# Patient Record
Sex: Male | Born: 1935 | Race: White | Hispanic: No | Marital: Married | Smoking: Former smoker
Health system: Southern US, Community
[De-identification: ages and names within clinical notes are randomized; demographics above are authoritative.]

## PROBLEM LIST (undated history)

## (undated) DIAGNOSIS — C801 Malignant (primary) neoplasm, unspecified: Secondary | ICD-10-CM

## (undated) DIAGNOSIS — K2270 Barrett's esophagus without dysplasia: Secondary | ICD-10-CM

## (undated) DIAGNOSIS — M1990 Unspecified osteoarthritis, unspecified site: Secondary | ICD-10-CM

## (undated) DIAGNOSIS — C61 Malignant neoplasm of prostate: Secondary | ICD-10-CM

## (undated) DIAGNOSIS — E119 Type 2 diabetes mellitus without complications: Secondary | ICD-10-CM

## (undated) DIAGNOSIS — Z9289 Personal history of other medical treatment: Secondary | ICD-10-CM

## (undated) DIAGNOSIS — Z8719 Personal history of other diseases of the digestive system: Secondary | ICD-10-CM

## (undated) DIAGNOSIS — Z8711 Personal history of peptic ulcer disease: Secondary | ICD-10-CM

## (undated) DIAGNOSIS — I10 Essential (primary) hypertension: Secondary | ICD-10-CM

## (undated) DIAGNOSIS — K219 Gastro-esophageal reflux disease without esophagitis: Secondary | ICD-10-CM

## (undated) DIAGNOSIS — Z87442 Personal history of urinary calculi: Secondary | ICD-10-CM

## (undated) DIAGNOSIS — Z950 Presence of cardiac pacemaker: Secondary | ICD-10-CM

## (undated) DIAGNOSIS — I2510 Atherosclerotic heart disease of native coronary artery without angina pectoris: Secondary | ICD-10-CM

## (undated) DIAGNOSIS — Z9889 Other specified postprocedural states: Secondary | ICD-10-CM

## (undated) DIAGNOSIS — I4891 Unspecified atrial fibrillation: Secondary | ICD-10-CM

## (undated) HISTORY — PX: TONSILLECTOMY: SUR1361

## (undated) HISTORY — PX: APPENDECTOMY: SHX54

## (undated) HISTORY — DX: Other specified postprocedural states: Z98.890

## (undated) HISTORY — PX: PACEMAKER INSERTION: SHX728

## (undated) HISTORY — DX: Personal history of other medical treatment: Z92.89

---

## 2001-10-20 ENCOUNTER — Ambulatory Visit (HOSPITAL_COMMUNITY): Admission: RE | Admit: 2001-10-20 | Discharge: 2001-10-20 | Payer: Self-pay | Admitting: Family Medicine

## 2001-12-05 ENCOUNTER — Ambulatory Visit (HOSPITAL_COMMUNITY): Admission: RE | Admit: 2001-12-05 | Discharge: 2001-12-05 | Payer: Self-pay | Admitting: Internal Medicine

## 2002-03-31 ENCOUNTER — Other Ambulatory Visit: Admission: RE | Admit: 2002-03-31 | Discharge: 2002-03-31 | Payer: Self-pay | Admitting: General Surgery

## 2004-01-03 ENCOUNTER — Ambulatory Visit (HOSPITAL_COMMUNITY): Admission: RE | Admit: 2004-01-03 | Discharge: 2004-01-03 | Payer: Self-pay | Admitting: Internal Medicine

## 2004-10-04 ENCOUNTER — Ambulatory Visit (HOSPITAL_COMMUNITY): Admission: RE | Admit: 2004-10-04 | Discharge: 2004-10-04 | Payer: Self-pay | Admitting: Family Medicine

## 2006-01-28 ENCOUNTER — Ambulatory Visit: Payer: Self-pay | Admitting: Internal Medicine

## 2006-02-18 ENCOUNTER — Ambulatory Visit (HOSPITAL_COMMUNITY): Admission: RE | Admit: 2006-02-18 | Discharge: 2006-02-20 | Payer: Self-pay | Admitting: Cardiology

## 2006-02-18 HISTORY — PX: CARDIAC CATHETERIZATION: SHX172

## 2006-12-17 ENCOUNTER — Ambulatory Visit (HOSPITAL_COMMUNITY): Admission: RE | Admit: 2006-12-17 | Discharge: 2006-12-17 | Payer: Self-pay | Admitting: Family Medicine

## 2007-02-13 ENCOUNTER — Ambulatory Visit: Payer: Self-pay | Admitting: Internal Medicine

## 2007-02-28 ENCOUNTER — Encounter (INDEPENDENT_AMBULATORY_CARE_PROVIDER_SITE_OTHER): Payer: Self-pay | Admitting: Specialist

## 2007-02-28 ENCOUNTER — Ambulatory Visit: Payer: Self-pay | Admitting: Internal Medicine

## 2007-02-28 ENCOUNTER — Ambulatory Visit (HOSPITAL_COMMUNITY): Admission: RE | Admit: 2007-02-28 | Discharge: 2007-02-28 | Payer: Self-pay | Admitting: Internal Medicine

## 2007-12-12 ENCOUNTER — Emergency Department (HOSPITAL_COMMUNITY): Admission: EM | Admit: 2007-12-12 | Discharge: 2007-12-12 | Payer: Self-pay | Admitting: Emergency Medicine

## 2007-12-13 ENCOUNTER — Ambulatory Visit (HOSPITAL_COMMUNITY): Admission: RE | Admit: 2007-12-13 | Discharge: 2007-12-13 | Payer: Self-pay | Admitting: Emergency Medicine

## 2009-02-28 ENCOUNTER — Ambulatory Visit (HOSPITAL_COMMUNITY): Admission: RE | Admit: 2009-02-28 | Discharge: 2009-02-28 | Payer: Self-pay | Admitting: Family Medicine

## 2010-03-21 ENCOUNTER — Emergency Department (HOSPITAL_COMMUNITY): Admission: EM | Admit: 2010-03-21 | Discharge: 2010-03-21 | Payer: Self-pay | Admitting: Emergency Medicine

## 2010-05-01 ENCOUNTER — Ambulatory Visit (HOSPITAL_COMMUNITY): Admission: RE | Admit: 2010-05-01 | Discharge: 2010-05-01 | Payer: Self-pay | Admitting: Cardiology

## 2010-05-03 ENCOUNTER — Ambulatory Visit (HOSPITAL_COMMUNITY): Admission: RE | Admit: 2010-05-03 | Discharge: 2010-05-03 | Payer: Self-pay | Admitting: Cardiology

## 2010-05-03 ENCOUNTER — Encounter (INDEPENDENT_AMBULATORY_CARE_PROVIDER_SITE_OTHER): Payer: Self-pay | Admitting: Cardiology

## 2010-05-25 ENCOUNTER — Encounter (INDEPENDENT_AMBULATORY_CARE_PROVIDER_SITE_OTHER): Payer: Self-pay | Admitting: Cardiology

## 2010-05-25 ENCOUNTER — Ambulatory Visit (HOSPITAL_COMMUNITY): Admission: RE | Admit: 2010-05-25 | Discharge: 2010-05-25 | Payer: Self-pay | Admitting: Cardiology

## 2010-09-05 ENCOUNTER — Ambulatory Visit (HOSPITAL_COMMUNITY): Admission: RE | Admit: 2010-09-05 | Discharge: 2010-09-05 | Payer: Self-pay | Admitting: Cardiology

## 2010-09-05 ENCOUNTER — Encounter (INDEPENDENT_AMBULATORY_CARE_PROVIDER_SITE_OTHER): Payer: Self-pay | Admitting: Cardiology

## 2010-09-05 DIAGNOSIS — Z9289 Personal history of other medical treatment: Secondary | ICD-10-CM

## 2010-09-05 HISTORY — DX: Personal history of other medical treatment: Z92.89

## 2011-01-16 LAB — PROTIME-INR
INR: 2.06 — ABNORMAL HIGH (ref 0.00–1.49)
Prothrombin Time: 23.4 seconds — ABNORMAL HIGH (ref 11.6–15.2)

## 2011-01-22 LAB — DIGOXIN LEVEL: Digoxin Level: 0.5 ng/mL — ABNORMAL LOW (ref 0.8–2.0)

## 2011-01-22 LAB — CBC
HCT: 43.7 % (ref 39.0–52.0)
Hemoglobin: 15.1 g/dL (ref 13.0–17.0)
MCHC: 34.5 g/dL (ref 30.0–36.0)
MCV: 89.5 fL (ref 78.0–100.0)
Platelets: 159 10*3/uL (ref 150–400)
RBC: 4.88 MIL/uL (ref 4.22–5.81)
RDW: 13.7 % (ref 11.5–15.5)
WBC: 10.1 10*3/uL (ref 4.0–10.5)

## 2011-01-22 LAB — DIFFERENTIAL
Basophils Absolute: 0 10*3/uL (ref 0.0–0.1)
Basophils Relative: 0 % (ref 0–1)
Eosinophils Absolute: 0.1 10*3/uL (ref 0.0–0.7)
Eosinophils Relative: 1 % (ref 0–5)
Lymphocytes Relative: 16 % (ref 12–46)
Lymphs Abs: 1.7 10*3/uL (ref 0.7–4.0)
Monocytes Absolute: 0.6 10*3/uL (ref 0.1–1.0)
Monocytes Relative: 6 % (ref 3–12)
Neutro Abs: 7.7 10*3/uL (ref 1.7–7.7)
Neutrophils Relative %: 76 % (ref 43–77)

## 2011-01-22 LAB — GLUCOSE, CAPILLARY: Glucose-Capillary: 138 mg/dL — ABNORMAL HIGH (ref 70–99)

## 2011-01-22 LAB — BASIC METABOLIC PANEL
BUN: 19 mg/dL (ref 6–23)
CO2: 29 mEq/L (ref 19–32)
Calcium: 9 mg/dL (ref 8.4–10.5)
Chloride: 104 mEq/L (ref 96–112)
Creatinine, Ser: 1.2 mg/dL (ref 0.4–1.5)
GFR calc Af Amer: >60 mL/min (ref >60)
GFR calc non Af Amer: 59 mL/min — ABNORMAL LOW (ref >60)
Glucose, Bld: 121 mg/dL — ABNORMAL HIGH (ref 70–99)
Potassium: 4 mEq/L (ref 3.5–5.1)
Sodium: 139 mEq/L (ref 135–145)

## 2011-03-23 NOTE — Op Note (Signed)
NAME:  [name], [name]               ACCOUNT NO.:  [id number]   MEDICAL RECORD NO.:  [id number]          PATIENT TYPE:  OIB   LOCATION:  6532                         FACILITY:  [facility]   PHYSICIAN:  Richard A. Alanda Amass, M.D.DATE OF BIRTH:  11/27/1933   DATE OF PROCEDURE:  02/19/2006  DATE OF DISCHARGE:                                 OPERATIVE REPORT   PROCEDURE:  Implantation of permanent DDDR A-V Universal Adapta, ADDR01; SN  [id number] pulse generator was steroid eluting passive fixation tine, in-  line coaxial bipolar IS1 Medtronic atrial and ventricular electrodes.   IMPLANTING PHYSICIAN:  Richard A. Alanda Amass, M.D.   COMPLICATIONS:  None.   ANESTHESIA:  5 mg Valium p.o. premedication, 1% local Xylocaine, Versed 3 mg  IV in divided doses, fentanyl 25 mcg IV during procedure.   ESTIMATED BLOOD LOSS:  Approximately 30 mL.   PREOP ANTIBIOTIC PROPHYLAXIS:  Ancef 1 g IV to be continued postoperatively.   Ventricular electrode:  Medtronic number [id number] - 58 cm;  SN [id number]   Atrial electrode:  Medtronic number [id number] - 53 cm; SN  [id number].   PREOPERATIVE DIAGNOSIS:  1.  Sick sinus syndrome - paroxysmal atrial fibrillation with rapid      ventricular response requiring medical therapy.  2.  Symptomatic bradycardia with documented sinus bradycardia and 30s      episodic dizziness.  Episodic dizziness and lightheadedness.  3.  Systemic hypertension.  4.  GERD.  5.  Erectile dysfunction in past on medical therapy.  6.  Essentially normal coronary arteries with minor 30% LAD lesion at normal      LV function.  EF 65% at catheterization 02/18/2006 (see report).   POSTOPERATIVE DIAGNOSIS:  1.  Sick sinus syndrome - paroxysmal atrial fibrillation with rapid      ventricular response requiring medical therapy.  2.  Symptomatic bradycardia with documented sinus bradycardia and 30s      episodic dizziness.  Episodic dizziness and lightheadedness.  3.  Systemic hypertension.  4.   GERD.  5.  Erectile dysfunction in past on medical therapy.  6.  Essentially normal coronary arteries with minor 30% LAD lesion at normal      LV function.  EF 65% at catheterization 02/18/2006 (see report).   The bipolar atrial threshold:  P equal 5.1 mV, impedance 556 ohms, threshold  0.5 V at 0.5 milliseconds.   Unipolar atrial:  P equal 5.8 mV, impedance 430 ohms, threshold 0.3 V at 0.5  milliseconds.   Bipolar ventricular:  R equal 21 mV, impedance 727 ohms, threshold 0.5 V at  0.5 milliseconds.   Unipolar ventricular:  R equal 12.3 mV, impedance of 578 ohms, threshold 0.2  V at 0.5 milliseconds.   This generator has managed ventricular pacing with switching from a AAI to  DDDR.  In order to avoid pacer induced LBBB and preserved intact AV  conduction.   Magnet rate at __________ equals 85, at EOI magnet rate drops to 65 with  reversion to VVI mode.   PROCEDURE:  The patient brought to second floor CP lab in postabsorptive  state after 5 mg  Valium p.o. premedication.  He was given 1 g Ancef IV as  antibiotic prophylaxis which will be continued postoperative.  Informed  consent was obtained to proceed with pacemaker implant after full discussion  of risks, benefits and alternatives with the patient and his son and  granddaughter.  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  He is right-handed.   The left anterior chest was prepped, draped in the usual manner. 1%  Xylocaine was used for local anesthesia. A left infraclavicular curvilinear  transverse incision was performed and brought down to prepectoral fascia  using electrocautery and blunt dissection. control of hemostasis.  Post  generator pocket was then formed using blunt dissection.  Subclavian vein  was entered with single anterior puncture using 18 thin-wall needle in the  extrathoracic portion under fluoroscopic control.  Using two #9 peel-away  Cook introducers with a retained  stainless steel J-tip guidewire, the  electrodes were inserted in tandem and the sheath peeled away.  The RV  electrode was positioned in the RV apex inferiorly and was stable.  The  atrial electrode was positioned in the right atrial appendage confirmed by  fluoroscopy and rotational maneuvers.  The unipolar bipolar threshold  testing was performed.  The electrodes was secured at insertion site with  previously placed #1 figure-of-eight silk suture around tissue sewing collar  to prevent migration and control hemostasis.  They were further secured with  three #1 interrupted silk sutures around Silastic sewing collar for each  electrode.  Pocket was irrigated with 500 mg Kanamycin solution.  The sponge  count was correct.  Generator was hooked to the electrodes in the proper AV  sequence identified by serial numbers with a single hex nut tightened for  each electrode.  Generator was delivered at the pocket with electrodes  looped behind and loosely secured at the underlying muscle fascia with #1  silk suture to prevent migration.  Subcutaneous tissue closed with two  separate running layers of 2-0 Vicryl suture and skin was closed 5-0  subcuticular Vicryl suture.  The  patient was transferred to holding area in stable condition.  He tolerated  procedure well and remained in sinus that throughout the procedure with  noninvasive blood pressure monitoring and blood pressures 120s to 130s  systolic throughout. See program sheet for postoperative programming.      Richard A. Alanda Amass, M.D.  Electronically Signed     RAW/MEDQ  D:  02/19/2006  T:  02/20/2006  Job:  [id number]   cc:   Madaline Savage, M.D.  Fax: [phone]   Patrica Duel, M.D.  Fax: [phone]   CP lab   Pacer lab, Dr. Kandis Cocking ofc

## 2011-03-23 NOTE — Op Note (Signed)
NAME:  [name], [name]                         ACCOUNT NO.:  [id number]   MEDICAL RECORD NO.:  [id number]                   PATIENT TYPE:  AMB   LOCATION:  DAY                                  FACILITY:  [facility]   PHYSICIAN:  R. Roetta Sessions, M.D.              DATE OF BIRTH:  [birth year]   DATE OF PROCEDURE:  01/03/2004  DATE OF DISCHARGE:                                 OPERATIVE REPORT   INDICATIONS FOR PROCEDURE:  Patient is a 77 year old gentleman with  longstanding gastroesophageal reflux disease and known Barrett's esophagus.  He has a history of ulcerative reflux esophagitis with his last EGD in 2001.  He is here for surveillance.  He is not having any dysphagia, reflux  symptoms.  He is well controlled on Protonix 40 mg daily.  Please see my  dictated H&P of December 27, 2003 for more information.   PROCEDURE:  Excellent O2 saturation, pulses, respirations were monitored  throughout the procedure.  Conscious sedation with Versed 3 mg IV, Demerol  75 mg IV in divided doses.  Cetacaine spray for topical oropharyngeal  anesthesia.  The instrument is an Olympus GIF-140 gastroscope.   FINDINGS:  Examination of the tubular esophagus again revealed the colon  change from salmon-colored epithelium to pearly gray normal esophageal  mucosa.  At 26 cm from the incisors, the salmon-colored epithelium extended  all the way down to the anatomic GE junction, approximately 36-38 cm.  It  appeared to be a flattened, ulcerated area, or least an area of __________  from the columnar epithelium at the distal esophagus.  Please see photos.  There was no obvious neoplasm.  The GE junction was easily traversed.   STOMACH:  Gastric cavity was emptied and insufflated with air.  Thorough  examination of the gastric mucosa  included a retroflex view of the proximal  stomach and gastroesophageal junction demonstrated a moderate-to-large size  hiatal hernia.  The gastric mucosa otherwise appeared normal.   The pylorus  was patent, easily traversed.   DUODENUM:  Examination of the bulb segment portion revealed no  abnormalities.   THERAPIES/DIAGNOSTIC MANEUVERS PERFORMED:  Segmental biopsies from 36-37 cm  all the way up to 26 cm were taken for histologic study.  The patient  tolerated the procedure well and was reactive.   IMPRESSION:  1. Barrett's esophagus involving a good one-half of the tubular esophagus     with some superficial ulceration versus sparing of the distal esophagus,     as described above, along with segmental biopsies of the remainder of the     esophagus.  2. A moderately large hiatal hernia.  3. Otherwise normal stomach, normal D1, D2.   RECOMMENDATIONS:  1. Continue Protonix 40 mg orally daily.  2. Further recommendations to follow pending review of the biopsy report.      ___________________________________________  Jonathon Bellows, M.D.   RMR/MEDQ  D:  01/03/2004  T:  01/03/2004  Job:  [id number]   cc:   Patrica Duel, M.D.  [address], [address]  [place]  [place] [address]  Fax: [phone]

## 2011-03-23 NOTE — H&P (Signed)
NAME:  KYAL, ARTS               ACCOUNT NO.:  [id number]   MEDICAL RECORD NO.:  [id number]          PATIENT TYPE:  AMB   LOCATION:  DAY                           FACILITY:  [facility]   PHYSICIAN:  R. Roetta Sessions, M.D. DATE OF BIRTH:  1933/12/13   DATE OF ADMISSION:  DATE OF DISCHARGE:  LH                              HISTORY & PHYSICAL   CHIEF COMPLAINT:  Barrett's esophagus, needs surveillance.   Mr. Danilo Cappiello is a pleasant 77 year old Caucasian male, well  established Barrett's esophagus/GERD, last seen January 28, 2006. His last  EGD was December 26, 2003.  He is found to have histology-confirmed  Barrett's esophagus without dysplasia.  He has done well without any  odynophagia or dysphagia.  Pantoprazole 40 mg orally daily has continued  to control his reflux symptoms very well.  He had some problems with  sick sinus syndrome last year, for which he ultimately underwent a  pacemaker placement.  He is doing well from a cardiac  standpoint/colonoscopy for screening purposes, was in January 2003.  He  has been found to have only a left-sided diverticulosis.  He has not had  any abdominal pain or melena.   He was actually gained 6 pounds since his 2007 office visit here with  [place].   PAST MEDICAL HISTORY:  Atrial fibrillation.  He is now taking Sotalol.  He is also taking Lanoxin.  Other medical problems include gout,  hypertension, GERD, Barrett's esophagus.   CURRENT MEDICATIONS:  1. Lanoxin 0.125 mg daily.  2. Protonix 40 mg daily.  3. Sotalol.  4. Betapace 240 mg daily.  5. Hydrochloride 12.5 mg daily.  6. ASA 81 mg daily.  7. Clarinex 5 daily.  8. Potassium supplement daily.  9. Lamisil 250 mg daily.   ALLERGIES:  NO KNOWN DRUG ALLERGIES.   FAMILY HISTORY:  Negative chronic GI or liver illness.   SOCIAL HISTORY:  The patient is married for 47 years.  She has three  children.  She is retired from [employer]  No tobacco or alcohol.   REVIEW OF SYSTEMS:  No recent  chest pain, no dyspnea some weight gain as  outlined above.   PHYSICAL EXAMINATION:  GENERAL:  Pleasant 77 year old gentleman, weight  193, height 5 feet 7, temp 97.4, BP 130/88, pulse 80.  SKIN:  Warm and dry.  There is no jaundice.  HEENT:  No scleral icterus.  Conjunctivae are pink.  CHEST:  Lungs clear to auscultation.  Regular rate and rhythm without  murmur, gallop rub.  ABDOMEN:  Nondistended, positive bowel sounds, soft, nontender without  appreciable mass or organomegaly.  EXTREMITIES:  No edema.   IMPRESSION:  Mr. Aleksandr Pellow is a very pleasant 77 year old gentleman  with a history of long-segment Barrett's esophagus.  Last examination  was 3 years ago.  He needs to have surveillance at this time.  This  approach has discussed with the patient at length.  Potential risks,  benefits and alternatives have been reviewed, questions answered, is  agreeable.  Will plan to perform an EGD with surveillance with biopsies  in the very near  future.  Further recommendations to follow.      Jonathon Bellows, M.D.  Electronically Signed     RMR/MEDQ  D:  02/13/2007  T:  02/13/2007  Job:  [id number]   cc:   Patrica Duel, M.D.  Fax: [phone]

## 2011-03-23 NOTE — H&P (Signed)
NAME:  Wayne, Oconnor NO.:  [id number]   MEDICAL RECORD NO.:  [id number]                  PATIENT TYPE:   LOCATION:                                       FACILITY:   PHYSICIAN:  R. Roetta Sessions, M.D.              DATE OF BIRTH:  June 24, 1934   DATE OF ADMISSION:  DATE OF DISCHARGE:                                HISTORY & PHYSICAL   CHIEF COMPLAINT:  History of GERD, ulcerative reflux esophagitis, Barrett's  esophagus.   [name] is a 77 year old gentleman with longstanding  gastroesophageal reflux disease now well controlled on Protonix 40 mg orally  daily.  He underwent an EGD for the above-mentioned symptoms back in January  2001.  He was found to have ulcerative reflux esophagitis with Barrett's  esophagus involving a good two-thirds of the tubular esophagus.  He did have  a stricture which was dilated with a Maloney dilator.  He has had no further  dysphagia symptoms.  He has really done well.  He does have indigestion  occasionally if he eats late at night (i.e. 8 p.m. or later).  No abdominal  pain or early satiety.  No melena or rectal bleeding.  He had a colonoscopy  back in January 2003 as well.  He was also found to have some internal  hemorrhoids and left-sided diverticula.  There is no family history of  colorectal neoplasia.  He has had no intercurrent medical illnesses or  hospitalizations.   PAST MEDICAL HISTORY:  1. Significant for what sounds like atrial fibrillation for which he takes     Lanoxin.  2. Gout.  3. History of hypertension.  4. GERD.   CURRENT MEDICATIONS:  1. Lanoxin 0.125 mg daily.  2. Norvasc 2.5 mg daily.  3. Protonix 40 mg daily.   ALLERGIES:  No known drug allergies.   FAMILY HISTORY:  Negative for chronic GI or liver illness.   SOCIAL HISTORY:  The patient has been married for 44 years, has three  children; he is retired.  No tobacco, no alcohol.   REVIEW OF SYSTEMS:  No chest pain or  dyspnea.  Weight is actually down 2  pounds to 192 from his last visit on December 30, 2002.   PHYSICAL EXAMINATION:  GENERAL:  Reveals a pleasant 77 year old gentleman  who appears at his baseline.  VITAL SIGNS:  Weight 192, blood pressure 132/72, pulse 50.  SKIN:  Warm and dry.  No jaundice, no cutaneous stigmata of chronic liver  disease.  HEENT:  No scleral icterus, conjunctivae are pink.  Oral cavity with no  lesions.  JVD is not prominent.  CHEST:  Lungs are clear to auscultation.  CARDIAC:  Regular rate and rhythm without murmur, gallop, rub.  ABDOMEN:  Nondistended, positive bowel sounds, soft, nontender, without  appreciable mass or organomegaly.  EXTREMITIES:  No edema.   IMPRESSION:  [name] is a pleasant 77 year old  gentleman with  complicated gastroesophageal reflux disease including history of ulcerative  reflux esophagitis and significant degree of Barrett's esophagus involving  the distal two-thirds of the tubular esophagus.  It is time for surveillance  examination.  I have discussed this approach with [name].  We reviewed the  approach of repeat EGD with esophageal biopsies.  The potential risks,  benefits, alternatives have been reviewed, questions answered; he is  agreeable.  I have asked him to try to lose 10 pounds.  This would  facilitate management of his gastroesophageal reflux disease and he should  not be eating as late in the evening as he has been doing recently.  Will  make further recommendations after EGD has been performed.     ___________________________________________                                         Jonathon Bellows, M.D.   RMR/MEDQ  D:  12/27/2003  T:  12/27/2003  Job:  [id number]   cc:   Patrica Duel, M.D.  [address]  [place]  [place] [address]  Fax: [phone]

## 2011-03-23 NOTE — Discharge Summary (Signed)
[name], [name]               ACCOUNT NO.:  [id number]   MEDICAL RECORD NO.:  [id number]          PATIENT TYPE:  OIB   LOCATION:  6532                         FACILITY:  [facility]   PHYSICIAN:  Madaline Savage, M.D.DATE OF BIRTH:  1934/02/29   DATE OF ADMISSION:  02/18/2006  DATE OF DISCHARGE:  02/20/2006                                 DISCHARGE SUMMARY   DISCHARGE DIAGNOSES:  1.  Symptomatic tachycardia.  2.  Paroxysmal atrial fibrillation and bradycardia.  3.  Mild coronary disease at catheterization with a 30-40% napkin-ring      stenosis in the proximal left anterior descending on catheterization      this admission.  4.  Normal left ventricular function.  5.  Treated hypertension.   HOSPITAL COURSE:  The patient is a 77 year old male followed by Dr. Elsie Lincoln  and Dr. Nobie Putnam with a history of tachycardic palpitations and episodic PAF  documented by Holter monitor.  He also had marked bradycardia.  He was  admitted February 18, 2006 for further evaluation.  He had an outpatient  Cardiolite study that apparently was abnormal, although I do not have those  results with me.  He was seen by Dr. Elsie Lincoln February 11, 2006 and then set up  for elective catheterization February 18, 2006.  This was done by Dr. Jacinto Halim.  Catheterization showed a small RCA, dominant circumflex and normal LAD with  the 30-40% napkin-ring stenosis proximal portion with good LV function.  Renal arteries and iliacs were normal.  There was no aortic pullback  gradient.  The patient was set up for elective pacemaker implant which was  done on February 19, 2006 by Dr. Alanda Amass.  The patient had a Medtronic device  implanted.  He is in DDDR.  He was put back on beta-blocker and Lanoxin and  started on Coumadin.  We will see him for a wound check in a week in  [place] and then one month by Dr. Elsie Lincoln.   DISCHARGE MEDICATIONS:  1.  Norvasc 2.5 mg a day.  2.  Protonix 40 mg a day.  3.  Hydrochlorothiazide 12.5 mg a  day.  4.  Clarinex 5 mg a day.  5.  Lanoxin 0.125 mg a day.  6.  Toprol XL 25 mg a day.  7.  Coumadin 5 mg a day or as directed.   LABS:  White count 9.1, hemoglobin 15.2, hematocrit 44.2, platelets 224.  Sodium 132, potassium 3.4, BUN 11, creatinine 1.2, glucose 100.  Liver  functions are normal.  TSH was 2.28.  CEA 1.5.  Uric acid 7.1.  Digoxin  level less than 0.2.  INR 1.0.   CHEST X-RAY:  Pending at discharge.   DISPOSITION:  The patient discharged in stable condition and will follow up  with Dr. Elsie Lincoln and Dr. Alanda Amass.      Abelino Derrick, P.A.    ______________________________  Madaline Savage, M.D.    Lenard Lance  D:  02/20/2006  T:  02/20/2006  Job:  [id number]   cc:   Patrica Duel, M.D.  Fax: [phone]

## 2011-03-23 NOTE — Op Note (Signed)
NAME:  [name], [name]               ACCOUNT NO.:  [id number]   MEDICAL RECORD NO.:  [id number]          PATIENT TYPE:  AMB   LOCATION:  DAY                           FACILITY:  [facility]   PHYSICIAN:  R. Roetta Sessions, M.D. DATE OF BIRTH:  1934/05/29   DATE OF PROCEDURE:  02/28/2007  DATE OF DISCHARGE:                               OPERATIVE REPORT   PROCEDURE:  Esophagogastroduodenoscopy with segmental biopsies of  Barrett's.   INDICATIONS FOR PROCEDURE:  The patient is a 72-year longstanding with  Barrett's, last seen 3 years ago.  His reflux symptoms are well-  controlled on Protonix 40 mg orally daily.  EGD is now being done for  surveillance purposes.  This approach has been discussed with the  patient at length previously.  Potential risks, benefits and  alternatives have been reviewed, questions answered.  He is agreeable.  Please see documentation in the medical record.   PROCEDURE NOTE:  O2 saturation, blood pressure, pulse and respirations  were monitored throughout the entire procedure.  Conscious sedation:  Demerol 75 mg IV, Versed 3 mg IV in divided doses.  Cetacaine spray for  topical pharyngeal anesthesia.  Instrument:  Pentax video chip system.   FINDINGS:  Examination of the tubular esophagus again revealed salmon-  colored epithelium coming up in tongues as proximally as 25 cm from  the incisors today and all the way down to the anatomic EG junction, 33  cm.  There was a bit of a prominent vascular pattern in a couple of  areas in the area of salmon-colored epithelium, but there was no  evidence of neoplasia.  There was no esophagitis.  EG junction was  easily traversed.   Stomach:  The gastric cavity was empty and insufflated well with air.  A  thorough examination of the gastric mucosa including retroflexion of the  proximal stomach and esophagogastric junction demonstrated only a  moderate-sized hiatal hernia.  Gastric mucosa appeared normal.  Pylorus  patent and  easily traversed.  Examination of the bulb and second portion  revealed no abnormalities.   Therapeutic/diagnostic maneuvers performed:  Biopsies of the proximal,  mid and distal Barrett's segments were taken for histologic study.  The  patient tolerated the procedure well and was reacted in endoscopy.   IMPRESSION:  1. Barrett's esophagus as described above, essentially unchanged prior      exam.  Segmental biopsies taken.  2. Moderate-size hiatal hernia.  3. Otherwise normal stomach, D1, D2.   RECOMMENDATIONS:  1. Continue Protonix 40 mg orally daily.  2. Follow up on pathology.  3. Further recommendations to follow.      Jonathon Bellows, M.D.  Electronically Signed     RMR/MEDQ  D:  02/28/2007  T:  02/28/2007  Job:  [id number]   cc:   Patrica Duel, M.D.  Fax: [phone]

## 2011-03-23 NOTE — Op Note (Signed)
[facility]  Patient:    [name], [name] Visit Number: [id number] MRN: [id number]          Service Type: END Location: DAY Attending Physician:  Jonathon Bellows Dictated by:   Roetta Sessions, M.D. Proc. Date: 12/05/01 Admit Date:  12/05/2001                             Operative Report  PROCEDURE:  Esophagogastroduodenoscopy with Elease Hashimoto dilation followed by biopsy and screening colonoscopy,  GASTROENTEROLOGIST:  Roetta Sessions, M.D.  INDICATIONS:  The patient is a 77 year old gentleman with esophageal dysphagia, longstanding gastroesophageal reflux disease, in need of screening colonoscopy as well.  EGD and colonoscopy are now being done to further evaluate his symptoms.  This approach has been discussed with the patient previously.  Potential risks, benefits, and alternatives have been reviewed, but the patient put this procedure off until today because he was sick with the flu.  I feel he is at low risk for conscious sedation with Versed and Demerol.  PROCEDURE NOTE:  O2 saturation, blood pressure, and pulse for this patient were monitored throughout the entirety of both procedures.  CONSCIOUS SEDATION:  IV Versed and Demerol in incremental doses. INSTRUMENT:  Olympus video chip gastroscope and colonoscope.  EGD FINDINGS:   Examination of the tubular esophagus revealed and area of geographic ulceration with associated benign peptic-appearing stricture which started at 25 cm from the incisors.  Proximal to this area of ulceration, there was seen color change with the salmon-colored epithelium being confluently present from this circumferential ulceration all the way down to the EG junction consistent with Barretts esophagus.  This, again, appeared to be a benign lesion.  The scope was negotiated across the stricture without much difficulty.  STOMACH:  Gastric cavity was emptied and insufflated well with air.  Thorough examination of gastric  mucosa including retroflexed view of the proximal stomach and esophagogastric junction demonstrated a moderate size hiatal hernia.  Pylorus was patent and easily traversed.  DUODENUM:  Bulb and second portion appeared normal.  THERAPEUTIC/DIAGNOSTIC MANEUVERS PERFORMED:  A 54-French Maloney dilator was passed to full insertion with minimal resistance.  A look back revealed blood at the level of the stricture; however, no apparent complications. Subsequently, biopsies of the stricture, middle biopsies then more distal tubular esophagus were taken for histologic study.  The patient tolerated the procedure well and was prepared for colonoscopy.  COLONOSCOPY FINDINGS:  Digital rectal examination revealed no abnormalities.  ENDOSCOPIC FINDINGS:  Prep was adequate.  Rectum:  Examination of rectal mucosa including retroflexed view of the anal verge revealed no abnormalities.  COLON:  Colonic mucosa was surveyed from the rectosigmoid junction through the level of the transverse right colon to the area of the appendiceal orifice, ileocecal valve, and cecum.  These structures were well seen and photographed.  The only abnormalities noted were left-sided diverticulum.  The remainder of the colonic mucosa appeared normal.  From the level of the cecum and ileocecal valve (see photos), the scope was slowly and cautiously withdrawn.  All previously mentioned mucosal surfaces were again seen and, again, no abnormalities were observed.  The patient tolerated both procedures well and was reacted in endoscopy.  ESOPHAGOGASTRODUODENOSCOPY IMPRESSION: 1. Significant ulcerative reflux esophagitis.  The esophagitis involves the    proximal esophagus with distinct color change at this level consistent with    Barretts esophagus involving the distal two-thirds of the tubular    esophagus.  Biopsies were taken. 2. Status post dilation as described above. 3. Moderate size hiatal hernia. 4. The remainder of  upper gastrointestinal tract appeared normal.  COLONOSCOPY IMPRESSION: 1. Normal rectum. 2. Left-sided diverticulum. 3. The remainder of the colonic mucosa appeared normal.  RECOMMENDATIONS: 1. The patient is given prescription for Protonix.  He is to start taking    Protonix 40 mg orally twice daily for the next month and then drop back to    once daily thereafter. 2. Antireflux measure. 3. Follow up on pathology. 4. Diverticulosis literature provided to the patient. 5. Further recommendations to follow. Dictated by:   Roetta Sessions, M.D. Attending Physician:  Jonathon Bellows DD:  12/05/01 TD:  12/06/01 Job: [id number]

## 2011-03-23 NOTE — Cardiovascular Report (Signed)
[name], [name]               ACCOUNT NO.:  [id number]   MEDICAL RECORD NO.:  [id number]          PATIENT TYPE:  OIB   LOCATION:  2807                         FACILITY:  [facility]   PHYSICIAN:  Cristy Hilts. Jacinto Halim, MD       DATE OF BIRTH:  [birth year]   DATE OF PROCEDURE:  02/18/2006  DATE OF DISCHARGE:                              CARDIAC CATHETERIZATION   ATTENDING CARDIOLOGIST:  Madaline Savage, M.D.   REFERRING PHYSICIAN:  Patrica Duel, M.D.   PROCEDURES PERFORMED:  1.  Left ventriculography.  2.  Selective right and left coronary arteriography.  3.  Abdominal aortogram.   INDICATIONS:  [name] is a 77 year old gentleman with a history of  hypertension, who has been complaining of palpitations.  He has been having  atrial tachycardia in the form of atrial fibrillation in the range of 140-  160 beats per minute.  On even minimal doses of beta blockers, he has been  having significant bradycardic episodes.  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  Abdominal aortogram was performed to  evaluate for abdominal atherosclerosis and renal artery stenosis as he has  hypertension.   HEMODYNAMIC DATA:  The left ventricular pressure was 126/19 with an end-  diastolic pressure of 20 mmHg.  The aortic pressure was 126/58 with a mean  of 81 mmHg.  There was no significant pressure gradient across the aortic  valve.   ANGIOGRAPHIC DATA:  1.  Left ventricle:  Left ventricular systolic function was normal.  The      ejection fraction was estimated at 65%.  There was no significant mitral      regurgitation.  2.  Right coronary artery:  The right coronary artery is a nondominant      vessel.  3.  Left main:  The left main is a large-caliber vessel and is normal.  4.  Circumflex:  The circumflex is large-caliber vessel.   It is      superdominant.  It gives origin to a small AV groove branch and then      large PDA from its distal end and several small PLA branches.  It also      gives origin to a large obtuse marginal #1.  It is smooth and normal.  5.  LAD:  The LAD is a large-caliber vessel.  It has a smooth napkin ring-      like 30, at most 40% stenosis in its proximal segment but is otherwise      smooth and normal.  It gives origin to a moderate-sized diagonal #1.   ABDOMINAL AORTOGRAM:  The abdominal aortogram revealed the presence of two  renal arteries, one on either side.  They were widely patent.  The  aortoiliac bifurcation was widely patent.   IMPRESSION:  1.  Normal left ventricular systolic function, ejection fraction 65%.  2.  Except for a focal napkin ring-like lesion into the proximal LAD, the  coronary arteries are normal.  3.  Superdominant left circumflex coronary artery.   RECOMMENDATIONS:  Given significant bradycardic episodes even on very  minimal dose of beta blockers, it will be difficult to control the heart  rate of atrial fibrillation with rapid ventricular response.  Given this, he  would benefit from permanent transvenous pacemaker implantation. He is being  set up for the same at 11 o'clock in the morning.   A total of 80 mL of contrast was utilized for diagnostic angiography.   TECHNIQUE OF THE PROCEDURE:  Under the usual sterile precaution using a 6  French right femoral artery access, a 6 [place] multipurpose B2 catheter was  advanced to the ascending aorta over a 0.035 inch J-wire.  The catheter was  gently advanced into the left ventricle and left ventricular pressure  monitored.  Hand contrast injection of the left ventricle was performed both  in LAO and RAO projection.  The catheter was flushed with saline and pulled  back into the ascending aorta and the pressure gradient across the aortic  valve was monitored.  The right coronary artery was selectively  engaged and  angiography was performed.  Then the left main coronary artery was  selectively engaged and angiography was performed.  Then the catheter was  pulled back into the abdominal aorta and abdominal aortogram was performed.  Then the catheter was pulled out of the body in the usual fashion.  The  patient tolerated the procedure well.  No immediate complication noted.      Cristy Hilts. Jacinto Halim, MD  Electronically Signed     JRG/MEDQ  D:  02/18/2006  T:  02/19/2006  Job:  [id number]   cc:   Madaline Savage, M.D.  Fax: [phone]   Patrica Duel, M.D.  Fax: [phone]

## 2011-05-17 ENCOUNTER — Inpatient Hospital Stay (HOSPITAL_COMMUNITY)
Admission: EM | Admit: 2011-05-17 | Discharge: 2011-05-21 | DRG: 194 | Disposition: A | Discharge status: Home / Self Care | Payer: Medicare Other | Attending: Internal Medicine | Admitting: Internal Medicine

## 2011-05-17 ENCOUNTER — Emergency Department (HOSPITAL_COMMUNITY): Payer: Medicare Other

## 2011-05-17 ENCOUNTER — Other Ambulatory Visit: Payer: Self-pay

## 2011-05-17 DIAGNOSIS — K2270 Barrett's esophagus without dysplasia: Secondary | ICD-10-CM | POA: Diagnosis present

## 2011-05-17 DIAGNOSIS — J189 Pneumonia, unspecified organism: Principal | ICD-10-CM | POA: Diagnosis present

## 2011-05-17 DIAGNOSIS — R509 Fever, unspecified: Secondary | ICD-10-CM | POA: Diagnosis present

## 2011-05-17 DIAGNOSIS — D696 Thrombocytopenia, unspecified: Secondary | ICD-10-CM | POA: Diagnosis not present

## 2011-05-17 DIAGNOSIS — D649 Anemia, unspecified: Secondary | ICD-10-CM | POA: Diagnosis not present

## 2011-05-17 DIAGNOSIS — E872 Acidosis, unspecified: Secondary | ICD-10-CM | POA: Diagnosis present

## 2011-05-17 DIAGNOSIS — I4891 Unspecified atrial fibrillation: Secondary | ICD-10-CM | POA: Diagnosis present

## 2011-05-17 DIAGNOSIS — N179 Acute kidney failure, unspecified: Secondary | ICD-10-CM | POA: Diagnosis present

## 2011-05-17 DIAGNOSIS — R739 Hyperglycemia, unspecified: Secondary | ICD-10-CM | POA: Diagnosis not present

## 2011-05-17 DIAGNOSIS — M1990 Unspecified osteoarthritis, unspecified site: Secondary | ICD-10-CM | POA: Insufficient documentation

## 2011-05-17 DIAGNOSIS — R7309 Other abnormal glucose: Secondary | ICD-10-CM | POA: Diagnosis present

## 2011-05-17 DIAGNOSIS — M109 Gout, unspecified: Secondary | ICD-10-CM | POA: Insufficient documentation

## 2011-05-17 DIAGNOSIS — R001 Bradycardia, unspecified: Secondary | ICD-10-CM | POA: Diagnosis not present

## 2011-05-17 DIAGNOSIS — Z950 Presence of cardiac pacemaker: Secondary | ICD-10-CM

## 2011-05-17 DIAGNOSIS — I10 Essential (primary) hypertension: Secondary | ICD-10-CM | POA: Diagnosis present

## 2011-05-17 DIAGNOSIS — J90 Pleural effusion, not elsewhere classified: Secondary | ICD-10-CM | POA: Diagnosis present

## 2011-05-17 HISTORY — DX: Atherosclerotic heart disease of native coronary artery without angina pectoris: I25.10

## 2011-05-17 HISTORY — DX: Presence of cardiac pacemaker: Z95.0

## 2011-05-17 HISTORY — DX: Unspecified osteoarthritis, unspecified site: M19.90

## 2011-05-17 HISTORY — DX: Unspecified atrial fibrillation: I48.91

## 2011-05-17 HISTORY — DX: Barrett's esophagus without dysplasia: K22.70

## 2011-05-17 HISTORY — DX: Essential (primary) hypertension: I10

## 2011-05-17 LAB — HEPATIC FUNCTION PANEL
AST: 20 U/L (ref 0–37)
Alkaline Phosphatase: 40 U/L (ref 39–117)
Bilirubin, Direct: 0.2 mg/dL (ref 0.0–0.3)
Total Bilirubin: 0.7 mg/dL (ref 0.3–1.2)

## 2011-05-17 LAB — CBC
HCT: 41.5 % (ref 39.0–52.0)
Hemoglobin: 13.6 g/dL (ref 13.0–17.0)
MCH: 29.9 pg (ref 26.0–34.0)
MCHC: 33.1 g/dL (ref 30.0–36.0)
MCHC: 32.8 g/dL (ref 30.0–36.0)
MCV: 91 fL (ref 78.0–100.0)
Platelets: 169 10*3/uL (ref 150–400)
RDW: 13 % (ref 11.5–15.5)
RDW: 13 % (ref 11.5–15.5)

## 2011-05-17 LAB — COMPREHENSIVE METABOLIC PANEL
Albumin: 3.2 g/dL — ABNORMAL LOW (ref 3.5–5.2)
Alkaline Phosphatase: 33 U/L — ABNORMAL LOW (ref 39–117)
BUN: 27 mg/dL — ABNORMAL HIGH (ref 6–23)
Chloride: 102 mEq/L (ref 96–112)
Creatinine, Ser: 1.79 mg/dL — ABNORMAL HIGH (ref 0.50–1.35)
GFR calc Af Amer: 45 mL/min — ABNORMAL LOW (ref >60)
Glucose, Bld: 162 mg/dL — ABNORMAL HIGH (ref 70–99)
Potassium: 4.1 mEq/L (ref 3.5–5.1)
Total Bilirubin: 0.9 mg/dL (ref 0.3–1.2)
Total Protein: 6.6 g/dL (ref 6.0–8.3)

## 2011-05-17 LAB — PROTIME-INR
INR: 1.31 (ref 0.00–1.49)
Prothrombin Time: 15.5 seconds — ABNORMAL HIGH (ref 11.6–15.2)
Prothrombin Time: 16.5 seconds — ABNORMAL HIGH (ref 11.6–15.2)

## 2011-05-17 LAB — DIFFERENTIAL
Basophils Relative: 0 % (ref 0–1)
Basophils Relative: 0 % (ref 0–1)
Eosinophils Absolute: 0 10*3/uL (ref 0.0–0.7)
Eosinophils Absolute: 0 10*3/uL (ref 0.0–0.7)
Monocytes Relative: 8 % (ref 3–12)
Neutro Abs: 9.6 10*3/uL — ABNORMAL HIGH (ref 1.7–7.7)
Neutrophils Relative %: 91 % — ABNORMAL HIGH (ref 43–77)
Neutrophils Relative %: 81 % — ABNORMAL HIGH (ref 43–77)

## 2011-05-17 LAB — BASIC METABOLIC PANEL
GFR calc Af Amer: 52 mL/min — ABNORMAL LOW (ref >60)
GFR calc non Af Amer: 43 mL/min — ABNORMAL LOW (ref >60)
Potassium: 4.1 mEq/L (ref 3.5–5.1)
Sodium: 135 mEq/L (ref 135–145)

## 2011-05-17 LAB — URINALYSIS, ROUTINE W REFLEX MICROSCOPIC
Bilirubin Urine: NEGATIVE
Hgb urine dipstick: NEGATIVE
Nitrite: NEGATIVE
pH: 5.5 (ref 5.0–8.0)

## 2011-05-17 LAB — CK TOTAL AND CKMB (NOT AT ARMC): Total CK: 71 U/L (ref 7–232)

## 2011-05-17 LAB — TROPONIN I: Troponin I: <0.3 ng/mL (ref <0.30)

## 2011-05-17 LAB — PROCALCITONIN: Procalcitonin: 1.23 ng/mL

## 2011-05-17 LAB — APTT: aPTT: 38 seconds — ABNORMAL HIGH (ref 24–37)

## 2011-05-17 LAB — LACTIC ACID, PLASMA: Lactic Acid, Venous: 2.6 mmol/L — ABNORMAL HIGH (ref 0.5–2.2)

## 2011-05-17 MED ORDER — ENOXAPARIN SODIUM 40 MG/0.4ML ~~LOC~~ SOLN
40.0000 mg | SUBCUTANEOUS | Status: DC
  Administered 2011-05-17 – 2011-05-21 (×5): 40 mg via SUBCUTANEOUS
  Filled 2011-05-17 (×5): qty 0.4

## 2011-05-17 MED ORDER — PANTOPRAZOLE SODIUM 40 MG PO TBEC: 40.0000 mg | DELAYED_RELEASE_TABLET | Freq: Every day | ORAL | Status: DC

## 2011-05-17 MED ORDER — POTASSIUM CHLORIDE IN NACL 20-0.9 MEQ/L-% IV SOLN
INTRAVENOUS | Status: DC
  Administered 2011-05-17 – 2011-05-18 (×2): via INTRAVENOUS

## 2011-05-17 MED ORDER — IBUPROFEN 400 MG PO TABS: 400.0000 mg | ORAL_TABLET | Freq: Three times a day (TID) | ORAL | Status: DC | PRN

## 2011-05-17 MED ORDER — FLECAINIDE ACETATE 50 MG PO TABS
150.0000 mg | ORAL_TABLET | Freq: Two times a day (BID) | ORAL | Status: DC
  Administered 2011-05-17 – 2011-05-21 (×9): 150 mg via ORAL
  Filled 2011-05-17 (×13): qty 1

## 2011-05-17 MED ORDER — ALBUTEROL SULFATE (2.5 MG/3ML) 0.083% IN NEBU
2.5000 mg | INHALATION_SOLUTION | Freq: Four times a day (QID) | RESPIRATORY_TRACT | Status: DC
  Administered 2011-05-17 – 2011-05-21 (×15): 2.5 mg via RESPIRATORY_TRACT
  Filled 2011-05-17 (×16): qty 3

## 2011-05-17 MED ORDER — SODIUM CHLORIDE 0.9 % IV BOLUS (SEPSIS)
1000.0000 mL | Freq: Once | INTRAVENOUS | Status: AC
  Administered 2011-05-17: 1000 mL via INTRAVENOUS

## 2011-05-17 MED ORDER — DEXTROSE 5 % IV SOLN
1.0000 g | INTRAVENOUS | Status: DC
  Administered 2011-05-17 – 2011-05-21 (×5): 1 g via INTRAVENOUS
  Filled 2011-05-17 (×6): qty 1

## 2011-05-17 MED ORDER — IBUPROFEN 800 MG PO TABS
800.0000 mg | ORAL_TABLET | Freq: Once | ORAL | Status: AC
  Administered 2011-05-17: 800 mg via ORAL
  Filled 2011-05-17: qty 1

## 2011-05-17 MED ORDER — ASPIRIN 81 MG PO CHEW
162.0000 mg | CHEWABLE_TABLET | Freq: Every day | ORAL | Status: DC
  Administered 2011-05-17 – 2011-05-21 (×5): 162 mg via ORAL
  Filled 2011-05-17 (×5): qty 2

## 2011-05-17 MED ORDER — WARFARIN SODIUM 2.5 MG PO TABS: 2.5000 mg | ORAL_TABLET | ORAL | Status: DC

## 2011-05-17 MED ORDER — ALBUTEROL SULFATE (2.5 MG/3ML) 0.083% IN NEBU
2.5000 mg | INHALATION_SOLUTION | RESPIRATORY_TRACT | Status: DC
  Administered 2011-05-17: 2.5 mg via RESPIRATORY_TRACT

## 2011-05-17 MED ORDER — DIGOXIN 125 MCG PO TABS
125.0000 ug | ORAL_TABLET | Freq: Every day | ORAL | Status: DC
  Administered 2011-05-17 – 2011-05-21 (×5): 125 ug via ORAL
  Filled 2011-05-17 (×5): qty 1

## 2011-05-17 MED ORDER — SODIUM CHLORIDE 0.9 % IV SOLN
INTRAVENOUS | Status: DC
  Administered 2011-05-17: 04:00:00 via INTRAVENOUS

## 2011-05-17 MED ORDER — CEFTRIAXONE SODIUM 1 G IJ SOLR: 1.0000 g | INTRAMUSCULAR | Status: DC

## 2011-05-17 MED ORDER — ALLOPURINOL 100 MG PO TABS
100.0000 mg | ORAL_TABLET | Freq: Every day | ORAL | Status: DC
  Administered 2011-05-17 – 2011-05-21 (×5): 100 mg via ORAL
  Filled 2011-05-17 (×5): qty 1

## 2011-05-17 MED ORDER — DEXTROSE 5 % IV SOLN
500.0000 mg | INTRAVENOUS | Status: DC
  Administered 2011-05-17 – 2011-05-21 (×5): 500 mg via INTRAVENOUS
  Filled 2011-05-17 (×6): qty 500

## 2011-05-17 MED ORDER — IPRATROPIUM BROMIDE 0.02 % IN SOLN
0.5000 mg | RESPIRATORY_TRACT | Status: DC
  Administered 2011-05-17: 0.5 mg via RESPIRATORY_TRACT
  Filled 2011-05-17: qty 2.5

## 2011-05-17 MED ORDER — ACETAMINOPHEN 500 MG PO TABS
1000.0000 mg | ORAL_TABLET | Freq: Once | ORAL | Status: AC
  Administered 2011-05-17: 1000 mg via ORAL
  Filled 2011-05-17: qty 2

## 2011-05-17 MED ORDER — ALBUTEROL SULFATE (2.5 MG/3ML) 0.083% IN NEBU
INHALATION_SOLUTION | RESPIRATORY_TRACT | Status: AC
  Administered 2011-05-17: 2.5 mg via RESPIRATORY_TRACT
  Filled 2011-05-17: qty 3

## 2011-05-17 MED ORDER — MOXIFLOXACIN HCL IN NACL 400 MG/250ML IV SOLN
400.0000 mg | Freq: Once | INTRAVENOUS | Status: AC
  Administered 2011-05-17: 400 mg via INTRAVENOUS
  Filled 2011-05-17: qty 250

## 2011-05-17 MED ORDER — DEXTROSE 5 % IV SOLN
INTRAVENOUS | Status: AC
  Filled 2011-05-17: qty 1

## 2011-05-17 MED ORDER — BENZONATATE 100 MG PO CAPS: 200.0000 mg | ORAL_CAPSULE | Freq: Three times a day (TID) | ORAL | Status: DC | PRN

## 2011-05-17 MED ORDER — DEXTROSE 5 % IV SOLN
INTRAVENOUS | Status: AC
  Filled 2011-05-17: qty 500

## 2011-05-17 NOTE — ED Notes (Signed)
Pt transported to room ICU 7 with no difficulty. Anna RN at bsd when pt arrived.

## 2011-05-17 NOTE — Progress Notes (Signed)
Subjective: Pt feels a little better.  Objective: Vital signs in last 24 hours: Temp:  [97.7 F (36.5 C)-104.5 F (40.3 C)] 97.7 F (36.5 C) (07/12 0800) Pulse Rate:  [59-95] 60  (07/12 0800) Resp:  [16-28] 17  (07/12 0800) BP: (99-131)/(50-71) 131/60 mmHg (07/12 0800) SpO2:  [93 %-97 %] 96 % (07/12 0822) Weight:  [81.6 kg (179 lb 14.3 oz)-81.647 kg (180 lb)] 179 lb 14.3 oz (81.6 kg) (07/12 0455) Weight change:  Last BM Date: 05/17/11  Intake/Output from previous day: 07/11 0701 - 07/12 0700 In: 137.5 [I.V.:137.5] Out: -  Intake/Output this shift: I/O this shift: In: 350 [P.O.:250; I.V.:100] Out: 200 [Urine:200]  General appearance: alert Resp:  Few scattered crackles Cardio: S1, S2 normal  Lab Results:  Basename 05/17/11 0409 05/17/11 0111  WBC 11.8* 9.6  HGB 13.6 14.9  HCT 41.5 45.0  PLT 147* 169   BMET  Basename 05/17/11 0409 05/17/11 0111  NA 137 135  K 4.1 4.1  CL 102 100  CO2 27 24  GLUCOSE 162* 192*  BUN 27* 24*  CREATININE 1.79* 1.58*  CALCIUM 8.9 9.1    Studies/Results: No results found.  Medications: I have reviewed the patient's current medications.  Assessment/Plan: 77 yo w/ hx of A Fib; gout, DJD, admitted for presumed CAP this am. On Avelox, Rocephin, Zithromax...due to fever overnight, will continue double coverage for now. D/c coumadin as Pt is not on it; he is on antiplatelet tx. Hold Metoprolol for now due to mild brady. Increase IVF for ARF; ??pulm edema on CXR. Check HbA1c for hyperglycemia. D/c D5 in IVF. Watch Platelet count. CXR in am.  LOS: 0 days   Charita Lindenberger 05/17/2011, 10:02 AM

## 2011-05-17 NOTE — ED Notes (Signed)
Pt given liquid tylenol en route and pt vomited medication up.

## 2011-05-17 NOTE — ED Notes (Signed)
Family at bedside. 

## 2011-05-17 NOTE — ED Notes (Signed)
Arrived by EMS for c/o lethargic. Fever 102.5 per EMS. 104.5 Rectally upon arrival. Pt has nonprod cough with crackles auscultated in lung fields. Pt normally ambulatory at home and for couple of days has not had strength to perform daily activity as normal. Pt alert and oriented upon arrival. nad noted

## 2011-05-17 NOTE — ED Notes (Signed)
Bed assignment room 7. ICU

## 2011-05-17 NOTE — ED Notes (Signed)
Hospitalist here for admission at this time. Family at bsd and aware of pt being admitted. Pt resting in bed with nad noted. IV bolus infusing as ordered. Will continue to monitor.

## 2011-05-17 NOTE — ED Notes (Signed)
Vital signs stable. 

## 2011-05-17 NOTE — ED Notes (Signed)
Patient is resting comfortably. 

## 2011-05-17 NOTE — ED Provider Notes (Signed)
History     Chief Complaint  Patient presents with  . Fatigue   HPI Comments: vomitted on arrival - found to be very febrile at home, cougihning and weak. No known sick contacts - baseline is walking and taking care of ADL's.    Patient is a 77 y.o. male presenting with fever.  Fever Primary symptoms of the febrile illness include fever, fatigue, cough, nausea and vomiting. Primary symptoms do not include visual change, headaches, wheezing, shortness of breath, abdominal pain, diarrhea, dysuria, altered mental status, myalgias, arthralgias or rash. The current episode started today. This is a new problem. The problem has been gradually worsening.  The fever began today. The maximum temperature recorded prior to his arrival was 103 to 104 F. The temperature was taken by an oral thermometer.  The cough began today. The cough is new. The cough is productive. There is nondescript sputum produced.    Past Medical History  Diagnosis Date  . Pacemaker   . Gout   . Arthritis     Past Surgical History  Procedure Date  . Pacemaker insertion     No family history on file.  History  Substance Use Topics  . Smoking status: Never Smoker   . Smokeless tobacco: Not on file  . Alcohol Use: No      Review of Systems  Constitutional: Positive for fever and fatigue.  HENT: Negative for hearing loss, ear pain, congestion, facial swelling, rhinorrhea, neck pain, neck stiffness, postnasal drip and ear discharge.   Eyes: Negative for photophobia, discharge and redness.  Respiratory: Positive for cough. Negative for chest tightness, shortness of breath and wheezing.   Cardiovascular: Negative for chest pain, palpitations and leg swelling.  Gastrointestinal: Positive for nausea and vomiting. Negative for abdominal pain and diarrhea.  Genitourinary: Negative for dysuria.  Musculoskeletal: Negative for myalgias, back pain and arthralgias.  Skin: Negative for rash.  Neurological: Negative for  syncope and headaches.  Hematological: Negative for adenopathy.  Psychiatric/Behavioral: Negative for confusion and altered mental status.  All other systems reviewed and are negative.    Physical Exam  BP 130/71  Pulse 62  Temp(Src) 104.5 F (40.3 C) (Rectal)  Resp 28  Ht 5\' 6"  (1.676 m)  Wt 180 lb (81.647 kg)  BMI 29.05 kg/m2  SpO2 95%  Physical Exam  Nursing note and vitals reviewed. Constitutional: He appears well-developed and well-nourished. No distress.  HENT:  Head: Normocephalic and atraumatic.  Mouth/Throat: Oropharynx is clear and moist. No oropharyngeal exudate.  Eyes: Conjunctivae and EOM are normal. Pupils are equal, round, and reactive to light. Right eye exhibits no discharge. Left eye exhibits no discharge. No scleral icterus.  Neck: Normal range of motion. Neck supple. No JVD present. No thyromegaly present.  Cardiovascular: Normal rate, regular rhythm and intact distal pulses.  Exam reveals no gallop and no friction rub.   Murmur heard. Pulmonary/Chest: Effort normal. No respiratory distress. He has no wheezes. He has rales ( bilateral rales at bases).  Abdominal: Soft. Bowel sounds are normal. He exhibits no distension and no mass. There is no tenderness.  Musculoskeletal: Normal range of motion. He exhibits no edema and no tenderness.  Lymphadenopathy:    He has no cervical adenopathy.  Neurological: He is alert. Coordination normal.  Skin: Skin is warm. No rash noted. He is diaphoretic. No erythema.  Psychiatric: He has a normal mood and affect. His behavior is normal.    ED Course  Procedures  MDM Pt has febrile illness that  has caused him to have fatigue and generalized weakness.  He has cough and rales - r/o pna, ua, septic w/u.  IV placed at this time.   Chest x-ray reveals no focal infiltrates, however it does show scattered interstitial edema consistent with mild congestive heart failure.  The patient's blood work shows no leukocytosis, slight  renal insufficiency, dehydration by urinalysis with high specific gravity, and a lactic acid that is slightly elevated. The procalcitonin is elevated to 1.23, however this is in the range of possible systemic infection. I have decided that despite focal infiltrates on the x-ray with significant fever and cough, will treat for community-acquired pneumonia with Avelox 400 mg IV.  ED ECG REPORT   Date: 05/17/2011   Rate: 62  Rhythm: Electronically pace  QRS Axis: left  Intervals: QRS prolonged  ST/T Wave abnormalities: nonspecific ST/T changes  Conduction Disutrbances:left bundle branch block  Narrative Interpretation:   Old EKG Reviewed: still paced, LBBB pattern.  I have discussed the findings with the hospitalist on call, and he has agreed to come and evaluate the patient for admission. Of note the patient's temperature has improved to 102.5, however his low pressure has decreased and currently is 99 systolic. I have added a fluid bolus in addition to his antibiotics.  Vida Roller, MD 05/17/11 [phone]

## 2011-05-17 NOTE — ED Notes (Signed)
Patient denies pain and is resting comfortably.  

## 2011-05-17 NOTE — H&P (Signed)
  [id number] 

## 2011-05-17 NOTE — H&P (Signed)
[name], [name]               ACCOUNT NO.:  [id number]  MEDICAL RECORD NO.:  [id number]  LOCATION:  IC07                          FACILITY:  [facility]  PHYSICIAN:  Talmage Nap, MD  DATE OF BIRTH:  September 18, 1934  DATE OF ADMISSION:  05/17/2011 DATE OF DISCHARGE:  LH                             HISTORY & PHYSICAL   PRIMARY CARE PHYSICIAN:  Unknown.  History obtainable from the patient, patient's spouse and granddaughter.  CHIEF COMPLAINT:  Cough and fever of one day's duration.  The patient is a 77 year old Caucasian male with history of atrial fibrillation, status post pacemaker placement, and gout presenting to the emergency room with 1-day history of cough and fever.  The patient claimed to have been in stable health until a day prior to presenting to the emergency room.  He noticed that he was coughing and cough was nonproductive of sputum.  He denied any associated chest pain. He denied any shortness of breath, but had a feeling of congestion in his lung.  He had fever with chills and rigor.  He had one episode of vomiting, but denied any diarrhea or hematochezia.  The patient also denied any history of PND, orthopnea, but the fever and cough persisted. Hence, the patient presented to the emergency room to be evaluated.  PAST MEDICAL HISTORY:  Positive for: 1. Atrial fibrillation. 2. Gout. 3. Arthritis.  PAST SURGICAL HISTORY:  Atrial fibrillation, status post pacemaker placement.  PREADMISSION MEDICATIONS: 1. Allopurinol 100 mg p.o. daily. 2. Digoxin 0.25 mg p.o. daily. 3. Flecainide (Tambocor) 150 mg p.o. daily. 4. Metoprolol 25 mg p.o. b.i.d. 5. Metoprolol 40 mg p.o. daily. 6. Warfarin (Coumadin) 2.5 mg p.o. daily.  He has no known allergies.  SOCIAL HISTORY:  Negative for alcohol or tobacco use, and the patient is currently retired.  FAMILY HISTORY:  Negative for any coronary artery disease.  REVIEW OF SYSTEMS:  The patient denies any history of headache.   No blurred vision.  Presently, denied any nausea or vomiting.  Complained of cough that is nonproductive of sputum with feeling of congestion in the lungs.  No associated vomiting.  Had fever, chills, and rigor.  He also denied any history of PND or orthopnea.  No abdominal discomfort. No diarrhea or hematochezia.  No dysuria or hematuria.  No swelling of the lower extremities.  No intolerance to heat or cold and no neuropsychiatric disorder.  PHYSICAL EXAMINATION:  GENERAL:  An elderly man not in any  respiratory distress, suboptimal hydration. PRESENT VITAL SIGNS.  Temperature is 104.5, pulse is 52, respiratory rate is 28, initial blood pressure was 99/52, following  IV fluid was 130/71. HEENT:  Mild pallor.  Pupils are reactive to light and extraocular muscles are intact. NECK:  No jugular venous distention.  No carotid bruit.  No lymphadenopathy. CHEST:  Bibasilar crackles. HEART:  Heart sounds 1 and 2.  No murmur. ABDOMEN:  Soft and nontender.  Liver, spleen, and kidney not palpable. Bowel sounds are positive. EXTREMITIES:  No pedal edema. NEUROLOGIC:  Nonfocal. MUSCULOSKELETAL:  Arthritic changes in the knees and in the feet. NEUROPSYCHIATRIC:  Evaluation is unremarkable. SKIN:  Decreased turgor.  LABORATORY DATA:  Initial hematologic indices  showed WBC of 9.6, hemoglobin of 14.9, hematocrit of 45.0, platelet count of 168. Chemistry showed sodium of 135, potassium of 4.1, chloride of 100, bicarb of 24, BUN is 24, creatinine is 1.58.  First set of cardiac marker, troponin I less than 0.30.  Chest x-ray done showed bibasilar infiltrate, questionable mild CHF.  EKG not done.  IMPRESSION: 1. Questionable pneumonia (community-acquired). 2. ?Congestive heart failure. 3. Atrial fibrillation, status post pacemaker. 4. Gout. 5. Arthritis.  Plan is to admit the patient to telemetry.  The patient will be slowly rehydrated with normal saline IV to go at a rate of 50 mL an hour.   He will be on antibiotics i.e Rocephin 1 g IV q.24 h., and Zithromax 500 mg IV q.24 h.  He will also be given breathing treatment albuterol and Atrovent nebs q.4 h. and fever will be controlled with ibuprofen 400 mg p.o. t.i.d. p.r.n.  The patient will also be restarted on some of his home meds, which include allopurinol, flecainide, and also digoxin.  DVT prophylaxis will be done with Lovenox and GI prophylaxis with pantoprazole 40 mg p.o. daily.  Further workup to be ordered on this patient will include CBCD, CMP, and magnesium will be repeated in a.m.  The patient will have a BNP,cardiac enzymes q.6 h. x3, 2-D echo, CBC, CMP, and magnesium will be repeated in a.m.  The patient will be followed and evaluated on day-to-day basis.    Talmage Nap, MD    CN/MEDQ  D:  05/17/2011  T:  05/17/2011  Job:  [id number]

## 2011-05-17 NOTE — ED Notes (Signed)
Report called to [facility] in ICU.

## 2011-05-18 ENCOUNTER — Inpatient Hospital Stay (HOSPITAL_COMMUNITY): Payer: Medicare Other

## 2011-05-18 DIAGNOSIS — N179 Acute kidney failure, unspecified: Secondary | ICD-10-CM | POA: Diagnosis present

## 2011-05-18 DIAGNOSIS — D649 Anemia, unspecified: Secondary | ICD-10-CM | POA: Diagnosis not present

## 2011-05-18 DIAGNOSIS — E872 Acidosis, unspecified: Secondary | ICD-10-CM | POA: Diagnosis present

## 2011-05-18 DIAGNOSIS — Z950 Presence of cardiac pacemaker: Secondary | ICD-10-CM | POA: Insufficient documentation

## 2011-05-18 LAB — COMPREHENSIVE METABOLIC PANEL
AST: 14 U/L (ref 0–37)
Albumin: 3 g/dL — ABNORMAL LOW (ref 3.5–5.2)
BUN: 21 mg/dL (ref 6–23)
Chloride: 103 mEq/L (ref 96–112)
Creatinine, Ser: 1.25 mg/dL (ref 0.50–1.35)
Potassium: 3.8 mEq/L (ref 3.5–5.1)
Total Bilirubin: 0.6 mg/dL (ref 0.3–1.2)
Total Protein: 6.4 g/dL (ref 6.0–8.3)

## 2011-05-18 LAB — CBC
HCT: 37.5 % — ABNORMAL LOW (ref 39.0–52.0)
Hemoglobin: 12.4 g/dL — ABNORMAL LOW (ref 13.0–17.0)
RBC: 4.12 MIL/uL — ABNORMAL LOW (ref 4.22–5.81)
WBC: 9.1 10*3/uL (ref 4.0–10.5)

## 2011-05-18 LAB — TSH: TSH: 1.341 u[IU]/mL (ref 0.350–4.500)

## 2011-05-18 LAB — LACTIC ACID, PLASMA: Lactic Acid, Venous: 1.6 mmol/L (ref 0.5–2.2)

## 2011-05-18 LAB — DIFFERENTIAL
Basophils Relative: 0 % (ref 0–1)
Lymphocytes Relative: 18 % (ref 12–46)
Lymphs Abs: 1.6 10*3/uL (ref 0.7–4.0)
Monocytes Absolute: 0.8 10*3/uL (ref 0.1–1.0)
Monocytes Relative: 9 % (ref 3–12)
Neutro Abs: 6.6 10*3/uL (ref 1.7–7.7)
Neutrophils Relative %: 72 % (ref 43–77)

## 2011-05-18 LAB — MAGNESIUM: Magnesium: 2 mg/dL (ref 1.5–2.5)

## 2011-05-18 LAB — URINE CULTURE
Colony Count: NO GROWTH
Culture  Setup Time: 201207121803
Culture: NO GROWTH

## 2011-05-18 LAB — VITAMIN B12: Vitamin B-12: 468 pg/mL (ref 211–911)

## 2011-05-18 MED ORDER — METOPROLOL SUCCINATE 12.5 MG HALF TABLET
12.5000 mg | ORAL_TABLET | ORAL | Status: DC
  Filled 2011-05-18 (×3): qty 1

## 2011-05-18 MED ORDER — METOPROLOL SUCCINATE 12.5 MG HALF TABLET
12.5000 mg | ORAL_TABLET | Freq: Every day | ORAL | Status: DC
  Administered 2011-05-19: 12.5 mg via ORAL
  Filled 2011-05-18 (×3): qty 1

## 2011-05-18 MED ORDER — METOPROLOL SUCCINATE 12.5 MG HALF TABLET
12.5000 mg | ORAL_TABLET | Freq: Every day | ORAL | Status: DC
  Filled 2011-05-18 (×3): qty 1

## 2011-05-18 NOTE — Progress Notes (Signed)
Pt transferred to room 340, per MD order. Report given to RN. Pt transferred via wheelchair. Family member aware of transfer.

## 2011-05-18 NOTE — Progress Notes (Signed)
  Subjective: Pt feels a little better.  Objective: Vital signs in last 24 hours: Temp:  [97.7 F (36.5 C)-99.2 F (37.3 C)] 99.2 F (37.3 C) (07/13 0400) Pulse Rate:  [59-69] 59  (07/13 0600) Resp:  [9-23] 19  (07/13 0600) BP: (98-151)/(49-79) 139/71 mmHg (07/13 0600) SpO2:  [95 %-100 %] 97 % (07/13 0600) Weight change:  Last BM Date: 05/17/11  Intake/Output from previous day: 07/12 0701 - 07/13 0700 In: 2138.8 [P.O.:650; I.V.:1238.8; IV Piggyback:250] Out: 901 [Urine:901] Intake/Output this shift:    General appearance: alert Resp:  Few scattered crackles Cardio: S1, S2 normal Abdomen: positive BS, soft, obese, ND/NT Extremities: varicosities both lower legs; no pedal edema. Neuro: A&O x 3.  Lab Results:  Basename 05/18/11 0449 05/17/11 0409  WBC 9.1 11.8*  HGB 12.4* 13.6  HCT 37.5* 41.5  PLT 147* 147*   BMET  Basename 05/18/11 0449 05/17/11 0409  NA 136 137  K 3.8 4.1  CL 103 102  CO2 24 27  GLUCOSE 120* 162*  BUN 21 27*  CREATININE 1.25 1.79*  CALCIUM 8.1* 8.9    Studies/Results: No results found.  Medications: I have reviewed the patient's current medications.  Assessment:  1. CAP: On Rocephin and Zithromax. Clinically improving. WBC better; fever resolved. 2.Questionable "CHF" on CXR yesterday. Clinically the Pt does not have CHF. BNP noted, not excessively elevated. 3. ARF, resolved w/ IVF hydration. 4. Mild dilutional anemia. 5. AFib/Hx Pacemaker.  Has mild bradycardia. Metoprolol not restarted yet.  On ASA. 6. Stable thrombocytopenia.  Vit B12 WNL. 7. Lactic acidosis, resolved. 8. Mild hyperglycemia.   Plan: 1. Saline lock IVF.  Check Hb A1C. Increase activity. Transfer to tele.  Restart metoprolol at smaller dose.     LOS: 1 day   Claritza July 05/18/2011, 7:22 AM

## 2011-05-19 LAB — HEMOGLOBIN A1C
Hgb A1c MFr Bld: 6.5 % — ABNORMAL HIGH (ref <5.7)
Mean Plasma Glucose: 140 mg/dL — ABNORMAL HIGH (ref <117)

## 2011-05-19 MED ORDER — VANCOMYCIN HCL 1000 MG IV SOLR
750.0000 mg | Freq: Two times a day (BID) | INTRAVENOUS | Status: DC
  Administered 2011-05-19 – 2011-05-21 (×4): 750 mg via INTRAVENOUS
  Filled 2011-05-19 (×6): qty 750

## 2011-05-19 MED ORDER — METOPROLOL SUCCINATE ER 25 MG PO TB24
25.0000 mg | ORAL_TABLET | Freq: Every day | ORAL | Status: DC
  Administered 2011-05-19 – 2011-05-21 (×3): 25 mg via ORAL
  Filled 2011-05-19 (×3): qty 1

## 2011-05-19 NOTE — Progress Notes (Signed)
   Subjective: Pt had a symptomatic fever w/ chills overnight. He still has a productive cough w/ brownish-red sputum. No c/o diarrhea or pain w/ urination.  Objective: Vital signs in last 24 hours: Temp:  [98.2 F (36.8 C)-101.7 F (38.7 C)] 98.7 F (37.1 C) (07/14 0740) Pulse Rate:  [60-68] 68  (07/14 0600) Resp:  [16-20] 20  (07/14 0600) BP: (118-154)/(72-80) 154/73 mmHg (07/14 0600) SpO2:  [94 %-98 %] 95 % (07/14 0849) Weight change:  Last BM Date: 05/17/11  Intake/Output from previous day: 07/13 0701 - 07/14 0700 In: 1205 [P.O.:730; I.V.:75; IV Piggyback:400] Out: 850 [Urine:850] Intake/Output this shift: I/O this shift: In: 240 [P.O.:240] Out: -   General appearance: alert Resp:  Few scattered crackles scattered bilaterally. Cardio: S1, S2 with occasional ectopic beat. Abdomen: positive BS, soft, obese, ND/NT Extremities: varicosities both lower legs; no pedal edema. Neuro: A&O x 3.  Lab Results:  Basename 05/18/11 0449 05/17/11 0409  WBC 9.1 11.8*  HGB 12.4* 13.6  HCT 37.5* 41.5  PLT 147* 147*   BMET  Basename 05/18/11 0449 05/17/11 0409  NA 136 137  K 3.8 4.1  CL 103 102  CO2 24 27  GLUCOSE 120* 162*  BUN 21 27*  CREATININE 1.25 1.79*  CALCIUM 8.1* 8.9    Studies/Results: Chest xray results noted for bilateral infrahilar/perihilar airspace opacities, etc.  Medications: I have reviewed the patient's current medications.  Assessment:  1. CAP: On Rocephin and Zithromax.   Pt developed a fever overnight. Blood cultures ordered on 05/17/11 are negative so far.  2.Questionable "CHF" on CXR yesterday. Clinically the Pt does not have CHF. BNP noted, not excessively elevated.  3. ARF, resolved w/ IVF hydration.  4. Mild dilutional anemia.  5. PAFib/Hx Pacemaker.  Had mild bradycardia. Toprol XL restarted at a smaller dose.  On ASA.  6. Stable thrombocytopenia.  Vit B12 WNL.  7. Lactic acidosis, resolved.  8. Mild hyperglycemia.  HbA1c is 6.5;  will continue to monitor.  9. HTN.   Plan:  1. Add Vancomycin empirically. 2.Check labs in am. Reculture if he spikes another fever. 3. Increase dose of Toprol XL.    LOS: 2 days   Teodoro Jeffreys 05/19/2011, 12:53 PM

## 2011-05-19 NOTE — Progress Notes (Signed)
Pharmacy vancomycin protocol Pneumonia Vancomycin trough goal 15-20 Patient weight  81 kg Patient SCR  51 Start Vancomycin 750mg  IV q12h Obtain trough level at steady state (7/16) Monitor renal function, vancomycin levels and adjust accordingly Josephine Igo 05/19/2011

## 2011-05-19 NOTE — Plan of Care (Signed)
Problem: Phase III Progression Outcomes Goal: Pain controlled on oral analgesia Outcome: Not Applicable Date Met:  05/19/11 Pt not c/o any pain

## 2011-05-20 ENCOUNTER — Encounter (HOSPITAL_COMMUNITY): Payer: Self-pay | Admitting: Internal Medicine

## 2011-05-20 DIAGNOSIS — J90 Pleural effusion, not elsewhere classified: Secondary | ICD-10-CM | POA: Diagnosis present

## 2011-05-20 DIAGNOSIS — I10 Essential (primary) hypertension: Secondary | ICD-10-CM | POA: Diagnosis not present

## 2011-05-20 LAB — CBC
MCH: 29.8 pg (ref 26.0–34.0)
MCHC: 32.4 g/dL (ref 30.0–36.0)
MCV: 91.8 fL (ref 78.0–100.0)
Platelets: 156 10*3/uL (ref 150–400)
RBC: 4 MIL/uL — ABNORMAL LOW (ref 4.22–5.81)

## 2011-05-20 LAB — COMPREHENSIVE METABOLIC PANEL
ALT: 30 U/L (ref 0–53)
AST: 34 U/L (ref 0–37)
Albumin: 3 g/dL — ABNORMAL LOW (ref 3.5–5.2)
CO2: 27 mEq/L (ref 19–32)
Chloride: 101 mEq/L (ref 96–112)
Creatinine, Ser: 1.14 mg/dL (ref 0.50–1.35)
Potassium: 4 mEq/L (ref 3.5–5.1)
Sodium: 134 mEq/L — ABNORMAL LOW (ref 135–145)
Total Bilirubin: 0.7 mg/dL (ref 0.3–1.2)

## 2011-05-20 MED ORDER — FUROSEMIDE 10 MG/ML IJ SOLN
10.0000 mg | Freq: Two times a day (BID) | INTRAMUSCULAR | Status: DC
  Administered 2011-05-20 – 2011-05-21 (×3): 10 mg via INTRAVENOUS
  Filled 2011-05-20 (×3): qty 2

## 2011-05-20 MED ORDER — SODIUM CHLORIDE 0.9 % IJ SOLN
INTRAMUSCULAR | Status: AC
  Administered 2011-05-20: 16:00:00
  Filled 2011-05-20: qty 10

## 2011-05-20 MED ORDER — PANTOPRAZOLE SODIUM 40 MG PO TBEC
40.0000 mg | DELAYED_RELEASE_TABLET | Freq: Every day | ORAL | Status: DC
  Administered 2011-05-20 – 2011-05-21 (×2): 40 mg via ORAL
  Filled 2011-05-20 (×2): qty 1

## 2011-05-20 MED ORDER — ALBUTEROL SULFATE HFA 108 (90 BASE) MCG/ACT IN AERS: 2.0000 | INHALATION_SPRAY | Freq: Four times a day (QID) | RESPIRATORY_TRACT | Status: DC | PRN

## 2011-05-20 MED ORDER — BENZONATATE 200 MG PO CAPS: 200.0000 mg | ORAL_CAPSULE | Freq: Three times a day (TID) | ORAL | Status: AC | PRN

## 2011-05-20 MED ORDER — POTASSIUM CHLORIDE CRYS ER 20 MEQ PO TBCR
20.0000 meq | EXTENDED_RELEASE_TABLET | Freq: Every day | ORAL | Status: DC
  Administered 2011-05-20 – 2011-05-21 (×2): 20 meq via ORAL
  Filled 2011-05-20 (×2): qty 1

## 2011-05-20 MED ORDER — MOXIFLOXACIN HCL 400 MG PO TABS: 400.0000 mg | ORAL_TABLET | Freq: Every day | ORAL | Status: AC

## 2011-05-20 NOTE — Discharge Summary (Signed)
Physician Discharge Summary  IRIS TATSCH MRN: [id number] DOB/AGE: 07/01/1934 77 y.o.  PCP: Assunta Found, MD   Admit date: 05/17/2011 Discharge date: 05/20/2011  Discharge Diagnoses:   1. Community-acquired pneumonia. 2. Lactic acidosis secondary to pneumonia. 3. Thrombocytopenia, resolved. 4. Mild anemia, needs out patient followup. 5. Chronic atrial fibrillation with intermittent bradycardia. Not on Coumadin therapy. History of pacemaker. 6. Bilateral air space opacities with pleural effusions, treated as bilateral pneumonia; a consideration was made for mild pulmonary edema. 7. Acute renal failure, secondary to volume depletion/prerenal azotemia, resolved. 8. Hyperglycemia. The patient's hemoglobin A1c was 6.5. This will need outpatient followup. 9. Hypertension, malignant at times.      Current Discharge Medication List    START taking these medications   Details  albuterol (PROVENTIL HFA;VENTOLIN HFA) 108 (90 BASE) MCG/ACT inhaler Inhale 2 puffs into the lungs every 6 (six) hours as needed for wheezing or shortness of breath. Qty: 1 Inhaler, Refills: 1    benzonatate (TESSALON) 200 MG capsule Take 1 capsule (200 mg total) by mouth 3 (three) times daily as needed for cough. Qty: 20 capsule, Refills: 0    moxifloxacin (AVELOX) 400 MG tablet Take 1 tablet (400 mg total) by mouth daily. Qty: 6 tablet, Refills: 0      CONTINUE these medications which have NOT CHANGED   Details  allopurinol (ZYLOPRIM) 100 MG tablet Take 100 mg by mouth daily.      aspirin 325 MG tablet Take 325 mg by mouth daily.      Calcium Carbonate-Vitamin D (CALTRATE 600+D) 600-400 MG-UNIT per tablet Take 1 tablet by mouth every other day.      digoxin (LANOXIN) 0.125 MG tablet Take 125 mcg by mouth at bedtime.      flecainide (TAMBOCOR) 150 MG tablet Take 150 mg by mouth 2 (two) times daily.      metoprolol succinate (TOPROL-XL) 25 MG 24 hr tablet Take 25 mg by mouth every morning.      pantoprazole (PROTONIX) 40 MG tablet Take 40 mg by mouth daily.      POTASSIUM GLUCONATE PO Take 1 tablet by mouth every other day.      indomethacin (INDOCIN) 50 MG capsule Take 50 mg by mouth 3 (three) times daily.        STOP taking these medications     metoprolol tartrate (LOPRESSOR) 25 MG tablet      warfarin (COUMADIN) 2.5 MG tablet         Discharge Condition: Stable and improved.  Disposition:  The patient will be discharged to home tomorrow if he remains stable and in improved condition.   Consults: None   Significant Diagnostic Studies: Dg Chest 2 View  05/18/2011  *RADIOLOGY REPORT*  Clinical Data: Cough.  CHEST - 2 VIEW  Comparison: 05/17/2011  Findings: Dual lead pacer noted along with borderline cardiomegaly.  Indistinct infrahilar airspace opacity on the right noted.  There may be some linear perihilar airspace opacities on the left.  With pulmonary vasculature appears to be otherwise of normal caliber. There is mild blunting of the left posterior costophrenic angle suggesting a trace left pleural effusion.  IMPRESSION:  1.  Indistinct infrahilar opacities.  Given the lack of leukocytosis and elevated Pro B natriuretic peptide levels today, this probably represents a resolving edema rather than pneumonia. 2.  Trace left pleural effusion.  Original Report Authenticated By: Dellia Cloud, M.D.   Dg Chest Portable 1 View  05/17/2011  *RADIOLOGY REPORT*  Clinical Data:  Fever.  Cough.  Weakness.  Fatigue.  PORTABLE CHEST - 1 VIEW  Comparison: 05/01/2010.  Findings: Reverse lordotic projection.  Interstitial pulmonary edema is present.  Cardiomegaly.  Dual lead left subclavian pacemaker.  The leads of the pacemaker are probably unchanged allowing for projection.  Pulmonary vascular congestion. There is erosion of the right glenoid and secondary osteoarthritis.  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  IMPRESSION:  Constellation of findings compatible with mild CHF.  Original Report Authenticated By: Andreas Newport, M.D.    Microbiology: Recent Results (from the past 240 hour(s))  URINE CULTURE     Status: Normal   Collection Time   05/17/11  1:00 AM      Component Value Range Status Comment   Specimen Description URINE, CLEAN CATCH   Final    Special Requests NONE   Final    Setup Time [id number]   Final    Colony Count NO GROWTH   Final    Culture NO GROWTH   Final    Report Status 05/18/2011 FINAL   Final   CULTURE, BLOOD (ROUTINE X 2)     Status: Normal (Preliminary result)   Collection Time   05/17/11  1:12 AM      Component Value Range Status Comment   Specimen Description LEFT ANTECUBITAL   Final    Special Requests BOTTLES DRAWN AEROBIC AND ANAEROBIC 6CC   Final    Culture NO GROWTH 3 DAYS   Final    Report Status PENDING   Incomplete   CULTURE, BLOOD (ROUTINE X 2)     Status: Normal (Preliminary result)   Collection Time   05/17/11  3:00 AM      Component Value Range Status Comment   Specimen Description BLOOD RIGHT ARM   Final    Special Requests BOTTLES DRAWN AEROBIC AND ANAEROBIC 6CC EACH   Final    Culture NO GROWTH 3 DAYS   Final    Report Status PENDING   Incomplete   MRSA PCR SCREENING     Status: Normal   Collection Time   05/17/11  4:14 AM      Component Value Range Status Comment   MRSA by PCR NEGATIVE  NEGATIVE  Final      Labs: Results for orders placed during the hospital encounter of 05/17/11 (from the past 48 hour(s))  COMPREHENSIVE METABOLIC PANEL     Status: Abnormal   Collection Time   05/20/11  5:30 AM      Component Value Range Comment   Sodium 134 (*) 135 - 145 (mEq/L)    Potassium 4.0  3.5 - 5.1 (mEq/L)    Chloride 101  96 - 112 (mEq/L)    CO2 27  19 - 32 (mEq/L)    Glucose, Bld 117 (*) 70 - 99 (mg/dL)    BUN 14  6 - 23 (mg/dL)    Creatinine, Ser 4.78  0.50 - 1.35 (mg/dL) **Please note change in reference range.**   Calcium 8.5  8.4 - 10.5 (mg/dL)      Total Protein 6.8  6.0 - 8.3 (g/dL)    Albumin 3.0 (*) 3.5 - 5.2 (g/dL)    AST 34  0 - 37 (U/L)    ALT 30  0 - 53 (U/L)    Alkaline Phosphatase 54  39 - 117 (U/L)    Total Bilirubin 0.7  0.3 - 1.2 (mg/dL)    GFR calc non Af Amer >60  >  60 (mL/min)    GFR calc Af Amer >60  >60 (mL/min)   CBC     Status: Abnormal   Collection Time   05/20/11  5:30 AM      Component Value Range Comment   WBC 7.8  4.0 - 10.5 (K/uL)    RBC 4.00 (*) 4.22 - 5.81 (MIL/uL)    Hemoglobin 11.9 (*) 13.0 - 17.0 (g/dL)    HCT 91.4 (*) 78.2 - 52.0 (%)    MCV 91.8  78.0 - 100.0 (fL)    MCH 29.8  26.0 - 34.0 (pg)    MCHC 32.4  30.0 - 36.0 (g/dL)    RDW 95.6  21.3 - 08.6 (%)    Platelets 156  150 - 400 (K/uL)      HPI : The patient is a 77 year old man with a past medical history significant for chronic atrial fibrillation, status post pacemaker, coronary artery disease, and Barrett's esophagitis. He presented to the emergency department on 05/17/2011 with a chief complaint of a purulent cough and fever. During the initial evaluation in the emergency department, the patient was noted to be febrile with a temperature of 104.66F. His chest x-ray revealed bibasilar infiltrates. He was admitted for further evaluation and management.   HOSPITAL COURSE: Blood cultures were ordered on admission. They have remained negative so far. He was given 400 mg of intravenous Avelox in the emergency department. He was subsequently started on treatment with Rocephin and azithromycin. Oxygen was applied and titrated to keep his oxygen saturations greater than 92%. Albuterol nebulizations were administered every 6 hours for mild bronchospasms. Tessalon Perles were added for cough as needed. The patient was noted to have a somewhat purulent cough that was intermittently blood tinged. A sputum specimen was not obtained as the patient had been given several doses of antibiotics before it was realized that a sputum specimen could have been sent  for culturing. As mentioned above, his temperature was greater than 104 in the emergency department. Following 24 hours of treatment, he became afebrile. He again spiked a fever of greater than 101F approximately 24 hours after he became afebrile. For this reason, vancomycin was added to cover staph.  Gentle IV fluids were started as it appeared that the patient was volume depleted. His BUN was 24 and his creatinine was 1.58, indicative of acute renal insufficiency. Following IV fluid hydration, his BUN and creatinine normalized.  A followup chest x-ray was ordered. It revealed persistent bibasilar infiltrates with some mild resolution. The radiologist's interpretation was that the patient probably had pulmonary edema or congestive heart failure. The patient's BNP was approximately 1300 however clinically he did not appear to have pulmonary edema or decompensated congestive heart failure. He has no prior history of congestive heart failure, in fact, his ejection fraction on the last 2-D echocardiogram was greater than 60%. Nevertheless, when he became more hypertensive, gentle IV Lasix was given for 24 hours.  The patient's platelet count was low on admission and remained mildly low during the hospitalization up until today. His TSH and vitamin B 12 levels were assessed and both were well within normal limits. The thrombocytopenia may have been secondary to the acute infection. His venous glucose was also noted to be mildly elevated. His hemoglobin A1c was 6.5. He was not clinically diagnosed with diabetes mellitus however his potential for diabetes will need to be followed in the outpatient setting. The patient was also noted to have mild anemia, not concerning for the need for a  transfusion, however he may require a followup EGD and/or colonoscopy by his gastroenterologist Dr. Jena Gauss. The patient has a known history of Barrett's esophagus. Protonix was restarted.  The patient remained hemodynamically  stable although his blood pressure became moderately elevated at times. Toprol XL  was initially withheld due to to his low normal blood pressures on admission. However as he improved clinically, his blood pressure increased. Toprol XL was restarted. His blood pressure improved.  The patient is symptomatically improved. He has been afebrile for 24 hours. As of today, he has received 3 days of intravenous antibiotics. His white blood cell count is within normal limits. His lactic acidosis has resolved. His thrombocytopenia has resolved. The plan is to give him one more day of intravenous antibiotics before he is discharged to home tomorrow on 6 more days of Avelox.       Discharge Exam:   Blood pressure 137/81, pulse 63, temperature 98.3 F (36.8 C), temperature source Oral, resp. rate 20, height 5\' 6"  (1.676 m), weight 81.6 kg (179 lb 14.3 oz), SpO2 94.00%.  Gen.: The patient is currently sitting up in his chair. He continues to have a purulent cough but over the past 24 hours he says that his cough is a lot less. He has no complaints of pleurisy, chest pain, or shortness of breath. HEENT: Head is normocephalic nontraumatic pupils equal round reactive to light extra movements are intact. Oropharynx reveals moist mucous membranes. Lungs: Occasional crackles in the bases, clear anteriorly, breathing is nonlabored. Heart: S1-S2 with no ectopic beats or murmurs rubs or gallops. Abdomen: Positive bowel sounds soft mildly obese nontender nondistended. Extremities: No pretibial edema.      Discharge Orders    Future Orders Please Complete By Expires   Diet - low sodium heart healthy      Increase activity slowly         Follow-up Information    Follow up with GOLDING,JOHN CABOT. Make an appointment in 1 week.   Contact information:   [address] [place] [address] [phone]          Signed: Issa Luster 05/20/2011, 5:22 PM

## 2011-05-21 NOTE — Progress Notes (Signed)
Pt discharged with instructions, carenotes, and prescriptions. Pt verbalizes understanding and voices no further complaints or concerns voiced at this time.  Pt left the floor via w/c with staff and family in stable condition.

## 2011-05-21 NOTE — Progress Notes (Signed)
Subjective:  No new complaints Objective: Vital signs in last 24 hours: Temp:  [98.3 F (36.8 C)-99.6 F (37.6 C)] 99.6 F (37.6 C) (07/16 0600) Pulse Rate:  [60-88] 60  (07/16 0600) Resp:  [20-24] 20  (07/16 0600) BP: (137-179)/(68-96) 165/68 mmHg (07/16 0600) SpO2:  [93 %-98 %] 93 % (07/16 0806) Weight change:  Last BM Date: 05/20/11  Intake/Output from previous day: 07/15 0701 - 07/16 0700 In: 1322 [P.O.:720; IV Piggyback:602] Out: -  Intake/Output this shift:    General appearance: alert and no distress Resp: clear to auscultation bilaterally Cardio: regular rate and rhythm  Lab Results:  Basename 05/20/11 0530  WBC 7.8  HGB 11.9*  HCT 36.7*  PLT 156   BMET  Basename 05/20/11 0530  NA 134*  K 4.0  CL 101  CO2 27  GLUCOSE 117*  BUN 14  CREATININE 1.14  CALCIUM 8.5    Studies/Results: No results found.   Assessment/Plan: discharge  LOS: 4 days   Mikeyla Music L 05/21/2011, 10:15 AM

## 2011-05-22 LAB — CULTURE, BLOOD (ROUTINE X 2)
Culture: NO GROWTH
Culture: NO GROWTH

## 2011-06-27 NOTE — Progress Notes (Signed)
Encounter addended by: Karen Kays on: 06/27/2011  5:06 PM<BR>     Documentation filed: Flowsheet VN

## 2011-07-05 ENCOUNTER — Ambulatory Visit (HOSPITAL_COMMUNITY)
Admission: RE | Admit: 2011-07-05 | Discharge: 2011-07-05 | Disposition: A | Discharge status: Home / Self Care | Payer: Medicare Other | Source: Ambulatory Visit | Attending: Family Medicine | Admitting: Family Medicine

## 2011-07-05 ENCOUNTER — Other Ambulatory Visit (HOSPITAL_COMMUNITY): Payer: Self-pay | Admitting: Family Medicine

## 2011-07-05 DIAGNOSIS — J159 Unspecified bacterial pneumonia: Secondary | ICD-10-CM | POA: Insufficient documentation

## 2011-07-05 DIAGNOSIS — Z09 Encounter for follow-up examination after completed treatment for conditions other than malignant neoplasm: Secondary | ICD-10-CM | POA: Insufficient documentation

## 2011-07-05 DIAGNOSIS — I2510 Atherosclerotic heart disease of native coronary artery without angina pectoris: Secondary | ICD-10-CM

## 2011-07-17 ENCOUNTER — Other Ambulatory Visit (HOSPITAL_COMMUNITY): Payer: Self-pay | Admitting: Cardiovascular Disease

## 2011-07-17 ENCOUNTER — Ambulatory Visit (HOSPITAL_COMMUNITY)
Admission: RE | Admit: 2011-07-17 | Discharge: 2011-07-17 | Disposition: A | Discharge status: Home / Self Care | Payer: Medicare Other | Source: Ambulatory Visit | Attending: Cardiovascular Disease | Admitting: Cardiovascular Disease

## 2011-07-17 DIAGNOSIS — Z01818 Encounter for other preprocedural examination: Secondary | ICD-10-CM | POA: Insufficient documentation

## 2011-07-17 DIAGNOSIS — Z01811 Encounter for preprocedural respiratory examination: Secondary | ICD-10-CM

## 2011-07-17 DIAGNOSIS — Z950 Presence of cardiac pacemaker: Secondary | ICD-10-CM | POA: Insufficient documentation

## 2011-07-20 ENCOUNTER — Ambulatory Visit (HOSPITAL_COMMUNITY)
Admission: RE | Admit: 2011-07-20 | Discharge: 2011-07-20 | Disposition: A | Discharge status: Home / Self Care | Payer: Medicare Other | Source: Ambulatory Visit | Attending: Cardiovascular Disease | Admitting: Cardiovascular Disease

## 2011-07-20 DIAGNOSIS — K219 Gastro-esophageal reflux disease without esophagitis: Secondary | ICD-10-CM | POA: Insufficient documentation

## 2011-07-20 DIAGNOSIS — Z950 Presence of cardiac pacemaker: Secondary | ICD-10-CM | POA: Insufficient documentation

## 2011-07-20 DIAGNOSIS — I509 Heart failure, unspecified: Secondary | ICD-10-CM | POA: Insufficient documentation

## 2011-07-20 DIAGNOSIS — I4891 Unspecified atrial fibrillation: Secondary | ICD-10-CM | POA: Insufficient documentation

## 2011-07-20 LAB — PROTIME-INR: INR: 2.87 — ABNORMAL HIGH (ref 0.00–1.49)

## 2011-07-25 NOTE — Op Note (Signed)
  NAME:  [name], [name] NO.:  [id number]  MEDICAL RECORD NO.:  [id number]  LOCATION:                                 FACILITY:  PHYSICIAN:  Thurmon Fair, MD     DATE OF BIRTH:  January 09, 1934  DATE OF PROCEDURE: DATE OF DISCHARGE:                              OPERATIVE REPORT   [name] presented to the short-stay area today for elective synchronized cardioversion of atrial fibrillation.  PROCEDURES PERFORMED: 1. Moderate sedation (Dr. Chaney Malling of the Anesthesiology Service). 2. Synchronized electrical cardioversion.  REASON:  Symptomatic atrial fibrillation.  [name] had mostly atrial flutter with a fairly slow atrial rate and 2:1 conduction alternating with ventricular pacing.  Episodes of atrial fibrillation have also been noted.  Attempts at overdrive pacing have been unsuccessful.  DESCRIPTION OF PROCEDURE:  After risks and benefits of the procedure were described, the patient provided informed consent.  Anterior and posterior chest pads were placed with care being taken that the pads to be located at a distance from the pacemaker.  Propofol intravenously was used for moderate sedation.  A single synchronized biphasic 150 joules direct current cardioversion shock was administered.  The rhythm converted to atrial ventricular sequential pacing.  No immediate complications occurred.  Following the procedure, several tensor made to refine device function. Eventually, the device was programmed to MVP mode (AAIR-DDDR).  In this mode, while atrial pacing at 60 beats per minute, there was native AV conduction with narrow QRS complexes, albeit with very long PR intervals of approximately 500 milliseconds.  Today, the INR was 2.87.  The patient is to return on September 18 for repeat prothrombin time/INR check and on September 26 for a clinic visit.     Thurmon Fair, MD     MC/MEDQ  D:  07/20/2011  T:  07/20/2011  Job:  [id number]  cc:    [facility] [place], [place] R. Roetta Sessions, MD FACP [facility]  Electronically Signed by Thurmon Fair M.D. on 07/25/2011 11:27:21 AM

## 2011-11-07 DIAGNOSIS — R972 Elevated prostate specific antigen [PSA]: Secondary | ICD-10-CM | POA: Diagnosis not present

## 2011-11-07 DIAGNOSIS — I495 Sick sinus syndrome: Secondary | ICD-10-CM | POA: Diagnosis not present

## 2011-11-07 DIAGNOSIS — I4891 Unspecified atrial fibrillation: Secondary | ICD-10-CM | POA: Diagnosis not present

## 2011-11-16 DIAGNOSIS — R972 Elevated prostate specific antigen [PSA]: Secondary | ICD-10-CM | POA: Diagnosis not present

## 2011-12-05 DIAGNOSIS — H52229 Regular astigmatism, unspecified eye: Secondary | ICD-10-CM | POA: Diagnosis not present

## 2011-12-05 DIAGNOSIS — H524 Presbyopia: Secondary | ICD-10-CM | POA: Diagnosis not present

## 2011-12-05 DIAGNOSIS — H5200 Hypermetropia, unspecified eye: Secondary | ICD-10-CM | POA: Diagnosis not present

## 2011-12-05 DIAGNOSIS — H43399 Other vitreous opacities, unspecified eye: Secondary | ICD-10-CM | POA: Diagnosis not present

## 2011-12-05 DIAGNOSIS — I4891 Unspecified atrial fibrillation: Secondary | ICD-10-CM | POA: Diagnosis not present

## 2011-12-12 DIAGNOSIS — I4891 Unspecified atrial fibrillation: Secondary | ICD-10-CM | POA: Diagnosis not present

## 2012-01-07 DIAGNOSIS — I4891 Unspecified atrial fibrillation: Secondary | ICD-10-CM | POA: Diagnosis not present

## 2012-01-07 DIAGNOSIS — R55 Syncope and collapse: Secondary | ICD-10-CM | POA: Diagnosis not present

## 2012-01-07 DIAGNOSIS — I10 Essential (primary) hypertension: Secondary | ICD-10-CM | POA: Diagnosis not present

## 2012-01-07 DIAGNOSIS — Z45018 Encounter for adjustment and management of other part of cardiac pacemaker: Secondary | ICD-10-CM | POA: Diagnosis not present

## 2012-01-21 DIAGNOSIS — I4891 Unspecified atrial fibrillation: Secondary | ICD-10-CM | POA: Diagnosis not present

## 2012-01-30 DIAGNOSIS — Z23 Encounter for immunization: Secondary | ICD-10-CM | POA: Diagnosis not present

## 2012-01-30 DIAGNOSIS — R197 Diarrhea, unspecified: Secondary | ICD-10-CM | POA: Diagnosis not present

## 2012-02-05 DIAGNOSIS — I10 Essential (primary) hypertension: Secondary | ICD-10-CM | POA: Diagnosis not present

## 2012-02-05 DIAGNOSIS — R7309 Other abnormal glucose: Secondary | ICD-10-CM | POA: Diagnosis not present

## 2012-02-05 DIAGNOSIS — Z125 Encounter for screening for malignant neoplasm of prostate: Secondary | ICD-10-CM | POA: Diagnosis not present

## 2012-02-05 DIAGNOSIS — E669 Obesity, unspecified: Secondary | ICD-10-CM | POA: Diagnosis not present

## 2012-02-05 DIAGNOSIS — E782 Mixed hyperlipidemia: Secondary | ICD-10-CM | POA: Diagnosis not present

## 2012-02-08 DIAGNOSIS — Z950 Presence of cardiac pacemaker: Secondary | ICD-10-CM | POA: Diagnosis not present

## 2012-02-08 DIAGNOSIS — I10 Essential (primary) hypertension: Secondary | ICD-10-CM | POA: Diagnosis not present

## 2012-02-08 DIAGNOSIS — E7800 Pure hypercholesterolemia, unspecified: Secondary | ICD-10-CM | POA: Diagnosis not present

## 2012-02-08 DIAGNOSIS — R079 Chest pain, unspecified: Secondary | ICD-10-CM | POA: Diagnosis not present

## 2012-02-13 DIAGNOSIS — E119 Type 2 diabetes mellitus without complications: Secondary | ICD-10-CM | POA: Diagnosis not present

## 2012-02-13 DIAGNOSIS — I10 Essential (primary) hypertension: Secondary | ICD-10-CM | POA: Diagnosis not present

## 2012-02-13 DIAGNOSIS — Z6831 Body mass index (BMI) 31.0-31.9, adult: Secondary | ICD-10-CM | POA: Diagnosis not present

## 2012-02-21 DIAGNOSIS — I4891 Unspecified atrial fibrillation: Secondary | ICD-10-CM | POA: Diagnosis not present

## 2012-03-14 DIAGNOSIS — E119 Type 2 diabetes mellitus without complications: Secondary | ICD-10-CM | POA: Diagnosis not present

## 2012-03-24 DIAGNOSIS — I4891 Unspecified atrial fibrillation: Secondary | ICD-10-CM | POA: Diagnosis not present

## 2012-04-21 DIAGNOSIS — I4891 Unspecified atrial fibrillation: Secondary | ICD-10-CM | POA: Diagnosis not present

## 2012-05-12 DIAGNOSIS — I4891 Unspecified atrial fibrillation: Secondary | ICD-10-CM | POA: Diagnosis not present

## 2012-05-19 DIAGNOSIS — N400 Enlarged prostate without lower urinary tract symptoms: Secondary | ICD-10-CM | POA: Diagnosis not present

## 2012-05-19 DIAGNOSIS — R972 Elevated prostate specific antigen [PSA]: Secondary | ICD-10-CM | POA: Diagnosis not present

## 2012-06-05 DIAGNOSIS — C61 Malignant neoplasm of prostate: Secondary | ICD-10-CM | POA: Diagnosis not present

## 2012-06-05 DIAGNOSIS — R972 Elevated prostate specific antigen [PSA]: Secondary | ICD-10-CM | POA: Diagnosis not present

## 2012-06-05 DIAGNOSIS — Z006 Encounter for examination for normal comparison and control in clinical research program: Secondary | ICD-10-CM | POA: Diagnosis not present

## 2012-06-05 DIAGNOSIS — R791 Abnormal coagulation profile: Secondary | ICD-10-CM | POA: Diagnosis not present

## 2012-06-16 DIAGNOSIS — C61 Malignant neoplasm of prostate: Secondary | ICD-10-CM | POA: Diagnosis not present

## 2012-06-16 DIAGNOSIS — Z1289 Encounter for screening for malignant neoplasm of other sites: Secondary | ICD-10-CM | POA: Diagnosis not present

## 2012-06-25 DIAGNOSIS — C61 Malignant neoplasm of prostate: Secondary | ICD-10-CM | POA: Diagnosis not present

## 2012-06-27 DIAGNOSIS — C61 Malignant neoplasm of prostate: Secondary | ICD-10-CM | POA: Diagnosis not present

## 2012-07-10 DIAGNOSIS — C61 Malignant neoplasm of prostate: Secondary | ICD-10-CM | POA: Diagnosis not present

## 2012-07-11 DIAGNOSIS — E119 Type 2 diabetes mellitus without complications: Secondary | ICD-10-CM | POA: Diagnosis not present

## 2012-07-11 DIAGNOSIS — Z7901 Long term (current) use of anticoagulants: Secondary | ICD-10-CM | POA: Diagnosis not present

## 2012-07-11 DIAGNOSIS — Z79899 Other long term (current) drug therapy: Secondary | ICD-10-CM | POA: Diagnosis not present

## 2012-07-11 DIAGNOSIS — Z9089 Acquired absence of other organs: Secondary | ICD-10-CM | POA: Diagnosis not present

## 2012-07-11 DIAGNOSIS — C61 Malignant neoplasm of prostate: Secondary | ICD-10-CM | POA: Diagnosis not present

## 2012-07-11 DIAGNOSIS — M109 Gout, unspecified: Secondary | ICD-10-CM | POA: Diagnosis not present

## 2012-07-11 DIAGNOSIS — M1710 Unilateral primary osteoarthritis, unspecified knee: Secondary | ICD-10-CM | POA: Diagnosis not present

## 2012-07-11 DIAGNOSIS — Z8249 Family history of ischemic heart disease and other diseases of the circulatory system: Secondary | ICD-10-CM | POA: Diagnosis not present

## 2012-07-11 DIAGNOSIS — Z950 Presence of cardiac pacemaker: Secondary | ICD-10-CM | POA: Diagnosis not present

## 2012-07-11 DIAGNOSIS — I10 Essential (primary) hypertension: Secondary | ICD-10-CM | POA: Diagnosis not present

## 2012-07-30 DIAGNOSIS — Z45018 Encounter for adjustment and management of other part of cardiac pacemaker: Secondary | ICD-10-CM | POA: Diagnosis not present

## 2012-07-30 DIAGNOSIS — I4891 Unspecified atrial fibrillation: Secondary | ICD-10-CM | POA: Diagnosis not present

## 2012-07-30 DIAGNOSIS — I10 Essential (primary) hypertension: Secondary | ICD-10-CM | POA: Diagnosis not present

## 2012-08-14 DIAGNOSIS — C61 Malignant neoplasm of prostate: Secondary | ICD-10-CM | POA: Diagnosis not present

## 2012-08-14 DIAGNOSIS — R319 Hematuria, unspecified: Secondary | ICD-10-CM | POA: Diagnosis not present

## 2012-08-21 DIAGNOSIS — Z792 Long term (current) use of antibiotics: Secondary | ICD-10-CM | POA: Diagnosis not present

## 2012-08-21 DIAGNOSIS — C61 Malignant neoplasm of prostate: Secondary | ICD-10-CM | POA: Diagnosis not present

## 2012-08-21 DIAGNOSIS — R791 Abnormal coagulation profile: Secondary | ICD-10-CM | POA: Diagnosis not present

## 2012-08-28 DIAGNOSIS — Z510 Encounter for antineoplastic radiation therapy: Secondary | ICD-10-CM | POA: Diagnosis not present

## 2012-08-28 DIAGNOSIS — C61 Malignant neoplasm of prostate: Secondary | ICD-10-CM | POA: Diagnosis not present

## 2012-09-01 DIAGNOSIS — I4891 Unspecified atrial fibrillation: Secondary | ICD-10-CM | POA: Diagnosis not present

## 2012-09-02 DIAGNOSIS — Z510 Encounter for antineoplastic radiation therapy: Secondary | ICD-10-CM | POA: Diagnosis not present

## 2012-09-02 DIAGNOSIS — C61 Malignant neoplasm of prostate: Secondary | ICD-10-CM | POA: Diagnosis not present

## 2012-09-03 DIAGNOSIS — Z510 Encounter for antineoplastic radiation therapy: Secondary | ICD-10-CM | POA: Diagnosis not present

## 2012-09-03 DIAGNOSIS — C61 Malignant neoplasm of prostate: Secondary | ICD-10-CM | POA: Diagnosis not present

## 2012-09-09 DIAGNOSIS — Z510 Encounter for antineoplastic radiation therapy: Secondary | ICD-10-CM | POA: Diagnosis not present

## 2012-09-09 DIAGNOSIS — C61 Malignant neoplasm of prostate: Secondary | ICD-10-CM | POA: Diagnosis not present

## 2012-09-10 DIAGNOSIS — C61 Malignant neoplasm of prostate: Secondary | ICD-10-CM | POA: Diagnosis not present

## 2012-09-10 DIAGNOSIS — Z510 Encounter for antineoplastic radiation therapy: Secondary | ICD-10-CM | POA: Diagnosis not present

## 2012-09-11 DIAGNOSIS — C61 Malignant neoplasm of prostate: Secondary | ICD-10-CM | POA: Diagnosis not present

## 2012-09-11 DIAGNOSIS — Z510 Encounter for antineoplastic radiation therapy: Secondary | ICD-10-CM | POA: Diagnosis not present

## 2012-09-12 DIAGNOSIS — C61 Malignant neoplasm of prostate: Secondary | ICD-10-CM | POA: Diagnosis not present

## 2012-09-12 DIAGNOSIS — Z510 Encounter for antineoplastic radiation therapy: Secondary | ICD-10-CM | POA: Diagnosis not present

## 2012-09-15 DIAGNOSIS — C61 Malignant neoplasm of prostate: Secondary | ICD-10-CM | POA: Diagnosis not present

## 2012-09-15 DIAGNOSIS — Z510 Encounter for antineoplastic radiation therapy: Secondary | ICD-10-CM | POA: Diagnosis not present

## 2012-09-18 DIAGNOSIS — C61 Malignant neoplasm of prostate: Secondary | ICD-10-CM | POA: Diagnosis not present

## 2012-09-18 DIAGNOSIS — Z510 Encounter for antineoplastic radiation therapy: Secondary | ICD-10-CM | POA: Diagnosis not present

## 2012-09-19 DIAGNOSIS — Z510 Encounter for antineoplastic radiation therapy: Secondary | ICD-10-CM | POA: Diagnosis not present

## 2012-09-19 DIAGNOSIS — C61 Malignant neoplasm of prostate: Secondary | ICD-10-CM | POA: Diagnosis not present

## 2012-09-20 DIAGNOSIS — Z6831 Body mass index (BMI) 31.0-31.9, adult: Secondary | ICD-10-CM | POA: Diagnosis not present

## 2012-09-20 DIAGNOSIS — J069 Acute upper respiratory infection, unspecified: Secondary | ICD-10-CM | POA: Diagnosis not present

## 2012-09-22 DIAGNOSIS — Z510 Encounter for antineoplastic radiation therapy: Secondary | ICD-10-CM | POA: Diagnosis not present

## 2012-09-22 DIAGNOSIS — C61 Malignant neoplasm of prostate: Secondary | ICD-10-CM | POA: Diagnosis not present

## 2012-09-23 DIAGNOSIS — C61 Malignant neoplasm of prostate: Secondary | ICD-10-CM | POA: Diagnosis not present

## 2012-09-23 DIAGNOSIS — Z510 Encounter for antineoplastic radiation therapy: Secondary | ICD-10-CM | POA: Diagnosis not present

## 2012-09-24 DIAGNOSIS — Z510 Encounter for antineoplastic radiation therapy: Secondary | ICD-10-CM | POA: Diagnosis not present

## 2012-09-24 DIAGNOSIS — C61 Malignant neoplasm of prostate: Secondary | ICD-10-CM | POA: Diagnosis not present

## 2012-09-25 DIAGNOSIS — C61 Malignant neoplasm of prostate: Secondary | ICD-10-CM | POA: Diagnosis not present

## 2012-09-25 DIAGNOSIS — Z510 Encounter for antineoplastic radiation therapy: Secondary | ICD-10-CM | POA: Diagnosis not present

## 2012-09-26 DIAGNOSIS — Z510 Encounter for antineoplastic radiation therapy: Secondary | ICD-10-CM | POA: Diagnosis not present

## 2012-09-26 DIAGNOSIS — C61 Malignant neoplasm of prostate: Secondary | ICD-10-CM | POA: Diagnosis not present

## 2012-09-27 DIAGNOSIS — C61 Malignant neoplasm of prostate: Secondary | ICD-10-CM | POA: Diagnosis not present

## 2012-09-27 DIAGNOSIS — Z510 Encounter for antineoplastic radiation therapy: Secondary | ICD-10-CM | POA: Diagnosis not present

## 2012-09-29 DIAGNOSIS — Z510 Encounter for antineoplastic radiation therapy: Secondary | ICD-10-CM | POA: Diagnosis not present

## 2012-09-29 DIAGNOSIS — C61 Malignant neoplasm of prostate: Secondary | ICD-10-CM | POA: Diagnosis not present

## 2012-09-30 DIAGNOSIS — Z510 Encounter for antineoplastic radiation therapy: Secondary | ICD-10-CM | POA: Diagnosis not present

## 2012-09-30 DIAGNOSIS — I4891 Unspecified atrial fibrillation: Secondary | ICD-10-CM | POA: Diagnosis not present

## 2012-09-30 DIAGNOSIS — C61 Malignant neoplasm of prostate: Secondary | ICD-10-CM | POA: Diagnosis not present

## 2012-10-01 DIAGNOSIS — Z510 Encounter for antineoplastic radiation therapy: Secondary | ICD-10-CM | POA: Diagnosis not present

## 2012-10-01 DIAGNOSIS — C61 Malignant neoplasm of prostate: Secondary | ICD-10-CM | POA: Diagnosis not present

## 2012-10-06 DIAGNOSIS — C61 Malignant neoplasm of prostate: Secondary | ICD-10-CM | POA: Diagnosis not present

## 2012-10-06 DIAGNOSIS — Z510 Encounter for antineoplastic radiation therapy: Secondary | ICD-10-CM | POA: Diagnosis not present

## 2012-10-07 DIAGNOSIS — C61 Malignant neoplasm of prostate: Secondary | ICD-10-CM | POA: Diagnosis not present

## 2012-10-08 DIAGNOSIS — C61 Malignant neoplasm of prostate: Secondary | ICD-10-CM | POA: Diagnosis not present

## 2012-10-09 DIAGNOSIS — C61 Malignant neoplasm of prostate: Secondary | ICD-10-CM | POA: Diagnosis not present

## 2012-10-13 DIAGNOSIS — C61 Malignant neoplasm of prostate: Secondary | ICD-10-CM | POA: Diagnosis not present

## 2012-10-13 DIAGNOSIS — I4891 Unspecified atrial fibrillation: Secondary | ICD-10-CM | POA: Diagnosis not present

## 2012-10-14 DIAGNOSIS — C61 Malignant neoplasm of prostate: Secondary | ICD-10-CM | POA: Diagnosis not present

## 2012-10-15 DIAGNOSIS — C61 Malignant neoplasm of prostate: Secondary | ICD-10-CM | POA: Diagnosis not present

## 2012-10-16 DIAGNOSIS — C61 Malignant neoplasm of prostate: Secondary | ICD-10-CM | POA: Diagnosis not present

## 2012-10-17 DIAGNOSIS — C61 Malignant neoplasm of prostate: Secondary | ICD-10-CM | POA: Diagnosis not present

## 2012-10-20 DIAGNOSIS — C61 Malignant neoplasm of prostate: Secondary | ICD-10-CM | POA: Diagnosis not present

## 2012-10-21 DIAGNOSIS — I4891 Unspecified atrial fibrillation: Secondary | ICD-10-CM | POA: Diagnosis not present

## 2012-10-21 DIAGNOSIS — C61 Malignant neoplasm of prostate: Secondary | ICD-10-CM | POA: Diagnosis not present

## 2012-10-22 DIAGNOSIS — E119 Type 2 diabetes mellitus without complications: Secondary | ICD-10-CM | POA: Diagnosis not present

## 2012-10-22 DIAGNOSIS — C61 Malignant neoplasm of prostate: Secondary | ICD-10-CM | POA: Diagnosis not present

## 2012-10-22 DIAGNOSIS — E785 Hyperlipidemia, unspecified: Secondary | ICD-10-CM | POA: Diagnosis not present

## 2012-10-22 DIAGNOSIS — E1129 Type 2 diabetes mellitus with other diabetic kidney complication: Secondary | ICD-10-CM | POA: Diagnosis not present

## 2012-10-22 DIAGNOSIS — Z6831 Body mass index (BMI) 31.0-31.9, adult: Secondary | ICD-10-CM | POA: Diagnosis not present

## 2012-10-22 DIAGNOSIS — I10 Essential (primary) hypertension: Secondary | ICD-10-CM | POA: Diagnosis not present

## 2012-10-22 DIAGNOSIS — M109 Gout, unspecified: Secondary | ICD-10-CM | POA: Diagnosis not present

## 2012-10-23 DIAGNOSIS — C61 Malignant neoplasm of prostate: Secondary | ICD-10-CM | POA: Diagnosis not present

## 2012-10-24 DIAGNOSIS — C61 Malignant neoplasm of prostate: Secondary | ICD-10-CM | POA: Diagnosis not present

## 2012-10-27 DIAGNOSIS — C61 Malignant neoplasm of prostate: Secondary | ICD-10-CM | POA: Diagnosis not present

## 2012-10-28 DIAGNOSIS — C61 Malignant neoplasm of prostate: Secondary | ICD-10-CM | POA: Diagnosis not present

## 2012-10-30 DIAGNOSIS — C61 Malignant neoplasm of prostate: Secondary | ICD-10-CM | POA: Diagnosis not present

## 2012-10-31 DIAGNOSIS — C61 Malignant neoplasm of prostate: Secondary | ICD-10-CM | POA: Diagnosis not present

## 2012-11-03 DIAGNOSIS — I4891 Unspecified atrial fibrillation: Secondary | ICD-10-CM | POA: Diagnosis not present

## 2012-11-03 DIAGNOSIS — C61 Malignant neoplasm of prostate: Secondary | ICD-10-CM | POA: Diagnosis not present

## 2012-11-04 DIAGNOSIS — C61 Malignant neoplasm of prostate: Secondary | ICD-10-CM | POA: Diagnosis not present

## 2012-11-06 DIAGNOSIS — Z510 Encounter for antineoplastic radiation therapy: Secondary | ICD-10-CM | POA: Diagnosis not present

## 2012-11-06 DIAGNOSIS — C61 Malignant neoplasm of prostate: Secondary | ICD-10-CM | POA: Diagnosis not present

## 2012-11-07 DIAGNOSIS — Z510 Encounter for antineoplastic radiation therapy: Secondary | ICD-10-CM | POA: Diagnosis not present

## 2012-11-07 DIAGNOSIS — C61 Malignant neoplasm of prostate: Secondary | ICD-10-CM | POA: Diagnosis not present

## 2012-11-10 DIAGNOSIS — C61 Malignant neoplasm of prostate: Secondary | ICD-10-CM | POA: Diagnosis not present

## 2012-11-10 DIAGNOSIS — Z510 Encounter for antineoplastic radiation therapy: Secondary | ICD-10-CM | POA: Diagnosis not present

## 2012-11-11 DIAGNOSIS — C61 Malignant neoplasm of prostate: Secondary | ICD-10-CM | POA: Diagnosis not present

## 2012-11-11 DIAGNOSIS — Z510 Encounter for antineoplastic radiation therapy: Secondary | ICD-10-CM | POA: Diagnosis not present

## 2012-11-12 DIAGNOSIS — Z510 Encounter for antineoplastic radiation therapy: Secondary | ICD-10-CM | POA: Diagnosis not present

## 2012-11-12 DIAGNOSIS — C61 Malignant neoplasm of prostate: Secondary | ICD-10-CM | POA: Diagnosis not present

## 2012-11-13 DIAGNOSIS — Z510 Encounter for antineoplastic radiation therapy: Secondary | ICD-10-CM | POA: Diagnosis not present

## 2012-11-13 DIAGNOSIS — C61 Malignant neoplasm of prostate: Secondary | ICD-10-CM | POA: Diagnosis not present

## 2012-11-14 DIAGNOSIS — Z510 Encounter for antineoplastic radiation therapy: Secondary | ICD-10-CM | POA: Diagnosis not present

## 2012-11-14 DIAGNOSIS — C61 Malignant neoplasm of prostate: Secondary | ICD-10-CM | POA: Diagnosis not present

## 2012-11-17 DIAGNOSIS — C61 Malignant neoplasm of prostate: Secondary | ICD-10-CM | POA: Diagnosis not present

## 2012-11-17 DIAGNOSIS — Z510 Encounter for antineoplastic radiation therapy: Secondary | ICD-10-CM | POA: Diagnosis not present

## 2012-11-19 DIAGNOSIS — I4891 Unspecified atrial fibrillation: Secondary | ICD-10-CM | POA: Diagnosis not present

## 2012-11-22 DIAGNOSIS — Z6830 Body mass index (BMI) 30.0-30.9, adult: Secondary | ICD-10-CM | POA: Diagnosis not present

## 2012-11-22 DIAGNOSIS — J069 Acute upper respiratory infection, unspecified: Secondary | ICD-10-CM | POA: Diagnosis not present

## 2012-11-22 DIAGNOSIS — J209 Acute bronchitis, unspecified: Secondary | ICD-10-CM | POA: Diagnosis not present

## 2012-12-15 DIAGNOSIS — I4891 Unspecified atrial fibrillation: Secondary | ICD-10-CM | POA: Diagnosis not present

## 2012-12-24 DIAGNOSIS — R55 Syncope and collapse: Secondary | ICD-10-CM | POA: Diagnosis not present

## 2012-12-29 DIAGNOSIS — Z923 Personal history of irradiation: Secondary | ICD-10-CM | POA: Diagnosis not present

## 2012-12-29 DIAGNOSIS — C61 Malignant neoplasm of prostate: Secondary | ICD-10-CM | POA: Diagnosis not present

## 2013-01-02 ENCOUNTER — Ambulatory Visit (HOSPITAL_COMMUNITY)
Admission: RE | Admit: 2013-01-02 | Discharge: 2013-01-02 | Disposition: A | Discharge status: Home / Self Care | Payer: Medicare Other | Source: Ambulatory Visit | Attending: Family Medicine | Admitting: Family Medicine

## 2013-01-02 ENCOUNTER — Other Ambulatory Visit (HOSPITAL_COMMUNITY): Payer: Self-pay | Admitting: Family Medicine

## 2013-01-02 DIAGNOSIS — J209 Acute bronchitis, unspecified: Secondary | ICD-10-CM | POA: Insufficient documentation

## 2013-01-02 DIAGNOSIS — R062 Wheezing: Secondary | ICD-10-CM | POA: Insufficient documentation

## 2013-01-09 ENCOUNTER — Ambulatory Visit: Payer: Self-pay | Admitting: Cardiovascular Disease

## 2013-01-09 DIAGNOSIS — Z7901 Long term (current) use of anticoagulants: Secondary | ICD-10-CM | POA: Insufficient documentation

## 2013-01-21 DIAGNOSIS — I4891 Unspecified atrial fibrillation: Secondary | ICD-10-CM | POA: Diagnosis not present

## 2013-01-29 ENCOUNTER — Other Ambulatory Visit: Payer: Self-pay | Admitting: Cardiovascular Disease

## 2013-02-03 DIAGNOSIS — D65 Disseminated intravascular coagulation [defibrination syndrome]: Secondary | ICD-10-CM | POA: Diagnosis not present

## 2013-02-03 DIAGNOSIS — Z79899 Other long term (current) drug therapy: Secondary | ICD-10-CM | POA: Diagnosis not present

## 2013-02-09 ENCOUNTER — Encounter (HOSPITAL_COMMUNITY): Payer: Self-pay | Admitting: Gastroenterology

## 2013-02-09 ENCOUNTER — Encounter (HOSPITAL_COMMUNITY)
Admission: RE | Disposition: A | Discharge status: Home / Self Care | Payer: Self-pay | Source: Ambulatory Visit | Attending: Cardiovascular Disease

## 2013-02-09 ENCOUNTER — Ambulatory Visit (HOSPITAL_COMMUNITY): Payer: Medicare Other | Admitting: Certified Registered Nurse Anesthetist

## 2013-02-09 ENCOUNTER — Ambulatory Visit (HOSPITAL_COMMUNITY)
Admission: RE | Admit: 2013-02-09 | Discharge: 2013-02-09 | Disposition: A | Discharge status: Home / Self Care | Payer: Medicare Other | Source: Ambulatory Visit | Attending: Cardiovascular Disease | Admitting: Cardiovascular Disease

## 2013-02-09 ENCOUNTER — Encounter (HOSPITAL_COMMUNITY): Payer: Self-pay | Admitting: Certified Registered Nurse Anesthetist

## 2013-02-09 DIAGNOSIS — I4891 Unspecified atrial fibrillation: Secondary | ICD-10-CM | POA: Diagnosis not present

## 2013-02-09 DIAGNOSIS — Z7901 Long term (current) use of anticoagulants: Secondary | ICD-10-CM | POA: Diagnosis not present

## 2013-02-09 DIAGNOSIS — I2510 Atherosclerotic heart disease of native coronary artery without angina pectoris: Secondary | ICD-10-CM | POA: Insufficient documentation

## 2013-02-09 DIAGNOSIS — M1990 Unspecified osteoarthritis, unspecified site: Secondary | ICD-10-CM | POA: Diagnosis not present

## 2013-02-09 DIAGNOSIS — I10 Essential (primary) hypertension: Secondary | ICD-10-CM | POA: Insufficient documentation

## 2013-02-09 DIAGNOSIS — E119 Type 2 diabetes mellitus without complications: Secondary | ICD-10-CM | POA: Insufficient documentation

## 2013-02-09 DIAGNOSIS — Z9889 Other specified postprocedural states: Secondary | ICD-10-CM

## 2013-02-09 DIAGNOSIS — Z950 Presence of cardiac pacemaker: Secondary | ICD-10-CM | POA: Diagnosis not present

## 2013-02-09 DIAGNOSIS — Z9289 Personal history of other medical treatment: Secondary | ICD-10-CM

## 2013-02-09 HISTORY — DX: Personal history of other medical treatment: Z92.89

## 2013-02-09 HISTORY — DX: Type 2 diabetes mellitus without complications: E11.9

## 2013-02-09 HISTORY — PX: CARDIOVERSION: SHX1299

## 2013-02-09 HISTORY — DX: Other specified postprocedural states: Z98.890

## 2013-02-09 LAB — GLUCOSE, CAPILLARY: Glucose-Capillary: 109 mg/dL — ABNORMAL HIGH (ref 70–99)

## 2013-02-09 SURGERY — CARDIOVERSION
Anesthesia: Monitor Anesthesia Care

## 2013-02-09 MED ORDER — SODIUM CHLORIDE 0.45 % IV SOLN
INTRAVENOUS | Status: DC
  Administered 2013-02-09: 500 mL via INTRAVENOUS

## 2013-02-09 MED ORDER — PROPOFOL 10 MG/ML IV BOLUS
INTRAVENOUS | Status: DC | PRN
  Administered 2013-02-09: 50 mg via INTRAVENOUS

## 2013-02-09 MED ORDER — ASPIRIN 81 MG PO TABS: 81.0000 mg | ORAL_TABLET | Freq: Every day | ORAL | Status: DC

## 2013-02-09 NOTE — Preoperative (Signed)
Beta Blockers   Reason not to administer Beta Blockers:Not Applicable  Pt took am metoprolol. 

## 2013-02-09 NOTE — H&P (Signed)
Date of Initial H&P: 02/04/2013  History reviewed, patient examined, no change in status, stable for surgery. Symptomatic atrial flutter with RVR/atrial fibrillation at times in a patient with a dual chamber PPM for tachy-brady syndrome. Attempts at overdrive pacing today for slow atrial flutter w CL of approximately 600 ms were unsuccessful. Tried burst atrial pacing at CL of 500, 450, 400 and 350 ms, no change in arrhythmia (possibly an ectopic focus or a left atrial flutter far removed from the RA pacing lead location). Proceed with synchronized electrical cardioversion as planned. This procedure has been fully reviewed with the patient and written informed consent has been obtained.  Thurmon Fair, MD, [facility] [phone] office [phone] pager

## 2013-02-09 NOTE — Transfer of Care (Signed)
Immediate Anesthesia Transfer of Care Note  Patient: [name]  Procedure(s) Performed: Procedure(s): CARDIOVERSION (N/A)  Patient Location: PACU and Endoscopy Unit  Anesthesia Type:MAC  Level of Consciousness: awake  Airway & Oxygen Therapy: Patient Spontanous Breathing and Patient connected to nasal cannula oxygen  Post-op Assessment: Report given to PACU RN, Post -op Vital signs reviewed and stable and Patient moving all extremities X 4  Post vital signs: Reviewed and stable  Complications: No apparent anesthesia complications

## 2013-02-09 NOTE — Anesthesia Preprocedure Evaluation (Addendum)
Anesthesia Evaluation  Patient identified by MRN, date of birth, ID band Patient awake    Reviewed: Allergy & Precautions, H&P , NPO status , Patient's Chart, lab work & pertinent test results, reviewed documented beta blocker date and time   History of Anesthesia Complications Negative for: history of anesthetic complications  Airway Mallampati: II TM Distance: >3 FB Neck ROM: Full    Dental  (+) Dental Advisory Given   Pulmonary pneumonia -, resolved,    Pulmonary exam normal       Cardiovascular hypertension, Pt. on medications and Pt. on home beta blockers + CAD + dysrhythmias Atrial Fibrillation + pacemaker Rhythm:Irregular Rate:Tachycardia  Bradycardia A.fib + Coumadin   Neuro/Psych negative neurological ROS  negative psych ROS   GI/Hepatic negative GI ROS, Neg liver ROS,   Endo/Other  diabetes, Well Controlled, Type 2  Renal/GU Renal diseaseHistory of Acute Renal Failure Creat = 1.14     Musculoskeletal  (+) Arthritis -, Osteoarthritis,    Abdominal Normal abdominal exam  (+)   Peds  Hematology negative hematology ROS (+)   Anesthesia Other Findings   Reproductive/Obstetrics                          Anesthesia Physical Anesthesia Plan  ASA: II  Anesthesia Plan: MAC   Post-op Pain Management:    Induction: Intravenous  Airway Management Planned: Mask  Additional Equipment:   Intra-op Plan:   Post-operative Plan:   Informed Consent: I have reviewed the patients History and Physical, chart, labs and discussed the procedure including the risks, benefits and alternatives for the proposed anesthesia with the patient or authorized representative who has indicated his/her understanding and acceptance.   Dental advisory given  Plan Discussed with: CRNA, Anesthesiologist and Surgeon  Anesthesia Plan Comments:         Anesthesia Quick Evaluation

## 2013-02-09 NOTE — CV Procedure (Signed)
[name], [name] Male, 79 y.o., December 18, 1933  Location: [facility] ENDOSCOPY  MRN: [id number]  CSN: [id number]  Admit Dt: 02/09/13   Procedure: Electrical Cardioversion Indications:  Atrial Flutter  Procedure Details:  Consent: Risks of procedure as well as the alternatives and risks of each were explained to the (patient/caregiver).  Consent for procedure obtained.  Time Out: Verified patient identification, verified procedure, site/side was marked, verified correct patient position, special equipment/implants available, medications/allergies/relevent history reviewed, required imaging and test results available.  Performed  Patient placed on cardiac monitor, pulse oximetry, supplemental oxygen as necessary.  Sedation given: 50 mg IV Propofol, Dr. Janean Sark Pacer pads placed anterior and posterior chest.  Cardioverted 1 time(s).  Cardioverted at 150J. Synchronized biphasic  Evaluation: Findings: Post procedure EKG shows: A-V sequential pacing Complications: None Patient did tolerate procedure well.  Time Spent Directly with the Patient:  30 minutes   Kali Ambler 02/09/2013, 1:59 PM

## 2013-02-09 NOTE — Anesthesia Postprocedure Evaluation (Signed)
  Anesthesia Post-op Note  Patient: [name]  Procedure(s) Performed: Procedure(s): CARDIOVERSION (N/A)  Patient Location: PACU and Endoscopy Unit  Anesthesia Type:MAC  Level of Consciousness: awake and oriented  Airway and Oxygen Therapy: Patient Spontanous Breathing and Patient connected to nasal cannula oxygen  Post-op Pain: none  Post-op Assessment: Post-op Vital signs reviewed, Patient's Cardiovascular Status Stable, Respiratory Function Stable, Patent Airway, No signs of Nausea or vomiting, Adequate PO intake and Pain level controlled  Post-op Vital Signs: Reviewed and stable  Complications: No apparent anesthesia complications

## 2013-02-10 ENCOUNTER — Encounter (HOSPITAL_COMMUNITY): Payer: Self-pay | Admitting: Cardiovascular Disease

## 2013-02-11 DIAGNOSIS — I4891 Unspecified atrial fibrillation: Secondary | ICD-10-CM | POA: Diagnosis not present

## 2013-02-12 DIAGNOSIS — M1710 Unilateral primary osteoarthritis, unspecified knee: Secondary | ICD-10-CM | POA: Diagnosis not present

## 2013-02-16 DIAGNOSIS — C61 Malignant neoplasm of prostate: Secondary | ICD-10-CM | POA: Diagnosis not present

## 2013-02-16 DIAGNOSIS — E291 Testicular hypofunction: Secondary | ICD-10-CM | POA: Diagnosis not present

## 2013-02-21 DIAGNOSIS — I4891 Unspecified atrial fibrillation: Secondary | ICD-10-CM | POA: Diagnosis not present

## 2013-02-21 LAB — PACEMAKER DEVICE OBSERVATION

## 2013-02-23 ENCOUNTER — Encounter: Payer: Self-pay | Admitting: *Deleted

## 2013-02-24 ENCOUNTER — Other Ambulatory Visit (HOSPITAL_COMMUNITY): Payer: Self-pay | Admitting: Family Medicine

## 2013-02-24 ENCOUNTER — Ambulatory Visit (HOSPITAL_COMMUNITY)
Admission: RE | Admit: 2013-02-24 | Discharge: 2013-02-24 | Disposition: A | Discharge status: Home / Self Care | Payer: Medicare Other | Source: Ambulatory Visit | Attending: Family Medicine | Admitting: Family Medicine

## 2013-02-24 DIAGNOSIS — R05 Cough: Secondary | ICD-10-CM | POA: Insufficient documentation

## 2013-02-24 DIAGNOSIS — J019 Acute sinusitis, unspecified: Secondary | ICD-10-CM | POA: Diagnosis not present

## 2013-02-24 DIAGNOSIS — Z950 Presence of cardiac pacemaker: Secondary | ICD-10-CM | POA: Insufficient documentation

## 2013-02-24 DIAGNOSIS — R0989 Other specified symptoms and signs involving the circulatory and respiratory systems: Secondary | ICD-10-CM | POA: Diagnosis not present

## 2013-02-24 DIAGNOSIS — R059 Cough, unspecified: Secondary | ICD-10-CM | POA: Insufficient documentation

## 2013-02-24 DIAGNOSIS — J209 Acute bronchitis, unspecified: Secondary | ICD-10-CM

## 2013-02-24 DIAGNOSIS — Z6831 Body mass index (BMI) 31.0-31.9, adult: Secondary | ICD-10-CM | POA: Diagnosis not present

## 2013-03-06 NOTE — Addendum Note (Signed)
Addendum created 03/06/13 1114 by Kipp Brood, MD   Modules edited: Anesthesia Responsible Staff

## 2013-03-12 DIAGNOSIS — I4891 Unspecified atrial fibrillation: Secondary | ICD-10-CM | POA: Diagnosis not present

## 2013-03-31 ENCOUNTER — Encounter: Payer: Self-pay | Admitting: *Deleted

## 2013-03-31 DIAGNOSIS — M1710 Unilateral primary osteoarthritis, unspecified knee: Secondary | ICD-10-CM | POA: Diagnosis not present

## 2013-04-09 ENCOUNTER — Other Ambulatory Visit: Payer: Self-pay | Admitting: Cardiovascular Disease

## 2013-04-13 ENCOUNTER — Ambulatory Visit (INDEPENDENT_AMBULATORY_CARE_PROVIDER_SITE_OTHER): Payer: Medicare Other | Admitting: Pharmacist Clinician (PhC)/ Clinical Pharmacy Specialist

## 2013-04-13 ENCOUNTER — Ambulatory Visit: Payer: Medicare Other

## 2013-04-13 VITALS — BP 116/80 | HR 80

## 2013-04-13 DIAGNOSIS — Z7901 Long term (current) use of anticoagulants: Secondary | ICD-10-CM

## 2013-04-13 DIAGNOSIS — I4891 Unspecified atrial fibrillation: Secondary | ICD-10-CM

## 2013-04-13 LAB — POCT INR: INR: 2.5

## 2013-04-20 ENCOUNTER — Other Ambulatory Visit: Payer: Self-pay | Admitting: Cardiovascular Disease

## 2013-04-20 DIAGNOSIS — I495 Sick sinus syndrome: Secondary | ICD-10-CM

## 2013-04-20 LAB — PACEMAKER DEVICE OBSERVATION

## 2013-04-21 ENCOUNTER — Ambulatory Visit (INDEPENDENT_AMBULATORY_CARE_PROVIDER_SITE_OTHER): Payer: Medicare Other | Admitting: Physician Assistant

## 2013-04-21 ENCOUNTER — Encounter: Payer: Self-pay | Admitting: Physician Assistant

## 2013-04-21 VITALS — BP 102/82 | HR 89 | Ht 66.0 in | Wt 199.5 lb

## 2013-04-21 DIAGNOSIS — R9431 Abnormal electrocardiogram [ECG] [EKG]: Secondary | ICD-10-CM | POA: Diagnosis not present

## 2013-04-21 DIAGNOSIS — E8770 Fluid overload, unspecified: Secondary | ICD-10-CM

## 2013-04-21 DIAGNOSIS — I4891 Unspecified atrial fibrillation: Secondary | ICD-10-CM

## 2013-04-21 DIAGNOSIS — R0989 Other specified symptoms and signs involving the circulatory and respiratory systems: Secondary | ICD-10-CM | POA: Diagnosis not present

## 2013-04-21 DIAGNOSIS — I10 Essential (primary) hypertension: Secondary | ICD-10-CM

## 2013-04-21 DIAGNOSIS — E8779 Other fluid overload: Secondary | ICD-10-CM

## 2013-04-21 DIAGNOSIS — I480 Paroxysmal atrial fibrillation: Secondary | ICD-10-CM

## 2013-04-21 DIAGNOSIS — Z950 Presence of cardiac pacemaker: Secondary | ICD-10-CM

## 2013-04-21 DIAGNOSIS — R0609 Other forms of dyspnea: Secondary | ICD-10-CM

## 2013-04-21 DIAGNOSIS — R0600 Dyspnea, unspecified: Secondary | ICD-10-CM

## 2013-04-21 LAB — REMOTE PACEMAKER DEVICE
AL IMPEDENCE PM: 433 Ohm
AL THRESHOLD: 2 V
ATRIAL PACING PM: 87.9
BATTERY VOLTAGE: 2.71 V
RV LEAD IMPEDENCE PM: 554 Ohm
VENTRICULAR PACING PM: 99.5

## 2013-04-21 LAB — COMPREHENSIVE METABOLIC PANEL
Alkaline Phosphatase: 33 U/L — ABNORMAL LOW (ref 39–117)
BUN: 28 mg/dL — ABNORMAL HIGH (ref 6–23)
CO2: 23 mEq/L (ref 19–32)
Glucose, Bld: 163 mg/dL — ABNORMAL HIGH (ref 70–99)
Sodium: 141 mEq/L (ref 135–145)
Total Bilirubin: 0.8 mg/dL (ref 0.3–1.2)
Total Protein: 6.3 g/dL (ref 6.0–8.3)

## 2013-04-21 LAB — MAGNESIUM: Magnesium: 1.8 mg/dL (ref 1.5–2.5)

## 2013-04-21 LAB — BRAIN NATRIURETIC PEPTIDE: Brain Natriuretic Peptide: 1006.3 pg/mL — ABNORMAL HIGH (ref 0.0–100.0)

## 2013-04-21 MED ORDER — FUROSEMIDE 40 MG PO TABS: 40.0000 mg | ORAL_TABLET | Freq: Every day | ORAL | Status: DC

## 2013-04-21 NOTE — Assessment & Plan Note (Addendum)
Patient is clearly volume overloaded, possibly due to low output, with distended and tense abdomen.  Though he does not report orthopnea or PND, he does report a 10 pound weight gain in the last week. He'll be started on Lasix 40 mg daily. I asked him to eliminate salt from his diet. Continue to weigh himself on a daily basis and make sure his weight is decreasing. Check a CMP, BNP, 2-D echocardiogram.

## 2013-04-21 NOTE — Progress Notes (Signed)
Date:  04/21/2013   ID:  [name], DOB [birth year], MRN [id number]  PCP:  Colette Ribas, MD  Primary Cardiologist:  [facility]    History of Present Illness: [name] is a 79 y.o. male a history of persistent atrial fibrillation and dual-chamber permanent pacemaker implanted in 2007 for tachybradycardia syndrome. Device is a Medtronic adapta. He is anticoagulated with Coumadin. His history also includes type 2 diabetes mellitus, obesity, systemic hypertension, gout, prostate cancer for which he underwent radiation therapy.  He also had a DC cardioversion on 02/09/2013.   Patient presents today with a 10 pound weight gain in the last week. He also reports feeling weak with no energy and with abdominal distention and lower extremity edema.  He said he checked his pulse at one point and now is down in the 30s.  He does deny orthopnea, PND, fever, nausea, vomiting, abdominal pain, dysuria, hematuria, hematochezia, melena, dizziness.  Wt Readings from Last 3 Encounters:  04/21/13 199 lb 8 oz (90.493 kg)  02/09/13 185 lb (83.915 kg)  02/09/13 185 lb (83.915 kg)     Past Medical History  Diagnosis Date  . Pacemaker     medtronic  . CAD (coronary artery disease)     echo 09-05-2010-EF nl, mod-severe dilated Left atrium; myoview 02-08-12-EF 52%, no ischemia  . Atrial fibrillation   . HTN (hypertension)   . Barrett's esophagus   . Gout   . Arthritis   . Diabetes mellitus without complication   . History of cardioversion 02/09/13    electrical synchronized cardioversion, on flecainide and warfarin  . H/O transesophageal echocardiography (TEE) for monitoring 09/05/10    tee guided AT pace termination, did not proceed with cardioversion due to termination of the atrial flutter through his pacemaker    Current Outpatient Prescriptions  Medication Sig Dispense Refill  . albuterol (PROVENTIL HFA;VENTOLIN HFA) 108 (90 BASE) MCG/ACT inhaler Inhale 2 puffs into the lungs every 6  (six) hours as needed for wheezing or shortness of breath.  1 Inhaler  1  . allopurinol (ZYLOPRIM) 100 MG tablet Take 100 mg by mouth daily.        . Calcium Carbonate-Vitamin D (CALTRATE 600+D) 600-400 MG-UNIT per tablet Take 1 tablet by mouth every other day.        . flecainide (TAMBOCOR) 150 MG tablet Take 150 mg by mouth 2 (two) times daily.        . metoprolol succinate (TOPROL-XL) 25 MG 24 hr tablet Take 25 mg by mouth every morning.        . pantoprazole (PROTONIX) 40 MG tablet TAKE 1 TABLET DAILY  90 tablet  1  . POTASSIUM GLUCONATE PO Take 1 tablet by mouth every other day.        . warfarin (COUMADIN) 2.5 MG tablet Take 2.5 mg by mouth daily. Tues,W,Thurs,F,S,S      . furosemide (LASIX) 40 MG tablet Take 1 tablet (40 mg total) by mouth daily.  30 tablet  5   No current facility-administered medications for this visit.    Allergies:    Allergies  Allergen Reactions  . No Known Allergies     Social History:  The patient  reports that he has quit smoking. He has never used smokeless tobacco. He reports that he does not drink alcohol or use illicit drugs.   ROS:  Please see the history of present illness.  All other systems reviewed and negative.   PHYSICAL EXAM: VS:  BP  102/82  Pulse 89  Ht 5\' 6"  (1.676 m)  Wt 199 lb 8 oz (90.493 kg)  BMI 32.22 kg/m2 Well nourished, well developed, in no acute distress HEENT: Pupils are equal round react to light accommodation extraocular movements are intact.  Neck: JVD difficult to tell.No cervical lymphadenopathy. Cardiac: Regular rate and rhythm without murmurs rubs or gallops. Lungs:  clear to auscultation bilaterally, no wheezing, rhonchi or rales Abd: Distended and tense without tenderness. Positive bowel sounds. Ext: 1+ lower extremity edema.  2+ radial and dorsalis pedis pulses. Skin: warm and dry Neuro:  Grossly normal  EKG:    Wide complex paced rhythm which looks like a slow ventricular tachycardia. QTC is severely prolonged  at 598 ms.  ASSESSMENT AND PLAN:  Problem List Items Addressed This Visit   HTN (hypertension) (Chronic)     Blood pressure controlled at this time.    Relevant Medications      furosemide (LASIX) tablet   Long QT interval     QT interval has increased to 598 ms and his EKG almost looks like a paced wide V. Tach. Stop Flecainide.  Check electrolytes including magnesium.    Relevant Medications      furosemide (LASIX) tablet   Other Relevant Orders      Comp Met (CMET)      Magnesium   Pacemaker (Chronic)     Pacemaker interrogation shows no episodes of atrial fibrillation. Max V rate of 124. average of 88. Max A. rate of 142.    Volume overload     Patient is clearly volume overloaded, possibly due to low output, with distended and tense abdomen.  Though he does not report orthopnea or PND, he does report a 10 pound weight gain in the last week. He'll be started on Lasix 40 mg daily. I asked him to eliminate salt from his diet. Continue to weigh himself on a daily basis and make sure his weight is decreasing. Check a CMP, BNP, 2-D echocardiogram.     Other Visit Diagnoses   Dyspnea    -  Primary    Relevant Medications       furosemide (LASIX) tablet    Other Relevant Orders       B Nat Peptide       2D Echocardiogram without contrast    PAF (paroxysmal atrial fibrillation)        Relevant Medications       furosemide (LASIX) tablet    Other Relevant Orders       EKG 12-Lead

## 2013-04-21 NOTE — Assessment & Plan Note (Signed)
Blood pressure controlled at this time °

## 2013-04-21 NOTE — Assessment & Plan Note (Signed)
QT interval has increased to 598 ms and his EKG almost looks like a paced wide V. Tach. Stop Flecainide.  Check electrolytes including magnesium.

## 2013-04-21 NOTE — Patient Instructions (Addendum)
Your next home pacemaker check will be do on 07-22-2013. You may transmit anytime during your appointment day.   Start taking Lasix 40 mg every morning.  Monitor weight every morning for decrease.  We'll schedule for 2-D echocardiogram and had several labs drawn today.  Stop taking and Flecainide.  Follow up in 2 weeks with Dr. Royann Shivers

## 2013-04-21 NOTE — Assessment & Plan Note (Signed)
Pacemaker interrogation shows no episodes of atrial fibrillation. Max V rate of 124. average of 88. Max A. rate of 142.

## 2013-04-23 ENCOUNTER — Other Ambulatory Visit: Payer: Self-pay | Admitting: Cardiovascular Disease

## 2013-04-23 ENCOUNTER — Encounter: Payer: Self-pay | Admitting: Cardiovascular Disease

## 2013-04-23 ENCOUNTER — Other Ambulatory Visit: Payer: Self-pay | Admitting: *Deleted

## 2013-04-23 ENCOUNTER — Ambulatory Visit (INDEPENDENT_AMBULATORY_CARE_PROVIDER_SITE_OTHER): Payer: Medicare Other | Admitting: Cardiovascular Disease

## 2013-04-23 VITALS — BP 120/60 | HR 70 | Resp 18

## 2013-04-23 DIAGNOSIS — I4891 Unspecified atrial fibrillation: Secondary | ICD-10-CM | POA: Diagnosis not present

## 2013-04-23 DIAGNOSIS — I498 Other specified cardiac arrhythmias: Secondary | ICD-10-CM

## 2013-04-23 DIAGNOSIS — E8779 Other fluid overload: Secondary | ICD-10-CM | POA: Diagnosis not present

## 2013-04-23 DIAGNOSIS — R001 Bradycardia, unspecified: Secondary | ICD-10-CM

## 2013-04-23 DIAGNOSIS — E8770 Fluid overload, unspecified: Secondary | ICD-10-CM

## 2013-04-23 LAB — PACEMAKER DEVICE OBSERVATION

## 2013-04-23 MED ORDER — FLECAINIDE ACETATE 150 MG PO TABS: 75.0000 mg | ORAL_TABLET | Freq: Two times a day (BID) | ORAL | Status: DC

## 2013-04-23 NOTE — Assessment & Plan Note (Addendum)
I am concerned that he has done very poorly while in atrial fibrillation in the past and has clinically benefited every time we have done a cardioversion. For long time flecainide has worked well for him but now he has developed two possible contraindications: What appears to be acute heart failure and also possibly transient failure to capture ventricular pacing.   He is due to have an echocardiogram in the near future and testing of his pacemaker today shows excellent pacing thresholds. I have therefore asked him to resume the flecainide at a smaller dose of 75 mg twice a day. If left ventricular ejection fraction is depressed, flecainide should be stopped permanently. Unfortunately, the only reasonable alternative to the flecainide would be amiodarone.  He is on appropriate anticoagulation therapy.

## 2013-04-23 NOTE — Progress Notes (Signed)
Patient ID: [name], male   DOB: 08/04/1934, 79 y.o.   MRN: [id number]     Reason for office visit Congestive heart failure, atrial fibrillation, pacemaker evaluation  [name] is a 79 year old gentleman with recurrent persistent and symptomatic atrial fibrillation despite normal ventricular rates. The device was initially implanted for sinus node dysfunction and tachycardia-bradycardia syndrome but he has subsequently developed AV node disease and now paces the ventricle 100% of the time. This is not a recent occurrence. He has no evidence of structural heart disease by repeated evaluation in the past including a fairly recent nuclear stress test and an echocardiogram last performed in 2011 shown ejection fraction of 55-60%. He recently underwent repeat cardioversion and we increased his dose of flecainide (02/09/2013).   Patient presented a couple of days ago and saw [name] with a 10 pound weight gain over the last week. He also reports feeling weak with no energy and with abdominal distention and lower extremity edema.  He said he checked his pulse at one point and now is down in the 30s.  He does deny orthopnea, PND, fever, nausea, vomiting, abdominal pain, dysuria, hematuria, hematochezia, melena, dizziness.  The electrocardiogram performed at that time shows background atrial fibrillation with complete heart block and ventricular paced rhythm. There are 2 ventricular pacing spikes are not followed by visible QRS complexes suggesting failure to capture. [name] recommended discontinuing the flecainide and he started treatment with a diuretic. [name] is better. He has lost about 5 pounds of fluid.  We did a full interrogation of [name] space maker today and finds normal function. The auto capture thresholds do show a slow trend of increasing ventricular pacing thresholds starting at the beginning of May but the maximum pacing threshold recorded was still within acceptable range and  the output increased to maximum of 2.50 V 0.52 ms pulse width. Today the devices ventricular threshold is excellent at only 1.0 V at 0.52 ms pulse width, comparable to his historical values. Ventricular pacing impedance and sensing are excellent and the atrial lead is functioning normally. Count showed 86% atrial pacing and almost 100% ventricular pacing.       Allergies  Allergen Reactions  . No Known Allergies     Current Outpatient Prescriptions  Medication Sig Dispense Refill  . albuterol (PROVENTIL HFA;VENTOLIN HFA) 108 (90 BASE) MCG/ACT inhaler Inhale 2 puffs into the lungs every 6 (six) hours as needed for wheezing or shortness of breath.  1 Inhaler  1  . allopurinol (ZYLOPRIM) 100 MG tablet Take 100 mg by mouth daily.        . Calcium Carbonate-Vitamin D (CALTRATE 600+D) 600-400 MG-UNIT per tablet Take 1 tablet by mouth every other day.        . flecainide (TAMBOCOR) 150 MG tablet Take 0.5 tablets (75 mg total) by mouth 2 (two) times daily.      . furosemide (LASIX) 40 MG tablet Take 1 tablet (40 mg total) by mouth daily.  30 tablet  5  . metoprolol succinate (TOPROL-XL) 25 MG 24 hr tablet Take 25 mg by mouth every morning.        . pantoprazole (PROTONIX) 40 MG tablet TAKE 1 TABLET DAILY  90 tablet  1  . POTASSIUM GLUCONATE PO Take 1 tablet by mouth every other day.        . warfarin (COUMADIN) 2.5 MG tablet Take 2.5 mg by mouth daily. Tues,W,Thurs,F,S,S       No current facility-administered medications for  this visit.    Past Medical History  Diagnosis Date  . Pacemaker     medtronic  . CAD (coronary artery disease)     echo 09-05-2010-EF nl, mod-severe dilated Left atrium; myoview 02-08-12-EF 52%, no ischemia  . Atrial fibrillation   . HTN (hypertension)   . Barrett's esophagus   . Gout   . Arthritis   . Diabetes mellitus without complication   . History of cardioversion 02/09/13    electrical synchronized cardioversion, on flecainide and warfarin  . H/O  transesophageal echocardiography (TEE) for monitoring 09/05/10    tee guided AT pace termination, did not proceed with cardioversion due to termination of the atrial flutter through his pacemaker    Past Surgical History  Procedure Laterality Date  . Pacemaker insertion      medtronic adapta  . Tonsillectomy    . Appendectomy    . Cardioversion N/A 02/09/2013    Procedure: CARDIOVERSION;  Surgeon: Thurmon Fair, MD;  Location: [facility];  Service: Cardiovascular;  Laterality: N/A;  . Cardiac catheterization  02/18/06    EF 65%, focal napkin ring-like lesion into the prox LAD otherwise normal coronaries    No family history on file.  History   Social History  . Marital Status: Married    Spouse Name: N/A    Number of Children: N/A  . Years of Education: N/A   Occupational History  . Not on file.   Social History Main Topics  . Smoking status: Former Games developer  . Smokeless tobacco: Never Used     Comment: Quit in the 70s  . Alcohol Use: No  . Drug Use: No  . Sexually Active: Not on file   Other Topics Concern  . Not on file   Social History Narrative  . No narrative on file    Review of systems: He still does not feel back to normal but is not as weak and is less dyspneic. His edema has improved. The patient specifically denies any chest pain at rest or with exertion, orthopnea, paroxysmal nocturnal dyspnea, syncope, palpitations, focal neurological deficits, intermittent claudication,cough, hemoptysis or wheezing.  The patient also denies abdominal pain, nausea, vomiting, dysphagia, diarrhea, constipation, polyuria, polydipsia, dysuria, hematuria, frequency, urgency, abnormal bleeding or bruising, fever, chills, unexpected weight changes, mood swings, change in skin or hair texture, change in voice quality, auditory or visual problems, allergic reactions or rashes, new musculoskeletal complaints other than usual "aches and pains".   PHYSICAL EXAM BP 120/60  Pulse 70   Resp 18  General: Alert, oriented x3, no distress Head: no evidence of trauma, PERRL, EOMI, no exophtalmos or lid lag, no myxedema, no xanthelasma; normal ears, nose and oropharynx Neck: normal jugular venous pulsations and no hepatojugular reflux; brisk carotid pulses without delay and no carotid bruits Chest: clear to auscultation, no signs of consolidation by percussion or palpation, normal fremitus, symmetrical and full respiratory excursions Cardiovascular: normal position and quality of the apical impulse, regular rhythm, normal first and toxic we split second heart sounds, no murmurs, rubs or gallops Abdomen: no tenderness or distention, no masses by palpation, no abnormal pulsatility or arterial bruits, normal bowel sounds, no hepatosplenomegaly Extremities: no clubbing, cyanosis; 2+ symmetrical ankle edema; 2+ radial, ulnar and brachial pulses bilaterally; 2+ right femoral, posterior tibial and dorsalis pedis pulses; 2+ left femoral, posterior tibial and dorsalis pedis pulses; no subclavian or femoral bruits Neurological: grossly nonfocal   EKG: Atrial paced/ventricular paced rhythm with a very broad QRS no atrial fibrillation has been  noted since cardioversion. The recent episodes that have been categorized the patient has been atrial fibrillation actually represent episodes of atrial preference pacing competing with atrial tachycardia whose P waves fall in the PVA refractory period: there is no convincing evidence of recurrent atrial fibrillation.  Lipid Panel  No results found for this basename: chol, trig, hdl, cholhdl, vldl, ldlcalc    BMET    Component Value Date/Time   NA 141 04/21/2013 0958   K 4.3 04/21/2013 0958   CL 105 04/21/2013 0958   CO2 23 04/21/2013 0958   GLUCOSE 163* 04/21/2013 0958   BUN 28* 04/21/2013 0958   CREATININE 1.59* 04/21/2013 0958   CREATININE 1.14 05/20/2011 0530   CALCIUM 9.0 04/21/2013 0958   GFRNONAA >60 05/20/2011 0530   GFRAA >60 05/20/2011 0530      ASSESSMENT AND PLAN Atrial fibrillation I am concerned that he has done very poorly while in atrial fibrillation in the past and has clinically benefited every time we have done a cardioversion. For long time flecainide has worked well for him but now he has developed two possible contraindications: What appears to be acute heart failure and also possibly transient failure to capture ventricular pacing. His heart failure was synchronous with a weeklong episode of atrial fibrillation which has now resolved.  He is due to have an echocardiogram in the near future and testing of his pacemaker today shows excellent pacing thresholds. I have therefore asked him to resume the flecainide at a smaller dose of 75 mg twice a day. If left ventricular ejection fraction is depressed, flecainide should be stopped permanently. Unfortunately, the only reasonable alternative to the flecainide would be amiodarone.  He is on appropriate anticoagulation therapy.  Volume overload There has been substantial improvement in his fluid overload although he is still probably 5 pounds above his baseline weight. His electrolytes are within normal range. His creatinine is a little higher but comparable to values recorded within the last 2 years. He is to continue diuretic therapy. The findings on his echocardiogram will make a big difference in further management. The last assessment with Center systolic function showed an ejection fraction of 55-60% 3 years ago.  No orders of the defined types were placed in this encounter.   Meds ordered this encounter  Medications  . flecainide (TAMBOCOR) 150 MG tablet    Sig: Take 0.5 tablets (75 mg total) by mouth 2 (two) times daily.    Junious Silk, MD, [facility] [phone] office [phone] pager

## 2013-04-23 NOTE — Patient Instructions (Addendum)
Decrease Flecainide to 1/2 tablet twice a day.(75mg  twice a day).  Appointment for Echo 04/29/2013.

## 2013-04-23 NOTE — Assessment & Plan Note (Addendum)
There has been substantial improvement in his fluid overload although he is still probably 5 pounds above his baseline weight. His electrolytes are within normal range. His creatinine is a little higher but comparable to values recorded within the last 2 years. He is to continue diuretic therapy. The findings on his echocardiogram will make a big difference in further management. The last assessment of left ventricular systolic function showed an ejection fraction of 55-60% 3 years ago. I'm not sure where that the atrial tachycardia recorded by the device was a symptom of heart failure or a cause of it.  He'll require early followup.

## 2013-04-23 NOTE — Progress Notes (Signed)
In clinic pacemaker interrogation. Normal device function. No changes made this session. 

## 2013-04-24 ENCOUNTER — Encounter: Payer: Self-pay | Admitting: Cardiovascular Disease

## 2013-04-24 LAB — PACEMAKER DEVICE OBSERVATION
AL AMPLITUDE: 5.6 mv
AL IMPEDENCE PM: 459 Ohm
AL THRESHOLD: 0.5 V
BAMS-0001: 120 {beats}/min
BATTERY VOLTAGE: 2.71 V
RV LEAD AMPLITUDE: 15.68 mv

## 2013-04-29 ENCOUNTER — Ambulatory Visit (INDEPENDENT_AMBULATORY_CARE_PROVIDER_SITE_OTHER): Payer: Medicare Other | Admitting: Cardiovascular Disease

## 2013-04-29 ENCOUNTER — Other Ambulatory Visit: Payer: Self-pay | Admitting: Cardiovascular Disease

## 2013-04-29 ENCOUNTER — Ambulatory Visit (HOSPITAL_COMMUNITY)
Admission: RE | Admit: 2013-04-29 | Discharge: 2013-04-29 | Disposition: A | Discharge status: Home / Self Care | Payer: Medicare Other | Source: Ambulatory Visit | Attending: Cardiovascular Disease | Admitting: Cardiovascular Disease

## 2013-04-29 ENCOUNTER — Encounter: Payer: Self-pay | Admitting: Cardiovascular Disease

## 2013-04-29 ENCOUNTER — Ambulatory Visit: Payer: Medicare Other | Admitting: Cardiovascular Disease

## 2013-04-29 VITALS — BP 126/78 | HR 70 | Resp 16 | Ht 66.0 in | Wt 194.2 lb

## 2013-04-29 DIAGNOSIS — I4891 Unspecified atrial fibrillation: Secondary | ICD-10-CM | POA: Insufficient documentation

## 2013-04-29 DIAGNOSIS — R0609 Other forms of dyspnea: Secondary | ICD-10-CM | POA: Diagnosis not present

## 2013-04-29 DIAGNOSIS — R0600 Dyspnea, unspecified: Secondary | ICD-10-CM

## 2013-04-29 DIAGNOSIS — I509 Heart failure, unspecified: Secondary | ICD-10-CM

## 2013-04-29 DIAGNOSIS — N189 Chronic kidney disease, unspecified: Secondary | ICD-10-CM | POA: Insufficient documentation

## 2013-04-29 DIAGNOSIS — I129 Hypertensive chronic kidney disease with stage 1 through stage 4 chronic kidney disease, or unspecified chronic kidney disease: Secondary | ICD-10-CM | POA: Insufficient documentation

## 2013-04-29 DIAGNOSIS — I5032 Chronic diastolic (congestive) heart failure: Secondary | ICD-10-CM | POA: Insufficient documentation

## 2013-04-29 DIAGNOSIS — E119 Type 2 diabetes mellitus without complications: Secondary | ICD-10-CM | POA: Insufficient documentation

## 2013-04-29 DIAGNOSIS — Z950 Presence of cardiac pacemaker: Secondary | ICD-10-CM | POA: Diagnosis not present

## 2013-04-29 DIAGNOSIS — I079 Rheumatic tricuspid valve disease, unspecified: Secondary | ICD-10-CM | POA: Diagnosis not present

## 2013-04-29 DIAGNOSIS — I5043 Acute on chronic combined systolic (congestive) and diastolic (congestive) heart failure: Secondary | ICD-10-CM

## 2013-04-29 DIAGNOSIS — R0989 Other specified symptoms and signs involving the circulatory and respiratory systems: Secondary | ICD-10-CM | POA: Insufficient documentation

## 2013-04-29 LAB — PACEMAKER DEVICE OBSERVATION: BAMS-0001: 120 {beats}/min

## 2013-04-29 NOTE — Progress Notes (Signed)
Patient ID: [name], male   DOB: Jun 02, 1934, 79 y.o.   MRN: [id number]     Reason for office visit Congestive heart failure, atrial fibrillation, pacemaker management  Mr. Fifield has had recent worsening heart failure. There was evidence that flecainide might be interfering with normal pacemaker function. He has a history of persistent atrial fibrillation that is symptomatic even when he has good rate control. He is taking flecainide now for several years but on a visit a couple of weeks ago, the echocardiogram suggested ventricular non-capture possibly due to acutely increased ventricular pacing thresholds. He also developed marked edema and shortness of breath consistent with acute heart failure.  After starting diuretics and reducing the dose of flecainide he is much improved. Has lost about 5-1/2 pounds and only has minimal ankle edema.  Unfortunately, his echocardiogram shows that there has been significant reduction in left ventricular systolic function: his ejection fraction is now down to about 40%.  Mostly the  reduction in systolic function appears to be due to left ventricular dyssynchrony secondary to right ventricular pacing. Otherwise clearcut regional wall motion abnormalities are not seen. He has previously had coronary angiography and had a few isolated 30% lesions. His most recent nuclear stress test in April of 2013 did not show any evidence of ischemia, but at that time the ejection fraction was 52%    For a couple of reasons it seems that the flecainide therapy, which has served him well over the years, is no longer safe.   Allergies  Allergen Reactions  . No Known Allergies     Current Outpatient Prescriptions  Medication Sig Dispense Refill  . albuterol (PROVENTIL HFA;VENTOLIN HFA) 108 (90 BASE) MCG/ACT inhaler Inhale 2 puffs into the lungs every 6 (six) hours as needed for wheezing or shortness of breath.  1 Inhaler  1  . allopurinol (ZYLOPRIM) 100 MG tablet Take  100 mg by mouth daily.        . Calcium Carbonate-Vitamin D (CALTRATE 600+D) 600-400 MG-UNIT per tablet Take 1 tablet by mouth every other day.        . furosemide (LASIX) 40 MG tablet Take 1 tablet (40 mg total) by mouth daily.  30 tablet  5  . metoprolol succinate (TOPROL-XL) 25 MG 24 hr tablet Take 25 mg by mouth every morning.        . pantoprazole (PROTONIX) 40 MG tablet TAKE 1 TABLET DAILY  90 tablet  1  . POTASSIUM GLUCONATE PO Take 1 tablet by mouth daily.       Marland Kitchen warfarin (COUMADIN) 2.5 MG tablet Take 2.5 mg by mouth daily. Tues,W,Thurs,F,S,S       No current facility-administered medications for this visit.    Past Medical History  Diagnosis Date  . Pacemaker     medtronic  . CAD (coronary artery disease)     echo 09-05-2010-EF nl, mod-severe dilated Left atrium; myoview 02-08-12-EF 52%, no ischemia  . Atrial fibrillation   . HTN (hypertension)   . Barrett's esophagus   . Gout   . Arthritis   . Diabetes mellitus without complication   . History of cardioversion 02/09/13    electrical synchronized cardioversion, on flecainide and warfarin  . H/O transesophageal echocardiography (TEE) for monitoring 09/05/10    tee guided AT pace termination, did not proceed with cardioversion due to termination of the atrial flutter through his pacemaker    Past Surgical History  Procedure Laterality Date  . Pacemaker insertion  medtronic adapta  . Tonsillectomy    . Appendectomy    . Cardioversion N/A 02/09/2013    Procedure: CARDIOVERSION;  Surgeon: Thurmon Fair, MD;  Location: [facility];  Service: Cardiovascular;  Laterality: N/A;  . Cardiac catheterization  02/18/06    EF 65%, focal napkin ring-like lesion into the prox LAD otherwise normal coronaries    No family history on file.  History   Social History  . Marital Status: Married    Spouse Name: N/A    Number of Children: N/A  . Years of Education: N/A   Occupational History  . Not on file.   Social History Main  Topics  . Smoking status: Former Games developer  . Smokeless tobacco: Never Used     Comment: Quit in the 70s  . Alcohol Use: No  . Drug Use: No  . Sexually Active: Not on file   Other Topics Concern  . Not on file   Social History Narrative  . No narrative on file    Review of systems: Edema, exertional dyspnea and fatigue have all improved substantially. He has not had syncope. The patient specifically denies any chest pain at rest or with exertion, dyspnea at rest, orthopnea, paroxysmal nocturnal dyspnea, syncope, palpitations, focal neurological deficits, intermittent claudication, lower extremity edema, unexplained weight gain, cough, hemoptysis or wheezing.  The patient also denies abdominal pain, nausea, vomiting, dysphagia, diarrhea, constipation, polyuria, polydipsia, dysuria, hematuria, frequency, urgency, abnormal bleeding or bruising, fever, chills, unexpected weight changes, mood swings, change in skin or hair texture, change in voice quality, auditory or visual problems, allergic reactions or rashes, new musculoskeletal complaints other than usual "aches and pains".   PHYSICAL EXAM BP 126/78  Pulse 70  Resp 16  Ht 5\' 6"  (1.676 m)  Wt 194 lb 3.2 oz (88.089 kg)  BMI 31.36 kg/m2  General: Alert, oriented x3, no distress Head: no evidence of trauma, PERRL, EOMI, no exophtalmos or lid lag, no myxedema, no xanthelasma; normal ears, nose and oropharynx Neck: normal jugular venous pulsations and no hepatojugular reflux; brisk carotid pulses without delay and no carotid bruits Chest: clear to auscultation, no signs of consolidation by percussion or palpation, normal fremitus, symmetrical and full respiratory excursions Cardiovascular: normal position and quality of the apical impulse, regular rhythm, normal first and second heart sounds, no murmurs, rubs or gallops Abdomen: no tenderness or distention, no masses by palpation, no abnormal pulsatility or arterial bruits, normal bowel  sounds, no hepatosplenomegaly Extremities: no clubbing, cyanosis or edema; 2+ radial, ulnar and brachial pulses bilaterally; 2+ right femoral, posterior tibial and dorsalis pedis pulses; 2+ left femoral, posterior tibial and dorsalis pedis pulses; no subclavian or femoral bruits Neurological: grossly nonfocal   EKG:  atrial paced, ventricular paced rhythm. Paced QRS complexes very broad at about 190 ms. QT interval is also substantially prolonged at around 580 ms  Interrogation of his pacemaker currently shows virtually 100% atrial paced ventricular paced rhythm. Histogram distribution is satisfactory. Lead parameters are all normal including pacing thresholds  BMET    Component Value Date/Time   NA 141 04/21/2013 0958   K 4.3 04/21/2013 0958   CL 105 04/21/2013 0958   CO2 23 04/21/2013 0958   GLUCOSE 163* 04/21/2013 0958   BUN 28* 04/21/2013 0958   CREATININE 1.59* 04/21/2013 0958   CREATININE 1.14 05/20/2011 0530   CALCIUM 9.0 04/21/2013 0958   GFRNONAA >60 05/20/2011 0530   GFRAA >60 05/20/2011 0530     ASSESSMENT AND PLAN Acute on chronic combined  systolic and diastolic CHF (congestive heart failure) Is improved substantially following treatment with diuretics. The echo performed today shows likely normal left atrial filling pressure. His ejection fraction is now low at about 40% and class I antiarrhythmic should be avoided. The bulk of left ventricular dysfunction appears to be related to dyssynchrony, secondary to RV pacing. Efforts were made today to diminish the frequency of RV pacing (see below). ACE inhibitors will probably need to be started but no changes were made to his medical regimen today, as well waiting to see what happens when we discontinue flecainide and the results of the pacemaker programming changes performed today. His cardiomyopathy is likely related to negative inotropic effects of flecainide and increased frequency ventricular pacing, but he has a history of minor  coronary atherosclerosis and it is important to exclude progression of coronary disease as we proceed with his treatment. Notes that he is angina free. Neither his functional status nor his left ventricular ejection fraction quite that low yet, but in the long run he may be a candidate for CRT.  Pacemaker Medtronic Adapta shows normal lead parameters by the check performed today as well as a 1 to performed a couple of weeks ago, but his electrocardiogram at the beginning of the month suggested ventricular non-capture.  His paced QRS is extremely broad in this causes dyssynchrony which is likely a major component to his heart failure. We made several changes today that should reduce the frequency of ventricular pacing. His AV delay was taken out to 350 ms. The lower rate limit was reduced to 60 beats per minute. Atrial preference pacing was turned off. I decided not to program him to MVP since the presence of second-degree heart block and the ensuing irregularity an atrial rhythm may actually predispose to increased atrial fibrillation. Flecainide has also been discontinued and this may reduce the frequency of ventricular paced beats. Unfortunately, the same changes in programming may increase the likelihood of recurrent atrial fibrillation.  Atrial fibrillation This was poorly tolerated, but the interventions to treat it had caused more harm than the arrhythmia itself. Flecainide needs to be discontinued and should not be restarted. Alternative antiarrhythmic options include Tikosyn and amiodarone. The amiodarone would also likely increase the frequency of ventricular pacing and consequently may be more harmful than beneficial. Note that the non-paced ventricular complex is still broad, about 136 ms The initiation of Tikosyn might be difficult in the presence of very long QT interval. The risk of pro arrhythmia may exceed his benefit. For the time being have chosen not to treat with any  antiarrhythmic.Marland Kitchen Depending on how soon it for fibrillation recurs and how badly it is tolerated may choose one or other pathway. Electrophysiology consultation will likely be required.   Orders Placed This Encounter  Procedures  . Myocardial Perfusion Imaging  . EKG 12-Lead   STOP FLECAINIDE.  Junious Silk, MD, [facility] [phone] office [phone] pager

## 2013-04-29 NOTE — Assessment & Plan Note (Addendum)
Is improved substantially following treatment with diuretics. The echo performed today shows likely normal left atrial filling pressure. His ejection fraction is now low at about 40% and class I antiarrhythmic should be avoided. The bulk of left ventricular dysfunction appears to be related to dyssynchrony, secondary to RV pacing. Efforts were made today to diminish the frequency of RV pacing (see below). ACE inhibitors will probably need to be started but no changes were made to his medical regimen today, as well waiting to see what happens when we discontinue flecainide and the results of the pacemaker programming changes performed today. His cardiomyopathy is likely related to negative inotropic effects of flecainide and increased frequency ventricular pacing, but he has a history of minor coronary atherosclerosis and it is important to exclude progression of coronary disease as we proceed with his treatment. Notes that he is angina free. Neither his functional status nor his left ventricular ejection fraction quite that low yet, but in the long run he may be a candidate for CRT.

## 2013-04-29 NOTE — Assessment & Plan Note (Addendum)
This was poorly tolerated, but the interventions to treat it had caused more harm than the arrhythmia itself. Flecainide needs to be discontinued and should not be restarted. Alternative antiarrhythmic options include Tikosyn and amiodarone. The amiodarone would also likely increase the frequency of ventricular pacing and consequently may be more harmful than beneficial. Note that the non-paced ventricular complex is still broad, about 136 ms The initiation of Tikosyn might be difficult in the presence of very long QT interval. The risk of pro arrhythmia may exceed his benefit. For the time being have chosen not to treat with any antiarrhythmic.[name] Depending on how soon it for fibrillation recurs and how badly it is tolerated may choose one or other pathway. Electrophysiology consultation will likely be required.

## 2013-04-29 NOTE — Assessment & Plan Note (Signed)
Medtronic Adapta shows normal lead parameters by the check performed today as well as a 1 to performed a couple of weeks ago, but his electrocardiogram at the beginning of the month suggested ventricular non-capture.  His paced QRS is extremely broad in this causes dyssynchrony which is likely a major component to his heart failure. We made several changes today that should reduce the frequency of ventricular pacing. His AV delay was taken out to 350 ms. The lower rate limit was reduced to 60 beats per minute. Atrial preference pacing was turned off. I decided not to program him to MVP since the presence of second-degree heart block and the ensuing irregularity an atrial rhythm may actually predispose to increased atrial fibrillation. Flecainide has also been discontinued and this may reduce the frequency of ventricular paced beats. Unfortunately, the same changes in programming may increase the likelihood of recurrent atrial fibrillation.

## 2013-04-29 NOTE — Patient Instructions (Addendum)
Stop taking flecainide. Your physician has requested that you have a lexiscan myoview (nuclear stress test). For further information please visit [email or web address] Please follow instruction sheet, as given.  Your physician recommends that you weigh, daily, at the same time every day, and in the same amount of clothing. Please record your daily weights on the handout provided and bring it to your next appointment.

## 2013-04-29 NOTE — Progress Notes (Signed)
2D Echo Performed 04/29/2013    Lemoyne Scarpati, RCS  

## 2013-05-07 ENCOUNTER — Ambulatory Visit (HOSPITAL_COMMUNITY)
Admission: RE | Admit: 2013-05-07 | Discharge: 2013-05-07 | Disposition: A | Discharge status: Home / Self Care | Payer: Medicare Other | Source: Ambulatory Visit | Attending: Cardiovascular Disease | Admitting: Cardiovascular Disease

## 2013-05-07 DIAGNOSIS — E119 Type 2 diabetes mellitus without complications: Secondary | ICD-10-CM | POA: Diagnosis not present

## 2013-05-07 DIAGNOSIS — Z87891 Personal history of nicotine dependence: Secondary | ICD-10-CM | POA: Diagnosis not present

## 2013-05-07 DIAGNOSIS — R42 Dizziness and giddiness: Secondary | ICD-10-CM | POA: Insufficient documentation

## 2013-05-07 DIAGNOSIS — I4891 Unspecified atrial fibrillation: Secondary | ICD-10-CM | POA: Diagnosis not present

## 2013-05-07 DIAGNOSIS — Z8249 Family history of ischemic heart disease and other diseases of the circulatory system: Secondary | ICD-10-CM | POA: Diagnosis not present

## 2013-05-07 DIAGNOSIS — E669 Obesity, unspecified: Secondary | ICD-10-CM | POA: Diagnosis not present

## 2013-05-07 DIAGNOSIS — I10 Essential (primary) hypertension: Secondary | ICD-10-CM | POA: Insufficient documentation

## 2013-05-07 DIAGNOSIS — R5381 Other malaise: Secondary | ICD-10-CM | POA: Diagnosis not present

## 2013-05-07 DIAGNOSIS — R0609 Other forms of dyspnea: Secondary | ICD-10-CM | POA: Diagnosis not present

## 2013-05-07 DIAGNOSIS — R0989 Other specified symptoms and signs involving the circulatory and respiratory systems: Secondary | ICD-10-CM | POA: Insufficient documentation

## 2013-05-07 DIAGNOSIS — R5383 Other fatigue: Secondary | ICD-10-CM | POA: Insufficient documentation

## 2013-05-07 MED ORDER — TECHNETIUM TC 99M SESTAMIBI GENERIC - CARDIOLITE
10.8000 | Freq: Once | INTRAVENOUS | Status: AC | PRN
  Administered 2013-05-07: 11 via INTRAVENOUS

## 2013-05-07 MED ORDER — TECHNETIUM TC 99M SESTAMIBI GENERIC - CARDIOLITE
30.6000 | Freq: Once | INTRAVENOUS | Status: AC | PRN
  Administered 2013-05-07: 31 via INTRAVENOUS

## 2013-05-07 MED ORDER — REGADENOSON 0.4 MG/5ML IV SOLN
0.4000 mg | Freq: Once | INTRAVENOUS | Status: AC
  Administered 2013-05-07: 0.4 mg via INTRAVENOUS

## 2013-05-07 NOTE — Procedures (Addendum)
[facility] [address] [place] [address] [phone]  Cardiology Nuclear Med Study  [name] is a 79 y.o. male     MRN : [id number]     DOB: 05/30/1934  Procedure Date: 05/07/2013  Nuclear Med Background Indication for Stress Test:  Evaluation for Ischemia History:  CAD;PACER;A-FIB Cardiac Risk Factors: Family History - CAD, History of Smoking, Hypertension, Lipids, NIDDM and Obesity  Symptoms:  DOE, Fatigue, Light-Headedness and SOB   Nuclear Pre-Procedure Caffeine/Decaff Intake:  10:00pm NPO After: 8:00am   IV Site: R Forearm  IV 0.9% NS with Angio Cath:  22g  Chest Size (in):  44"  IV Started by: Emmit Pomfret, RN  Height: 5\' 6"  (1.676 m)  Cup Size: n/a  BMI:  Body mass index is 29.55 kg/(m^2). Weight:  183 lb (83.008 kg)   Tech Comments:  N/A    Nuclear Med Study 1 or 2 day study: 1 day  Stress Test Type:  Lexiscan  Order Authorizing Provider:  MIHAI CROITOUR,MD   Resting Radionuclide: Technetium 21m Sestamibi  Resting Radionuclide Dose: 10.8 mCi   Stress Radionuclide:  Technetium 78m Sestamibi  Stress Radionuclide Dose: 30.6 mCi           Stress Protocol Rest HR: 60 Stress HR: 60  Rest BP: 158/92 Stress BP: 151/67  Exercise Time (min): n/a METS: n/a   Predicted Max HR: 144 bpm % Max HR: 41.67 bpm Rate Pressure Product: 9480  Dose of Adenosine (mg):  n/a Dose of Lexiscan: 0.4 mg  Dose of Atropine (mg): n/a Dose of Dobutamine: n/a mcg/kg/min (at max HR)  Stress Test Technologist: Esperanza Sheets, CCT Nuclear Technologist: Gonzella Lex, CNMT   Rest Procedure:  Myocardial perfusion imaging was performed at rest 45 minutes following the intravenous administration of Technetium 5m Sestamibi. Stress Procedure:  The patient received IV Lexiscan 0.4 mg over 15-seconds.  Technetium 38m Sestamibi injected at 30-seconds.  There were no significant changes with Lexiscan.  Quantitative spect images were  obtained after a 45 minute delay.  Transient Ischemic Dilatation (Normal <1.22):  1.19 Lung/Heart Ratio (Normal <0.45):  0.33 QGS EDV:  68 ml QGS ESV:  26 ml LV Ejection Fraction: 62%  Signed by .      Rest ECG: NSR - Normal EKG  Stress ECG: No significant change from baseline ECG  QPS Raw Data Images:  Normal; no motion artifact; normal heart/lung ratio. Stress Images:  Normal homogeneous uptake in all areas of the myocardium. Rest Images:  Normal homogeneous uptake in all areas of the myocardium. Subtraction (SDS):  No evidence of ischemia.  Impression Exercise Capacity:  Lexiscan with no exercise. BP Response:  Normal blood pressure response. Clinical Symptoms:  No significant symptoms noted. ECG Impression:  No significant ST segment change suggestive of ischemia. Comparison with Prior Nuclear Study: No significant change from previous study  Overall Impression:  Normal stress nuclear study.  LV Wall Motion:  NL LV Function; NL Wall Motion   Runell Gess, MD  05/07/2013 5:42 PM

## 2013-05-14 DIAGNOSIS — M1710 Unilateral primary osteoarthritis, unspecified knee: Secondary | ICD-10-CM | POA: Diagnosis not present

## 2013-05-18 ENCOUNTER — Ambulatory Visit (INDEPENDENT_AMBULATORY_CARE_PROVIDER_SITE_OTHER): Payer: Medicare Other | Admitting: Pharmacist Clinician (PhC)/ Clinical Pharmacy Specialist

## 2013-05-18 ENCOUNTER — Ambulatory Visit (INDEPENDENT_AMBULATORY_CARE_PROVIDER_SITE_OTHER): Payer: Medicare Other | Admitting: Cardiovascular Disease

## 2013-05-18 ENCOUNTER — Encounter: Payer: Self-pay | Admitting: Cardiovascular Disease

## 2013-05-18 VITALS — BP 148/84 | HR 60 | Resp 16 | Ht 66.0 in | Wt 191.7 lb

## 2013-05-18 VITALS — BP 146/70 | HR 72

## 2013-05-18 DIAGNOSIS — Z7901 Long term (current) use of anticoagulants: Secondary | ICD-10-CM

## 2013-05-18 DIAGNOSIS — I4891 Unspecified atrial fibrillation: Secondary | ICD-10-CM

## 2013-05-18 DIAGNOSIS — I10 Essential (primary) hypertension: Secondary | ICD-10-CM

## 2013-05-18 DIAGNOSIS — Z950 Presence of cardiac pacemaker: Secondary | ICD-10-CM

## 2013-05-18 DIAGNOSIS — I509 Heart failure, unspecified: Secondary | ICD-10-CM

## 2013-05-18 DIAGNOSIS — R9431 Abnormal electrocardiogram [ECG] [EKG]: Secondary | ICD-10-CM

## 2013-05-18 DIAGNOSIS — I5043 Acute on chronic combined systolic (congestive) and diastolic (congestive) heart failure: Secondary | ICD-10-CM

## 2013-05-18 LAB — POCT INR: INR: 2.9

## 2013-05-18 NOTE — Assessment & Plan Note (Signed)
Medtronic dual-chamber device . Program to minimize ventricular pacing

## 2013-05-18 NOTE — Assessment & Plan Note (Signed)
Currently atrial paced rhythm. We have decided to hold off antiarrhythmics due to their side effects including increased percentage of ventricular pacing. There are few options for antiarrhythmic therapy other than amiodarone. Class one antiarrhythmics have led to congestive heart failure and and probably intermittent failure of ventricular pacing capture. Class III antiarrhythmics  (other than amiodarone )should probably be avoided due to the QT interval.  prolongation

## 2013-05-18 NOTE — Progress Notes (Signed)
Patient ID: [name], male   DOB: 1934-06-24, 79 y.o.   MRN: [id number]      Reason for office visit Followup atrial fibrillation and congestive heart failure  [name] is back after we stopped all his antiarrhythmic therapy. He had developed congestive heart failure in the setting of flecainide therapy and increased frequency of ventricular pacing. His ejection fraction had been down to 40%, in large part due to pronounced dyssynchrony in the setting of a very broad QRS complex.   He has improved substantially. He no longer has edema or shortness of breath and appears more energetic. He underwent a nuclear stress test that showed a left ventricular ejection fraction of 62%. His QRS complex is now narrow (native AV conduction) the QT interval has shortened to 470 ms. When he checks his heart rate at home it is almost always exactly 60 beats per minute consistent with atrial pacing at the lower rate limit.    Allergies  Allergen Reactions  . No Known Allergies     Current Outpatient Prescriptions  Medication Sig Dispense Refill  . albuterol (PROVENTIL HFA;VENTOLIN HFA) 108 (90 BASE) MCG/ACT inhaler Inhale 2 puffs into the lungs every 6 (six) hours as needed for wheezing or shortness of breath.  1 Inhaler  1  . allopurinol (ZYLOPRIM) 100 MG tablet Take 100 mg by mouth daily.        . furosemide (LASIX) 40 MG tablet Take 1 tablet (40 mg total) by mouth daily.  30 tablet  5  . metoprolol succinate (TOPROL-XL) 25 MG 24 hr tablet Take 25 mg by mouth every morning.        . pantoprazole (PROTONIX) 40 MG tablet TAKE 1 TABLET DAILY  90 tablet  1  . POTASSIUM GLUCONATE PO Take 1 tablet by mouth daily.       Marland Kitchen warfarin (COUMADIN) 2.5 MG tablet Take 2.5 mg by mouth daily. Tues,W,Thurs,F,S,S      . Calcium Carbonate-Vitamin D (CALTRATE 600+D) 600-400 MG-UNIT per tablet Take 1 tablet by mouth every other day.         No current facility-administered medications for this visit.    Past  Medical History  Diagnosis Date  . Pacemaker     medtronic  . CAD (coronary artery disease)     echo 09-05-2010-EF nl, mod-severe dilated Left atrium; myoview 02-08-12-EF 52%, no ischemia  . Atrial fibrillation   . HTN (hypertension)   . Barrett's esophagus   . Gout   . Arthritis   . Diabetes mellitus without complication   . History of cardioversion 02/09/13    electrical synchronized cardioversion, on flecainide and warfarin  . H/O transesophageal echocardiography (TEE) for monitoring 09/05/10    tee guided AT pace termination, did not proceed with cardioversion due to termination of the atrial flutter through his pacemaker    Past Surgical History  Procedure Laterality Date  . Pacemaker insertion      medtronic adapta  . Tonsillectomy    . Appendectomy    . Cardioversion N/A 02/09/2013    Procedure: CARDIOVERSION;  Surgeon: Thurmon Fair, MD;  Location: [facility];  Service: Cardiovascular;  Laterality: N/A;  . Cardiac catheterization  02/18/06    EF 65%, focal napkin ring-like lesion into the prox LAD otherwise normal coronaries    No family history on file.  History   Social History  . Marital Status: Married    Spouse Name: N/A    Number of Children: N/A  . Years of  Education: N/A   Occupational History  . Not on file.   Social History Main Topics  . Smoking status: Former Games developer  . Smokeless tobacco: Never Used     Comment: Quit in the 70s  . Alcohol Use: No  . Drug Use: No  . Sexually Active: Not on file   Other Topics Concern  . Not on file   Social History Narrative  . No narrative on file    Review of systems: The patient specifically denies any chest pain at rest or with exertion, dyspnea at rest or with usual exertion, orthopnea, paroxysmal nocturnal dyspnea, syncope, palpitations, focal neurological deficits, intermittent claudication, lower extremity edema, unexplained weight gain, cough, hemoptysis or wheezing.  The patient also denies  abdominal pain, nausea, vomiting, dysphagia, diarrhea, constipation, polyuria, polydipsia, dysuria, hematuria, frequency, urgency, abnormal bleeding or bruising, fever, chills, unexpected weight changes, mood swings, change in skin or hair texture, change in voice quality, auditory or visual problems, allergic reactions or rashes, new musculoskeletal complaints other than usual "aches and pains".   PHYSICAL EXAM BP 148/84  Pulse 60  Resp 16  Ht 5\' 6"  (1.676 m)  Wt 191 lb 11.2 oz (86.955 kg)  BMI 30.96 kg/m2 General: Alert, oriented x3, no distress  Head: no evidence of trauma, PERRL, EOMI, no exophtalmos or lid lag, no myxedema, no xanthelasma; normal ears, nose and oropharynx  Neck: normal jugular venous pulsations and no hepatojugular reflux; brisk carotid pulses without delay and no carotid bruits  Chest: clear to auscultation, no signs of consolidation by percussion or palpation, normal fremitus, symmetrical and full respiratory excursions  Cardiovascular: normal position and quality of the apical impulse, regular rhythm, normal first and second heart sounds, no murmurs, rubs or gallops  Abdomen: no tenderness or distention, no masses by palpation, no abnormal pulsatility or arterial bruits, normal bowel sounds, no hepatosplenomegaly  Extremities: no clubbing, cyanosis or edema; 2+ radial, ulnar and brachial pulses bilaterally; 2+ right femoral, posterior tibial and dorsalis pedis pulses; 2+ left femoral, posterior tibial and dorsalis pedis pulses; no subclavian or femoral bruits  Neurological: grossly nonfocal   EKG: Atrial paced ventricular sensed rhythm, LVH with secondary changes, QTC 470 ms   BMET    Component Value Date/Time   NA 141 04/21/2013 0958   K 4.3 04/21/2013 0958   CL 105 04/21/2013 0958   CO2 23 04/21/2013 0958   GLUCOSE 163* 04/21/2013 0958   BUN 28* 04/21/2013 0958   CREATININE 1.59* 04/21/2013 0958   CREATININE 1.14 05/20/2011 0530   CALCIUM 9.0 04/21/2013 0958    GFRNONAA >60 05/20/2011 0530   GFRAA >60 05/20/2011 0530     ASSESSMENT AND PLAN Acute on chronic combined systolic and diastolic CHF (congestive heart failure) Clinically improved, euvolemic, NYHA functional class II.   Long QT interval Greatly improved, his QT interval is now down to 470 ms (native AV conduction with narrow QRS complex).  Atrial fibrillation Currently atrial paced rhythm. We have decided to hold off antiarrhythmics due to their side effects including increased percentage of ventricular pacing. There are few options for antiarrhythmic therapy other than amiodarone. Class one antiarrhythmics have led to congestive heart failure and and probably intermittent failure of ventricular pacing capture. Class III antiarrhythmics  (other than amiodarone )should probably be avoided due to the QT interval.  prolongation  HTN (hypertension) Adequately compensated  Pacemaker Medtronic dual-chamber device . Program to minimize ventricular pacing  [name]  Thurmon Fair, MD, [facility] [phone]  office [phone] pager

## 2013-05-18 NOTE — Patient Instructions (Addendum)
Please decrease Furosemide to 40mg  every other day for two weeks, then discontinue. Your physician recommends that you schedule a follow-up appointment in: 2 months

## 2013-05-18 NOTE — Assessment & Plan Note (Signed)
Adequately compensated

## 2013-05-18 NOTE — Assessment & Plan Note (Signed)
Greatly improved, his QT interval is now down to 470 ms (native AV conduction with narrow QRS complex).

## 2013-05-18 NOTE — Assessment & Plan Note (Signed)
Clinically improved, euvolemic, NYHA functional class II.

## 2013-05-19 ENCOUNTER — Encounter: Payer: Self-pay | Admitting: Cardiovascular Disease

## 2013-05-20 ENCOUNTER — Ambulatory Visit: Payer: Medicare Other | Admitting: Cardiovascular Disease

## 2013-05-21 ENCOUNTER — Encounter: Payer: Medicare Other | Admitting: Cardiovascular Disease

## 2013-05-28 ENCOUNTER — Other Ambulatory Visit: Payer: Self-pay | Admitting: *Deleted

## 2013-05-28 MED ORDER — METOPROLOL SUCCINATE ER 25 MG PO TB24: 25.0000 mg | ORAL_TABLET | ORAL | Status: DC

## 2013-05-29 ENCOUNTER — Other Ambulatory Visit: Payer: Self-pay | Admitting: *Deleted

## 2013-05-29 MED ORDER — METOPROLOL SUCCINATE ER 25 MG PO TB24: 25.0000 mg | ORAL_TABLET | ORAL | Status: DC

## 2013-05-29 NOTE — Telephone Encounter (Signed)
Rx was sent to pharmacy electronically. 

## 2013-06-15 ENCOUNTER — Ambulatory Visit (INDEPENDENT_AMBULATORY_CARE_PROVIDER_SITE_OTHER): Payer: Medicare Other | Admitting: Pharmacist Clinician (PhC)/ Clinical Pharmacy Specialist

## 2013-06-15 VITALS — BP 140/78 | HR 64

## 2013-06-15 DIAGNOSIS — I4891 Unspecified atrial fibrillation: Secondary | ICD-10-CM

## 2013-06-15 DIAGNOSIS — Z7901 Long term (current) use of anticoagulants: Secondary | ICD-10-CM | POA: Diagnosis not present

## 2013-06-15 LAB — POCT INR: INR: 2.3

## 2013-06-29 DIAGNOSIS — Z79899 Other long term (current) drug therapy: Secondary | ICD-10-CM | POA: Diagnosis not present

## 2013-06-29 DIAGNOSIS — Z923 Personal history of irradiation: Secondary | ICD-10-CM | POA: Diagnosis not present

## 2013-06-29 DIAGNOSIS — Z7901 Long term (current) use of anticoagulants: Secondary | ICD-10-CM | POA: Diagnosis not present

## 2013-06-29 DIAGNOSIS — C61 Malignant neoplasm of prostate: Secondary | ICD-10-CM | POA: Diagnosis not present

## 2013-07-07 ENCOUNTER — Other Ambulatory Visit: Payer: Self-pay | Admitting: Cardiovascular Disease

## 2013-07-07 DIAGNOSIS — M1710 Unilateral primary osteoarthritis, unspecified knee: Secondary | ICD-10-CM | POA: Diagnosis not present

## 2013-07-14 ENCOUNTER — Other Ambulatory Visit: Payer: Self-pay | Admitting: Orthopedic Surgery

## 2013-07-14 NOTE — Progress Notes (Signed)
Preoperative surgical orders have been place into the Epic hospital system for [name] on 07/14/2013, 5:05 PM  by Patrica Duel for surgery on 07/27/2013.  Preop Total Knee orders including Experal, PO Tylenol, and IV Decadron as long as there are no contraindications to the above medications. Avel Peace, PA-C

## 2013-07-17 ENCOUNTER — Encounter (HOSPITAL_COMMUNITY): Payer: Self-pay | Admitting: Pharmacy Technician

## 2013-07-20 ENCOUNTER — Ambulatory Visit (INDEPENDENT_AMBULATORY_CARE_PROVIDER_SITE_OTHER): Payer: Medicare Other | Admitting: Cardiovascular Disease

## 2013-07-20 ENCOUNTER — Encounter: Payer: Self-pay | Admitting: Cardiovascular Disease

## 2013-07-20 ENCOUNTER — Ambulatory Visit (INDEPENDENT_AMBULATORY_CARE_PROVIDER_SITE_OTHER): Payer: Medicare Other | Admitting: Pharmacist Clinician (PhC)/ Clinical Pharmacy Specialist

## 2013-07-20 VITALS — BP 168/78 | HR 68 | Ht 66.0 in | Wt 191.4 lb

## 2013-07-20 DIAGNOSIS — I4891 Unspecified atrial fibrillation: Secondary | ICD-10-CM | POA: Diagnosis not present

## 2013-07-20 DIAGNOSIS — Z950 Presence of cardiac pacemaker: Secondary | ICD-10-CM | POA: Diagnosis not present

## 2013-07-20 DIAGNOSIS — Z01810 Encounter for preprocedural cardiovascular examination: Secondary | ICD-10-CM

## 2013-07-20 DIAGNOSIS — Z7901 Long term (current) use of anticoagulants: Secondary | ICD-10-CM | POA: Diagnosis not present

## 2013-07-20 DIAGNOSIS — I509 Heart failure, unspecified: Secondary | ICD-10-CM

## 2013-07-20 DIAGNOSIS — I5043 Acute on chronic combined systolic (congestive) and diastolic (congestive) heart failure: Secondary | ICD-10-CM | POA: Diagnosis not present

## 2013-07-20 DIAGNOSIS — I10 Essential (primary) hypertension: Secondary | ICD-10-CM

## 2013-07-20 DIAGNOSIS — R001 Bradycardia, unspecified: Secondary | ICD-10-CM

## 2013-07-20 DIAGNOSIS — I498 Other specified cardiac arrhythmias: Secondary | ICD-10-CM

## 2013-07-20 LAB — PACEMAKER DEVICE OBSERVATION

## 2013-07-20 NOTE — Assessment & Plan Note (Signed)
This was related to increased frequency of ventricular pacing on higher dose flecainide therapy. LV function has now normalized and he does not have any clinical signs or symptoms of congestive heart

## 2013-07-20 NOTE — Progress Notes (Signed)
In office pacemaker interrogation. Normal device function. No changes made this session. 

## 2013-07-20 NOTE — Assessment & Plan Note (Signed)
Rechecked 10 minutes later his blood pressure was better at 129/72 mm Hg. No changes are made to his chronic medications.

## 2013-07-20 NOTE — Assessment & Plan Note (Signed)
I believe that his risk of major cardiovascular complication with the planned total knee replacement is low.  There is a considerable risk that he will have recurrent atrial fibrillation. It'll be very important that his beta blocker therapy be continued without any interruption including on the day of surgery and all subsequent postop days. He is also at increased risk of deep venous thrombosis of the lower extremities. Warfarin will be briefly interrupted for his surgery. This is more needed by our pharmacist Phillips Hay. Warfarin therapy will be resumed following surgery for prevention of DVT and prevention of embolic events related to atrial fibrillation.

## 2013-07-20 NOTE — Assessment & Plan Note (Signed)
No recent recurrence on monotherapy with beta blockers. Probably any antiarrhythmic we choose lead to increased frequency of ventricular pacing. If the atrial arrhythmia recurs consider referral to electrophysiology for tailored antiarrhythmic therapy versus radiofrequency ablation. If neither is successful, consider upgrade to a biventricular device if he again has heart failure.

## 2013-07-20 NOTE — Patient Instructions (Addendum)
Remote monitoring is used to monitor your Pacemaker of ICD from home. This monitoring reduces the number of office visits required to check your device to one time per year. It allows [place] to keep an eye on the functioning of your device to ensure it is working properly. You are scheduled for a device check from home on 10-20-2013. You may send your transmission at any time that day. If you have a wireless device, the transmission will be sent automatically. After your physician reviews your transmission, you will receive a postcard with your next transmission date.   Your physician recommends that you schedule a follow-up appointment in: June 2015

## 2013-07-20 NOTE — Assessment & Plan Note (Signed)
Medtronic Adapta  dual-chamber device implanted 02/19/2006, with complete and clinic check today. Rate sensor therapy turned on. No other permanent device changes made. Plan CareLink downloads every 3 months. Return to office for a clinic evaluation in 6-9 months. We'll try to synchronize his visit with his wife's appointment since they have to drive a long way from regional [place]

## 2013-07-20 NOTE — Progress Notes (Signed)
Patient ID: [name], male   DOB: December 15, 1933, 79 y.o.   MRN: [id number]     Reason for office visit History of recurrent persistent atrial fibrillation, sinus node dysfunction status post pacemaker Congestive heart failure followup Preoperative cardiac evaluation  [name] is back for routine followup and also preoperative evaluation in anticipation of total right knee replacement.  He has a long-standing history of recurrent paroxysmal and subsequently recurrent persistent atrial fibrillation that has required antiarrhythmic therapy and repeat cardioversion. He tolerated the flecainide for a long time with good results but recently this medication was plagued by several problems. He had breakthrough atrial fibrillation, he had increased pacing thresholds when we tried to augment the dose and he had a markedly prolonged QRS duration and QT interval duration with evidence of pro arrhythmia.  When the dose of antiarrhythmic was increased and the frequency of ventricular pacing went up his left ventricular systolic function also deteriorated down to a minimum of about 40%. This was associated with signs and symptoms of congestive heart failure. Once the flecainide was discontinued there was marked improvement in left ventricle systolic function. A myocardial perfusion study did not show any evidence of abnormalities. His cardiac intrinsic AV conduction improved and he no longer requires ventricular pacing.  Interrogation of his pacemaker today shows that the pacing thresholds have improved and are better than they were a year ago. There has been no atrial fibrillation in the last 3 months. There is virtually no ventricular pacing. He does have a very blunted heart rate histograms consistent with chronotropic incompetence. Rate response sensor driven therapy was turned on today.  These changes are associated with obvious clinical improvement. He denies any shortness of breath, dizziness or  fatigue. He has not had problems with lower showed edema. He denies chest pain.  Allergies  Allergen Reactions  . No Known Allergies    ,  Current Outpatient Prescriptions  Medication Sig Dispense Refill  . albuterol (PROVENTIL HFA;VENTOLIN HFA) 108 (90 BASE) MCG/ACT inhaler Inhale 2 puffs into the lungs every 6 (six) hours as needed for wheezing or shortness of breath.      . allopurinol (ZYLOPRIM) 100 MG tablet Take 100 mg by mouth daily.        . metoprolol succinate (TOPROL-XL) 50 MG 24 hr tablet Take 50 mg by mouth every morning. Take with or immediately following a meal.      . pantoprazole (PROTONIX) 40 MG tablet Take 40 mg by mouth daily.      Marland Kitchen POTASSIUM GLUCONATE PO Take 1 tablet by mouth daily.       Marland Kitchen warfarin (COUMADIN) 2.5 MG tablet Take 2.5 mg by mouth daily. Pt takes all day but does not take on Monday and fridays.       No current facility-administered medications for this visit.    Past Medical History  Diagnosis Date  . Pacemaker     medtronic  . CAD (coronary artery disease)     echo 09-05-2010-EF nl, mod-severe dilated Left atrium; myoview 02-08-12-EF 52%, no ischemia  . Atrial fibrillation   . HTN (hypertension)   . Barrett's esophagus   . Gout   . Arthritis   . Diabetes mellitus without complication   . History of cardioversion 02/09/13    electrical synchronized cardioversion, on flecainide and warfarin  . H/O transesophageal echocardiography (TEE) for monitoring 09/05/10    tee guided AT pace termination, did not proceed with cardioversion due to termination of the atrial flutter through his  pacemaker    Past Surgical History  Procedure Laterality Date  . Pacemaker insertion      medtronic adapta  . Tonsillectomy    . Appendectomy    . Cardioversion N/A 02/09/2013    Procedure: CARDIOVERSION;  Surgeon: Thurmon Fair, MD;  Location: [facility];  Service: Cardiovascular;  Laterality: N/A;  . Cardiac catheterization  02/18/06    EF 65%, focal napkin  ring-like lesion into the prox LAD otherwise normal coronaries    No family history on file.  History   Social History  . Marital Status: Married    Spouse Name: N/A    Number of Children: N/A  . Years of Education: N/A   Occupational History  . Not on file.   Social History Main Topics  . Smoking status: Former Games developer  . Smokeless tobacco: Never Used     Comment: Quit in the 70s  . Alcohol Use: No  . Drug Use: No  . Sexual Activity: Not on file   Other Topics Concern  . Not on file   Social History Narrative  . No narrative on file    Review of systems: Complains of fatigue and knee pain. The patient specifically denies any chest pain at rest or with exertion, dyspnea at rest or with exertion, orthopnea, paroxysmal nocturnal dyspnea, syncope, palpitations, focal neurological deficits, intermittent claudication, lower extremity edema, unexplained weight gain, cough, hemoptysis or wheezing.  The patient also denies abdominal pain, nausea, vomiting, dysphagia, diarrhea, constipation, polyuria, polydipsia, dysuria, hematuria, frequency, urgency, abnormal bleeding or bruising, fever, chills, unexpected weight changes, mood swings, change in skin or hair texture, change in voice quality, auditory or visual problems, allergic reactions or rashes, new musculoskeletal complaints other than usual "aches and pains".   PHYSICAL EXAM BP 168/78  Pulse 68  Ht 5\' 6"  (1.676 m)  Wt 191 lb 6.4 oz (86.818 kg)  BMI 30.91 kg/m2  General: Alert, oriented x3, no distress Head: no evidence of trauma, PERRL, EOMI, no exophtalmos or lid lag, no myxedema, no xanthelasma; normal ears, nose and oropharynx Neck: normal jugular venous pulsations and no hepatojugular reflux; brisk carotid pulses without delay and no carotid bruits Chest: clear to auscultation, no signs of consolidation by percussion or palpation, normal fremitus, symmetrical and full respiratory excursions Cardiovascular: normal  position and quality of the apical impulse, regular rhythm, normal first and second heart sounds, no murmurs, rubs or gallops Abdomen: no tenderness or distention, no masses by palpation, no abnormal pulsatility or arterial bruits, normal bowel sounds, no hepatosplenomegaly Extremities: no clubbing, cyanosis or edema; 2+ radial, ulnar and brachial pulses bilaterally; 2+ right femoral, posterior tibial and dorsalis pedis pulses; 2+ left femoral, posterior tibial and dorsalis pedis pulses; no subclavian or femoral bruits Neurological: grossly nonfocal   EKG: Atrial paced ventricular sensed by pacemaker evaluation  Lipid Panel  No results found for this basename: chol, trig, hdl, cholhdl, vldl, ldlcalc    BMET    Component Value Date/Time   NA 141 04/21/2013 0958   K 4.3 04/21/2013 0958   CL 105 04/21/2013 0958   CO2 23 04/21/2013 0958   GLUCOSE 163* 04/21/2013 0958   BUN 28* 04/21/2013 0958   CREATININE 1.59* 04/21/2013 0958   CREATININE 1.14 05/20/2011 0530   CALCIUM 9.0 04/21/2013 0958   GFRNONAA >60 05/20/2011 0530   GFRAA >60 05/20/2011 0530     ASSESSMENT AND PLAN Acute on chronic combined systolic and diastolic CHF (congestive heart failure) This was related to increased frequency of  ventricular pacing on higher dose flecainide therapy. LV function has now normalized and he does not have any clinical signs or symptoms of congestive heart  Atrial fibrillation No recent recurrence on monotherapy with beta blockers. Probably any antiarrhythmic we choose lead to increased frequency of ventricular pacing. If the atrial arrhythmia recurs consider referral to electrophysiology for tailored antiarrhythmic therapy versus radiofrequency ablation. If neither is successful, consider upgrade to a biventricular device if he again has heart failure.  Pacemaker Medtronic Adapta  dual-chamber device implanted 02/19/2006, with complete and clinic check today. Rate sensor therapy turned on. No other  permanent device changes made. Plan CareLink downloads every 3 months. Return to office for a clinic evaluation in 6-9 months. We'll try to synchronize his visit with his wife's appointment since they have to drive a long way from [place]  HTN (hypertension) Rechecked 10 minutes later his blood pressure was better at 129/72 mm Hg. No changes are made to his chronic medications.  Preoperative cardiovascular examination I believe that his risk of major cardiovascular complication with the planned total knee replacement is low.  There is a considerable risk that he will have recurrent atrial fibrillation. It'll be very important that his beta blocker therapy be continued without any interruption including on the day of surgery and all subsequent postop days. He is also at increased risk of deep venous thrombosis of the lower extremities. Warfarin will be briefly interrupted for his surgery. This is more needed by our pharmacist Phillips Hay. Warfarin therapy will be resumed following surgery for prevention of DVT and prevention of embolic events related to atrial fibrillation.   Junious Silk, MD, [facility] [phone] office [phone] pager

## 2013-07-21 ENCOUNTER — Other Ambulatory Visit (HOSPITAL_COMMUNITY): Payer: Self-pay | Admitting: Orthopedic Surgery

## 2013-07-21 LAB — PACEMAKER DEVICE OBSERVATION
AL AMPLITUDE: 2.8 mv
AL IMPEDENCE PM: 439 Ohm
BAMS-0001: 120 {beats}/min
BATTERY VOLTAGE: 2.72 V
RV LEAD IMPEDENCE PM: 684 Ohm
VENTRICULAR PACING PM: 1.7

## 2013-07-21 NOTE — Progress Notes (Signed)
ekg 05-18-13 epic Cardiac clearance note dr croitoru 07-20-13 epic Stress test 02-08-12 epic Last pacer check note 07-20-13 epic Chest 2 view xray 02-24-13 epic

## 2013-07-22 ENCOUNTER — Encounter (HOSPITAL_COMMUNITY): Payer: Self-pay

## 2013-07-22 ENCOUNTER — Encounter (HOSPITAL_COMMUNITY)
Admission: RE | Admit: 2013-07-22 | Discharge: 2013-07-22 | Disposition: A | Discharge status: Home / Self Care | Payer: Medicare Other | Source: Ambulatory Visit | Attending: Orthopedic Surgery | Admitting: Orthopedic Surgery

## 2013-07-22 DIAGNOSIS — Z01812 Encounter for preprocedural laboratory examination: Secondary | ICD-10-CM | POA: Diagnosis not present

## 2013-07-22 DIAGNOSIS — Z01818 Encounter for other preprocedural examination: Secondary | ICD-10-CM | POA: Insufficient documentation

## 2013-07-22 HISTORY — DX: Personal history of peptic ulcer disease: Z87.11

## 2013-07-22 HISTORY — DX: Personal history of other diseases of the digestive system: Z87.19

## 2013-07-22 HISTORY — DX: Gastro-esophageal reflux disease without esophagitis: K21.9

## 2013-07-22 HISTORY — DX: Personal history of urinary calculi: Z87.442

## 2013-07-22 HISTORY — DX: Malignant (primary) neoplasm, unspecified: C80.1

## 2013-07-22 LAB — URINALYSIS, ROUTINE W REFLEX MICROSCOPIC
Bilirubin Urine: NEGATIVE
Glucose, UA: NEGATIVE mg/dL
Ketones, ur: NEGATIVE mg/dL
Leukocytes, UA: NEGATIVE
Nitrite: NEGATIVE
Protein, ur: NEGATIVE mg/dL
Specific Gravity, Urine: 1.022 (ref 1.005–1.030)
Urobilinogen, UA: 0.2 mg/dL (ref 0.0–1.0)
pH: 5.5 (ref 5.0–8.0)

## 2013-07-22 LAB — COMPREHENSIVE METABOLIC PANEL WITH GFR
ALT: 15 U/L (ref 0–53)
AST: 23 U/L (ref 0–37)
Albumin: 3.7 g/dL (ref 3.5–5.2)
Alkaline Phosphatase: 34 U/L — ABNORMAL LOW (ref 39–117)
BUN: 21 mg/dL (ref 6–23)
CO2: 26 meq/L (ref 19–32)
Calcium: 9.4 mg/dL (ref 8.4–10.5)
Chloride: 102 meq/L (ref 96–112)
Creatinine, Ser: 1.06 mg/dL (ref 0.50–1.35)
GFR calc Af Amer: 76 mL/min — ABNORMAL LOW
GFR calc non Af Amer: 66 mL/min — ABNORMAL LOW
Glucose, Bld: 99 mg/dL (ref 70–99)
Potassium: 4.2 meq/L (ref 3.5–5.1)
Sodium: 138 meq/L (ref 135–145)
Total Bilirubin: 0.5 mg/dL (ref 0.3–1.2)
Total Protein: 6.7 g/dL (ref 6.0–8.3)

## 2013-07-22 LAB — ABO/RH: ABO/RH(D): O POS

## 2013-07-22 LAB — APTT: aPTT: 39 s — ABNORMAL HIGH (ref 24–37)

## 2013-07-22 LAB — CBC
HCT: 39.1 % (ref 39.0–52.0)
Hemoglobin: 12.9 g/dL — ABNORMAL LOW (ref 13.0–17.0)
RBC: 4.35 MIL/uL (ref 4.22–5.81)
WBC: 4.1 10*3/uL (ref 4.0–10.5)

## 2013-07-22 LAB — URINE MICROSCOPIC-ADD ON

## 2013-07-22 LAB — PROTIME-INR: Prothrombin Time: 18.2 seconds — ABNORMAL HIGH (ref 11.6–15.2)

## 2013-07-22 NOTE — Progress Notes (Signed)
07/22/13 1008  OBSTRUCTIVE SLEEP APNEA  Have you ever been diagnosed with sleep apnea through a sleep study? No  Do you snore loudly (loud enough to be heard through closed doors)?  1  Do you often feel tired, fatigued, or sleepy during the daytime? 0  Has anyone observed you stop breathing during your sleep? 0  Do you have, or are you being treated for high blood pressure? 1  BMI more than 35 kg/m2? 0  Age over 77 years old? 1  Neck circumference greater than 40 cm/18 inches? 0  Gender: 1  Obstructive Sleep Apnea Score 4  Score 4 or greater  Results sent to PCP

## 2013-07-22 NOTE — Progress Notes (Signed)
Abnormal UA routed to Dr. Lequita Halt

## 2013-07-22 NOTE — Patient Instructions (Addendum)
[name]  07/22/2013                           YOUR PROCEDURE IS SCHEDULED ON:  07/27/13               PLEASE REPORT TO SHORT STAY CENTER AT : 12:00 PM               CALL THIS NUMBER IF ANY PROBLEMS THE DAY OF SURGERY :               [phone]                      REMEMBER:   Do not eat food or drink liquids AFTER MIDNIGHT  May have clear liquids UNTIL 6 HOURS BEFORE SURGERY  (9:00 AM)  Clear liquids include soda, tea, black coffee, apple or grape juice, broth.  Take these medicines the morning of surgery with A SIP OF WATER:  METOPROLOL / PROTONIX   Do not wear jewelry, make-up   Do not wear lotions, powders, or perfumes.   Do not shave legs or underarms 12 hrs. before surgery (men may shave face)  Do not bring valuables to the hospital.  Contacts, dentures or bridgework may not be worn into surgery.  Leave suitcase in the car. After surgery it may be brought to your room.  For patients admitted to the hospital more than one night, checkout time is 11:00                          The day of discharge.   Patients discharged the day of surgery will not be allowed to drive home                             If going home same day of surgery, must have someone stay with you first                           24 hrs at home and arrange for some one to drive you home from hospital.    Special Instructions:   Please read over the following fact sheets that you were given:               1. MRSA  INFORMATION                      2. [facility] PREPARING FOR SURGERY SHEET               3. INCENTIVE SPIROMETER                                                X_____________________________________________________________________        Failure to follow these instructions may result in cancellation of your surgery

## 2013-07-26 ENCOUNTER — Other Ambulatory Visit: Payer: Self-pay | Admitting: Orthopedic Surgery

## 2013-07-26 NOTE — H&P (Signed)
[name]  DOB: 03-Sep-1934 Married / Language: English / Race: White Male  Date of Admission:  07/27/2013  Chief Complaint:  Right Knee Pain  History of Present Illness The patient is a 79 year old male who comes in for a preoperative History and Physical. The patient is scheduled for a right total knee arthroplasty to be performed by Dr. Gus Rankin. Aluisio, MD at [facility] on 07/27/2013. The patient is a 79 year old male who presents for follow up of their knee. The patient is being followed for their right knee pain and osteoarthritis. They are now 6 week(s) out from from cortisone injection. Symptoms reported today include: pain, stiffness, difficulty ambulating and difficulty arising from chair. The patient has reported improvement of their symptoms with: Cortisone injections (but it only helped for a short time). Unfortunately the knee is as bad now as it has ever been and continues to get worse. The cortisone helped for a very short amount of time. He has had recurrent effusion, recurrent pain. Both knees hurt but the right is worse than the left. He is ready to get the right knee fixed first. They have been treated conservatively in the past for the above stated problem and despite conservative measures, they continue to have progressive pain and severe functional limitations and dysfunction. They have failed non-operative management including home exercise, medications. It is felt that they would benefit from undergoing total joint replacement. Risks and benefits of the procedure have been discussed with the patient and they elect to proceed with surgery. There are no active contraindications to surgery such as ongoing infection or rapidly progressive neurological disease.   Problem List Primary osteoarthritis of both knees (715.16)   Allergies No Known Drug Allergies. 01/01/2013   Family History Diabetes Mellitus. mother, brother and grandmother mothers  side   Social History Tobacco use. former smoker Drug/Alcohol Rehab (Currently). no Drug/Alcohol Rehab (Previously). no Alcohol use. never consumed alcohol Children. 3 Marital status. married Number of flights of stairs before winded. 1 Illicit drug use. no Living situation. live with spouse Post-Surgical Plans. Plan is to go home with his wife and family. Advance Directives. Living Will, Healthcare POA   Medication History Allopurinol (100MG  Tablet, Oral) Active. Metoprolol Succinate ER (50MG  Tablet ER 24HR, Oral) Active. Pantoprazole Sodium (40MG  Tablet DR, Oral) Active. Warfarin Sodium (5MG  Tablet, Oral) Active. Potassium ( Oral) Specific dose unknown - Active.   Past Surgical History Tonsillectomy Pace Maker Atrial fibrillation status post cardioversion (427.31)   Medical History Diabetes Mellitus, Type II Gout High blood pressure Prostate Cancer Impaired Vision Atrial Fibrillation. Paroxsymal Pacemaker   Review of Systems General:Not Present- Chills, Fever, Night Sweats, Fatigue, Weight Gain, Weight Loss and Memory Loss. Skin:Not Present- Hives, Itching, Rash, Eczema and Lesions. HEENT:Not Present- Tinnitus, Headache, Double Vision, Visual Loss, Hearing Loss and Dentures. Respiratory:Not Present- Shortness of breath with exertion, Shortness of breath at rest, Allergies, Coughing up blood and Chronic Cough. Cardiovascular:Not Present- Chest Pain, Racing/skipping heartbeats, Difficulty Breathing Lying Down, Murmur, Swelling and Palpitations. Gastrointestinal:Not Present- Bloody Stool, Heartburn, Abdominal Pain, Vomiting, Nausea, Constipation, Diarrhea, Difficulty Swallowing, Jaundice and Loss of appetitie. Male Genitourinary:Not Present- Urinary frequency, Blood in Urine, Weak urinary stream, Discharge, Flank Pain, Incontinence, Painful Urination, Urgency, Urinary Retention and Urinating at Night. Musculoskeletal:Present- Joint Swelling  and Joint Pain. Not Present- Muscle Weakness, Muscle Pain, Back Pain, Morning Stiffness and Spasms. Neurological:Not Present- Tremor, Dizziness, Blackout spells, Paralysis, Difficulty with balance and Weakness. Psychiatric:Not Present- Insomnia.   Vitals  Weight: 180 lb Height: 66 in Body Surface Area: 1.95 m Body Mass Index: 29.05 kg/m Pulse: 58 (Regular) Resp.: 16 (Unlabored) BP: 152/78 (Sitting, Right Arm, Standard)    Physical Exam The physical exam findings are as follows:   General Mental Status - Alert, cooperative and good historian. General Appearance- pleasant. Not in acute distress. Orientation- Oriented X3. Build & Nutrition- Well nourished and Well developed.   Head and Neck Head- normocephalic, atraumatic . Neck Global Assessment- supple. no bruit auscultated on the right and no bruit auscultated on the left.   Eye Vision- Wears corrective lenses. Pupil- Bilateral- Regular and Round. Motion- Bilateral- EOMI.   Chest and Lung Exam Auscultation: Breath sounds:- clear at anterior chest wall and - clear at posterior chest wall. Adventitious sounds:- No Adventitious sounds.   Cardiovascular Auscultation:Rhythm- Regular rate and rhythm. Heart Sounds- S1 WNL and S2 WNL. Murmurs & Other Heart Sounds:Auscultation of the heart reveals - No Murmurs.   Abdomen Palpation/Percussion:Tenderness- Abdomen is non-tender to palpation. Rigidity (guarding)- Abdomen is soft. Auscultation:Auscultation of the abdomen reveals - Bowel sounds normal.   Male Genitourinary Not done, not pertinent to present illness  Musculoskeletal On exam well developed male, alert and oriented in no apparent distress. Evaluation of his hips, normal range of motion, no discomfort. Left knee no effusion, moderate crepitus on range of motion in the knee, tender medial greater than lateral, no instability, range 5 to 125. Right knee large effusion,  range 5 to 120, marked crepitus on range of motion, tender medial greater than lateral, no instability.  RADIOGRAPHS: AP and lateral both knees show advanced endstage arthritis, bone on bone medial, lateral and patellofemoral in both knees.  Assessment & Plan Primary osteoarthritis of both knees (715.16) Impression: Right greater than Left Knee  Note: Plan is for a Right Total Knee Replacement by Dr. Lequita Halt.  Plan is to go home with his wife and family.  PCP - Dr. Assunta Found  The patient will not receive TXA (tranexamic acid) due to: Prostate Cancer  Signed electronically by Glendon Dunwoody Tessie Fass, III PA-C

## 2013-07-26 NOTE — H&P (Signed)
[name]  DOB: 1934-09-11 Married / Language: English / Race: White Male  Date of Admission:  07/28/2103  Chief Complaint:  07/27/2013  History of Present Illness The patient is a 79 year old male who comes in for a preoperative History and Physical. The patient is scheduled for a right total knee arthroplasty to be performed by Dr. Gus Rankin. Aluisio, MD at [facility] on 07/27/2013. The patient is a 79 year old male who presents for follow up of their knee. The patient is being followed for their right knee pain and osteoarthritis. They are now 6 week(s) out from from cortisone injection. Symptoms reported today include: pain, stiffness, difficulty ambulating and difficulty arising from chair. The patient has reported improvement of their symptoms with: Cortisone injections (but it only helped for a short time). Unfortunately the knee is as bad now as it has ever been and continues to get worse. The cortisone helped for a very short amount of time. He has had recurrent effusion, recurrent pain. Both knees hurt but the right is worse than the left. He is ready to get the right knee fixed first. They have been treated conservatively in the past for the above stated problem and despite conservative measures, they continue to have progressive pain and severe functional limitations and dysfunction. They have failed non-operative management including home exercise, medications. It is felt that they would benefit from undergoing total joint replacement. Risks and benefits of the procedure have been discussed with the patient and they elect to proceed with surgery. There are no active contraindications to surgery such as ongoing infection or rapidly progressive neurological disease.   Problem List Primary osteoarthritis of both knees (715.16)   Allergies No Known Drug Allergies. 01/01/2013   Family History Diabetes Mellitus. mother, brother and grandmother mothers  side   Social History Tobacco use. former smoker Drug/Alcohol Rehab (Currently). no Drug/Alcohol Rehab (Previously). no Alcohol use. never consumed alcohol Children. 3 Marital status. married Number of flights of stairs before winded. 1 Illicit drug use. no Living situation. live with spouse Post-Surgical Plans. Plan is to go home with his wife and family. Advance Directives. Living Will, Healthcare POA   Medication History Allopurinol (100MG  Tablet, Oral) Active. Metoprolol Succinate ER (50MG  Tablet ER 24HR, Oral) Active. Pantoprazole Sodium (40MG  Tablet DR, Oral) Active. Warfarin Sodium (5MG  Tablet, Oral) Active. Potassium ( Oral) Specific dose unknown - Active.   Past Surgical History Tonsillectomy Pace Maker Atrial fibrillation status post cardioversion (427.31)  Medical History Diabetes Mellitus, Type II Gout High blood pressure Prostate Cancer Impaired Vision Atrial Fibrillation. Paroxsymal Pacemaker   Review of Systems General:Not Present- Chills, Fever, Night Sweats, Fatigue, Weight Gain, Weight Loss and Memory Loss. Skin:Not Present- Hives, Itching, Rash, Eczema and Lesions. HEENT:Not Present- Tinnitus, Headache, Double Vision, Visual Loss, Hearing Loss and Dentures. Respiratory:Not Present- Shortness of breath with exertion, Shortness of breath at rest, Allergies, Coughing up blood and Chronic Cough. Cardiovascular:Not Present- Chest Pain, Racing/skipping heartbeats, Difficulty Breathing Lying Down, Murmur, Swelling and Palpitations. Gastrointestinal:Not Present- Bloody Stool, Heartburn, Abdominal Pain, Vomiting, Nausea, Constipation, Diarrhea, Difficulty Swallowing, Jaundice and Loss of appetitie. Male Genitourinary:Not Present- Urinary frequency, Blood in Urine, Weak urinary stream, Discharge, Flank Pain, Incontinence, Painful Urination, Urgency, Urinary Retention and Urinating at Night. Musculoskeletal:Present- Joint Swelling and  Joint Pain. Not Present- Muscle Weakness, Muscle Pain, Back Pain, Morning Stiffness and Spasms. Neurological:Not Present- Tremor, Dizziness, Blackout spells, Paralysis, Difficulty with balance and Weakness. Psychiatric:Not Present- Insomnia.   Vitals Weight: 180 lb  Height: 66 in Body Surface Area: 1.95 m Body Mass Index: 29.05 kg/m Pulse: 58 (Regular) Resp.: 16 (Unlabored) BP: 152/78 (Sitting, Right Arm, Standard)    Physical Exam The physical exam findings are as follows:   General Mental Status - Alert, cooperative and good historian. General Appearance- pleasant. Not in acute distress. Orientation- Oriented X3. Build & Nutrition- Well nourished and Well developed.   Head and Neck Head- normocephalic, atraumatic . Neck Global Assessment- supple. no bruit auscultated on the right and no bruit auscultated on the left.   Eye Vision- Wears corrective lenses. Pupil- Bilateral- Regular and Round. Motion- Bilateral- EOMI.   Chest and Lung Exam Auscultation: Breath sounds:- clear at anterior chest wall and - clear at posterior chest wall. Adventitious sounds:- No Adventitious sounds.   Cardiovascular Auscultation:Rhythm- Regular rate and rhythm. Heart Sounds- S1 WNL and S2 WNL. Murmurs & Other Heart Sounds:Auscultation of the heart reveals - No Murmurs.   Abdomen Palpation/Percussion:Tenderness- Abdomen is non-tender to palpation. Rigidity (guarding)- Abdomen is soft. Auscultation:Auscultation of the abdomen reveals - Bowel sounds normal.   Male Genitourinary  Not done, not pertinent to present illness  Musculoskeletal On exam well developed male, alert and oriented in no apparent distress. Evaluation of his hips, normal range of motion, no discomfort. Left knee no effusion, moderate crepitus on range of motion in the knee, tender medial greater than lateral, no instability, range 5 to 125. Right knee large effusion, range  5 to 120, marked crepitus on range of motion, tender medial greater than lateral, no instability.  RADIOGRAPHS: AP and lateral both knees show advanced endstage arthritis, bone on bone medial, lateral and patellofemoral in both knees.  Assessment & Plan Primary osteoarthritis of both knees (715.16) Impression: Right greater than Left Knee  Note: Plan is for a Right Total Knee Replacement by Dr. Lequita Halt.  Plan is to go home with his wife and family.  PCP - Dr. Assunta Found  The patient will not receive TXA (tranexamic acid) due to: Prostate Cancer  Signed electronically by Alexzandrew Tessie Fass, III PA-C

## 2013-07-27 ENCOUNTER — Encounter (HOSPITAL_COMMUNITY): Admission: RE | Payer: Self-pay | Source: Ambulatory Visit

## 2013-07-27 ENCOUNTER — Inpatient Hospital Stay (HOSPITAL_COMMUNITY): Admission: RE | Admit: 2013-07-27 | Payer: Medicare Other | Source: Ambulatory Visit | Admitting: Orthopedic Surgery

## 2013-07-27 LAB — TYPE AND SCREEN

## 2013-07-27 SURGERY — ARTHROPLASTY, KNEE, TOTAL
Anesthesia: Choice | Site: Knee | Laterality: Right

## 2013-08-17 DIAGNOSIS — C61 Malignant neoplasm of prostate: Secondary | ICD-10-CM | POA: Diagnosis not present

## 2013-08-25 ENCOUNTER — Ambulatory Visit: Payer: Medicare Other | Admitting: Pharmacist Clinician (PhC)/ Clinical Pharmacy Specialist

## 2013-09-01 ENCOUNTER — Ambulatory Visit: Payer: Medicare Other | Admitting: Pharmacist Clinician (PhC)/ Clinical Pharmacy Specialist

## 2013-09-02 ENCOUNTER — Ambulatory Visit (INDEPENDENT_AMBULATORY_CARE_PROVIDER_SITE_OTHER): Payer: Medicare Other | Admitting: Pharmacist Clinician (PhC)/ Clinical Pharmacy Specialist

## 2013-09-02 DIAGNOSIS — Z7901 Long term (current) use of anticoagulants: Secondary | ICD-10-CM | POA: Diagnosis not present

## 2013-09-02 DIAGNOSIS — I4891 Unspecified atrial fibrillation: Secondary | ICD-10-CM

## 2013-09-02 LAB — POCT INR: INR: 2

## 2013-09-16 ENCOUNTER — Telehealth: Payer: Self-pay | Admitting: *Deleted

## 2013-09-16 NOTE — Telephone Encounter (Signed)
Perioperative Rx for implanted cardiac device programming filled out by Dr. Salena Saner and faxed.

## 2013-09-22 ENCOUNTER — Encounter (HOSPITAL_COMMUNITY): Payer: Self-pay | Admitting: Pharmacy Technician

## 2013-09-24 ENCOUNTER — Other Ambulatory Visit: Payer: Self-pay | Admitting: Orthopedic Surgery

## 2013-09-24 ENCOUNTER — Inpatient Hospital Stay (HOSPITAL_COMMUNITY): Admission: RE | Admit: 2013-09-24 | Payer: Medicare Other | Source: Ambulatory Visit

## 2013-09-24 NOTE — Progress Notes (Signed)
Preoperative surgical orders have been place into the Epic hospital system for [name] on 09/24/2013, 10:24 PM  by Patrica Duel for surgery on 10/05/13.  Preop Total Knee orders including Experal, PO Tylenol, and IV Decadron as long as there are no contraindications to the above medications. Avel Peace, PA-C

## 2013-09-25 ENCOUNTER — Encounter (HOSPITAL_COMMUNITY)
Admission: RE | Admit: 2013-09-25 | Discharge: 2013-09-25 | Disposition: A | Discharge status: Home / Self Care | Payer: Medicare Other | Source: Ambulatory Visit | Attending: Orthopedic Surgery | Admitting: Orthopedic Surgery

## 2013-09-25 ENCOUNTER — Encounter (HOSPITAL_COMMUNITY): Payer: Self-pay

## 2013-09-25 DIAGNOSIS — C61 Malignant neoplasm of prostate: Secondary | ICD-10-CM

## 2013-09-25 DIAGNOSIS — Z01812 Encounter for preprocedural laboratory examination: Secondary | ICD-10-CM | POA: Diagnosis not present

## 2013-09-25 DIAGNOSIS — Z950 Presence of cardiac pacemaker: Secondary | ICD-10-CM

## 2013-09-25 DIAGNOSIS — Z01818 Encounter for other preprocedural examination: Secondary | ICD-10-CM | POA: Insufficient documentation

## 2013-09-25 HISTORY — DX: Malignant neoplasm of prostate: C61

## 2013-09-25 HISTORY — DX: Presence of cardiac pacemaker: Z95.0

## 2013-09-25 LAB — CBC
HCT: 39.6 % (ref 39.0–52.0)
Hemoglobin: 13.2 g/dL (ref 13.0–17.0)
MCH: 30.1 pg (ref 26.0–34.0)
MCHC: 33.3 g/dL (ref 30.0–36.0)
MCV: 90.4 fL (ref 78.0–100.0)
RDW: 13.1 % (ref 11.5–15.5)

## 2013-09-25 LAB — URINALYSIS, ROUTINE W REFLEX MICROSCOPIC
Glucose, UA: NEGATIVE mg/dL
Hgb urine dipstick: NEGATIVE
Ketones, ur: NEGATIVE mg/dL
Nitrite: NEGATIVE
Specific Gravity, Urine: 1.031 — ABNORMAL HIGH (ref 1.005–1.030)
Urobilinogen, UA: 0.2 mg/dL (ref 0.0–1.0)
pH: 5 (ref 5.0–8.0)

## 2013-09-25 LAB — COMPREHENSIVE METABOLIC PANEL
Albumin: 3.7 g/dL (ref 3.5–5.2)
BUN: 22 mg/dL (ref 6–23)
CO2: 25 mEq/L (ref 19–32)
Calcium: 9.2 mg/dL (ref 8.4–10.5)
GFR calc Af Amer: 79 mL/min — ABNORMAL LOW (ref >90)
GFR calc non Af Amer: 68 mL/min — ABNORMAL LOW (ref >90)
Glucose, Bld: 116 mg/dL — ABNORMAL HIGH (ref 70–99)
Sodium: 136 mEq/L (ref 135–145)
Total Protein: 6.6 g/dL (ref 6.0–8.3)

## 2013-09-25 LAB — SURGICAL PCR SCREEN
MRSA, PCR: NEGATIVE
Staphylococcus aureus: NEGATIVE

## 2013-09-25 LAB — PROTIME-INR: Prothrombin Time: 25 seconds — ABNORMAL HIGH (ref 11.6–15.2)

## 2013-09-25 NOTE — Patient Instructions (Addendum)
20 SHIVA SAHAGIAN  09/25/2013   Your procedure is scheduled on:  12-1 -2014  Report to [facility] at    0930    AM.  Call this number if you have problems the morning of surgery: [phone]  Or Presurgical Testing [phone](Tayanna Talford)      Do not eat food:After Midnight.  May have clear liquids:up to 6 Hours before arrival. Nothing after : 0600 AM  Clear liquids include soda, tea, black coffee, apple or grape juice, broth.  Take these medicines the morning of surgery with A SIP OF WATER: Tylenol. Metoprolol. Pantoprazole. Stop Warfarin as per MD instructions.   Do not wear jewelry, make-up or nail polish.  Do not wear lotions, powders, or perfumes. You may wear deodorant.  Do not shave 12 hours prior to first CHG shower(legs and under arms).(face and neck okay.)  Do not bring valuables to the hospital.  Contacts, dentures or removable bridgework, body piercing, hair pins may not be worn into surgery.  Leave suitcase in the car. After surgery it may be brought to your room.  For patients admitted to the hospital, checkout time is 11:00 AM the day of discharge.   Patients discharged the day of surgery will not be allowed to drive home. Must have responsible person with you x 24 hours once discharged.  Name and phone number of your driver:   Special Instructions: CHG(Chlorhedine 4%-"Hibiclens","Betasept","Aplicare") Shower Use Special Wash: see special instructions.(avoid face and genitals)   Please read over the following fact sheets that you were given: MRSA Information, Blood Transfusion fact sheet, Incentive Spirometry Instruction.  Remember : Type/Screen "Blue armbands" - may not be removed once applied(would result in being retested if removed).  Failure to follow these instructions may result in Cancellation of your surgery.   Patient signature_______________________________________________________

## 2013-09-25 NOTE — Progress Notes (Signed)
09-25-13 1545 Labs viewable in Epic-note pt. Remains on warfarin will stop x7 days prior. Will recheck PT/INR AM of.

## 2013-09-25 NOTE — Pre-Procedure Instructions (Addendum)
09-25-13 EKG 7'14,Stress 6'14 , CXR 4'14- Epic 09-25-13 1545 Labs viewable in Epic-note to Dr. Deri Fuelling office, will repeat PT/INR AM of on arrival.

## 2013-09-26 IMAGING — CR DG CHEST 2V
2 series · 2 of 2 positions shown · non-contrast
Comparison: Chest x-ray 01/02/2013.

CLINICAL DATA: Cough and congestion.

CHEST - 2 VIEW

[view not recorded (1 of 2)]
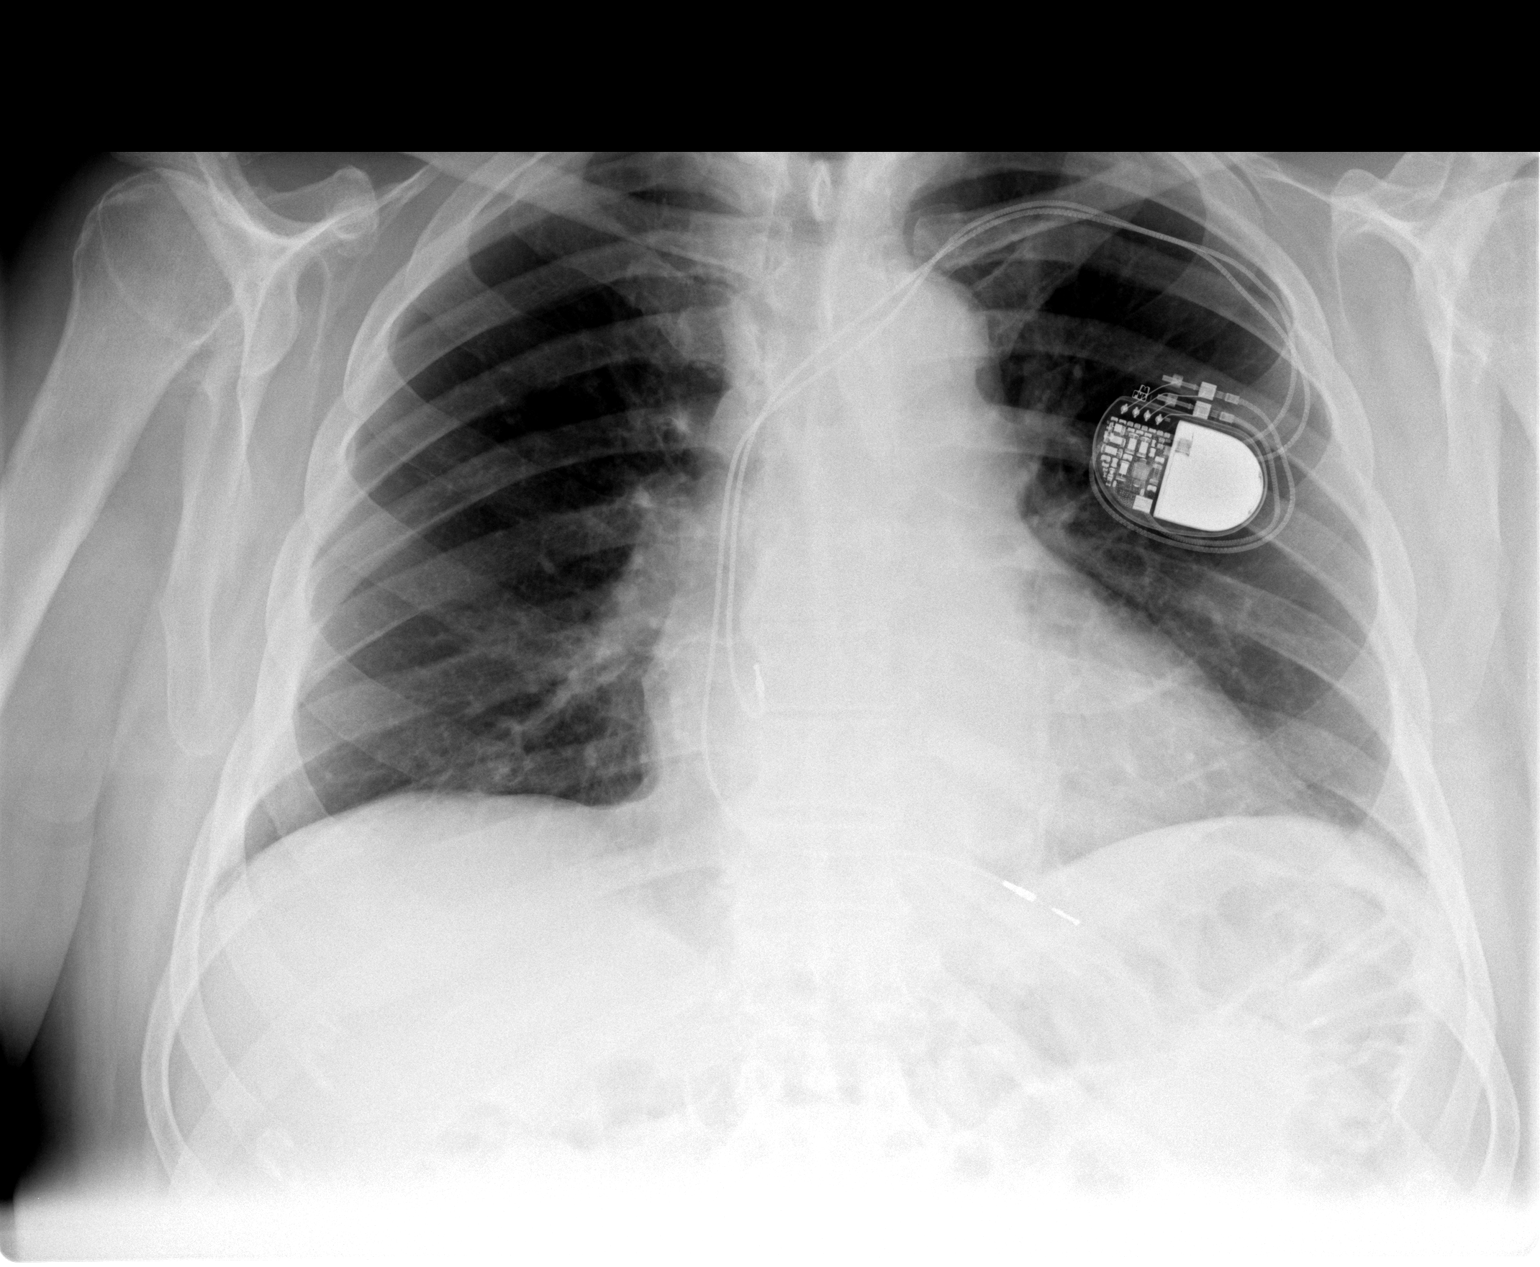

[view not recorded (2 of 2)]
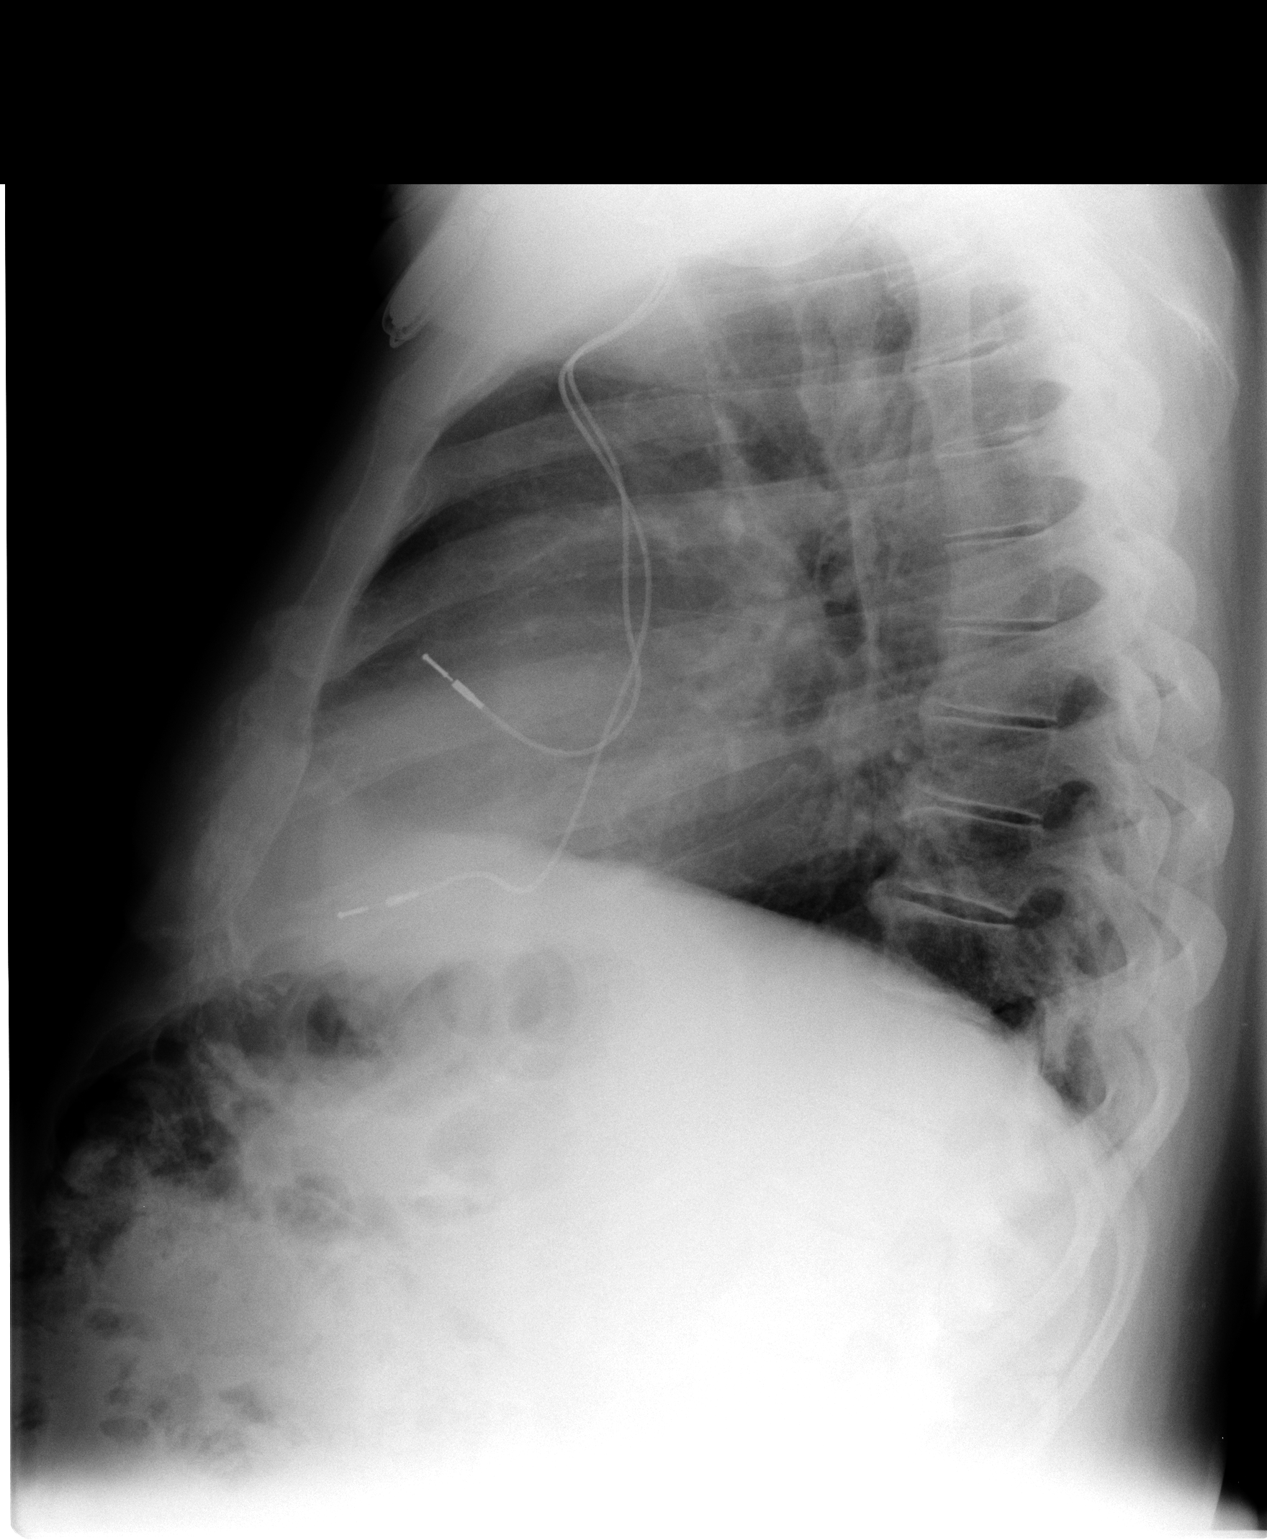

[2 of 2 positions shown; findings below may reference images not displayed]

FINDINGS: Lung volumes are normal.  No consolidative airspace
disease.  No pleural effusions.  No pneumothorax.  No pulmonary
nodule or mass noted.  Pulmonary vasculature and the
cardiomediastinal silhouette are within normal limits.  A left-
sided pacemaker device in place with lead tips projecting over the
expected location of the right atrial appendage and right
ventricular apex.
IMPRESSION: 1. No radiographic evidence of acute cardiopulmonary disease.

## 2013-09-30 ENCOUNTER — Ambulatory Visit: Payer: Medicare Other | Admitting: Pharmacist Clinician (PhC)/ Clinical Pharmacy Specialist

## 2013-10-04 ENCOUNTER — Other Ambulatory Visit: Payer: Self-pay | Admitting: Orthopedic Surgery

## 2013-10-04 NOTE — H&P (Signed)
[name]  DOB: 11/22/1933 Married / Language: English / Race: White Male  Date of Admission:  10/05/2013  Chief Complaint:  Right Knee Pain  History of Present Illness The patient is a 79 year old male who comes in for a preoperative History and Physical. The patient is scheduled for a right total knee arthroplasty to be performed by Dr. Frank V. Aluisio, MD at  Hospital on 10/05/2013. 07/27/2013. The patient is a 79 year old male who presents for follow up of their knee. The patient is being followed for their right knee pain and osteoarthritis. Symptoms reported today include: pain, stiffness, difficulty ambulating and difficulty arising from chair. The patient has reported improvement of their symptoms with: Cortisone injections (but it only helped for a short time). Unfortunately the knee is as bad now as it has ever been and continues to get worse. The cortisone helped for a very short amount of time. He has had recurrent effusion, recurrent pain. Both knees hurt but the right is worse than the left. He is ready to get the right knee fixed first. He was originally setup on 07/27/2013 but had to be cancelled and rescheduled due to personal issues but is now ready to proceed with surgery at this time. They have been treated conservatively in the past for the above stated problem and despite conservative measures, they continue to have progressive pain and severe functional limitations and dysfunction. They have failed non-operative management including home exercise, medications, and injections. It is felt that they would benefit from undergoing total joint replacement. Risks and benefits of the procedure have been discussed with the patient and they elect to proceed with surgery. There are no active contraindications to surgery such as ongoing infection or rapidly progressive neurological disease.   Problem List Primary osteoarthritis of both knees  (715.16)    Allergies No Known Drug Allergies    Family History Diabetes Mellitus. Mother, Brother. mother, brother and grandmother mothers side    Social History Drug/Alcohol Rehab (Currently). no Drug/Alcohol Rehab (Previously). no Alcohol use. never consumed alcohol Illicit drug use. no Living situation. live with spouse Children. 3 Marital status. married Number of flights of stairs before winded. 1 Post-Surgical Plans. Plan is to go home with his wife and family. Advance Directives. Living Will, Healthcare POA Tobacco use. Former smoker. former smoker    Medication History Potassium ( Oral) Specific dose unknown - Active. Warfarin Sodium (5MG Tablet, Oral) Active. Pantoprazole Sodium (40MG Tablet DR, Oral) Active. Metoprolol Succinate ER (50MG Tablet ER 24HR, Oral) Active. Allopurinol (100MG Tablet, Oral) Active.    Past Surgical History Pace Maker Atrial fibrillation status post cardioversion (427.31) Tonsillectomy    Medical History High blood pressure Prostate Cancer Atrial Fibrillation. Paroxsymal Impaired Vision. wears glasses Pacemaker Gout Diabetes Mellitus, Type II Gastroesophageal Reflux Disease    Review of Systems General:Not Present- Chills, Fever, Night Sweats, Fatigue, Weight Gain, Weight Loss and Memory Loss. Skin:Not Present- Hives, Itching, Rash, Eczema and Lesions. HEENT:Not Present- Tinnitus, Headache, Double Vision, Visual Loss, Hearing Loss and Dentures. Respiratory:Not Present- Shortness of breath with exertion, Shortness of breath at rest, Allergies, Coughing up blood and Chronic Cough. Cardiovascular:Not Present- Chest Pain, Racing/skipping heartbeats, Difficulty Breathing Lying Down, Murmur, Swelling and Palpitations. Gastrointestinal:Not Present- Bloody Stool, Heartburn, Abdominal Pain, Vomiting, Nausea, Constipation, Diarrhea, Difficulty Swallowing, Jaundice and Loss of appetitie. Male  Genitourinary:Not Present- Urinary frequency, Blood in Urine, Weak urinary stream, Discharge, Flank Pain, Incontinence, Painful Urination, Urgency, Urinary Retention and Urinating at Night.   Musculoskeletal:Present- Joint Swelling and Joint Pain. Not Present- Muscle Weakness, Muscle Pain, Back Pain, Morning Stiffness and Spasms. Neurological:Not Present- Tremor, Dizziness, Blackout spells, Paralysis, Difficulty with balance and Weakness. Psychiatric:Not Present- Insomnia.    Vitals Weight: 180 lb Height: 66 in Weight was reported by patient. Height was reported by patient. Body Surface Area: 1.95 m Body Mass Index: 29.05 kg/m Pulse: 72 (Regular) Resp.: 16 (Unlabored) BP: 146/80 (Sitting, Left Arm, Standard)     Physical Exam The physical exam findings are as follows:   General Mental Status - Alert, cooperative and good historian. General Appearance- pleasant. Not in acute distress. Orientation- Oriented X3. Build & Nutrition- Well nourished and Well developed.   Head and Neck Head- normocephalic, atraumatic . Neck Global Assessment- supple. no bruit auscultated on the right and no bruit auscultated on the left.   Eye Vision- Wears corrective lenses. Pupil- Bilateral- Regular and Round. Motion- Bilateral- EOMI.   Chest and Lung Exam Auscultation: Breath sounds:- clear at anterior chest wall and - clear at posterior chest wall. Adventitious sounds:- No Adventitious sounds.   Cardiovascular Auscultation:Rhythm- Regular rate and rhythm. Heart Sounds- S1 WNL and S2 WNL. Murmurs & Other Heart Sounds:Auscultation of the heart reveals - No Murmurs.   Abdomen Palpation/Percussion:Tenderness- Abdomen is non-tender to palpation. Rigidity (guarding)- Abdomen is soft. Auscultation:Auscultation of the abdomen reveals - Bowel sounds normal.   Male Genitourinary Not done, not pertinent to present illness  Musculoskeletal On  exam well developed male, alert and oriented in no apparent distress. Evaluation of his hips, normal range of motion, no discomfort. Left knee no effusion, moderate crepitus on range of motion in the knee, tender medial greater than lateral, no instability, range 5 to 125. Right knee large effusion, range 5 to 120, marked crepitus on range of motion, tender medial greater than lateral, no instability.  RADIOGRAPHS: AP and lateral both knees show advanced endstage arthritis, bone on bone medial, lateral and patellofemoral in both knees.   Assessment & Plan Primary osteoarthritis of both knees (715.16) Impression: Right Knee  Note: Plan is for a Right Total Knee Replacement by Dr. Aluisio.  Plan is to go home.  PCP - Dr. John Golding  The patient will not receive TXA (tranexamic acid) due to: Prostate Cancer  Signed electronically by Alexzandrew L Perkins, III PA-C  

## 2013-10-05 ENCOUNTER — Inpatient Hospital Stay (HOSPITAL_COMMUNITY): Payer: Medicare Other | Admitting: Certified Registered Nurse Anesthetist

## 2013-10-05 ENCOUNTER — Encounter (HOSPITAL_COMMUNITY): Payer: Self-pay | Admitting: *Deleted

## 2013-10-05 ENCOUNTER — Inpatient Hospital Stay (HOSPITAL_COMMUNITY)
Admission: RE | Admit: 2013-10-05 | Discharge: 2013-10-08 | DRG: 470 | Disposition: A | Discharge status: Home Health | Payer: Medicare Other | Source: Ambulatory Visit | Attending: Orthopedic Surgery | Admitting: Orthopedic Surgery

## 2013-10-05 ENCOUNTER — Encounter (HOSPITAL_COMMUNITY): Payer: Medicare Other | Admitting: Certified Registered Nurse Anesthetist

## 2013-10-05 ENCOUNTER — Encounter (HOSPITAL_COMMUNITY)
Admission: RE | Disposition: A | Discharge status: Home Health | Payer: Self-pay | Source: Ambulatory Visit | Attending: Orthopedic Surgery

## 2013-10-05 DIAGNOSIS — I4891 Unspecified atrial fibrillation: Secondary | ICD-10-CM | POA: Diagnosis not present

## 2013-10-05 DIAGNOSIS — Z8546 Personal history of malignant neoplasm of prostate: Secondary | ICD-10-CM

## 2013-10-05 DIAGNOSIS — K219 Gastro-esophageal reflux disease without esophagitis: Secondary | ICD-10-CM | POA: Diagnosis present

## 2013-10-05 DIAGNOSIS — M1710 Unilateral primary osteoarthritis, unspecified knee: Secondary | ICD-10-CM | POA: Diagnosis not present

## 2013-10-05 DIAGNOSIS — Z87891 Personal history of nicotine dependence: Secondary | ICD-10-CM | POA: Diagnosis not present

## 2013-10-05 DIAGNOSIS — M179 Osteoarthritis of knee, unspecified: Secondary | ICD-10-CM | POA: Diagnosis present

## 2013-10-05 DIAGNOSIS — K2270 Barrett's esophagus without dysplasia: Secondary | ICD-10-CM | POA: Diagnosis not present

## 2013-10-05 DIAGNOSIS — E119 Type 2 diabetes mellitus without complications: Secondary | ICD-10-CM | POA: Diagnosis not present

## 2013-10-05 DIAGNOSIS — Z833 Family history of diabetes mellitus: Secondary | ICD-10-CM | POA: Diagnosis not present

## 2013-10-05 DIAGNOSIS — Z950 Presence of cardiac pacemaker: Secondary | ICD-10-CM | POA: Diagnosis not present

## 2013-10-05 DIAGNOSIS — M109 Gout, unspecified: Secondary | ICD-10-CM | POA: Diagnosis present

## 2013-10-05 DIAGNOSIS — Z6831 Body mass index (BMI) 31.0-31.9, adult: Secondary | ICD-10-CM | POA: Diagnosis not present

## 2013-10-05 DIAGNOSIS — D62 Acute posthemorrhagic anemia: Secondary | ICD-10-CM | POA: Diagnosis not present

## 2013-10-05 DIAGNOSIS — Z96651 Presence of right artificial knee joint: Secondary | ICD-10-CM

## 2013-10-05 DIAGNOSIS — I2510 Atherosclerotic heart disease of native coronary artery without angina pectoris: Secondary | ICD-10-CM | POA: Diagnosis present

## 2013-10-05 DIAGNOSIS — Z87442 Personal history of urinary calculi: Secondary | ICD-10-CM

## 2013-10-05 DIAGNOSIS — I10 Essential (primary) hypertension: Secondary | ICD-10-CM | POA: Diagnosis present

## 2013-10-05 DIAGNOSIS — IMO0002 Reserved for concepts with insufficient information to code with codable children: Secondary | ICD-10-CM | POA: Diagnosis not present

## 2013-10-05 HISTORY — PX: TOTAL KNEE ARTHROPLASTY: SHX125

## 2013-10-05 LAB — GLUCOSE, CAPILLARY
Glucose-Capillary: 109 mg/dL — ABNORMAL HIGH (ref 70–99)
Glucose-Capillary: 91 mg/dL (ref 70–99)

## 2013-10-05 LAB — TYPE AND SCREEN
ABO/RH(D): O POS
Antibody Screen: NEGATIVE

## 2013-10-05 LAB — PROTIME-INR: Prothrombin Time: 14.1 seconds (ref 11.6–15.2)

## 2013-10-05 SURGERY — ARTHROPLASTY, KNEE, TOTAL
Anesthesia: General | Site: Knee | Laterality: Right | Wound class: Clean

## 2013-10-05 MED ORDER — ONDANSETRON HCL 4 MG/2ML IJ SOLN
INTRAMUSCULAR | Status: DC | PRN
  Administered 2013-10-05: 4 mg via INTRAVENOUS

## 2013-10-05 MED ORDER — NEOSTIGMINE METHYLSULFATE 1 MG/ML IJ SOLN
INTRAMUSCULAR | Status: DC | PRN
  Administered 2013-10-05: 4 mg via INTRAVENOUS

## 2013-10-05 MED ORDER — KETOROLAC TROMETHAMINE 15 MG/ML IJ SOLN: 7.5000 mg | Freq: Four times a day (QID) | INTRAMUSCULAR | Status: AC | PRN

## 2013-10-05 MED ORDER — ACETAMINOPHEN 650 MG RE SUPP: 650.0000 mg | Freq: Four times a day (QID) | RECTAL | Status: DC | PRN

## 2013-10-05 MED ORDER — HYDROMORPHONE HCL PF 1 MG/ML IJ SOLN
INTRAMUSCULAR | Status: AC
  Filled 2013-10-05: qty 1

## 2013-10-05 MED ORDER — MORPHINE SULFATE 2 MG/ML IJ SOLN: 1.0000 mg | INTRAMUSCULAR | Status: DC | PRN

## 2013-10-05 MED ORDER — LIDOCAINE HCL (CARDIAC) 20 MG/ML IV SOLN
INTRAVENOUS | Status: AC
  Filled 2013-10-05: qty 5

## 2013-10-05 MED ORDER — TRAMADOL HCL 50 MG PO TABS
50.0000 mg | ORAL_TABLET | Freq: Four times a day (QID) | ORAL | Status: DC | PRN
  Administered 2013-10-07 (×2): 50 mg via ORAL
  Filled 2013-10-05 (×2): qty 1

## 2013-10-05 MED ORDER — SUCCINYLCHOLINE CHLORIDE 20 MG/ML IJ SOLN
INTRAMUSCULAR | Status: DC | PRN
  Administered 2013-10-05: 100 mg via INTRAVENOUS

## 2013-10-05 MED ORDER — METHOCARBAMOL 500 MG PO TABS
500.0000 mg | ORAL_TABLET | Freq: Four times a day (QID) | ORAL | Status: DC | PRN
  Administered 2013-10-05 – 2013-10-07 (×6): 500 mg via ORAL
  Filled 2013-10-05 (×6): qty 1

## 2013-10-05 MED ORDER — SUCCINYLCHOLINE CHLORIDE 20 MG/ML IJ SOLN: INTRAMUSCULAR | Status: DC | PRN

## 2013-10-05 MED ORDER — LIDOCAINE HCL (CARDIAC) 20 MG/ML IV SOLN
INTRAVENOUS | Status: DC | PRN
  Administered 2013-10-05: 100 mg via INTRAVENOUS

## 2013-10-05 MED ORDER — ONDANSETRON HCL 4 MG/2ML IJ SOLN
INTRAMUSCULAR | Status: AC
  Filled 2013-10-05: qty 2

## 2013-10-05 MED ORDER — PROMETHAZINE HCL 25 MG/ML IJ SOLN: 6.2500 mg | INTRAMUSCULAR | Status: DC | PRN

## 2013-10-05 MED ORDER — SODIUM CHLORIDE 0.9 % IJ SOLN
INTRAMUSCULAR | Status: DC | PRN
  Administered 2013-10-05: 14:00:00

## 2013-10-05 MED ORDER — ACETAMINOPHEN 325 MG PO TABS: 650.0000 mg | ORAL_TABLET | Freq: Four times a day (QID) | ORAL | Status: DC | PRN

## 2013-10-05 MED ORDER — POLYETHYLENE GLYCOL 3350 17 G PO PACK
17.0000 g | PACK | Freq: Every day | ORAL | Status: DC | PRN
  Administered 2013-10-07: 20:00:00 17 g via ORAL

## 2013-10-05 MED ORDER — PANTOPRAZOLE SODIUM 40 MG PO TBEC
40.0000 mg | DELAYED_RELEASE_TABLET | Freq: Every day | ORAL | Status: DC
  Administered 2013-10-06 – 2013-10-08 (×3): 40 mg via ORAL
  Filled 2013-10-05 (×3): qty 1

## 2013-10-05 MED ORDER — GLYCOPYRROLATE 0.2 MG/ML IJ SOLN
INTRAMUSCULAR | Status: AC
  Filled 2013-10-05: qty 1

## 2013-10-05 MED ORDER — PROPOFOL 10 MG/ML IV BOLUS
INTRAVENOUS | Status: AC
  Filled 2013-10-05: qty 20

## 2013-10-05 MED ORDER — BUPIVACAINE HCL (PF) 0.25 % IJ SOLN
INTRAMUSCULAR | Status: AC
  Filled 2013-10-05: qty 30

## 2013-10-05 MED ORDER — ACETAMINOPHEN 500 MG PO TABS
1000.0000 mg | ORAL_TABLET | Freq: Four times a day (QID) | ORAL | Status: AC
  Administered 2013-10-05 – 2013-10-06 (×3): 1000 mg via ORAL
  Filled 2013-10-05 (×4): qty 2

## 2013-10-05 MED ORDER — FENTANYL CITRATE 0.05 MG/ML IJ SOLN
INTRAMUSCULAR | Status: DC | PRN
  Administered 2013-10-05: 50 ug via INTRAVENOUS
  Administered 2013-10-05: 100 ug via INTRAVENOUS
  Administered 2013-10-05 (×2): 50 ug via INTRAVENOUS

## 2013-10-05 MED ORDER — BUPIVACAINE HCL 0.25 % IJ SOLN
INTRAMUSCULAR | Status: DC | PRN
  Administered 2013-10-05: 20 mL

## 2013-10-05 MED ORDER — WARFARIN - PHARMACIST DOSING INPATIENT: Freq: Every day | Status: DC

## 2013-10-05 MED ORDER — HYDRALAZINE HCL 20 MG/ML IJ SOLN
5.0000 mg | INTRAMUSCULAR | Status: DC | PRN
  Administered 2013-10-05: 5 mg via INTRAVENOUS

## 2013-10-05 MED ORDER — NEOSTIGMINE METHYLSULFATE 1 MG/ML IJ SOLN
INTRAMUSCULAR | Status: AC
  Filled 2013-10-05: qty 10

## 2013-10-05 MED ORDER — DEXAMETHASONE 6 MG PO TABS
10.0000 mg | ORAL_TABLET | Freq: Every day | ORAL | Status: AC
  Administered 2013-10-06: 10 mg via ORAL
  Filled 2013-10-05: qty 1

## 2013-10-05 MED ORDER — BUPIVACAINE LIPOSOME 1.3 % IJ SUSP
20.0000 mL | Freq: Once | INTRAMUSCULAR | Status: DC
  Filled 2013-10-05: qty 20

## 2013-10-05 MED ORDER — SODIUM CHLORIDE 0.9 % IR SOLN
Status: DC | PRN
  Administered 2013-10-05: 1000 mL

## 2013-10-05 MED ORDER — METOPROLOL SUCCINATE ER 50 MG PO TB24
50.0000 mg | ORAL_TABLET | Freq: Every morning | ORAL | Status: DC
  Administered 2013-10-06 – 2013-10-08 (×3): 50 mg via ORAL
  Filled 2013-10-05 (×3): qty 1

## 2013-10-05 MED ORDER — DIPHENHYDRAMINE HCL 12.5 MG/5ML PO ELIX: 12.5000 mg | ORAL_SOLUTION | ORAL | Status: DC | PRN

## 2013-10-05 MED ORDER — ROCURONIUM BROMIDE 100 MG/10ML IV SOLN
INTRAVENOUS | Status: DC | PRN
  Administered 2013-10-05: 40 mg via INTRAVENOUS

## 2013-10-05 MED ORDER — ONDANSETRON HCL 4 MG/2ML IJ SOLN: 4.0000 mg | Freq: Four times a day (QID) | INTRAMUSCULAR | Status: DC | PRN

## 2013-10-05 MED ORDER — PROPOFOL 10 MG/ML IV BOLUS
INTRAVENOUS | Status: DC | PRN
  Administered 2013-10-05 (×2): 50 mg via INTRAVENOUS

## 2013-10-05 MED ORDER — GLYCOPYRROLATE 0.2 MG/ML IJ SOLN
INTRAMUSCULAR | Status: DC | PRN
  Administered 2013-10-05: .4 mg via INTRAVENOUS

## 2013-10-05 MED ORDER — MENTHOL 3 MG MT LOZG: 1.0000 | LOZENGE | OROMUCOSAL | Status: DC | PRN

## 2013-10-05 MED ORDER — 0.9 % SODIUM CHLORIDE (POUR BTL) OPTIME
TOPICAL | Status: DC | PRN
  Administered 2013-10-05: 200 mL

## 2013-10-05 MED ORDER — SODIUM CHLORIDE 0.9 % IJ SOLN
INTRAMUSCULAR | Status: AC
  Filled 2013-10-05: qty 50

## 2013-10-05 MED ORDER — DEXAMETHASONE SODIUM PHOSPHATE 10 MG/ML IJ SOLN
10.0000 mg | Freq: Every day | INTRAMUSCULAR | Status: AC
  Filled 2013-10-05: qty 1

## 2013-10-05 MED ORDER — METHOCARBAMOL 100 MG/ML IJ SOLN
500.0000 mg | Freq: Four times a day (QID) | INTRAVENOUS | Status: DC | PRN
  Administered 2013-10-05: 500 mg via INTRAVENOUS
  Filled 2013-10-05: qty 5

## 2013-10-05 MED ORDER — CHLORHEXIDINE GLUCONATE 4 % EX LIQD: 60.0000 mL | Freq: Once | CUTANEOUS | Status: DC

## 2013-10-05 MED ORDER — ROCURONIUM BROMIDE 100 MG/10ML IV SOLN
INTRAVENOUS | Status: AC
  Filled 2013-10-05: qty 1

## 2013-10-05 MED ORDER — METOCLOPRAMIDE HCL 10 MG PO TABS: 5.0000 mg | ORAL_TABLET | Freq: Three times a day (TID) | ORAL | Status: DC | PRN

## 2013-10-05 MED ORDER — MEPERIDINE HCL 50 MG/ML IJ SOLN: 6.2500 mg | INTRAMUSCULAR | Status: DC | PRN

## 2013-10-05 MED ORDER — ENOXAPARIN SODIUM 30 MG/0.3ML ~~LOC~~ SOLN
30.0000 mg | Freq: Two times a day (BID) | SUBCUTANEOUS | Status: DC
  Administered 2013-10-06 – 2013-10-08 (×5): 30 mg via SUBCUTANEOUS
  Filled 2013-10-05 (×7): qty 0.3

## 2013-10-05 MED ORDER — OXYCODONE HCL 5 MG/5ML PO SOLN
5.0000 mg | Freq: Once | ORAL | Status: DC | PRN
  Filled 2013-10-05: qty 5

## 2013-10-05 MED ORDER — CEFAZOLIN SODIUM-DEXTROSE 2-3 GM-% IV SOLR
INTRAVENOUS | Status: AC
  Filled 2013-10-05: qty 50

## 2013-10-05 MED ORDER — HYDRALAZINE HCL 20 MG/ML IJ SOLN
INTRAMUSCULAR | Status: AC
  Filled 2013-10-05: qty 1

## 2013-10-05 MED ORDER — SODIUM CHLORIDE 0.9 % IV SOLN: INTRAVENOUS | Status: DC

## 2013-10-05 MED ORDER — OXYCODONE HCL 5 MG PO TABS
5.0000 mg | ORAL_TABLET | ORAL | Status: DC | PRN
  Administered 2013-10-05 (×2): 5 mg via ORAL
  Administered 2013-10-06: 10 mg via ORAL
  Administered 2013-10-06: 05:00:00 5 mg via ORAL
  Administered 2013-10-06 – 2013-10-07 (×5): 10 mg via ORAL
  Administered 2013-10-07 – 2013-10-08 (×3): 5 mg via ORAL
  Filled 2013-10-05: qty 1
  Filled 2013-10-05: qty 2
  Filled 2013-10-05: qty 1
  Filled 2013-10-05 (×3): qty 2
  Filled 2013-10-05: qty 1
  Filled 2013-10-05: qty 2
  Filled 2013-10-05 (×2): qty 1
  Filled 2013-10-05: qty 2
  Filled 2013-10-05: qty 1

## 2013-10-05 MED ORDER — WARFARIN SODIUM 5 MG PO TABS
5.0000 mg | ORAL_TABLET | Freq: Once | ORAL | Status: AC
  Administered 2013-10-05: 20:00:00 5 mg via ORAL
  Filled 2013-10-05: qty 1

## 2013-10-05 MED ORDER — CEFAZOLIN SODIUM-DEXTROSE 2-3 GM-% IV SOLR
2.0000 g | Freq: Four times a day (QID) | INTRAVENOUS | Status: AC
  Administered 2013-10-05 – 2013-10-06 (×2): 2 g via INTRAVENOUS
  Filled 2013-10-05 (×2): qty 50

## 2013-10-05 MED ORDER — CEFAZOLIN SODIUM-DEXTROSE 2-3 GM-% IV SOLR
2.0000 g | INTRAVENOUS | Status: AC
  Administered 2013-10-05: 2 g via INTRAVENOUS

## 2013-10-05 MED ORDER — METOCLOPRAMIDE HCL 5 MG/ML IJ SOLN: 5.0000 mg | Freq: Three times a day (TID) | INTRAMUSCULAR | Status: DC | PRN

## 2013-10-05 MED ORDER — ALLOPURINOL 100 MG PO TABS
100.0000 mg | ORAL_TABLET | Freq: Every day | ORAL | Status: DC
  Administered 2013-10-05 – 2013-10-07 (×3): 100 mg via ORAL
  Filled 2013-10-05 (×4): qty 1

## 2013-10-05 MED ORDER — ACETAMINOPHEN 500 MG PO TABS
1000.0000 mg | ORAL_TABLET | Freq: Once | ORAL | Status: AC
  Administered 2013-10-05: 1000 mg via ORAL
  Filled 2013-10-05: qty 2

## 2013-10-05 MED ORDER — OXYCODONE HCL 5 MG PO TABS: 5.0000 mg | ORAL_TABLET | Freq: Once | ORAL | Status: DC | PRN

## 2013-10-05 MED ORDER — MIDAZOLAM HCL 5 MG/5ML IJ SOLN
INTRAMUSCULAR | Status: DC | PRN
  Administered 2013-10-05 (×2): 1 mg via INTRAVENOUS

## 2013-10-05 MED ORDER — ONDANSETRON HCL 4 MG PO TABS: 4.0000 mg | ORAL_TABLET | Freq: Four times a day (QID) | ORAL | Status: DC | PRN

## 2013-10-05 MED ORDER — MIDAZOLAM HCL 2 MG/2ML IJ SOLN
INTRAMUSCULAR | Status: AC
  Filled 2013-10-05: qty 2

## 2013-10-05 MED ORDER — HYDROMORPHONE HCL PF 1 MG/ML IJ SOLN
0.2500 mg | INTRAMUSCULAR | Status: DC | PRN
  Administered 2013-10-05 (×2): 0.5 mg via INTRAVENOUS

## 2013-10-05 MED ORDER — FLEET ENEMA 7-19 GM/118ML RE ENEM: 1.0000 | ENEMA | Freq: Once | RECTAL | Status: AC | PRN

## 2013-10-05 MED ORDER — FENTANYL CITRATE 0.05 MG/ML IJ SOLN
INTRAMUSCULAR | Status: AC
  Filled 2013-10-05: qty 5

## 2013-10-05 MED ORDER — SODIUM CHLORIDE 0.9 % IV SOLN
INTRAVENOUS | Status: DC
  Administered 2013-10-05: 20:00:00 via INTRAVENOUS

## 2013-10-05 MED ORDER — DEXAMETHASONE SODIUM PHOSPHATE 10 MG/ML IJ SOLN: 10.0000 mg | Freq: Once | INTRAMUSCULAR | Status: DC

## 2013-10-05 MED ORDER — LACTATED RINGERS IV SOLN
INTRAVENOUS | Status: DC
  Administered 2013-10-05: 14:00:00 via INTRAVENOUS
  Administered 2013-10-05: 1000 mL via INTRAVENOUS

## 2013-10-05 MED ORDER — BISACODYL 10 MG RE SUPP: 10.0000 mg | Freq: Every day | RECTAL | Status: DC | PRN

## 2013-10-05 MED ORDER — PHENOL 1.4 % MT LIQD: 1.0000 | OROMUCOSAL | Status: DC | PRN

## 2013-10-05 MED ORDER — DOCUSATE SODIUM 100 MG PO CAPS
100.0000 mg | ORAL_CAPSULE | Freq: Two times a day (BID) | ORAL | Status: DC
  Administered 2013-10-05 – 2013-10-08 (×6): 100 mg via ORAL

## 2013-10-05 SURGICAL SUPPLY — 56 items
BAG SPEC THK2 15X12 ZIP CLS (MISCELLANEOUS) ×1
BAG ZIPLOCK 12X15 (MISCELLANEOUS) ×2 IMPLANT
BANDAGE ELASTIC 6 VELCRO ST LF (GAUZE/BANDAGES/DRESSINGS) ×2 IMPLANT
BANDAGE ESMARK 6X9 LF (GAUZE/BANDAGES/DRESSINGS) ×1 IMPLANT
BLADE SAG 18X100X1.27 (BLADE) ×2 IMPLANT
BLADE SAW SGTL 11.0X1.19X90.0M (BLADE) ×2 IMPLANT
BNDG CMPR 9X6 STRL LF SNTH (GAUZE/BANDAGES/DRESSINGS) ×1
BNDG ESMARK 6X9 LF (GAUZE/BANDAGES/DRESSINGS) ×2
BOWL SMART MIX CTS (DISPOSABLE) ×2 IMPLANT
CAPT RP KNEE ×1 IMPLANT
CEMENT HV SMART SET (Cement) ×3 IMPLANT
CUFF TOURN SGL QUICK 34 (TOURNIQUET CUFF) ×2
CUFF TRNQT CYL 34X4X40X1 (TOURNIQUET CUFF) ×1 IMPLANT
DECANTER SPIKE VIAL GLASS SM (MISCELLANEOUS) ×2 IMPLANT
DRAPE EXTREMITY T 121X128X90 (DRAPE) ×2 IMPLANT
DRAPE POUCH INSTRU U-SHP 10X18 (DRAPES) ×2 IMPLANT
DRAPE U-SHAPE 47X51 STRL (DRAPES) ×2 IMPLANT
DRSG ADAPTIC 3X8 NADH LF (GAUZE/BANDAGES/DRESSINGS) ×2 IMPLANT
DRSG PAD ABDOMINAL 8X10 ST (GAUZE/BANDAGES/DRESSINGS) ×2 IMPLANT
DURAPREP 26ML APPLICATOR (WOUND CARE) ×2 IMPLANT
ELECT REM PT RETURN 9FT ADLT (ELECTROSURGICAL) ×2
ELECTRODE REM PT RTRN 9FT ADLT (ELECTROSURGICAL) ×1 IMPLANT
EVACUATOR 1/8 PVC DRAIN (DRAIN) ×2 IMPLANT
FACESHIELD LNG OPTICON STERILE (SAFETY) ×10 IMPLANT
GLOVE BIO SURGEON STRL SZ7.5 (GLOVE) IMPLANT
GLOVE BIO SURGEON STRL SZ8 (GLOVE) ×2 IMPLANT
GLOVE BIOGEL PI IND STRL 8 (GLOVE) ×2 IMPLANT
GLOVE BIOGEL PI INDICATOR 8 (GLOVE) ×2
GLOVE SURG SS PI 6.5 STRL IVOR (GLOVE) IMPLANT
GOWN PREVENTION PLUS LG XLONG (DISPOSABLE) ×2 IMPLANT
GOWN STRL REIN XL XLG (GOWN DISPOSABLE) IMPLANT
HANDPIECE INTERPULSE COAX TIP (DISPOSABLE) ×2
IMMOBILIZER KNEE 20 (SOFTGOODS) ×2
IMMOBILIZER KNEE 20 THIGH 36 (SOFTGOODS) ×1 IMPLANT
KIT BASIN OR (CUSTOM PROCEDURE TRAY) ×2 IMPLANT
MANIFOLD NEPTUNE II (INSTRUMENTS) ×2 IMPLANT
NDL SAFETY ECLIPSE 18X1.5 (NEEDLE) ×2 IMPLANT
NEEDLE HYPO 18GX1.5 SHARP (NEEDLE) ×4
NS IRRIG 1000ML POUR BTL (IV SOLUTION) ×2 IMPLANT
PACK TOTAL JOINT (CUSTOM PROCEDURE TRAY) ×2 IMPLANT
PADDING CAST COTTON 6X4 STRL (CAST SUPPLIES) ×4 IMPLANT
POSITIONER SURGICAL ARM (MISCELLANEOUS) ×2 IMPLANT
SET HNDPC FAN SPRY TIP SCT (DISPOSABLE) ×1 IMPLANT
SPONGE GAUZE 4X4 12PLY (GAUZE/BANDAGES/DRESSINGS) ×2 IMPLANT
STRIP CLOSURE SKIN 1/2X4 (GAUZE/BANDAGES/DRESSINGS) ×3 IMPLANT
SUCTION FRAZIER 12FR DISP (SUCTIONS) ×2 IMPLANT
SUT MNCRL AB 4-0 PS2 18 (SUTURE) ×2 IMPLANT
SUT VIC AB 2-0 CT1 27 (SUTURE) ×6
SUT VIC AB 2-0 CT1 TAPERPNT 27 (SUTURE) ×3 IMPLANT
SUT VLOC 180 0 24IN GS25 (SUTURE) ×2 IMPLANT
SYR 20CC LL (SYRINGE) ×2 IMPLANT
SYR 50ML LL SCALE MARK (SYRINGE) ×2 IMPLANT
TOWEL OR 17X26 10 PK STRL BLUE (TOWEL DISPOSABLE) ×4 IMPLANT
TRAY FOLEY CATH 14FRSI W/METER (CATHETERS) ×2 IMPLANT
WATER STERILE IRR 1500ML POUR (IV SOLUTION) ×2 IMPLANT
WRAP KNEE MAXI GEL POST OP (GAUZE/BANDAGES/DRESSINGS) ×2 IMPLANT

## 2013-10-05 NOTE — Interval H&P Note (Signed)
History and Physical Interval Note:  10/05/2013 9:40 AM  [name]  has presented today for surgery, with the diagnosis of RIGHT KNEE OA   The various methods of treatment have been discussed with the patient and family. After consideration of risks, benefits and other options for treatment, the patient has consented to  Procedure(s): RIGHT TOTAL KNEE ARTHROPLASTY (Right) as a surgical intervention .  The patient's history has been reviewed, patient examined, no change in status, stable for surgery.  I have reviewed the patient's chart and labs.  Questions were answered to the patient's satisfaction.     Loanne Drilling

## 2013-10-05 NOTE — Anesthesia Preprocedure Evaluation (Addendum)
Anesthesia Evaluation  Patient identified by MRN, date of birth, ID band Patient awake    Reviewed: Allergy & Precautions, H&P , NPO status , Patient's Chart, lab work & pertinent test results, reviewed documented beta blocker date and time   History of Anesthesia Complications Negative for: history of anesthetic complications  Airway Mallampati: II TM Distance: >3 FB Neck ROM: Full    Dental  (+) Dental Advisory Given   Pulmonary pneumonia -, resolved, former smoker,    Pulmonary exam normal       Cardiovascular hypertension, Pt. on medications and Pt. on home beta blockers + CAD, +CHF and DVT + dysrhythmias Atrial Fibrillation + pacemaker Rhythm:Regular Rate:Tachycardia  Bradycardia A.fib + Coumadin  Pacemaker DDD Medtronic >88% AP-VS, ~10% AS-VS, ~2% AP-VP  Echo 04/2013:  - Left ventricle: The cavity size was normal. Wall thickness  was normal. Systolic function was mildly to moderately  reduced. The estimated ejection fraction was in the range  of 40% to 45%. Diffuse hypokinesis. - Ventricular septum: Septal motion showed abnormal   function, dyssynergy, and paradox. - Aortic valve: Trivial regurgitation. - Mitral valve: Calcified annulus. - Left atrium: The atrium was mildly to moderately dilated. - Right ventricle: The cavity size was mildly dilated. - Atrial septum: No defect or patent foramen ovale was   identified. - Pulmonary arteries: Systolic pressure was mildly   increased. PA peak pressure: 39mm Hg (S).  MPS 05/2013 Exercise Capacity:  Lexiscan with no exercise. BP Response:  Normal blood pressure response. Clinical Symptoms:  No significant symptoms noted. ECG Impression:  No significant ST segment change suggestive of ischemia. Comparison with Prior Nuclear Study: No significant change from previous study  Overall Impression:  Normal stress nuclear study.  LV Wall Motion:  NL LV Function; NL Wall  Motion     Neuro/Psych negative neurological ROS  negative psych ROS   GI/Hepatic Neg liver ROS, GERD-  Medicated,  Endo/Other  diabetes, Well Controlled, Type 2  Renal/GU Renal diseaseHistory of Acute Renal Failure Creat = 1.14     Musculoskeletal  (+) Arthritis -, Osteoarthritis,    Abdominal Normal abdominal exam  (+)   Peds  Hematology negative hematology ROS (+) anemia ,   Anesthesia Other Findings   Reproductive/Obstetrics                         Anesthesia Physical  Anesthesia Plan  ASA: III  Anesthesia Plan: General   Post-op Pain Management:    Induction: Intravenous  Airway Management Planned: Oral ETT  Additional Equipment:   Intra-op Plan:   Post-operative Plan: Extubation in OR  Informed Consent: I have reviewed the patients History and Physical, chart, labs and discussed the procedure including the risks, benefits and alternatives for the proposed anesthesia with the patient or authorized representative who has indicated his/her understanding and acceptance.   Dental advisory given  Plan Discussed with: CRNA  Anesthesia Plan Comments:        Anesthesia Quick Evaluation

## 2013-10-05 NOTE — Op Note (Signed)
Pre-operative diagnosis- Osteoarthritis  Right knee(s)  Post-operative diagnosis- Osteoarthritis Right knee(s)  Procedure-  Right  Total Knee Arthroplasty  Surgeon- Gus Rankin. Ruey Storer, MD  Assistant- Avel Peace, PA-C   Anesthesia-  General EBL-* No blood loss amount entered *  Drains Hemovac  Tourniquet time- 30 minutes @ 300 mm Hg  Complications- None  Condition-PACU - hemodynamically stable.   Brief Clinical Note  [name] is a 79 y.o. year old male with end stage OA of his right knee with progressively worsening pain and dysfunction. He has constant pain, with activity and at rest and significant functional deficits with difficulties even with ADLs. He has had extensive non-op management including analgesics, injections of cortisone and viscosupplements, and home exercise program, but remains in significant pain with significant dysfunction. Radiographs show bone on bone arthritis medial and patellofemoral. He presents now for right Total Knee Arthroplasty.    Procedure in detail---   The patient is brought into the operating room and positioned supine on the operating table. After successful administration of  Spinal,   a tourniquet is placed high on the  Right thigh(s) and the lower extremity is prepped and draped in the usual sterile fashion. Time out is performed by the operating team and then the  Right lower extremity is wrapped in Esmarch, knee flexed and the tourniquet inflated to 300 mmHg.       A midline incision is made with a ten blade through the subcutaneous tissue to the level of the extensor mechanism. A fresh blade is used to make a medial parapatellar arthrotomy. Soft tissue over the proximal medial tibia is subperiosteally elevated to the joint line with a knife and into the semimembranosus bursa with a Cobb elevator. Soft tissue over the proximal lateral tibia is elevated with attention being paid to avoiding the patellar tendon on the tibial tubercle. The  patella is everted, knee flexed 90 degrees and the ACL and PCL are removed. Findings are bone on bone medial and patellofemoral with large global osteophytes.        The drill is used to create a starting hole in the distal femur and the canal is thoroughly irrigated with sterile saline to remove the fatty contents. The 5 degree Right  valgus alignment guide is placed into the femoral canal and the distal femoral cutting block is pinned to remove 10 mm off the distal femur. Resection is made with an oscillating saw.      The tibia is subluxed forward and the menisci are removed. The extramedullary alignment guide is placed referencing proximally at the medial aspect of the tibial tubercle and distally along the second metatarsal axis and tibial crest. The block is pinned to remove 2mm off the more deficient medial  side. Resection is made with an oscillating saw. Size 4is the most appropriate size for the tibia and the proximal tibia is prepared with the modular drill and keel punch for that size.      The femoral sizing guide is placed and size 4 is most appropriate. Rotation is marked off the epicondylar axis and confirmed by creating a rectangular flexion gap at 90 degrees. The size 4 cutting block is pinned in this rotation and the anterior, posterior and chamfer cuts are made with the oscillating saw. The intercondylar block is then placed and that cut is made.      Trial size 4 tibial component, trial size 4 posterior stabilized femur and a 10  mm posterior stabilized rotating platform  insert trial is placed. Full extension is achieved with excellent varus/valgus and anterior/posterior balance throughout full range of motion. The patella is everted and thickness measured to be 25  mm. Free hand resection is taken to 15 mm, a 38 template is placed, lug holes are drilled, trial patella is placed, and it tracks normally. Osteophytes are removed off the posterior femur with the trial in place. All trials are  removed and the cut bone surfaces prepared with pulsatile lavage. Cement is mixed and once ready for implantation, the size 4 tibial implant, size  4 posterior stabilized femoral component, and the size 38 patella are cemented in place and the patella is held with the clamp. The trial insert is placed and the knee held in full extension. The Exparel (20 ml mixed with 30 ml saline) and .25% Bupivicaine, are injected into the extensor mechanism, posterior capsule, medial and lateral gutters and subcutaneous tissues.  All extruded cement is removed and once the cement is hard the permanent 10 mm posterior stabilized rotating platform insert is placed into the tibial tray.      The wound is copiously irrigated with saline solution and the extensor mechanism closed over a hemovac drain with #1 PDS suture. The tourniquet is released for a total tourniquet time of 30  minutes. Flexion against gravity is 140 degrees and the patella tracks normally. Subcutaneous tissue is closed with 2.0 vicryl and subcuticular with running 4.0 Monocryl. The incision is cleaned and dried and steri-strips and a bulky sterile dressing are applied. The limb is placed into a knee immobilizer and the patient is awakened and transported to recovery in stable condition.      Please note that a surgical assistant was a medical necessity for this procedure in order to perform it in a safe and expeditious manner. Surgical assistant was necessary to retract the ligaments and vital neurovascular structures to prevent injury to them and also necessary for proper positioning of the limb to allow for anatomic placement of the prosthesis.   Gus Rankin Adisen Bennion, MD    10/05/2013, 2:03 PM

## 2013-10-05 NOTE — Progress Notes (Signed)
Utilization review completed.  

## 2013-10-05 NOTE — H&P (View-Only) (Signed)
[name]. [place]  DOB: Jul 31, 1934 Married / Language: English / Race: White Male  Date of Admission:  10/05/2013  Chief Complaint:  Right Knee Pain  History of Present Illness The patient is a 79 year old male who comes in for a preoperative History and Physical. The patient is scheduled for a right total knee arthroplasty to be performed by Dr. Gus Rankin. Aluisio, MD at [facility] on 10/05/2013. 07/27/2013. The patient is a 79 year old male who presents for follow up of their knee. The patient is being followed for their right knee pain and osteoarthritis. Symptoms reported today include: pain, stiffness, difficulty ambulating and difficulty arising from chair. The patient has reported improvement of their symptoms with: Cortisone injections (but it only helped for a short time). Unfortunately the knee is as bad now as it has ever been and continues to get worse. The cortisone helped for a very short amount of time. He has had recurrent effusion, recurrent pain. Both knees hurt but the right is worse than the left. He is ready to get the right knee fixed first. He was originally setup on 07/27/2013 but had to be cancelled and rescheduled due to personal issues but is now ready to proceed with surgery at this time. They have been treated conservatively in the past for the above stated problem and despite conservative measures, they continue to have progressive pain and severe functional limitations and dysfunction. They have failed non-operative management including home exercise, medications, and injections. It is felt that they would benefit from undergoing total joint replacement. Risks and benefits of the procedure have been discussed with the patient and they elect to proceed with surgery. There are no active contraindications to surgery such as ongoing infection or rapidly progressive neurological disease.   Problem List Primary osteoarthritis of both knees  (715.16)    Allergies No Known Drug Allergies    Family History Diabetes Mellitus. Mother, Brother. mother, brother and grandmother mothers side    Social History Drug/Alcohol Rehab (Currently). no Drug/Alcohol Rehab (Previously). no Alcohol use. never consumed alcohol Illicit drug use. no Living situation. live with spouse Children. 3 Marital status. married Number of flights of stairs before winded. 1 Post-Surgical Plans. Plan is to go home with his wife and family. Advance Directives. Living Will, Healthcare POA Tobacco use. Former smoker. former smoker    Medication History Potassium ( Oral) Specific dose unknown - Active. Warfarin Sodium (5MG  Tablet, Oral) Active. Pantoprazole Sodium (40MG  Tablet DR, Oral) Active. Metoprolol Succinate ER (50MG  Tablet ER 24HR, Oral) Active. Allopurinol (100MG  Tablet, Oral) Active.    Past Surgical History Pace Maker Atrial fibrillation status post cardioversion (427.31) Tonsillectomy    Medical History High blood pressure Prostate Cancer Atrial Fibrillation. Paroxsymal Impaired Vision. wears glasses Pacemaker Gout Diabetes Mellitus, Type II Gastroesophageal Reflux Disease    Review of Systems General:Not Present- Chills, Fever, Night Sweats, Fatigue, Weight Gain, Weight Loss and Memory Loss. Skin:Not Present- Hives, Itching, Rash, Eczema and Lesions. HEENT:Not Present- Tinnitus, Headache, Double Vision, Visual Loss, Hearing Loss and Dentures. Respiratory:Not Present- Shortness of breath with exertion, Shortness of breath at rest, Allergies, Coughing up blood and Chronic Cough. Cardiovascular:Not Present- Chest Pain, Racing/skipping heartbeats, Difficulty Breathing Lying Down, Murmur, Swelling and Palpitations. Gastrointestinal:Not Present- Bloody Stool, Heartburn, Abdominal Pain, Vomiting, Nausea, Constipation, Diarrhea, Difficulty Swallowing, Jaundice and Loss of appetitie. Male  Genitourinary:Not Present- Urinary frequency, Blood in Urine, Weak urinary stream, Discharge, Flank Pain, Incontinence, Painful Urination, Urgency, Urinary Retention and Urinating at Night.  Musculoskeletal:Present- Joint Swelling and Joint Pain. Not Present- Muscle Weakness, Muscle Pain, Back Pain, Morning Stiffness and Spasms. Neurological:Not Present- Tremor, Dizziness, Blackout spells, Paralysis, Difficulty with balance and Weakness. Psychiatric:Not Present- Insomnia.    Vitals Weight: 180 lb Height: 66 in Weight was reported by patient. Height was reported by patient. Body Surface Area: 1.95 m Body Mass Index: 29.05 kg/m Pulse: 72 (Regular) Resp.: 16 (Unlabored) BP: 146/80 (Sitting, Left Arm, Standard)     Physical Exam The physical exam findings are as follows:   General Mental Status - Alert, cooperative and good historian. General Appearance- pleasant. Not in acute distress. Orientation- Oriented X3. Build & Nutrition- Well nourished and Well developed.   Head and Neck Head- normocephalic, atraumatic . Neck Global Assessment- supple. no bruit auscultated on the right and no bruit auscultated on the left.   Eye Vision- Wears corrective lenses. Pupil- Bilateral- Regular and Round. Motion- Bilateral- EOMI.   Chest and Lung Exam Auscultation: Breath sounds:- clear at anterior chest wall and - clear at posterior chest wall. Adventitious sounds:- No Adventitious sounds.   Cardiovascular Auscultation:Rhythm- Regular rate and rhythm. Heart Sounds- S1 WNL and S2 WNL. Murmurs & Other Heart Sounds:Auscultation of the heart reveals - No Murmurs.   Abdomen Palpation/Percussion:Tenderness- Abdomen is non-tender to palpation. Rigidity (guarding)- Abdomen is soft. Auscultation:Auscultation of the abdomen reveals - Bowel sounds normal.   Male Genitourinary Not done, not pertinent to present illness  Musculoskeletal On  exam well developed male, alert and oriented in no apparent distress. Evaluation of his hips, normal range of motion, no discomfort. Left knee no effusion, moderate crepitus on range of motion in the knee, tender medial greater than lateral, no instability, range 5 to 125. Right knee large effusion, range 5 to 120, marked crepitus on range of motion, tender medial greater than lateral, no instability.  RADIOGRAPHS: AP and lateral both knees show advanced endstage arthritis, bone on bone medial, lateral and patellofemoral in both knees.   Assessment & Plan Primary osteoarthritis of both knees (715.16) Impression: Right Knee  Note: Plan is for a Right Total Knee Replacement by Dr. Lequita Halt.  Plan is to go home.  PCP - Dr. Assunta Found  The patient will not receive TXA (tranexamic acid) due to: Prostate Cancer  Signed electronically by Alexzandrew Tessie Fass, III PA-C

## 2013-10-05 NOTE — Transfer of Care (Signed)
Immediate Anesthesia Transfer of Care Note  Patient: [name]  Procedure(s) Performed: Procedure(s): RIGHT TOTAL KNEE ARTHROPLASTY (Right)  Patient Location: PACU  Anesthesia Type:General  Level of Consciousness: awake, alert  and oriented  Airway & Oxygen Therapy: Patient Spontanous Breathing and Patient connected to face mask oxygen  Post-op Assessment: Report given to PACU RN and Post -op Vital signs reviewed and stable  Post vital signs: Reviewed and stable  Complications: No apparent anesthesia complications

## 2013-10-05 NOTE — Anesthesia Postprocedure Evaluation (Signed)
Anesthesia Post Note  Patient: [name]  Procedure(s) Performed: Procedure(s) (LRB): RIGHT TOTAL KNEE ARTHROPLASTY (Right)  Anesthesia type: General  Patient location: PACU  Post pain: Pain level controlled  Post assessment: Post-op Vital signs reviewed  Last Vitals: BP 167/78  Pulse 60  Temp(Src) 36.8 C (Oral)  Resp 15  Ht 5\' 6"  (1.676 m)  Wt 192 lb (87.091 kg)  BMI 31.00 kg/m2  SpO2 100%  Post vital signs: Reviewed  Level of consciousness: sedated  Complications: No apparent anesthesia complications

## 2013-10-05 NOTE — Progress Notes (Signed)
ANTICOAGULATION CONSULT NOTE - Initial Consult  Pharmacy Consult for Coumadin Indication: atrial fibrillation  Allergies  Allergen Reactions  . No Known Allergies     Patient Measurements: Height: 5\' 6"  (167.6 cm) Weight: 192 lb (87.091 kg) IBW/kg (Calculated) : 63.8   Vital Signs: Temp: 98.2 F (36.8 C) (12/01 1600) Temp src: Oral (12/01 1600) BP: 167/78 mmHg (12/01 1600) Pulse Rate: 60 (12/01 1456)  Labs:  Recent Labs  10/05/13 1010  LABPROT 14.1  INR 1.11    Estimated Creatinine Clearance: 62.1 ml/min (by C-G formula based on Cr of 1.03).   Medical History: Past Medical History  Diagnosis Date  . Pacemaker     medtronic  . CAD (coronary artery disease)     echo 09-05-2010-EF nl, mod-severe dilated Left atrium; myoview 02-08-12-EF 52%, no ischemia  . HTN (hypertension)   . Barrett's esophagus   . Gout   . Arthritis   . History of cardioversion 02/09/13    electrical synchronized cardioversion, on flecainide and warfarin  . H/O transesophageal echocardiography (TEE) for monitoring 09/05/10    tee guided AT pace termination, did not proceed with cardioversion due to termination of the atrial flutter through his pacemaker  . Atrial fibrillation   . History of stomach ulcers     many yrs ago  . GERD (gastroesophageal reflux disease)   . History of kidney stones     per pt - no actual diagnosisi  . Cancer     prostate - radiation only  . Pacemaker 09-25-13  . Prostate cancer 09-25-13    tx. with radiation only- dx. 2013  . Diabetes mellitus without complication     "prediabetic"-no oral meds     Assessment: 39 yoM admitted 12/1 for R TKA. PMH significant for atrial fibrillation, sinus node dysfunction with a pacemaker, on chronic anticoagulation with Coumadin. His Coumadin is followed by [facility].   His Coumadin dose PTA was 2.5mg  daily except no Coumadin on Mon and Fri for a total weekly dose of 12.5mg   he has been stable  on this dosing regimen for several months with last 5 outpatient INRs within goal range of 2-3.   Patient was instructed to stop Coumadin 7 days prior to surgery and patient's last dose was on 11/23.   INR on admit 1.11  CBC ok pre-op, pt noted with baseline plts~150K  Patient also with enoxaparin 30mg  SQ BID until INR >/= 1.8   Goal of Therapy:  INR 2-3 Monitor platelets by anticoagulation protocol: Yes   Plan:  - Coumadin 5mg  PO x 1 tonight at 2000 - F/u CBC in AM - Daily PT/INR and CBC at least q72h - Monitor for bleeding  Thank you for the consult.  Tomi Bamberger, PharmD, BCPS Clinical Pharmacist Pager: [phone] Pharmacy: [phone] 10/05/2013 4:48 PM

## 2013-10-06 DIAGNOSIS — D62 Acute posthemorrhagic anemia: Secondary | ICD-10-CM | POA: Diagnosis not present

## 2013-10-06 LAB — BASIC METABOLIC PANEL
BUN: 20 mg/dL (ref 6–23)
CO2: 27 mEq/L (ref 19–32)
Chloride: 99 mEq/L (ref 96–112)
Sodium: 135 mEq/L (ref 135–145)

## 2013-10-06 LAB — PROTIME-INR
INR: 1.23 (ref 0.00–1.49)
Prothrombin Time: 15.2 seconds (ref 11.6–15.2)

## 2013-10-06 LAB — CBC
MCHC: 32.7 g/dL (ref 30.0–36.0)
Platelets: 157 10*3/uL (ref 150–400)
RBC: 3.62 MIL/uL — ABNORMAL LOW (ref 4.22–5.81)
WBC: 7.5 10*3/uL (ref 4.0–10.5)

## 2013-10-06 MED ORDER — FUROSEMIDE 10 MG/ML IJ SOLN
10.0000 mg | Freq: Once | INTRAMUSCULAR | Status: DC
  Filled 2013-10-06: qty 1

## 2013-10-06 MED ORDER — WARFARIN SODIUM 2.5 MG PO TABS
2.5000 mg | ORAL_TABLET | Freq: Once | ORAL | Status: AC
  Administered 2013-10-06: 2.5 mg via ORAL
  Filled 2013-10-06: qty 1

## 2013-10-06 NOTE — Care Management Note (Signed)
  Page 2 of 2   10/06/2013     1:31:46 PM   CARE MANAGEMENT NOTE 10/06/2013  Patient:  [name], [name]   Account Number:  [id number]  Date Initiated:  10/06/2013  Documentation initiated by:  Colleen Can  Subjective/Objective Assessment:   DX RT KNEE OSTEOARTHRITIS; TOTAL KNEE REPLACEMNT     Action/Plan:   CM spoke with patient. Plans are for him to return to his home in [place] where his spouse and son will be caregivers. He already has cane but will need RW.   Anticipated DC Date:  10/08/2013   Anticipated DC Plan:  HOME W HOME HEALTH SERVICES      DC Planning Services  CM consult      [facility] Choice  HOME HEALTH   Choice offered to / List presented to:  C-1 Patient        HH arranged  HH-1 RN  HH-2 PT      [facility] agency  [facility]   Status of service:  In process, will continue to follow Medicare Important Message given?   (If response is "NO", the following Medicare IM given date fields will be blank) Date Medicare IM given:   Date Additional Medicare IM given:    Discharge Disposition:    Per UR Regulation:    If discussed at Long Length of Stay Meetings, dates discussed:    Comments:  10/06/2013 Colleen Can BSN RN CCM [phone] [facility] rep requested to provide [facility] & HHPT services. Rep advised that they could provide services for coumadin managemnt and physical therapy upon pt's discharge.

## 2013-10-06 NOTE — Progress Notes (Signed)
Subjective: 1 Day Post-Op Procedure(s) (LRB): RIGHT TOTAL KNEE ARTHROPLASTY (Right) Patient reports pain as mild and moderate.   Patient seen in rounds with Dr. Lequita Halt. Patient is well, but has had some minor complaints of pain in the knee, requiring pain medications We will start therapy today. He was noted to have a low UOP since MN.  Will keep foley this morning and monitoring output. Plan is to go Home after hospital stay.  Objective: Vital signs in last 24 hours: Temp:  [97.6 F (36.4 C)-98.3 F (36.8 C)] 98.3 F (36.8 C) (12/02 0516) Pulse Rate:  [60-81] 60 (12/02 0516) Resp:  [12-18] 16 (12/02 0516) BP: (129-198)/(65-102) 170/74 mmHg (12/02 0516) SpO2:  [97 %-100 %] 99 % (12/02 0516) Weight:  [87.091 kg (192 lb)] 87.091 kg (192 lb) (12/01 1600)  Intake/Output from previous day:  Intake/Output Summary (Last 24 hours) at 10/06/13 0825 Last data filed at 10/06/13 0517  Gross per 24 hour  Intake 2901.66 ml  Output    859 ml  Net 2042.66 ml    Intake/Output this shift:    Labs:  Recent Labs  10/06/13 0501  HGB 10.7*    Recent Labs  10/06/13 0501  WBC 7.5  RBC 3.62*  HCT 32.7*  PLT 157    Recent Labs  10/06/13 0501  NA 135  K 4.2  CL 99  CO2 27  BUN 20  CREATININE 1.11  GLUCOSE 129*  CALCIUM 8.3*    Recent Labs  10/05/13 1010 10/06/13 0501  INR 1.11 1.23    EXAM General - Patient is Alert and Appropriate Extremity - Neurovascular intact Sensation intact distally Dorsiflexion/Plantar flexion intact Dressing - dressing C/D/I Motor Function - intact, moving foot and toes well on exam.  Hemovac pulled without difficulty.  Past Medical History  Diagnosis Date  . Pacemaker     medtronic  . CAD (coronary artery disease)     echo 09-05-2010-EF nl, mod-severe dilated Left atrium; myoview 02-08-12-EF 52%, no ischemia  . HTN (hypertension)   . Barrett's esophagus   . Gout   . Arthritis   . History of cardioversion 02/09/13    electrical  synchronized cardioversion, on flecainide and warfarin  . H/O transesophageal echocardiography (TEE) for monitoring 09/05/10    tee guided AT pace termination, did not proceed with cardioversion due to termination of the atrial flutter through his pacemaker  . Atrial fibrillation   . History of stomach ulcers     many yrs ago  . GERD (gastroesophageal reflux disease)   . History of kidney stones     per pt - no actual diagnosisi  . Cancer     prostate - radiation only  . Pacemaker 09-25-13  . Prostate cancer 09-25-13    tx. with radiation only- dx. 2013  . Diabetes mellitus without complication     "prediabetic"-no oral meds    Assessment/Plan: 1 Day Post-Op Procedure(s) (LRB): RIGHT TOTAL KNEE ARTHROPLASTY (Right) Principal Problem:   OA (osteoarthritis) of knee Active Problems:   HTN (hypertension)   Postoperative anemia due to acute blood loss  Estimated body mass index is 31 kg/(m^2) as calculated from the following:   Height as of this encounter: 5\' 6"  (1.676 m).   Weight as of this encounter: 87.091 kg (192 lb). Advance diet Up with therapy Continue foley due to strict I&O and urinary output monitoring Plan for discharge tomorrow Discharge home with home health Lasix one dose to diurese fluids.  DVT Prophylaxis - Lovenox  and Coumadin Weight-Bearing as tolerated to right leg D/C O2 and Pulse OX and try on Room Air  Lea Walbert 10/06/2013, 8:25 AM

## 2013-10-06 NOTE — Progress Notes (Signed)
Physical Therapy Treatment Patient Details Name: [name] MRN: [id number] DOB: 1934/06/18 Today's Date: 10/06/2013 Time: 4098-1191 PT Time Calculation (min): 12 min  PT Assessment / Plan / Recommendation  History of Present Illness RTKA   PT Comments   Pt may need extra day to achieve functional level for DC.   Follow Up Recommendations  Home health PT     Does the patient have the potential to tolerate intense rehabilitation     Barriers to Discharge        Equipment Recommendations  Rolling walker with 5" wheels    Recommendations for Other Services    Frequency 7X/week   Progress towards PT Goals Progress towards PT goals: Progressing toward goals  Plan Current plan remains appropriate    Precautions / Restrictions Precautions Precautions: Knee Required Braces or Orthoses: Knee Immobilizer - Right Knee Immobilizer - Right: Discontinue once straight leg raise with < 10 degree lag Restrictions Weight Bearing Restrictions: No   Pertinent Vitals/Pain 8 R knee after meds.    Mobility  Bed Mobility Bed Mobility: Supine to Sit Supine to Sit: 4: Min assist;HOB elevated;With rails Sit to Supine: 4: Min assist Details for Bed Mobility Assistance: assist for LLE Transfers Transfers: Sit to Stand;Stand to Sit Sit to Stand: 4: Min assist;3: Mod assist;From toilet;From chair/3-in-1;With armrests Sit to Stand: Patient Percentage: 60% Stand to Sit: 1: +2 Total assist Stand to Sit: Patient Percentage: 60% Details for Transfer Assistance: min from chair; mod from comfort height commode Ambulation/Gait Ambulation/Gait Assistance: 1: +2 Total assist Ambulation/Gait: Patient Percentage: 70% Ambulation Distance (Feet): 20 Feet Assistive device: Rolling walker Ambulation/Gait Assistance Details: frequent cues for sequence and position inside of  rolling walker, pt tends to take extra step with R leg before rolling walker forward. Gait Pattern: Step-to pattern;Antalgic     Exercises Total Joint Exercises Quad Sets: AROM;10 reps;Both;Supine Heel Slides: AAROM;Right;10 reps;Supine Hip ABduction/ADduction: AAROM;Right;10 reps;Supine Straight Leg Raises: AAROM;Right;10 reps;Supine   PT Diagnosis: Difficulty walking;Acute pain  PT Problem List: Decreased strength;Decreased range of motion;Decreased activity tolerance;Decreased mobility;Decreased knowledge of use of DME;Decreased safety awareness;Decreased knowledge of precautions;Pain PT Treatment Interventions: DME instruction;Gait training;Functional mobility training;Therapeutic activities;Therapeutic exercise;Stair training;Patient/family education   PT Goals (current goals can now be found in the care plan section) Acute Rehab PT Goals Patient Stated Goal: decreased pain PT Goal Formulation: With patient Time For Goal Achievement: 10/13/13 Potential to Achieve Goals: Good  Visit Information  Last PT Received On: 10/06/13 Assistance Needed: +1 History of Present Illness: RTKA    Subjective Data  Patient Stated Goal: decreased pain   Cognition  Cognition Arousal/Alertness: Awake/alert Behavior During Therapy: WFL for tasks assessed/performed Overall Cognitive Status: Within Functional Limits for tasks assessed    Balance     End of Session PT - End of Session Equipment Utilized During Treatment: Right knee immobilizer Activity Tolerance: Patient tolerated treatment well Patient left: in bed;with call bell/phone within reach Nurse Communication: Mobility status   GP     Rada Hay 10/06/2013, 2:25 PM Blanchard Kelch PT [phone]

## 2013-10-06 NOTE — Progress Notes (Signed)
ANTICOAGULATION CONSULT NOTE   Pharmacy Consult for Coumadin Indication: atrial fibrillation, VTE prophylaxis  Allergies  Allergen Reactions  . No Known Allergies     Patient Measurements: Height: 5\' 6"  (167.6 cm) Weight: 192 lb (87.091 kg) IBW/kg (Calculated) : 63.8   Vital Signs: Temp: 98.3 F (36.8 C) (12/02 0516) Temp src: Oral (12/02 0516) BP: 170/74 mmHg (12/02 0516) Pulse Rate: 60 (12/02 0516)  Labs:  Recent Labs  10/05/13 1010 10/06/13 0501  HGB  --  10.7*  HCT  --  32.7*  PLT  --  157  LABPROT 14.1 15.2  INR 1.11 1.23  CREATININE  --  1.11    Estimated Creatinine Clearance: 57.6 ml/min (by C-G formula based on Cr of 1.11).   Medical History: Past Medical History  Diagnosis Date  . Pacemaker     medtronic  . CAD (coronary artery disease)     echo 09-05-2010-EF nl, mod-severe dilated Left atrium; myoview 02-08-12-EF 52%, no ischemia  . HTN (hypertension)   . Barrett's esophagus   . Gout   . Arthritis   . History of cardioversion 02/09/13    electrical synchronized cardioversion, on flecainide and warfarin  . H/O transesophageal echocardiography (TEE) for monitoring 09/05/10    tee guided AT pace termination, did not proceed with cardioversion due to termination of the atrial flutter through his pacemaker  . Atrial fibrillation   . History of stomach ulcers     many yrs ago  . GERD (gastroesophageal reflux disease)   . History of kidney stones     per pt - no actual diagnosisi  . Cancer     prostate - radiation only  . Pacemaker 09-25-13  . Prostate cancer 09-25-13    tx. with radiation only- dx. 2013  . Diabetes mellitus without complication     "prediabetic"-no oral meds     Assessment: 65 yoM admitted 12/1 for R TKA. PMH significant for atrial fibrillation, sinus node dysfunction with a pacemaker, on chronic anticoagulation with Coumadin. His Coumadin is followed by [facility].   His Coumadin dose PTA was 2.5mg   daily except no Coumadin on Mon and Fri for a total weekly dose of 12.5mg   he has been stable on this dosing regimen for several months with last 5 outpatient INRs within goal range of 2-3.   Patient was instructed to stop Coumadin 7 days prior to surgery and patient's last dose was on 11/23.   INR on admit 1.11  Today's INR = 1.23  CBC: Hgb = 10.7 (decreased post-op, suspect ABLA), platelets stable  CBC pre-op = 13.2, pt noted with baseline plts~150K  Patient also with enoxaparin 30mg  SQ BID until INR >/= 1.8  Goal of Therapy:  INR 2-3 Monitor platelets by anticoagulation protocol: Yes   Plan:  - Coumadin 2.5mg  PO x 1 tonight at 2000 - Daily PT/INR and CBC at least q72h - Monitor for bleeding  Thank you for the consult.  Juliette Alcide, PharmD, BCPS.   Pager: [phone] 10/06/2013 8:11 AM

## 2013-10-06 NOTE — Evaluation (Signed)
Physical Therapy Evaluation Patient Details Name: [name] MRN: [id number] DOB: [birth year] Today's Date: 10/06/2013 Time: 9147-8295 PT Time Calculation (min): 24 min  PT Assessment / Plan / Recommendation History of Present Illness  RTKA  Clinical Impression  Pt ambulated x 20'/ Pt had difficulty tolerating ROM. Pt's R knee lacking 20 degrees extension. Encouraged pt to perform knee presses. Pt will benefit from PT to address problems listed. Pt plans DC to home with wife as caregiver.    PT Assessment  Patient needs continued PT services    Follow Up Recommendations  Home health PT    Does the patient have the potential to tolerate intense rehabilitation      Barriers to Discharge        Equipment Recommendations  Rolling walker with 5" wheels    Recommendations for Other Services     Frequency 7X/week    Precautions / Restrictions Precautions Precautions: Knee Required Braces or Orthoses: Knee Immobilizer - Right Knee Immobilizer - Right: Discontinue once straight leg raise with < 10 degree lag   Pertinent Vitals/Pain Reports pain is a 10, had medication, stated less pain when weight bearing.      Mobility  Bed Mobility Bed Mobility: Supine to Sit Supine to Sit: 4: Min assist;HOB elevated;With rails Details for Bed Mobility Assistance: support of R leg  and lower to the floor, cues for trunk position Transfers Transfers: Sit to Stand;Stand to Sit Sit to Stand: 1: +2 Total assist Sit to Stand: Patient Percentage: 60% Stand to Sit: 1: +2 Total assist Stand to Sit: Patient Percentage: 60% Ambulation/Gait Ambulation/Gait Assistance: 1: +2 Total assist Ambulation/Gait: Patient Percentage: 70% Ambulation Distance (Feet): 20 Feet Assistive device: Rolling walker Ambulation/Gait Assistance Details: frequent cues for sequence and position inside of  rolling walker, pt tends to take extra step with R leg before rolling walker forward. Gait Pattern: Step-to  pattern;Antalgic    Exercises Total Joint Exercises Quad Sets: AAROM;Both;10 reps;Seated   PT Diagnosis: Difficulty walking;Acute pain  PT Problem List: Decreased strength;Decreased range of motion;Decreased activity tolerance;Decreased mobility;Decreased knowledge of use of DME;Decreased safety awareness;Decreased knowledge of precautions;Pain PT Treatment Interventions: DME instruction;Gait training;Functional mobility training;Therapeutic activities;Therapeutic exercise;Stair training;Patient/family education     PT Goals(Current goals can be found in the care plan section) Acute Rehab PT Goals Patient Stated Goal: I want to get the other knee done PT Goal Formulation: With patient Time For Goal Achievement: 10/13/13 Potential to Achieve Goals: Good  Visit Information  Last PT Received On: 10/06/13 Assistance Needed: +2 History of Present Illness: RTKA       Prior Functioning  Home Living Family/patient expects to be discharged to:: Private residence Living Arrangements: Spouse/significant other Available Help at Discharge: Family Type of Home: House Home Access: Stairs to enter Secretary/administrator of Steps: 2 Entrance Stairs-Rails: Left Home Layout: One level Home Equipment: None Prior Function Level of Independence: Independent Communication Communication: No difficulties    Cognition  Cognition Arousal/Alertness: Awake/alert Overall Cognitive Status: Within Functional Limits for tasks assessed    Extremity/Trunk Assessment Upper Extremity Assessment Upper Extremity Assessment: Defer to OT evaluation Lower Extremity Assessment Lower Extremity Assessment: LLE deficits/detail LLE Deficits / Details: -20-35 knee , unable to perform SLR, difficulty getting knee further extended. Cervical / Trunk Assessment Cervical / Trunk Assessment: Normal   Balance    End of Session PT - End of Session Equipment Utilized During Treatment: Right knee immobilizer Activity  Tolerance: Patient limited by pain;Patient limited by fatigue Patient left: in  chair;with call bell/phone within reach Nurse Communication: Mobility status  GP     Rada Hay 10/06/2013, 12:41 PM Blanchard Kelch PT [phone]

## 2013-10-06 NOTE — Evaluation (Signed)
Occupational Therapy Evaluation Patient Details Name: [name] MRN: [id number] DOB: Aug 22, 1934 Today's Date: 10/06/2013 Time: 2952-8413 OT Time Calculation (min): 19 min  OT Assessment / Plan / Recommendation History of present illness RTKA   Clinical Impression   Pt was admitted for a R TKA.  He is limited by pain and fatique but was able to walk to bathroom at time of eval.  Will follow in acute with min A level goals related to adl mobility.  Wife will assist with adls as needed.      OT Assessment  Patient needs continued OT Services    Follow Up Recommendations  Home health OT    Barriers to Discharge      Equipment Recommendations  None recommended by OT    Recommendations for Other Services    Frequency  Min 2X/week    Precautions / Restrictions Precautions Precautions: Knee Required Braces or Orthoses: Knee Immobilizer - Right Knee Immobilizer - Right: Discontinue once straight leg raise with < 10 degree lag Restrictions Weight Bearing Restrictions: No   Pertinent Vitals/Pain RLE 4/10 at rest; 7-8 after ambulating to bathroom.  Repositioned and ice applied    ADL  Grooming: Set up Where Assessed - Grooming: Unsupported sitting Upper Body Bathing: Set up Where Assessed - Upper Body Bathing: Unsupported sitting Lower Body Bathing: Moderate assistance Where Assessed - Lower Body Bathing: Supported sit to stand Upper Body Dressing: Set up Where Assessed - Upper Body Dressing: Unsupported sitting Lower Body Dressing: Moderate assistance Where Assessed - Lower Body Dressing: Supported sit to stand Toilet Transfer: Minimal assistance;Moderate assistance (min on/ mod off) Toilet Transfer Method: Sit to stand Toilet Transfer Equipment: Comfort height toilet;Grab bars Toileting - Clothing Manipulation and Hygiene: Minimal assistance Where Assessed - Engineer, mining and Hygiene: Sit to stand from 3-in-1 or toilet Equipment Used: Rolling  walker Transfers/Ambulation Related to ADLs: pt doesn't put much weight on RLE--cues for sequence ADL Comments: ambulated to bathroom; pain increased with min weightbearing.  Pt states he has a 3:1 at home he can put on top of commode.  Wife will assist and son lives nearby    OT Diagnosis: Generalized weakness  OT Problem List: Decreased strength;Decreased activity tolerance;Decreased knowledge of use of DME or AE;Pain OT Treatment Interventions: Self-care/ADL training;DME and/or AE instruction;Patient/family education   OT Goals(Current goals can be found in the care plan section) Acute Rehab OT Goals Patient Stated Goal: decreased pain OT Goal Formulation: With patient Time For Goal Achievement: 10/13/13 Potential to Achieve Goals: Good ADL Goals Pt Will Transfer to Toilet: with min guard assist;bedside commode;ambulating Pt Will Perform Toileting - Clothing Manipulation and hygiene: with supervision;sit to/from stand Pt Will Perform Tub/Shower Transfer: with min assist;Shower transfer;ambulating  Visit Information  Last OT Received On: 10/06/13 Assistance Needed: +1 History of Present Illness: RTKA       Prior Functioning     Home Living Family/patient expects to be discharged to:: Private residence Living Arrangements: Spouse/significant other Available Help at Discharge: Family Type of Home: House Home Access: Stairs to enter Secretary/administrator of Steps: 2 Entrance Stairs-Rails: Left Home Layout: One level Home Equipment: Bedside commode Prior Function Level of Independence: Independent Communication Communication: No difficulties         Vision/Perception     Cognition  Cognition Arousal/Alertness: Awake/alert Behavior During Therapy: WFL for tasks assessed/performed Overall Cognitive Status: Within Functional Limits for tasks assessed    Extremity/Trunk Assessment Upper Extremity Assessment Upper Extremity Assessment: Overall WFL for tasks  assessed (krepitation, L shoulder; wfls)  Cervical / Trunk Assessment Cervical / Trunk Assessment: Normal     Mobility Bed Mobility   Sit to Supine: 4: Min assist Details for Bed Mobility Assistance: assist for LLE Transfers Sit to Stand: 4: Min assist;3: Mod assist;From toilet;From chair/3-in-1;With armrests Details for Transfer Assistance: min from chair; mod from comfort height commode     Exercise    Balance     End of Session OT - End of Session Activity Tolerance: Patient limited by fatigue;Patient limited by pain Patient left: in bed;with call bell/phone within reach  GO     Marios Gaiser 10/06/2013, 1:55 PM Marica Otter, OTR/L [phone] 10/06/2013

## 2013-10-07 LAB — BASIC METABOLIC PANEL
BUN: 24 mg/dL — ABNORMAL HIGH (ref 6–23)
Calcium: 8.3 mg/dL — ABNORMAL LOW (ref 8.4–10.5)
Chloride: 97 mEq/L (ref 96–112)
Creatinine, Ser: 1.15 mg/dL (ref 0.50–1.35)
GFR calc Af Amer: 69 mL/min — ABNORMAL LOW (ref >90)

## 2013-10-07 LAB — CBC
Hemoglobin: 9.4 g/dL — ABNORMAL LOW (ref 13.0–17.0)
Platelets: 161 10*3/uL (ref 150–400)
RBC: 3.14 MIL/uL — ABNORMAL LOW (ref 4.22–5.81)
RDW: 13.4 % (ref 11.5–15.5)

## 2013-10-07 MED ORDER — WARFARIN SODIUM 2.5 MG PO TABS
2.5000 mg | ORAL_TABLET | Freq: Once | ORAL | Status: AC
  Administered 2013-10-07: 2.5 mg via ORAL
  Filled 2013-10-07: qty 1

## 2013-10-07 MED ORDER — FUROSEMIDE 20 MG PO TABS
20.0000 mg | ORAL_TABLET | Freq: Once | ORAL | Status: AC
  Administered 2013-10-07: 18:00:00 20 mg via ORAL
  Filled 2013-10-07: qty 1

## 2013-10-07 NOTE — Progress Notes (Signed)
Physical Therapy Treatment Patient Details Name: [name] MRN: [id number] DOB: 1933-11-28 Today's Date: 10/07/2013 Time: 4098-1191 PT Time Calculation (min): 35 min  PT Assessment / Plan / Recommendation  History of Present Illness RTKA   PT Comments   Pt reports nausea while walking. Pt's wife and son present and report son will be available to assist at DC. Wife is limited. Plan for DC tomorrow after PT x 2.  Follow Up Recommendations  Home health PT     Does the patient have the potential to tolerate intense rehabilitation     Barriers to Discharge        Equipment Recommendations  Rolling walker with 5" wheels    Recommendations for Other Services    Frequency 7X/week   Progress towards PT Goals Progress towards PT goals: Progressing toward goals  Plan Current plan remains appropriate    Precautions / Restrictions Precautions Precautions: Knee;Fall Required Braces or Orthoses: Knee Immobilizer - Right Knee Immobilizer - Right: Discontinue once straight leg raise with < 10 degree lag   Pertinent Vitals/Pain 5 R knee.    Mobility  Bed Mobility Sit to Supine: 4: Min assist Details for Bed Mobility Assistance: assist for LLE Transfers Sit to Stand: 4: Min assist;From chair/3-in-1;3: Mod assist;From toilet;With upper extremity assist Stand to Sit: 4: Min assist;To bed;To toilet;With upper extremity assist;3: Mod assist Details for Transfer Assistance: min A for sit to stand:  cues for UE and LE placement.   Ambulation/Gait Ambulation/Gait Assistance: 4: Min assist Ambulation Distance (Feet): 40 Feet Assistive device: Rolling walker Ambulation/Gait Assistance Details: cues for safety w/ RW and sequence. Gait Pattern: Step-to pattern;Antalgic Gait velocity: decreased    Exercises Total Joint Exercises Quad Sets: AROM;10 reps;Both;Supine Short Arc Quad: AAROM;Right;10 reps;Supine Heel Slides: AAROM;Right;10 reps;Supine Hip ABduction/ADduction: AAROM;Right;10  reps;Supine Straight Leg Raises: AAROM;Right;10 reps;Supine   PT Diagnosis:    PT Problem List:   PT Treatment Interventions:     PT Goals (current goals can now be found in the care plan section)    Visit Information  Last PT Received On: 10/07/13 Assistance Needed: +1 History of Present Illness: RTKA    Subjective Data      Cognition  Cognition Arousal/Alertness: Awake/alert    Balance     End of Session PT - End of Session Equipment Utilized During Treatment: Right knee immobilizer Activity Tolerance: Patient limited by fatigue (nausea) Patient left: with call bell/phone within reach;in chair Nurse Communication: Mobility status   GP     Rada Hay 10/07/2013, 4:36 PM

## 2013-10-07 NOTE — Progress Notes (Signed)
Occupational Therapy Treatment Patient Details Name: [name] MRN: [id number] DOB: August 14, 1934 Today's Date: 10/07/2013 Time: 4098-1191 OT Time Calculation (min): 20 min  OT Assessment / Plan / Recommendation  History of present illness RTKA   OT comments  Pt continues to be limited by decreased endurance and dyspnea.  Moving overall with min A and max cues for sequencing  Follow Up Recommendations  SNF (would benefit due to decreased endurance; if not HHOT)    Barriers to Discharge       Equipment Recommendations  None recommended by OT (has 3:1 commode)    Recommendations for Other Services    Frequency Min 2X/week   Progress towards OT Goals Progress towards OT goals: Progressing toward goals  Plan Frequency remains appropriate    Precautions / Restrictions Precautions Precautions: Knee Required Braces or Orthoses: Knee Immobilizer - Right Knee Immobilizer - Right: Discontinue once straight leg raise with < 10 degree lag Restrictions Weight Bearing Restrictions: No   Pertinent Vitals/Pain RLE present but not rated. Repositioned with ice    ADL  Toilet Transfer: Minimal assistance Toilet Transfer Method: Sit to stand Toilet Transfer Equipment: Comfort height toilet;Grab bars Equipment Used: Rolling walker Transfers/Ambulation Related to ADLs: pt needed max sequencing cues for ambulating to bathroom; putting a little more weight on RLE ADL Comments: pt fatiques easily:  wanted back to bed.  Dyspnea 2/4 both getting to toilet and getting back to bed    OT Diagnosis:    OT Problem List:   OT Treatment Interventions:     OT Goals(current goals can now be found in the care plan section)    Visit Information  Last OT Received On: 10/07/13 Assistance Needed: +1 History of Present Illness: RTKA    Subjective Data      Prior Functioning       Cognition  Cognition Arousal/Alertness: Awake/alert Behavior During Therapy: WFL for tasks  assessed/performed Overall Cognitive Status: Within Functional Limits for tasks assessed    Mobility  Bed Mobility Supine to Sit: 4: Min assist;HOB elevated;With rails Sit to Supine: 4: Min assist Details for Bed Mobility Assistance: assist for LLE Transfers Sit to Stand: 4: Min assist;From bed;From toilet;With upper extremity assist Details for Transfer Assistance: min A for sit to stand:  cues for UE and LE placement.  Pt needed physical assist for RLE as he didn't extend even with cues on last transfer back to bed    Exercises      Balance     End of Session OT - End of Session Activity Tolerance: Patient limited by fatigue Patient left: in bed;with call bell/phone within reach CPM Right Knee CPM Right Knee: Off  GO     Priya Matsen 10/07/2013, 11:37 AM Marica Otter, OTR/L [phone] 10/07/2013

## 2013-10-07 NOTE — Progress Notes (Signed)
Physical Therapy Treatment Patient Details Name: [name] MRN: [id number] DOB: 1934/10/15 Today's Date: 10/07/2013 Time: 4098-1191 PT Time Calculation (min): 20 min  PT Assessment / Plan / Recommendation  History of Present Illness RTKA   PT Comments   Pt continues to progress slowly. No family has been present to observe pt during therapy. Pt declines need for going to rehab but pt is requiring close assist for safe mobility.   Follow Up Recommendations        Does the patient have the potential to tolerate intense rehabilitation     Barriers to Discharge        Equipment Recommendations  Rolling walker with 5" wheels    Recommendations for Other Services    Frequency 7X/week   Progress towards PT Goals Progress towards PT goals: Progressing toward goals  Plan Current plan remains appropriate    Precautions / Restrictions Precautions Precautions: Knee;Fall Required Braces or Orthoses: Knee Immobilizer - Right Knee Immobilizer - Right: Discontinue once straight leg raise with < 10 degree lag Restrictions Weight Bearing Restrictions: No   Pertinent Vitals/Pain 7 /10 with weight bear.    Mobility  Bed Mobility Supine to Sit: 4: Min assist;HOB elevated;With rails Sit to Supine: 4: Min assist Details for Bed Mobility Assistance: assist for LLE Transfers Sit to Stand: 4: Min assist;From toilet;With upper extremity assist Stand to Sit: 4: Min assist Details for Transfer Assistance: min A for sit to stand:  cues for UE and LE placement.   Ambulation/Gait Ambulation/Gait Assistance: 1: +2 Total assist Ambulation Distance (Feet): 50 Feet Assistive device: Rolling walker Ambulation/Gait Assistance Details: frequent cues for sequence and position inside of RW. Gait Pattern: Step-to pattern;Antalgic Gait velocity: decreased    Exercises     PT Diagnosis:    PT Problem List:   PT Treatment Interventions:     PT Goals (current goals can now be found in the care  plan section)    Visit Information  Last PT Received On: 10/07/13 Assistance Needed: +1 History of Present Illness: RTKA    Subjective Data      Cognition  Cognition Arousal/Alertness: Awake/alert Behavior During Therapy: WFL for tasks assessed/performed Overall Cognitive Status: Within Functional Limits for tasks assessed    Balance     End of Session PT - End of Session Equipment Utilized During Treatment: Right knee immobilizer Activity Tolerance: Patient tolerated treatment well Patient left: with call bell/phone within reach;in chair Nurse Communication: Mobility status   GP     Rada Hay 10/07/2013, 2:05 PM

## 2013-10-07 NOTE — Progress Notes (Signed)
   Subjective: 2 Days Post-Op Procedure(s) (LRB): RIGHT TOTAL KNEE ARTHROPLASTY (Right) Patient reports pain as mild and moderate.   Patient seen in rounds with Dr. Lequita Halt. Patient is well, but has had some minor complaints of pain in the knee, requiring pain medications Plan is to go Home after hospital stay.  Objective: Vital signs in last 24 hours: Temp:  [98.4 F (36.9 C)-99.1 F (37.3 C)] 98.4 F (36.9 C) (12/03 0534) Pulse Rate:  [71-76] 76 (12/03 1021) Resp:  [16] 16 (12/03 0534) BP: (153-164)/(66-77) 157/77 mmHg (12/03 1021) SpO2:  [93 %-94 %] 93 % (12/03 0534)  Intake/Output from previous day:  Intake/Output Summary (Last 24 hours) at 10/07/13 1037 Last data filed at 10/07/13 0820  Gross per 24 hour  Intake    600 ml  Output    650 ml  Net    -50 ml    Intake/Output this shift: Total I/O In: 120 [P.O.:120] Out: 250 [Urine:250]  Labs:  Recent Labs  10/06/13 0501 10/07/13 0452  HGB 10.7* 9.4*    Recent Labs  10/06/13 0501 10/07/13 0452  WBC 7.5 10.2  RBC 3.62* 3.14*  HCT 32.7* 28.0*  PLT 157 161    Recent Labs  10/06/13 0501 10/07/13 0452  NA 135 130*  K 4.2 3.7  CL 99 97  CO2 27 24  BUN 20 24*  CREATININE 1.11 1.15  GLUCOSE 129* 161*  CALCIUM 8.3* 8.3*    Recent Labs  10/06/13 0501 10/07/13 0452  INR 1.23 1.57*    EXAM General - Patient is Alert, Appropriate and Oriented Extremity - Neurovascular intact Sensation intact distally Dorsiflexion/Plantar flexion intact Dressing/Incision - clean, dry, no drainage Motor Function - intact, moving foot and toes well on exam.   Past Medical History  Diagnosis Date  . Pacemaker     medtronic  . CAD (coronary artery disease)     echo 09-05-2010-EF nl, mod-severe dilated Left atrium; myoview 02-08-12-EF 52%, no ischemia  . HTN (hypertension)   . Barrett's esophagus   . Gout   . Arthritis   . History of cardioversion 02/09/13    electrical synchronized cardioversion, on flecainide  and warfarin  . H/O transesophageal echocardiography (TEE) for monitoring 09/05/10    tee guided AT pace termination, did not proceed with cardioversion due to termination of the atrial flutter through his pacemaker  . Atrial fibrillation   . History of stomach ulcers     many yrs ago  . GERD (gastroesophageal reflux disease)   . History of kidney stones     per pt - no actual diagnosisi  . Cancer     prostate - radiation only  . Pacemaker 09-25-13  . Prostate cancer 09-25-13    tx. with radiation only- dx. 2013  . Diabetes mellitus without complication     "prediabetic"-no oral meds    Assessment/Plan: 2 Days Post-Op Procedure(s) (LRB): RIGHT TOTAL KNEE ARTHROPLASTY (Right) Principal Problem:   OA (osteoarthritis) of knee Active Problems:   HTN (hypertension)   Postoperative anemia due to acute blood loss  Estimated body mass index is 31 kg/(m^2) as calculated from the following:   Height as of this encounter: 5\' 6"  (1.676 m).   Weight as of this encounter: 87.091 kg (192 lb). Up with therapy Plan for discharge tomorrow Discharge home with home health UOP stable  DVT Prophylaxis - Lovenox and Coumadin Weight-Bearing as tolerated to right leg  Sahra Converse 10/07/2013, 10:37 AM

## 2013-10-07 NOTE — Progress Notes (Signed)
ANTICOAGULATION CONSULT NOTE   Pharmacy Consult for Coumadin Indication: atrial fibrillation, VTE prophylaxis  Allergies  Allergen Reactions  . No Known Allergies     Patient Measurements: Height: 5\' 6"  (167.6 cm) Weight: 192 lb (87.091 kg) IBW/kg (Calculated) : 63.8   Vital Signs: Temp: 98.4 F (36.9 C) (12/03 0534) Temp src: Oral (12/03 0534) BP: 163/66 mmHg (12/03 0534) Pulse Rate: 71 (12/03 0534)  Labs:  Recent Labs  10/05/13 1010 10/06/13 0501 10/07/13 0452  HGB  --  10.7* 9.4*  HCT  --  32.7* 28.0*  PLT  --  157 161  LABPROT 14.1 15.2 18.3*  INR 1.11 1.23 1.57*  CREATININE  --  1.11 1.15    Estimated Creatinine Clearance: 55.6 ml/min (by C-G formula based on Cr of 1.15).   Medical History: Past Medical History  Diagnosis Date  . Pacemaker     medtronic  . CAD (coronary artery disease)     echo 09-05-2010-EF nl, mod-severe dilated Left atrium; myoview 02-08-12-EF 52%, no ischemia  . HTN (hypertension)   . Barrett's esophagus   . Gout   . Arthritis   . History of cardioversion 02/09/13    electrical synchronized cardioversion, on flecainide and warfarin  . H/O transesophageal echocardiography (TEE) for monitoring 09/05/10    tee guided AT pace termination, did not proceed with cardioversion due to termination of the atrial flutter through his pacemaker  . Atrial fibrillation   . History of stomach ulcers     many yrs ago  . GERD (gastroesophageal reflux disease)   . History of kidney stones     per pt - no actual diagnosisi  . Cancer     prostate - radiation only  . Pacemaker 09-25-13  . Prostate cancer 09-25-13    tx. with radiation only- dx. 2013  . Diabetes mellitus without complication     "prediabetic"-no oral meds     Assessment: 61 yoM admitted 12/1 for R TKA. PMH significant for atrial fibrillation, sinus node dysfunction with a pacemaker, on chronic anticoagulation with Coumadin. [facility] Cou[facility]in is [facility]   His Coumadin dose PTA was 2.5mg  daily except no Coumadin on Mon and Fri for a total weekly dose of 12.5mg   he has been stable on this dosing regimen for several months with last 5 outpatient INRs within goal range of 2-3.   Patient was instructed to stop Coumadin 7 days prior to surgery and patient's last dose was on 11/23.   INR on admit 1.11  Today's INR = 1.57 (trending up)  CBC: Hgb = 9.4 (decreased post-op, suspect ABLA), platelets stable  CBC pre-op = 13.2, pt noted with baseline plts~150K  Patient also with enoxaparin 30mg  SQ BID until INR >/= 1.8  Goal of Therapy:  INR 2-3 Monitor platelets by anticoagulation protocol: Yes   Plan:  - Coumadin 2.5mg  PO x 1 tonight at 2000  - Continue home dosing for now  - Daily PT/INR and CBC at least q72h - Monitor for bleeding  Thank you for the consult.  Juliette Alcide, PharmD, BCPS.   Pager: [phone] 10/07/2013 8:11 AM

## 2013-10-08 LAB — CBC
Hemoglobin: 9.4 g/dL — ABNORMAL LOW (ref 13.0–17.0)
MCH: 30.1 pg (ref 26.0–34.0)
MCHC: 34.1 g/dL (ref 30.0–36.0)
MCV: 88.5 fL (ref 78.0–100.0)
Platelets: 177 10*3/uL (ref 150–400)
RBC: 3.12 MIL/uL — ABNORMAL LOW (ref 4.22–5.81)
RDW: 13.2 % (ref 11.5–15.5)
WBC: 9 10*3/uL (ref 4.0–10.5)

## 2013-10-08 LAB — PROTIME-INR
INR: 1.75 — ABNORMAL HIGH (ref 0.00–1.49)
Prothrombin Time: 19.9 seconds — ABNORMAL HIGH (ref 11.6–15.2)

## 2013-10-08 MED ORDER — WARFARIN SODIUM 2.5 MG PO TABS
2.5000 mg | ORAL_TABLET | Freq: Once | ORAL | Status: DC
Start: 2013-10-08 — End: 2013-10-08
  Filled 2013-10-08: qty 1

## 2013-10-08 MED ORDER — TRAMADOL HCL 50 MG PO TABS: 50.0000 mg | ORAL_TABLET | Freq: Four times a day (QID) | ORAL | Status: DC | PRN

## 2013-10-08 MED ORDER — ENOXAPARIN (LOVENOX) PATIENT EDUCATION KIT
PACK | Freq: Once | Status: DC
  Filled 2013-10-08: qty 1

## 2013-10-08 MED ORDER — METHOCARBAMOL 500 MG PO TABS: 500.0000 mg | ORAL_TABLET | Freq: Four times a day (QID) | ORAL | Status: DC | PRN

## 2013-10-08 MED ORDER — ENOXAPARIN SODIUM 30 MG/0.3ML ~~LOC~~ SOLN: 30.0000 mg | Freq: Two times a day (BID) | SUBCUTANEOUS | Status: DC

## 2013-10-08 MED ORDER — OXYCODONE HCL 5 MG PO TABS: 5.0000 mg | ORAL_TABLET | ORAL | Status: DC | PRN

## 2013-10-08 MED ORDER — WARFARIN SODIUM 2.5 MG PO TABS: 2.5000 mg | ORAL_TABLET | Freq: Every day | ORAL | Status: DC

## 2013-10-08 NOTE — Progress Notes (Signed)
[facility]  [facility] is providing the following services: RW   If patient discharges after hours, please call [phone].   [name] 10/08/2013, 12:59 PM

## 2013-10-08 NOTE — Progress Notes (Signed)
Pt., wife, and son educated on lovenox injections and given a print out to take home with step by step instructions on how to inject lovenox.

## 2013-10-08 NOTE — Progress Notes (Signed)
Pt stable, scripts, d/c instructions given with no questions/concerns voiced by pt/wife/son.  Pt transported via wheelchair to private vehicle by NT and family.

## 2013-10-08 NOTE — Progress Notes (Signed)
Physical Therapy Treatment Patient Details Name: [name] MRN: [id number] DOB: 1934/09/24 Today's Date: 10/08/2013 Time: 1030-1105 PT Time Calculation (min): 35 min  PT Assessment / Plan / Recommendation  History of Present Illness RTKA   PT Comments   Much improvement in mobility and ambulation. For DC today.  Follow Up Recommendations  Home health PT     Does the patient have the potential to tolerate intense rehabilitation     Barriers to Discharge        Equipment Recommendations       Recommendations for Other Services    Frequency     Progress towards PT Goals Progress towards PT goals: Progressing toward goals  Plan Current plan remains appropriate    Precautions / Restrictions Precautions Precautions: Knee;Fall Required Braces or Orthoses: Knee Immobilizer - Right   Pertinent Vitals/Pain 5 R knee    Mobility  Bed Mobility Supine to Sit: 5: Supervision Sit to Supine: 4: Min assist Details for Bed Mobility Assistance: assist for RLE Transfers Sit to Stand: 5: Supervision;From bed;From chair/3-in-1;From elevated surface Stand to Sit: To chair/3-in-1;To bed;With upper extremity assist;5: Supervision Ambulation/Gait Ambulation/Gait Assistance: 4: Min guard Ambulation Distance (Feet): 50 Feet Assistive device: Rolling walker Ambulation/Gait Assistance Details: cues for sequence. Gait Pattern: Step-to pattern;Antalgic;Step-through pattern Gait velocity: decreased Stairs: Yes Stairs Assistance: 4: Min assist Stair Management Technique: One rail Left;Forwards Number of Stairs: 2    Exercises Total Joint Exercises Ankle Circles/Pumps: AROM;Both;10 reps Quad Sets: AROM;10 reps;Both;Supine Short Arc QuadBarbaraann Boys;Right;10 reps;Supine Heel Slides: AAROM;Right;10 reps;Supine Hip ABduction/ADduction: AAROM;Right;10 reps;Supine Straight Leg Raises: AAROM;Right;10 reps;Supine Goniometric ROM: 15-50 R knee.   PT Diagnosis:    PT Problem List:   PT Treatment  Interventions:     PT Goals (current goals can now be found in the care plan section)    Visit Information  Last PT Received On: 10/08/13 Assistance Needed: +1 History of Present Illness: RTKA    Subjective Data      Cognition  Cognition Arousal/Alertness: Awake/alert    Balance     End of Session PT - End of Session Equipment Utilized During Treatment: Right knee immobilizer Activity Tolerance: Patient tolerated treatment well Patient left: in bed;with call bell/phone within reach Nurse Communication: Mobility status   GP     Rada Hay 10/08/2013, 1:16 PM

## 2013-10-08 NOTE — Discharge Summary (Signed)
Physician Discharge Summary   Patient ID: [name] MRN: [id number] DOB/AGE: 77-May-1935 77 y.o.  Admit date: 10/05/2013 Discharge date: 10/08/2013  Primary Diagnosis:  Osteoarthritis Right knee(s)  Admission Diagnoses:  Past Medical History  Diagnosis Date  . Pacemaker     medtronic  . CAD (coronary artery disease)     echo 09-05-2010-EF nl, mod-severe dilated Left atrium; myoview 02-08-12-EF 52%, no ischemia  . HTN (hypertension)   . Barrett's esophagus   . Gout   . Arthritis   . History of cardioversion 02/09/13    electrical synchronized cardioversion, on flecainide and warfarin  . H/O transesophageal echocardiography (TEE) for monitoring 09/05/10    tee guided AT pace termination, did not proceed with cardioversion due to termination of the atrial flutter through his pacemaker  . Atrial fibrillation   . History of stomach ulcers     many yrs ago  . GERD (gastroesophageal reflux disease)   . History of kidney stones     per pt - no actual diagnosisi  . Cancer     prostate - radiation only  . Pacemaker 09-25-13  . Prostate cancer 09-25-13    tx. with radiation only- dx. 2013  . Diabetes mellitus without complication     "prediabetic"-no oral meds   Discharge Diagnoses:   Principal Problem:   OA (osteoarthritis) of knee Active Problems:   HTN (hypertension)   Postoperative anemia due to acute blood loss  Estimated body mass index is 31 kg/(m^2) as calculated from the following:   Height as of this encounter: 5\' 6"  (1.676 m).   Weight as of this encounter: 87.091 kg (192 lb).  Procedure:  Procedure(s) (LRB): RIGHT TOTAL KNEE ARTHROPLASTY (Right)   Consults: None  HPI: [name] is a 79 y.o. year old male with end stage OA of his right knee with progressively worsening pain and dysfunction. He has constant pain, with activity and at rest and significant functional deficits with difficulties even with ADLs. He has had extensive non-op management including  analgesics, injections of cortisone and viscosupplements, and home exercise program, but remains in significant pain with significant dysfunction. Radiographs show bone on bone arthritis medial and patellofemoral. He presents now for right Total Knee Arthroplasty.   Laboratory Data: Admission on 10/05/2013, Discharged on 10/08/2013  Component Date Value Range Status  . Prothrombin Time 10/05/2013 14.1  11.6 - 15.2 seconds Final  . INR 10/05/2013 1.11  0.00 - 1.49 Final  . Glucose-Capillary 10/05/2013 109* 70 - 99 mg/dL Final  . Comment 1 29/56/2130 Documented in Chart   Final  . Glucose-Capillary 10/05/2013 91  70 - 99 mg/dL Final  . WBC 86/57/8469 7.5  4.0 - 10.5 K/uL Final  . RBC 10/06/2013 3.62* 4.22 - 5.81 MIL/uL Final  . Hemoglobin 10/06/2013 10.7* 13.0 - 17.0 g/dL Final  . HCT 62/95/2841 32.7* 39.0 - 52.0 % Final  . MCV 10/06/2013 90.3  78.0 - 100.0 fL Final  . MCH 10/06/2013 29.6  26.0 - 34.0 pg Final  . MCHC 10/06/2013 32.7  30.0 - 36.0 g/dL Final  . RDW 32/44/0102 13.2  11.5 - 15.5 % Final  . Platelets 10/06/2013 157  150 - 400 K/uL Final  . Sodium 10/06/2013 135  135 - 145 mEq/L Final  . Potassium 10/06/2013 4.2  3.5 - 5.1 mEq/L Final  . Chloride 10/06/2013 99  96 - 112 mEq/L Final  . CO2 10/06/2013 27  19 - 32 mEq/L Final  . Glucose, Bld 10/06/2013 129*  70 - 99 mg/dL Final  . BUN 16/08/9603 20  6 - 23 mg/dL Final  . Creatinine, Ser 10/06/2013 1.11  0.50 - 1.35 mg/dL Final  . Calcium 54/07/8118 8.3* 8.4 - 10.5 mg/dL Final  . GFR calc non Af Amer 10/06/2013 62* >90 mL/min Final  . GFR calc Af Amer 10/06/2013 72* >90 mL/min Final   Comment: (NOTE)                          The eGFR has been calculated using the CKD EPI equation.                          This calculation has not been validated in all clinical situations.                          eGFR's persistently <90 mL/min signify possible Chronic Kidney                          Disease.  Marland Kitchen Prothrombin Time 10/06/2013 15.2   11.6 - 15.2 seconds Final  . INR 10/06/2013 1.23  0.00 - 1.49 Final  . WBC 10/07/2013 10.2  4.0 - 10.5 K/uL Final  . RBC 10/07/2013 3.14* 4.22 - 5.81 MIL/uL Final  . Hemoglobin 10/07/2013 9.4* 13.0 - 17.0 g/dL Final  . HCT 14/78/2956 28.0* 39.0 - 52.0 % Final  . MCV 10/07/2013 89.2  78.0 - 100.0 fL Final  . MCH 10/07/2013 29.9  26.0 - 34.0 pg Final  . MCHC 10/07/2013 33.6  30.0 - 36.0 g/dL Final  . RDW 21/30/8657 13.4  11.5 - 15.5 % Final  . Platelets 10/07/2013 161  150 - 400 K/uL Final  . Sodium 10/07/2013 130* 135 - 145 mEq/L Final  . Potassium 10/07/2013 3.7  3.5 - 5.1 mEq/L Final  . Chloride 10/07/2013 97  96 - 112 mEq/L Final  . CO2 10/07/2013 24  19 - 32 mEq/L Final  . Glucose, Bld 10/07/2013 161* 70 - 99 mg/dL Final  . BUN 84/69/6295 24* 6 - 23 mg/dL Final  . Creatinine, Ser 10/07/2013 1.15  0.50 - 1.35 mg/dL Final  . Calcium 28/41/3244 8.3* 8.4 - 10.5 mg/dL Final  . GFR calc non Af Amer 10/07/2013 60* >90 mL/min Final  . GFR calc Af Amer 10/07/2013 69* >90 mL/min Final   Comment: (NOTE)                          The eGFR has been calculated using the CKD EPI equation.                          This calculation has not been validated in all clinical situations.                          eGFR's persistently <90 mL/min signify possible Chronic Kidney                          Disease.  Marland Kitchen Prothrombin Time 10/07/2013 18.3* 11.6 - 15.2 seconds Final  . INR 10/07/2013 1.57* 0.00 - 1.49 Final  . WBC 10/08/2013 9.0  4.0 - 10.5 K/uL Final  . RBC 10/08/2013 3.12* 4.22 - 5.81 MIL/uL Final  . Hemoglobin 10/08/2013  9.4* 13.0 - 17.0 g/dL Final  . HCT 16/08/9603 27.6* 39.0 - 52.0 % Final  . MCV 10/08/2013 88.5  78.0 - 100.0 fL Final  . MCH 10/08/2013 30.1  26.0 - 34.0 pg Final  . MCHC 10/08/2013 34.1  30.0 - 36.0 g/dL Final  . RDW 54/07/8118 13.2  11.5 - 15.5 % Final  . Platelets 10/08/2013 177  150 - 400 K/uL Final  . Prothrombin Time 10/08/2013 19.9* 11.6 - 15.2 seconds Final  . INR  10/08/2013 1.75* 0.00 - 1.49 Final  Hospital Outpatient Visit on 09/25/2013  Component Date Value Range Status  . aPTT 09/25/2013 46* 24 - 37 seconds Final   Comment:                                 IF BASELINE aPTT IS ELEVATED,                          SUGGEST PATIENT RISK ASSESSMENT                          BE USED TO DETERMINE APPROPRIATE                          ANTICOAGULANT THERAPY.  . WBC 09/25/2013 4.5  4.0 - 10.5 K/uL Final  . RBC 09/25/2013 4.38  4.22 - 5.81 MIL/uL Final  . Hemoglobin 09/25/2013 13.2  13.0 - 17.0 g/dL Final  . HCT 14/78/2956 39.6  39.0 - 52.0 % Final  . MCV 09/25/2013 90.4  78.0 - 100.0 fL Final  . MCH 09/25/2013 30.1  26.0 - 34.0 pg Final  . MCHC 09/25/2013 33.3  30.0 - 36.0 g/dL Final  . RDW 21/30/8657 13.1  11.5 - 15.5 % Final  . Platelets 09/25/2013 162  150 - 400 K/uL Final  . Sodium 09/25/2013 136  135 - 145 mEq/L Final  . Potassium 09/25/2013 3.9  3.5 - 5.1 mEq/L Final  . Chloride 09/25/2013 102  96 - 112 mEq/L Final  . CO2 09/25/2013 25  19 - 32 mEq/L Final  . Glucose, Bld 09/25/2013 116* 70 - 99 mg/dL Final  . BUN 84/69/6295 22  6 - 23 mg/dL Final  . Creatinine, Ser 09/25/2013 1.03  0.50 - 1.35 mg/dL Final  . Calcium 28/41/3244 9.2  8.4 - 10.5 mg/dL Final  . Total Protein 09/25/2013 6.6  6.0 - 8.3 g/dL Final  . Albumin 11/07/7251 3.7  3.5 - 5.2 g/dL Final  . AST 66/44/0347 19  0 - 37 U/L Final  . ALT 09/25/2013 13  0 - 53 U/L Final  . Alkaline Phosphatase 09/25/2013 40  39 - 117 U/L Final  . Total Bilirubin 09/25/2013 0.5  0.3 - 1.2 mg/dL Final  . GFR calc non Af Amer 09/25/2013 68* >90 mL/min Final  . GFR calc Af Amer 09/25/2013 79* >90 mL/min Final   Comment: (NOTE)                          The eGFR has been calculated using the CKD EPI equation.                          This calculation has not been validated in all clinical situations.  eGFR's persistently <90 mL/min signify possible Chronic Kidney                           Disease.  Marland Kitchen Prothrombin Time 09/25/2013 25.0* 11.6 - 15.2 seconds Final  . INR 09/25/2013 2.36* 0.00 - 1.49 Final  . ABO/RH(D) 09/25/2013 O POS   Final  . Antibody Screen 09/25/2013 NEG   Final  . Sample Expiration 09/25/2013 10/08/2013   Final  . Color, Urine 09/25/2013 YELLOW  YELLOW Final  . APPearance 09/25/2013 CLEAR  CLEAR Final  . Specific Gravity, Urine 09/25/2013 1.031* 1.005 - 1.030 Final  . pH 09/25/2013 5.0  5.0 - 8.0 Final  . Glucose, UA 09/25/2013 NEGATIVE  NEGATIVE mg/dL Final  . Hgb urine dipstick 09/25/2013 NEGATIVE  NEGATIVE Final  . Bilirubin Urine 09/25/2013 NEGATIVE  NEGATIVE Final  . Ketones, ur 09/25/2013 NEGATIVE  NEGATIVE mg/dL Final  . Protein, ur 16/08/9603 NEGATIVE  NEGATIVE mg/dL Final  . Urobilinogen, UA 09/25/2013 0.2  0.0 - 1.0 mg/dL Final  . Nitrite 54/07/8118 NEGATIVE  NEGATIVE Final  . Leukocytes, UA 09/25/2013 NEGATIVE  NEGATIVE Final   MICROSCOPIC NOT DONE ON URINES WITH NEGATIVE PROTEIN, BLOOD, LEUKOCYTES, NITRITE, OR GLUCOSE <1000 mg/dL.  Marland Kitchen MRSA, PCR 09/25/2013 NEGATIVE  NEGATIVE Final  . Staphylococcus aureus 09/25/2013 NEGATIVE  NEGATIVE Final   Comment:                                 The Xpert SA Assay (FDA                          approved for NASAL specimens                          in patients over 53 years of age),                          is one component of                          a comprehensive surveillance                          program.  Test performance has                          been validated by [employer] for patients greater                          than or equal to 63 year old.                          It is not intended                          to diagnose infection nor to                          guide or monitor treatment.  Anti-coag visit on 09/02/2013  Component Date Value Range Status  . INR 09/02/2013 2   Final     X-Rays:No results found.  EKG: Orders placed in visit on  05/19/13  . EKG 12-LEAD     Hospital Course: [name] is a 79 y.o. who was admitted to [facility]. They were brought to the operating room on 10/05/2013 and underwent Procedure(s): RIGHT TOTAL KNEE ARTHROPLASTY.  Patient tolerated the procedure well and was later transferred to the recovery room and then to the orthopaedic floor for postoperative care.  They were given PO and IV analgesics for pain control following their surgery.  They were given 24 hours of postoperative antibiotics of  Anti-infectives   Start     Dose/Rate Route Frequency Ordered Stop   10/05/13 1800  ceFAZolin (ANCEF) IVPB 2 g/50 mL premix     2 g 100 mL/hr over 30 Minutes Intravenous Every 6 hours 10/05/13 1616 10/06/13 0033   10/05/13 1000  ceFAZolin (ANCEF) IVPB 2 g/50 mL premix     2 g 100 mL/hr over 30 Minutes Intravenous On call to O.R. 10/05/13 0954 10/05/13 1300     and started on DVT prophylaxis in the form of Lovenox and Coumadin.   PT and OT were ordered for total joint protocol.  Discharge planning consulted to help with postop disposition and equipment needs.  Patient had a decent night on the evening of surgery.  They started to get up OOB with therapy on day one.He was noted to have a low UOP since MN. Kept foley that morning and monitoring output. Hemovac drain was pulled without difficulty.  Continued to work with therapy into day two.  Dressing was changed on day two and the incision was healing well.  By day three, the patient had progressed with therapy and meeting their goals.  Getting up and down better. Already moved his bowels. Incision was healing well.  Patient was seen in rounds and was ready to go home.   Discharge Medications: Prior to Admission medications   Medication Sig Start Date End Date Taking? Authorizing Provider  acetaminophen (TYLENOL) 650 MG CR tablet Take 650 mg by mouth every 8 (eight) hours as needed for pain.    Yes Historical Provider, MD  allopurinol (ZYLOPRIM)  100 MG tablet Take 100 mg by mouth at bedtime.    Yes Historical Provider, MD  metoprolol succinate (TOPROL-XL) 50 MG 24 hr tablet Take 50 mg by mouth every morning. Take with or immediately following a meal.   Yes Historical Provider, MD  pantoprazole (PROTONIX) 40 MG tablet Take 40 mg by mouth daily.   Yes Historical Provider, MD  Potassium 99 MG TABS Take 1 tablet by mouth daily.   Yes Historical Provider, MD  enoxaparin (LOVENOX) 30 MG/0.3ML injection Inject 0.3 mLs (30 mg total) into the skin every 12 (twelve) hours. Continue Lovenox injections until the INR is therapeutic at or greater than 2.0.  When INR reaches the therapeutic level of equal to or greater than 2.0, the patient may discontinue the Lovenox injections. 10/08/13   Alexzandrew Perkins, PA-C  methocarbamol (ROBAXIN) 500 MG tablet Take 1 tablet (500 mg total) by mouth every 6 (six) hours as needed for muscle spasms. 10/08/13   Alexzandrew Perkins, PA-C  oxyCODONE (OXY IR/ROXICODONE) 5 MG immediate release tablet Take 1-2 tablets (5-10 mg total) by mouth every 3 (three) hours as needed for breakthrough pain. 10/08/13   Alexzandrew Perkins, PA-C  traMADol (ULTRAM) 50 MG  tablet Take 1-2 tablets (50-100 mg total) by mouth every 6 (six) hours as needed (mild pain). 10/08/13   Alexzandrew Julien Girt, PA-C  warfarin (COUMADIN) 2.5 MG tablet Take 1 tablet (2.5 mg total) by mouth daily. Take for 3 weeks for postop protocol, then resume their previous Coumadin home regimen.  Adjust dose based upon the INR.  Follow the INR and titrate Coumadin dose for a therapeutic range between 2.0 and 3.0 INR.  After completing the three weeks of Coumadin, the patient may resume their previous Coumadin home regimen. Home Dosing - Take 2.5mg  daily except take no Coumadin on Mondays and Fridays. 10/08/13   Alexzandrew Julien Girt, PA-C   Discharge home with home health  Diet - Cardiac diet and Diabetic diet  Follow up - in 2 weeks  Activity - WBAT  Disposition - Home    Condition Upon Discharge - Improving  D/C Meds - See DC Summary  DVT Prophylaxis - Lovenox and Coumadin  INR - 1.75      Discharge Orders   Future Appointments Provider Department Dept Phone   10/20/2013 6:00 AM [facility] [phone]   Future Orders Complete By Expires   Call MD / Call 911  As directed    Comments:     If you experience chest pain or shortness of breath, CALL 911 and be transported to the hospital emergency room.  If you develope a fever above 101 F, pus (white drainage) or increased drainage or redness at the wound, or calf pain, call your surgeon's office.   Change dressing  As directed    Comments:     Change dressing daily with sterile 4 x 4 inch gauze dressing and apply TED hose. Do not submerge the incision under water.   Constipation Prevention  As directed    Comments:     Drink plenty of fluids.  Prune juice may be helpful.  You may use a stool softener, such as Colace (over the counter) 100 mg twice a day.  Use MiraLax (over the counter) for constipation as needed.   Diet - low sodium heart healthy  As directed    Diet Carb Modified  As directed    Discharge instructions  As directed    Comments:     Pick up stool softner and laxative for home. Do not submerge incision under water. May shower. Continue to use ice for pain and swelling from surgery.  Take Coumadin for three weeks for postoperative protocol and then the patient may resume their previous Coumadin home regimen.  The dose may need to be adjusted based upon the INR.  Please follow the INR and titrate Coumadin dose for a therapeutic range between 2.0 and 3.0 INR.  After completing the three weeks of Coumadin, the patient may resume their previous Coumadin home regimen.  Continue Lovenox injections until the INR is therapeutic at or greater than 2.0.  When INR reaches the therapeutic level of equal to or greater than 2.0, the patient may discontinue the  Lovenox injections.   Do not put a pillow under the knee. Place it under the heel.  As directed    Do not sit on low chairs, stoools or toilet seats, as it may be difficult to get up from low surfaces  As directed    Driving restrictions  As directed    Comments:     No driving until released by the physician.   Increase activity slowly as tolerated  As directed  Lifting restrictions  As directed    Comments:     No lifting until released by the physician.   Patient may shower  As directed    Comments:     You may shower without a dressing once there is no drainage.  Do not wash over the wound.  If drainage remains, do not shower until drainage stops.   TED hose  As directed    Comments:     Use stockings (TED hose) for 3 weeks on both leg(s).  You may remove them at night for sleeping.   Weight bearing as tolerated  As directed    Questions:     Laterality:     Extremity:         Medication List    STOP taking these medications       CALCIUM 600 + D PO      TAKE these medications       acetaminophen 650 MG CR tablet  Commonly known as:  TYLENOL  Take 650 mg by mouth every 8 (eight) hours as needed for pain.     allopurinol 100 MG tablet  Commonly known as:  ZYLOPRIM  Take 100 mg by mouth at bedtime.     enoxaparin 30 MG/0.3ML injection  Commonly known as:  LOVENOX  Inject 0.3 mLs (30 mg total) into the skin every 12 (twelve) hours. Continue Lovenox injections until the INR is therapeutic at or greater than 2.0.  When INR reaches the therapeutic level of equal to or greater than 2.0, the patient may discontinue the Lovenox injections.     methocarbamol 500 MG tablet  Commonly known as:  ROBAXIN  Take 1 tablet (500 mg total) by mouth every 6 (six) hours as needed for muscle spasms.     metoprolol succinate 50 MG 24 hr tablet  Commonly known as:  TOPROL-XL  Take 50 mg by mouth every morning. Take with or immediately following a meal.     oxyCODONE 5 MG immediate  release tablet  Commonly known as:  Oxy IR/ROXICODONE  Take 1-2 tablets (5-10 mg total) by mouth every 3 (three) hours as needed for breakthrough pain.     Potassium 99 MG Tabs  Take 1 tablet by mouth daily.     traMADol 50 MG tablet  Commonly known as:  ULTRAM  Take 1-2 tablets (50-100 mg total) by mouth every 6 (six) hours as needed (mild pain).     warfarin 2.5 MG tablet  Commonly known as:  COUMADIN  - Take 1 tablet (2.5 mg total) by mouth daily. Take for 3 weeks for postop protocol, then resume their previous Coumadin home regimen.  Adjust dose based upon the INR.  Follow the INR and titrate Coumadin dose for a therapeutic range between 2.0 and 3.0 INR.  After completing the three weeks of Coumadin, the patient may resume their previous Coumadin home regimen.  - Home Dosing - Take 2.5mg  daily except take no Coumadin on Mondays and Fridays.       Follow-up Information   Follow up with Loanne Drilling, MD. Schedule an appointment as soon as possible for a visit in 2 weeks.   Specialty:  Orthopedic Surgery   Contact information:   [address] [place] [address] [phone]       Signed: Patrica Duel 10/13/2013, 9:30 AM

## 2013-10-08 NOTE — Progress Notes (Addendum)
ANTICOAGULATION CONSULT NOTE   Pharmacy Consult for Coumadin Indication: atrial fibrillation, VTE prophylaxis  Allergies  Allergen Reactions  . No Known Allergies     Patient Measurements: Height: 5\' 6"  (167.6 cm) Weight: 192 lb (87.091 kg) IBW/kg (Calculated) : 63.8   Vital Signs: Temp: 98.8 F (37.1 C) (12/04 0520) Temp src: Oral (12/04 0520) BP: 147/89 mmHg (12/04 0520) Pulse Rate: 98 (12/04 0520)  Labs:  Recent Labs  10/06/13 0501 10/07/13 0452 10/08/13 0519  HGB 10.7* 9.4* 9.4*  HCT 32.7* 28.0* 27.6*  PLT 157 161 177  LABPROT 15.2 18.3* 19.9*  INR 1.23 1.57* 1.75*  CREATININE 1.11 1.15  --     Estimated Creatinine Clearance: 55.6 ml/min (by C-G formula based on Cr of 1.15).   Medical History: Past Medical History  Diagnosis Date  . Pacemaker     medtronic  . CAD (coronary artery disease)     echo 09-05-2010-EF nl, mod-severe dilated Left atrium; myoview 02-08-12-EF 52%, no ischemia  . HTN (hypertension)   . Barrett's esophagus   . Gout   . Arthritis   . History of cardioversion 02/09/13    electrical synchronized cardioversion, on flecainide and warfarin  . H/O transesophageal echocardiography (TEE) for monitoring 09/05/10    tee guided AT pace termination, did not proceed with cardioversion due to termination of the atrial flutter through his pacemaker  . Atrial fibrillation   . History of stomach ulcers     many yrs ago  . GERD (gastroesophageal reflux disease)   . History of kidney stones     per pt - no actual diagnosisi  . Cancer     prostate - radiation only  . Pacemaker 09-25-13  . Prostate cancer 09-25-13    tx. with radiation only- dx. 2013  . Diabetes mellitus without complication     "prediabetic"-no oral meds     Assessment: 6 yoM admitted 12/1 for R TKA. PMH significant for atrial fibrillation, sinus node dysfunction with a pacemaker, on chronic anticoagulation with Coumadin. His Coumadin [facility]   His Coumadin dose PTA was 2.5mg  daily except no Coumadin on Mon and Fri for a total weekly dose of 12.5mg   he has been stable on this dosing regimen for several months with last 5 outpatient INRs within goal range of 2-3.   Patient was instructed to stop Coumadin 7 days prior to surgery and patient's last dose was on 11/23.   INR on admit 1.11  Today's INR = 1.75 (trending up)  CBC: Hgb = 9.4 (stable but decreased post-op, suspect ABLA), platelets stable  CBC pre-op = 13.2, pt noted with baseline plts~150K  Patient also with enoxaparin 30mg  SQ BID until INR >/= 1.8  Goal of Therapy:  INR 2-3 Monitor platelets by anticoagulation protocol: Yes   Plan:  - Coumadin 2.5mg  PO x 1 tonight at 2000  - Continue home dosing for now  - Daily PT/INR and CBC at least q72h - Monitor for bleeding - Plan home today  Thank you for the consult.  Juliette Alcide, PharmD, BCPS.   Pager: [phone] 10/08/2013 10:58 AM

## 2013-10-08 NOTE — Progress Notes (Signed)
Subjective: 3 Days Post-Op Procedure(s) (LRB): RIGHT TOTAL KNEE ARTHROPLASTY (Right) Patient reports pain as mild.  Getting up and down better. Already moved his bowels. Patient seen in rounds for Dr. Lequita Halt. Patient is well, and has had no acute complaints or problems Patient is ready to go home later today.  Objective: Vital signs in last 24 hours: Temp:  [98.8 F (37.1 C)-100.1 F (37.8 C)] 98.8 F (37.1 C) (12/04 0520) Pulse Rate:  [76-100] 98 (12/04 0520) Resp:  [14-16] 16 (12/04 0520) BP: (127-157)/(77-89) 147/89 mmHg (12/04 0520) SpO2:  [93 %-98 %] 96 % (12/04 0520)  Intake/Output from previous day:  Intake/Output Summary (Last 24 hours) at 10/08/13 0726 Last data filed at 10/08/13 0537  Gross per 24 hour  Intake    360 ml  Output   1000 ml  Net   -640 ml    Intake/Output this shift:    Labs:  Recent Labs  10/06/13 0501 10/07/13 0452 10/08/13 0519  HGB 10.7* 9.4* 9.4*    Recent Labs  10/07/13 0452 10/08/13 0519  WBC 10.2 9.0  RBC 3.14* 3.12*  HCT 28.0* 27.6*  PLT 161 177    Recent Labs  10/06/13 0501 10/07/13 0452  NA 135 130*  K 4.2 3.7  CL 99 97  CO2 27 24  BUN 20 24*  CREATININE 1.11 1.15  GLUCOSE 129* 161*  CALCIUM 8.3* 8.3*    Recent Labs  10/07/13 0452 10/08/13 0519  INR 1.57* 1.75*    EXAM: General - Patient is Alert, Appropriate and Oriented Extremity - Neurovascular intact Sensation intact distally Dorsiflexion/Plantar flexion intact Incision - clean, dry, blisters noted Motor Function - intact, moving foot and toes well on exam.   Assessment/Plan: 3 Days Post-Op Procedure(s) (LRB): RIGHT TOTAL KNEE ARTHROPLASTY (Right) Procedure(s) (LRB): RIGHT TOTAL KNEE ARTHROPLASTY (Right) Past Medical History  Diagnosis Date  . Pacemaker     medtronic  . CAD (coronary artery disease)     echo 09-05-2010-EF nl, mod-severe dilated Left atrium; myoview 02-08-12-EF 52%, no ischemia  . HTN (hypertension)   . Barrett's  esophagus   . Gout   . Arthritis   . History of cardioversion 02/09/13    electrical synchronized cardioversion, on flecainide and warfarin  . H/O transesophageal echocardiography (TEE) for monitoring 09/05/10    tee guided AT pace termination, did not proceed with cardioversion due to termination of the atrial flutter through his pacemaker  . Atrial fibrillation   . History of stomach ulcers     many yrs ago  . GERD (gastroesophageal reflux disease)   . History of kidney stones     per pt - no actual diagnosisi  . Cancer     prostate - radiation only  . Pacemaker 09-25-13  . Prostate cancer 09-25-13    tx. with radiation only- dx. 2013  . Diabetes mellitus without complication     "prediabetic"-no oral meds   Principal Problem:   OA (osteoarthritis) of knee Active Problems:   HTN (hypertension)   Postoperative anemia due to acute blood loss  Estimated body mass index is 31 kg/(m^2) as calculated from the following:   Height as of this encounter: 5\' 6"  (1.676 m).   Weight as of this encounter: 87.091 kg (192 lb). Up with therapy Discharge home with home health Diet - Cardiac diet and Diabetic diet Follow up - in 2 weeks Activity - WBAT Disposition - Home Condition Upon Discharge - Improving D/C Meds - See DC Summary DVT Prophylaxis -  Lovenox and Coumadin INR - 1.75  Khaleem Burchill 10/08/2013, 7:26 AM

## 2013-10-09 DIAGNOSIS — Z5181 Encounter for therapeutic drug level monitoring: Secondary | ICD-10-CM | POA: Diagnosis not present

## 2013-10-09 DIAGNOSIS — Z950 Presence of cardiac pacemaker: Secondary | ICD-10-CM | POA: Diagnosis not present

## 2013-10-09 DIAGNOSIS — K2270 Barrett's esophagus without dysplasia: Secondary | ICD-10-CM | POA: Diagnosis not present

## 2013-10-09 DIAGNOSIS — Z96659 Presence of unspecified artificial knee joint: Secondary | ICD-10-CM | POA: Diagnosis not present

## 2013-10-09 DIAGNOSIS — I2510 Atherosclerotic heart disease of native coronary artery without angina pectoris: Secondary | ICD-10-CM | POA: Diagnosis not present

## 2013-10-09 DIAGNOSIS — Z7901 Long term (current) use of anticoagulants: Secondary | ICD-10-CM | POA: Diagnosis not present

## 2013-10-09 DIAGNOSIS — E119 Type 2 diabetes mellitus without complications: Secondary | ICD-10-CM | POA: Diagnosis not present

## 2013-10-09 DIAGNOSIS — I10 Essential (primary) hypertension: Secondary | ICD-10-CM | POA: Diagnosis not present

## 2013-10-09 DIAGNOSIS — I4891 Unspecified atrial fibrillation: Secondary | ICD-10-CM | POA: Diagnosis not present

## 2013-10-09 DIAGNOSIS — M1710 Unilateral primary osteoarthritis, unspecified knee: Secondary | ICD-10-CM | POA: Diagnosis not present

## 2013-10-09 DIAGNOSIS — E785 Hyperlipidemia, unspecified: Secondary | ICD-10-CM | POA: Diagnosis not present

## 2013-10-09 DIAGNOSIS — R791 Abnormal coagulation profile: Secondary | ICD-10-CM | POA: Diagnosis not present

## 2013-10-09 DIAGNOSIS — M159 Polyosteoarthritis, unspecified: Secondary | ICD-10-CM | POA: Diagnosis not present

## 2013-10-09 DIAGNOSIS — Z471 Aftercare following joint replacement surgery: Secondary | ICD-10-CM | POA: Diagnosis not present

## 2013-10-12 ENCOUNTER — Other Ambulatory Visit: Payer: Self-pay | Admitting: *Deleted

## 2013-10-12 DIAGNOSIS — E119 Type 2 diabetes mellitus without complications: Secondary | ICD-10-CM | POA: Diagnosis not present

## 2013-10-12 DIAGNOSIS — M159 Polyosteoarthritis, unspecified: Secondary | ICD-10-CM | POA: Diagnosis not present

## 2013-10-12 DIAGNOSIS — I2510 Atherosclerotic heart disease of native coronary artery without angina pectoris: Secondary | ICD-10-CM | POA: Diagnosis not present

## 2013-10-12 DIAGNOSIS — Z471 Aftercare following joint replacement surgery: Secondary | ICD-10-CM | POA: Diagnosis not present

## 2013-10-12 DIAGNOSIS — I10 Essential (primary) hypertension: Secondary | ICD-10-CM | POA: Diagnosis not present

## 2013-10-12 DIAGNOSIS — I4891 Unspecified atrial fibrillation: Secondary | ICD-10-CM | POA: Diagnosis not present

## 2013-10-12 DIAGNOSIS — Z7901 Long term (current) use of anticoagulants: Secondary | ICD-10-CM | POA: Diagnosis not present

## 2013-10-12 MED ORDER — PANTOPRAZOLE SODIUM 40 MG PO TBEC: 40.0000 mg | DELAYED_RELEASE_TABLET | Freq: Every day | ORAL | Status: DC

## 2013-10-12 NOTE — Telephone Encounter (Signed)
Rx was sent to pharmacy electronically. 

## 2013-10-14 DIAGNOSIS — I2510 Atherosclerotic heart disease of native coronary artery without angina pectoris: Secondary | ICD-10-CM | POA: Diagnosis not present

## 2013-10-14 DIAGNOSIS — I4891 Unspecified atrial fibrillation: Secondary | ICD-10-CM | POA: Diagnosis not present

## 2013-10-14 DIAGNOSIS — E119 Type 2 diabetes mellitus without complications: Secondary | ICD-10-CM | POA: Diagnosis not present

## 2013-10-14 DIAGNOSIS — I10 Essential (primary) hypertension: Secondary | ICD-10-CM | POA: Diagnosis not present

## 2013-10-14 DIAGNOSIS — Z471 Aftercare following joint replacement surgery: Secondary | ICD-10-CM | POA: Diagnosis not present

## 2013-10-14 DIAGNOSIS — M159 Polyosteoarthritis, unspecified: Secondary | ICD-10-CM | POA: Diagnosis not present

## 2013-10-15 DIAGNOSIS — I2510 Atherosclerotic heart disease of native coronary artery without angina pectoris: Secondary | ICD-10-CM | POA: Diagnosis not present

## 2013-10-15 DIAGNOSIS — I4891 Unspecified atrial fibrillation: Secondary | ICD-10-CM | POA: Diagnosis not present

## 2013-10-15 DIAGNOSIS — E119 Type 2 diabetes mellitus without complications: Secondary | ICD-10-CM | POA: Diagnosis not present

## 2013-10-15 DIAGNOSIS — Z471 Aftercare following joint replacement surgery: Secondary | ICD-10-CM | POA: Diagnosis not present

## 2013-10-15 DIAGNOSIS — M159 Polyosteoarthritis, unspecified: Secondary | ICD-10-CM | POA: Diagnosis not present

## 2013-10-15 DIAGNOSIS — I10 Essential (primary) hypertension: Secondary | ICD-10-CM | POA: Diagnosis not present

## 2013-10-19 DIAGNOSIS — M159 Polyosteoarthritis, unspecified: Secondary | ICD-10-CM | POA: Diagnosis not present

## 2013-10-19 DIAGNOSIS — E119 Type 2 diabetes mellitus without complications: Secondary | ICD-10-CM | POA: Diagnosis not present

## 2013-10-19 DIAGNOSIS — Z471 Aftercare following joint replacement surgery: Secondary | ICD-10-CM | POA: Diagnosis not present

## 2013-10-19 DIAGNOSIS — I10 Essential (primary) hypertension: Secondary | ICD-10-CM | POA: Diagnosis not present

## 2013-10-19 DIAGNOSIS — I2510 Atherosclerotic heart disease of native coronary artery without angina pectoris: Secondary | ICD-10-CM | POA: Diagnosis not present

## 2013-10-19 DIAGNOSIS — I4891 Unspecified atrial fibrillation: Secondary | ICD-10-CM | POA: Diagnosis not present

## 2013-10-21 DIAGNOSIS — M159 Polyosteoarthritis, unspecified: Secondary | ICD-10-CM | POA: Diagnosis not present

## 2013-10-21 DIAGNOSIS — I10 Essential (primary) hypertension: Secondary | ICD-10-CM | POA: Diagnosis not present

## 2013-10-21 DIAGNOSIS — E119 Type 2 diabetes mellitus without complications: Secondary | ICD-10-CM | POA: Diagnosis not present

## 2013-10-21 DIAGNOSIS — Z471 Aftercare following joint replacement surgery: Secondary | ICD-10-CM | POA: Diagnosis not present

## 2013-10-21 DIAGNOSIS — I2510 Atherosclerotic heart disease of native coronary artery without angina pectoris: Secondary | ICD-10-CM | POA: Diagnosis not present

## 2013-10-21 DIAGNOSIS — I4891 Unspecified atrial fibrillation: Secondary | ICD-10-CM | POA: Diagnosis not present

## 2013-10-22 ENCOUNTER — Ambulatory Visit: Payer: Medicare Other | Admitting: Pharmacist Clinician (PhC)/ Clinical Pharmacy Specialist

## 2013-10-22 DIAGNOSIS — I2510 Atherosclerotic heart disease of native coronary artery without angina pectoris: Secondary | ICD-10-CM | POA: Diagnosis not present

## 2013-10-22 DIAGNOSIS — M159 Polyosteoarthritis, unspecified: Secondary | ICD-10-CM | POA: Diagnosis not present

## 2013-10-22 DIAGNOSIS — Z471 Aftercare following joint replacement surgery: Secondary | ICD-10-CM | POA: Diagnosis not present

## 2013-10-22 DIAGNOSIS — I10 Essential (primary) hypertension: Secondary | ICD-10-CM | POA: Diagnosis not present

## 2013-10-22 DIAGNOSIS — E119 Type 2 diabetes mellitus without complications: Secondary | ICD-10-CM | POA: Diagnosis not present

## 2013-10-22 DIAGNOSIS — I4891 Unspecified atrial fibrillation: Secondary | ICD-10-CM | POA: Diagnosis not present

## 2013-10-23 DIAGNOSIS — E119 Type 2 diabetes mellitus without complications: Secondary | ICD-10-CM | POA: Diagnosis not present

## 2013-10-23 DIAGNOSIS — I4891 Unspecified atrial fibrillation: Secondary | ICD-10-CM | POA: Diagnosis not present

## 2013-10-23 DIAGNOSIS — I10 Essential (primary) hypertension: Secondary | ICD-10-CM | POA: Diagnosis not present

## 2013-10-23 DIAGNOSIS — I2510 Atherosclerotic heart disease of native coronary artery without angina pectoris: Secondary | ICD-10-CM | POA: Diagnosis not present

## 2013-10-23 DIAGNOSIS — Z471 Aftercare following joint replacement surgery: Secondary | ICD-10-CM | POA: Diagnosis not present

## 2013-10-23 DIAGNOSIS — M159 Polyosteoarthritis, unspecified: Secondary | ICD-10-CM | POA: Diagnosis not present

## 2013-10-26 ENCOUNTER — Telehealth: Payer: Self-pay | Admitting: Pharmacist Clinician (PhC)/ Clinical Pharmacy Specialist

## 2013-10-26 DIAGNOSIS — M159 Polyosteoarthritis, unspecified: Secondary | ICD-10-CM | POA: Diagnosis not present

## 2013-10-26 DIAGNOSIS — E119 Type 2 diabetes mellitus without complications: Secondary | ICD-10-CM | POA: Diagnosis not present

## 2013-10-26 DIAGNOSIS — Z471 Aftercare following joint replacement surgery: Secondary | ICD-10-CM | POA: Diagnosis not present

## 2013-10-26 DIAGNOSIS — I4891 Unspecified atrial fibrillation: Secondary | ICD-10-CM | POA: Diagnosis not present

## 2013-10-26 DIAGNOSIS — I10 Essential (primary) hypertension: Secondary | ICD-10-CM | POA: Diagnosis not present

## 2013-10-26 DIAGNOSIS — I2510 Atherosclerotic heart disease of native coronary artery without angina pectoris: Secondary | ICD-10-CM | POA: Diagnosis not present

## 2013-10-26 NOTE — Telephone Encounter (Signed)
Sarah from [facility] called to let us know [name] is being d/c form HH today, INR 3.0.  Needs to schedule f/u with our office  Spoke with [name] and [name], we will see him at same time as [name] appt on Jan 2.

## 2013-10-28 DIAGNOSIS — Z471 Aftercare following joint replacement surgery: Secondary | ICD-10-CM | POA: Diagnosis not present

## 2013-10-28 DIAGNOSIS — I10 Essential (primary) hypertension: Secondary | ICD-10-CM | POA: Diagnosis not present

## 2013-10-28 DIAGNOSIS — I2510 Atherosclerotic heart disease of native coronary artery without angina pectoris: Secondary | ICD-10-CM | POA: Diagnosis not present

## 2013-10-28 DIAGNOSIS — M159 Polyosteoarthritis, unspecified: Secondary | ICD-10-CM | POA: Diagnosis not present

## 2013-10-28 DIAGNOSIS — E119 Type 2 diabetes mellitus without complications: Secondary | ICD-10-CM | POA: Diagnosis not present

## 2013-10-28 DIAGNOSIS — I4891 Unspecified atrial fibrillation: Secondary | ICD-10-CM | POA: Diagnosis not present

## 2013-11-03 ENCOUNTER — Ambulatory Visit (HOSPITAL_COMMUNITY)
Admission: RE | Admit: 2013-11-03 | Discharge: 2013-11-03 | Disposition: A | Discharge status: Home / Self Care | Payer: Medicare Other | Source: Ambulatory Visit | Attending: Orthopedic Surgery | Admitting: Orthopedic Surgery

## 2013-11-03 DIAGNOSIS — R262 Difficulty in walking, not elsewhere classified: Secondary | ICD-10-CM | POA: Insufficient documentation

## 2013-11-03 DIAGNOSIS — I10 Essential (primary) hypertension: Secondary | ICD-10-CM | POA: Insufficient documentation

## 2013-11-03 DIAGNOSIS — M25661 Stiffness of right knee, not elsewhere classified: Secondary | ICD-10-CM | POA: Insufficient documentation

## 2013-11-03 DIAGNOSIS — IMO0001 Reserved for inherently not codable concepts without codable children: Secondary | ICD-10-CM | POA: Diagnosis not present

## 2013-11-03 DIAGNOSIS — M25569 Pain in unspecified knee: Secondary | ICD-10-CM | POA: Insufficient documentation

## 2013-11-03 DIAGNOSIS — M25669 Stiffness of unspecified knee, not elsewhere classified: Secondary | ICD-10-CM | POA: Insufficient documentation

## 2013-11-03 NOTE — Evaluation (Signed)
Physical Therapy Evaluation  Patient Details  Name: [name] MRN: [id number] Date of Birth: 1934-08-14  Today's Date: 11/03/2013 Time: 1410-1510 PT Time Calculation (min): 60 min Charges: 1 evaluation Manual: 1450-1500 Ice: 1500-1510             Visit#: 1 of 12  Re-eval: 12/03/13 Assessment Diagnosis: R TKR  Surgical Date: 10/05/13 Next MD Visit: Dr. Alusio1/8/15  Authorization: Medicare    Authorization Time Period:    Authorization Visit#: 1 of 10   Past Medical History:  Past Medical History  Diagnosis Date  . Pacemaker     medtronic  . CAD (coronary artery disease)     echo 09-05-2010-EF nl, mod-severe dilated Left atrium; myoview 02-08-12-EF 52%, no ischemia  . HTN (hypertension)   . Barrett's esophagus   . Gout   . Arthritis   . History of cardioversion 02/09/13    electrical synchronized cardioversion, on flecainide and warfarin  . H/O transesophageal echocardiography (TEE) for monitoring 09/05/10    tee guided AT pace termination, did not proceed with cardioversion due to termination of the atrial flutter through his pacemaker  . Atrial fibrillation   . History of stomach ulcers     many yrs ago  . GERD (gastroesophageal reflux disease)   . History of kidney stones     per pt - no actual diagnosisi  . Cancer     prostate - radiation only  . Pacemaker 09-25-13  . Prostate cancer 09-25-13    tx. with radiation only- dx. 2013  . Diabetes mellitus without complication     "prediabetic"-no oral meds   Past Surgical History:  Past Surgical History  Procedure Laterality Date  . Pacemaker insertion      medtronic adapta- Dr. Royann Shivers follows- LOV 9'14  . Tonsillectomy    . Appendectomy    . Cardioversion N/A 02/09/2013    Procedure: CARDIOVERSION;  Surgeon: Thurmon Fair, MD;  Location: [facility];  Service: Cardiovascular;  Laterality: N/A;  . Cardiac catheterization  02/18/06    EF 65%, focal napkin ring-like lesion into the prox LAD otherwise normal  coronaries  . Total knee arthroplasty Right 10/05/2013    Procedure: RIGHT TOTAL KNEE ARTHROPLASTY;  Surgeon: Loanne Drilling, MD;  Location: [facility];  Service: Orthopedics;  Laterality: Right;    Subjective Symptoms/Limitations Symptoms: Pt is a 79 year old male referred to PT s/p Rt TKR on 10/05/13.  He is managing his pain with pain medication as needed and uses his perscription before therapy.  He is using ice to control his pain.  he is walking as much as he can.  He is walking with a  SPC. He is having difficulty stepping up, he has fear of falling outdoor.  How long can you stand comfortably?: 5-10 minutes (difficulty with shaving) How long can you walk comfortably?: with a SPC 5 minutes Patient Stated Goals: get back to normal as much as possible. Pain Assessment Currently in Pain?: Yes Pain Score: 2  Pain Location: Knee Pain Orientation: Right Pain Type: Acute pain;Surgical pain Pain Onset: More than a month ago Pain Frequency: Constant Pain Relieving Factors: tyleonol w/codine Effect of Pain on Daily Activities: difficulty walking  Balance Screening Balance Screen Has the patient fallen in the past 6 months: No Has the patient had a decrease in activity level because of a fear of falling? : Yes Is the patient reluctant to leave their home because of a fear of falling? 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, yard work  Cognition/Observation    Sensation/Coordination/Flexibility/Functional Tests Functional Tests Functional Tests: FOTO: 43/57 Functional Tests: 5 sit to stands w/o UE A from lowered mat surface: 14 seconds  Assessment RLE AROM (degrees) Right Knee Extension: 15 Right Knee Flexion: 85 RLE PROM (degrees) Right Knee Extension: 10 Right Knee Flexion: 92  Mobility/Balance   Ambulation/Gait Ambulation/Gait: Yes Assistive device: Straight cane Gait Pattern: Antalgic;Decreased hip/knee flexion - right;Right foot flat Static Standing Balance Single Leg Stance - Right Leg: 0 Single Leg Stance - Left Leg: 0 Tandem Stance - Right Leg: 0 Tandem Stance - Left Leg: 0 Rhomberg - Eyes Opened: 10 Rhomberg - Eyes Closed: 6   Exercise/Treatments Supine Quad Sets: 10 reps (5 sec holds) Heel Slides: Right;10 reps (5 sec holds) Sidelying Hip ABduction: Right;10 reps;Limitations Hip ABduction Limitations: PT faciliatin Prone  Hamstring Curl: 5 reps;5 seconds Hip Extension: 10 reps;Limitations Hip Extension Limitations: PT facilaiton Other Prone Exercises: TKE: 5x5 sec holds   Modalities Modalities: Cryotherapy Manual Therapy Manual Therapy: Other (comment) Other Manual Therapy: PROM to improve knee flexion and extension x10 minutes  Cryotherapy Number Minutes Cryotherapy: 10 Minutes Cryotherapy Location: Knee Type of Cryotherapy: Ice pack  Physical Therapy Assessment and Plan PT Assessment and Plan Clinical Impression Statement: Pt is a 79 year old male referred to PT s/p Rt TKR with impairments listed below.  At this time his most limited by his AROM which improved to 5-98 degrees after exercise and manual therapy.   Pt will benefit from skilled therapeutic intervention in order to improve on the following deficits: Abnormal gait;Difficulty walking;Pain;Impaired perceived functional ability;Decreased range of motion;Decreased mobility;Decreased strength;Decreased balance;Increased muscle spasms;Increased fascial restricitons;Increased edema;Impaired flexibility Rehab Potential: Good PT Frequency: Min 3X/week PT Duration: 8 weeks PT Treatment/Interventions: Gait training;Stair training;DME instruction;Manual techniques;Functional mobility training;Therapeutic activities;Therapeutic exercise;Balance training;Neuromuscular re-education;Patient/family  education;Modalities PT Plan: focus on improving P/AROM to knee and decrease edema.  Continue to improve knee functional strength with SAQ, LAQ, SLR, bridges, seated heel and toe raises (no squats due to pain). Progress towards bike for AROM.     Goals Home Exercise Program Pt/caregiver will Perform Home Exercise Program: Independently PT Goal: Perform Home Exercise Program - Progress: Goal set today PT Short Term Goals Time to Complete Short Term Goals: 4 weeks PT Short Term Goal 1: Pt will improve his functional Rt knee AROM 5-105 degrees in order to go from sit to stand with greater ease.  PT Short Term Goal 2: Pt will improve his hip abduction strength to 4/5 to prepare for independent gait with minimal gait impairments.  PT Short Term Goal 3: Pt will improve his dynamic balance and beign to ambulate independently w/moderate gait impairments.  PT Long Term Goals Time to Complete Long Term Goals: 8 weeks PT Long Term Goal 1: Pt will improve her FOTO limiation to less than 40% for improved QOL. PT Long Term Goal 2: Pt will improve his knee AROM 3-110 degrees in order to ambulate with minimal gait impairments independently to decrease risk of secondary injury.  Long Term Goal 3: Pt will improve his functional strength and perform 5 sit to stands for standard surface without UE A in 12 seconds for normalized age value.  Long Term Goal 4: Pt will improve his strength to [facility] in order to ascend and desecnd 5 steps with 1 handrail with alternating pattern in order  to safely enter community dwellings.   Problem List Patient Active Problem List   Diagnosis Date Noted  . Difficulty in walking(719.7) 11/03/2013  . Stiffness of right knee 11/03/2013  . Postoperative anemia due to acute blood loss 10/06/2013  . OA (osteoarthritis) of knee 10/05/2013  . Preoperative cardiovascular examination 07/20/2013  . Acute on chronic combined systolic and diastolic CHF (congestive heart failure) 04/29/2013  .  Long QT interval 04/21/2013  . Volume overload 04/21/2013  . Long term (current) use of anticoagulants 01/09/2013  . HTN (hypertension) 05/20/2011  . Pleural effusion 05/20/2011  . Lactic acid acidosis 05/18/2011  . Pacemaker 05/18/2011  . Anemia 05/18/2011  . Acute renal failure (ARF) 05/18/2011  . Pneumonia 05/17/2011  . Fever 05/17/2011  . Atrial fibrillation 05/17/2011  . Hyperglycemia 05/17/2011  . Bradycardia 05/17/2011  . Gout 05/17/2011  . Arthritis 05/17/2011  . Thrombocytopenia 05/17/2011    PT - End of Session Activity Tolerance: Patient tolerated treatment well General Behavior During Therapy: WFL for tasks assessed/performed PT Plan of Care PT Home Exercise Plan: gvien PT Patient Instructions: importance of HEP, ice and elevation for pain control.  Consulted and Agree with Plan of Care: Patient;Family member/caregiver Family Member Consulted: Son Agricultural engineer)  GP Functional Assessment Tool Used: FOTO: 43/57 Functional Limitation: Mobility: Walking and moving around Mobility: Walking and Moving Around Current Status [phone]): At least 40 percent but less than 60 percent impaired, limited or restricted Mobility: Walking and Moving Around Goal Status [phone]): At least 40 percent but less than 60 percent impaired, limited or restricted  Susie Ehresman, MPT, ATC 11/03/2013, 3:54 PM  Physician Documentation Your signature is required to indicate approval of the treatment plan as stated above.  Please sign and either send electronically or make a copy of this report for your files and return this physician signed original.   Please mark one 1.__approve of plan  2. ___approve of plan with the following conditions.   ______________________________                                                          _____________________ Physician Signature                                                                                                             Date

## 2013-11-06 ENCOUNTER — Ambulatory Visit: Payer: Medicare Other | Admitting: Pharmacist Clinician (PhC)/ Clinical Pharmacy Specialist

## 2013-11-06 ENCOUNTER — Ambulatory Visit (INDEPENDENT_AMBULATORY_CARE_PROVIDER_SITE_OTHER): Payer: Medicare Other | Admitting: Pharmacist Clinician (PhC)/ Clinical Pharmacy Specialist

## 2013-11-06 VITALS — BP 140/76 | HR 76

## 2013-11-06 DIAGNOSIS — Z7901 Long term (current) use of anticoagulants: Secondary | ICD-10-CM

## 2013-11-06 DIAGNOSIS — I4891 Unspecified atrial fibrillation: Secondary | ICD-10-CM | POA: Diagnosis not present

## 2013-11-06 DIAGNOSIS — Z5181 Encounter for therapeutic drug level monitoring: Secondary | ICD-10-CM

## 2013-11-06 LAB — POCT INR: INR: 1.9

## 2013-11-10 ENCOUNTER — Ambulatory Visit (HOSPITAL_COMMUNITY)
Admission: RE | Admit: 2013-11-10 | Discharge: 2013-11-10 | Disposition: A | Discharge status: Home / Self Care | Payer: Medicare Other | Source: Ambulatory Visit | Attending: Family Medicine | Admitting: Family Medicine

## 2013-11-10 DIAGNOSIS — R262 Difficulty in walking, not elsewhere classified: Secondary | ICD-10-CM | POA: Insufficient documentation

## 2013-11-10 DIAGNOSIS — M25569 Pain in unspecified knee: Secondary | ICD-10-CM | POA: Insufficient documentation

## 2013-11-10 DIAGNOSIS — IMO0001 Reserved for inherently not codable concepts without codable children: Secondary | ICD-10-CM | POA: Insufficient documentation

## 2013-11-10 DIAGNOSIS — M25661 Stiffness of right knee, not elsewhere classified: Secondary | ICD-10-CM

## 2013-11-10 DIAGNOSIS — I10 Essential (primary) hypertension: Secondary | ICD-10-CM | POA: Insufficient documentation

## 2013-11-10 DIAGNOSIS — M25669 Stiffness of unspecified knee, not elsewhere classified: Secondary | ICD-10-CM | POA: Diagnosis not present

## 2013-11-10 NOTE — Progress Notes (Signed)
Physical Therapy Treatment Patient Details  Name: [name] MRN: [id number] Date of Birth: [birth year]  Today's Date: 11/10/2013 Time: 1512-1607 PT Time Calculation (min): 49 min Charge:TE 1512-1555, Ice 5462-7035  Visit#: 2 of 12  Re-eval: 12/03/13 Assessment Diagnosis: R TKR  Surgical Date: 10/05/13 Next MD Visit: Dr. Alusio1/8/15  Authorization: Medicare  Authorization Time Period:    Authorization Visit#: 2 of 10   Subjective: Symptoms/Limitations Symptoms: Pt reported pain increases when sitting or standing still, stated walking feels good on knee.  Compliant with HEP. Pain Assessment Currently in Pain?: Yes Pain Score: 2  Pain Location: Knee Pain Orientation: Right  Objective:   Exercise/Treatments Stretches Active Hamstring Stretch: 3 reps;30 seconds;Limitations Active Hamstring Stretch Limitations: with rope Quad Stretch: 3 reps;30 seconds Aerobic Stationary Bike: 8' no resistance for ROM at seat 11, originally rocking then ablet to make full revolution Seated Long Arc Quad: 15 reps Other Seated Knee Exercises: 5 STS no HHA from low mat height Supine Quad Sets: 15 reps;Limitations Quad Sets Limitations: 5 sec holds Short Arc [employer] Sets: Right;2 sets;10 reps;Limitations Technical brewer Limitations: 2nd set with 2# Heel Slides: Right;10 reps;Limitations Heel Slides Limitations: 5 sec holds Straight Leg Raises: Right;15 reps Knee Extension: PROM Knee Flexion: PROM Sidelying Hip ABduction: Right;15 reps;Limitations Hip ABduction Limitations: PT faciliatin Prone  Hamstring Curl: 15 reps;5 seconds;Limitations Hamstring Curl Limitations: 2# Hip Extension: 15 reps;Limitations Hip Extension Limitations: PT facilaiton   Modalities Modalities: Cryotherapy Manual Therapy Manual Therapy: Other (comment) Other Manual Therapy: PROM to improve knee flexion and extension Cryotherapy Number Minutes Cryotherapy: 10 Minutes Cryotherapy Location:  Knee Type of Cryotherapy: Ice pack  Physical Therapy Assessment and Plan PT Assessment and Plan Clinical Impression Statement: Began POC for Rt knee strengthening and ROM.  Pt able to demonstrate appropriate technique with exercises with min PT facilitation for correct form and technique.  Able to add 2# with SAQ and hamstring curls.  Pt tolerated well towards PROM with flexion of 103.  Pt able to make full revoluation on bicycle at end of session seat 11.  Ended with ice for pain control.  Pt reported pain scale same as initial. PT Plan: focus on improving P/AROM to knee and decrease edema.  Continue to improve knee functional strength with SAQ, LAQ, SLR, bridges, seated heel and toe raises (no squats due to pain).     Goals PT Short Term Goals PT Short Term Goal 1 - Progress: Progressing toward goal PT Short Term Goal 2 - Progress: Progressing toward goal PT Long Term Goals PT Long Term Goal 2 - Progress: Progressing toward goal Long Term Goal 3 Progress: Progressing toward goal  Problem List Patient Active Problem List   Diagnosis Date Noted  . Difficulty in walking(719.7) 11/03/2013  . Stiffness of right knee 11/03/2013  . Postoperative anemia due to acute blood loss 10/06/2013  . OA (osteoarthritis) of knee 10/05/2013  . Preoperative cardiovascular examination 07/20/2013  . Acute on chronic combined systolic and diastolic CHF (congestive heart failure) 04/29/2013  . Long QT interval 04/21/2013  . Volume overload 04/21/2013  . Long term (current) use of anticoagulants 01/09/2013  . HTN (hypertension) 05/20/2011  . Pleural effusion 05/20/2011  . Lactic acid acidosis 05/18/2011  . Pacemaker 05/18/2011  . Anemia 05/18/2011  . Acute renal failure (ARF) 05/18/2011  . Pneumonia 05/17/2011  . Fever 05/17/2011  . Atrial fibrillation 05/17/2011  . Hyperglycemia 05/17/2011  . Bradycardia 05/17/2011  . Gout 05/17/2011  . Arthritis 05/17/2011  . Thrombocytopenia  05/17/2011    PT -  End of Session Activity Tolerance: Patient tolerated treatment well General Behavior During Therapy: [facility] for tasks assessed/performed  GP    Aldona Lento 11/10/2013, 4:06 PM

## 2013-11-12 ENCOUNTER — Ambulatory Visit (HOSPITAL_COMMUNITY)
Admission: RE | Admit: 2013-11-12 | Discharge: 2013-11-12 | Disposition: A | Discharge status: Home / Self Care | Payer: Medicare Other | Source: Ambulatory Visit | Attending: Family Medicine | Admitting: Family Medicine

## 2013-11-12 DIAGNOSIS — R262 Difficulty in walking, not elsewhere classified: Secondary | ICD-10-CM

## 2013-11-12 DIAGNOSIS — M25661 Stiffness of right knee, not elsewhere classified: Secondary | ICD-10-CM

## 2013-11-12 DIAGNOSIS — Z96659 Presence of unspecified artificial knee joint: Secondary | ICD-10-CM | POA: Diagnosis not present

## 2013-11-12 DIAGNOSIS — Z471 Aftercare following joint replacement surgery: Secondary | ICD-10-CM | POA: Diagnosis not present

## 2013-11-12 DIAGNOSIS — M1710 Unilateral primary osteoarthritis, unspecified knee: Secondary | ICD-10-CM | POA: Diagnosis not present

## 2013-11-12 NOTE — Progress Notes (Signed)
Physical Therapy Treatment Patient Details  Name: [name] MRN: [id number] Date of Birth: 02-04-1934  Today's Date: 11/12/2013 Time: 2951-8841 PT Time Calculation (min): 83 min Charge: TE 6606-3016  Visit#: 3 of 12  Re-eval: 12/03/13 Assessment Diagnosis: R TKR  Surgical Date: 10/05/13 Next MD Visit: Dr. Alusio1/8/15  Authorization: Medicare  Authorization Time Period:    Authorization Visit#: 3 of 10   Subjective: Symptoms/Limitations Symptoms: Pt reports minimal pain today.  Pt stated MD apt this morning, MD happy with progress.  Able to achieve 100 degrees flexion.   Pain Assessment Currently in Pain?: Yes Pain Score: 2  Pain Location: Knee Pain Orientation: Right  Objective:   Exercise/Treatments Stretches Active Hamstring Stretch: 3 reps;30 seconds;Limitations Active Hamstring Stretch Limitations: with rope Quad Stretch: 3 reps;30 seconds Gastroc Stretch: 3 reps;30 seconds;Limitations Gastroc Stretch Limitations: slant board Aerobic Stationary Bike: 10' no resistance for ROM at seat 11, originally rocking then able to make full revolution Standing Heel Raises: 10 reps;Limitations Heel Raises Limitations: Toe raises Knee Flexion: 15 reps Terminal Knee Extension: Right;10 reps;Theraband Theraband Level (Terminal Knee Extension): Level 4 (Blue) Functional Squat: 10 reps Supine Quad Sets: 15 reps;Limitations Short Arc Quad Sets: Right;15 reps Heel Slides: Right;10 reps;Limitations Heel Slides Limitations: 5 sec holds Terminal Knee Extension: Right;10 reps Knee Extension: PROM Knee Flexion: PROM Prone  Hamstring Curl: 15 reps;5 seconds;Limitations Hamstring Curl Limitations: 3# Hip Extension: 15 reps;Limitations Hip Extension Limitations: PT facilaiton Other Prone Exercises: TKE: 10x5 sec holds      Physical Therapy Assessment and Plan PT Assessment and Plan Clinical Impression Statement: Ival Bible, PTA began session with bike for ROM.   Progressed to standing closed chain exercises with min cueing required for form.  Began TKE in standing, prone and supine to improve knee extension.  Pt able to achieve AROM 4-100 this session.  Pt educated on proper form and time frame for effective musclature lengthening.  Pt reported pain scale 2/10 at end of session.  Pt encouraged to apply ice with elevation at home for pain and edema control.   PT Plan: focus on improving P/AROM to knee and decrease edema.  Continue to improve knee functional strength.    Goals Home Exercise Program Pt/caregiver will Perform Home Exercise Program: Independently PT Short Term Goals Time to Complete Short Term Goals: 4 weeks PT Short Term Goal 1: Pt will improve his functional Rt knee AROM 5-105 degrees in order to go from sit to stand with greater ease.  PT Short Term Goal 1 - Progress: Progressing toward goal PT Short Term Goal 2: Pt will improve his hip abduction strength to 4/5 to prepare for independent gait with minimal gait impairments.  PT Short Term Goal 3: Pt will improve his dynamic balance and beign to ambulate independently w/moderate gait impairments.  PT Long Term Goals Time to Complete Long Term Goals: 8 weeks PT Long Term Goal 1: Pt will improve her FOTO limiation to less than 40% for improved QOL. PT Long Term Goal 2: Pt will improve his knee AROM 3-110 degrees in order to ambulate with minimal gait impairments independently to decrease risk of secondary injury.  Long Term Goal 3: Pt will improve his functional strength and perform 5 sit to stands for standard surface without UE A in 12 seconds for normalized age value.  Long Term Goal 3 Progress: Progressing toward goal Long Term Goal 4: Pt will improve his strength to [facility] in order to ascend and desecnd 5 steps with 1 handrail  with alternating pattern in order to safely enter community dwellings.   Problem List Patient Active Problem List   Diagnosis Date Noted  . Difficulty in  walking(719.7) 11/03/2013  . Stiffness of right knee 11/03/2013  . Postoperative anemia due to acute blood loss 10/06/2013  . OA (osteoarthritis) of knee 10/05/2013  . Preoperative cardiovascular examination 07/20/2013  . Acute on chronic combined systolic and diastolic CHF (congestive heart failure) 04/29/2013  . Long QT interval 04/21/2013  . Volume overload 04/21/2013  . Long term (current) use of anticoagulants 01/09/2013  . HTN (hypertension) 05/20/2011  . Pleural effusion 05/20/2011  . Lactic acid acidosis 05/18/2011  . Pacemaker 05/18/2011  . Anemia 05/18/2011  . Acute renal failure (ARF) 05/18/2011  . Pneumonia 05/17/2011  . Fever 05/17/2011  . Atrial fibrillation 05/17/2011  . Hyperglycemia 05/17/2011  . Bradycardia 05/17/2011  . Gout 05/17/2011  . Arthritis 05/17/2011  . Thrombocytopenia 05/17/2011    PT - End of Session Activity Tolerance: Patient tolerated treatment well General Behavior During Therapy: [facility] for tasks assessed/performed  GP    Aldona Lento 11/12/2013, 3:56 PM

## 2013-11-16 ENCOUNTER — Ambulatory Visit (HOSPITAL_COMMUNITY)
Admission: RE | Admit: 2013-11-16 | Discharge: 2013-11-16 | Disposition: A | Discharge status: Home / Self Care | Payer: Medicare Other | Source: Ambulatory Visit | Attending: Family Medicine | Admitting: Family Medicine

## 2013-11-16 DIAGNOSIS — M25661 Stiffness of right knee, not elsewhere classified: Secondary | ICD-10-CM

## 2013-11-16 DIAGNOSIS — R262 Difficulty in walking, not elsewhere classified: Secondary | ICD-10-CM

## 2013-11-16 NOTE — Progress Notes (Signed)
Physical Therapy Treatment Patient Details  Name: [name] MRN: [id number] Date of Birth: 1934-02-05  Today's Date: 11/16/2013 Time: 1324-4010 PT Time Calculation (min): 60 min Charge ;Manual 1433-1500, TE 2725-3664, Ice 4034-7425  Visit#: 4 of 12  Re-eval: 12/03/13 Assessment Diagnosis: R TKR  Surgical Date: 10/05/13 Next MD Visit: Dr. Maureen Ralphs 12/17/2013  Authorization: Medicare  Authorization Time Period:    Authorization Visit#: 4 of 10   Subjective: Symptoms/Limitations Symptoms: Pt stated increased pain over weeekend following riding bike on Friday.  Pain scale 2/10 with pain meds prior therapy today.  Pt reports completeing stretches with his belt at home following last session. Pain Assessment Currently in Pain?: Yes Pain Score: 2  Pain Location: Knee Pain Orientation: Right  Objective:   Exercise/Treatments Stretches Active Hamstring Stretch: 3 reps;30 seconds;Limitations Active Hamstring Stretch Limitations: with rope Quad Stretch: 3 reps;30 seconds ITB Stretch: 3 reps;30 seconds Aerobic Stationary Bike: 6' no resistance for ROM at seat 11, originally rocking then able to make full revolution Supine Quad Sets: 15 reps;Limitations Short [employer] Sets: Right;15 reps Technical brewer Limitations: 3# Heel Slides: Right;10 reps;Limitations Heel Slides Limitations: 5 sec holds Knee Extension: PROM Knee Flexion: PROM Prone  Hamstring Curl: 15 reps;5 seconds;Limitations Hamstring Curl Limitations: 3# Other Prone Exercises: TKE: 10x5 sec holds   Modalities Modalities: Cryotherapy Manual Therapy Manual Therapy: Myofascial release Myofascial Release: MFR to anterior knee to reduce fascial restrictions with PROM for flexion and extension at end Cryotherapy Number Minutes Cryotherapy: 10 Minutes Cryotherapy Location: Knee Type of Cryotherapy: Ice pack  Physical Therapy Assessment and Plan PT Assessment and Plan Clinical Impression Statement: Session  focus on manual techniques to reduce fascial restrictions to improve ROM and reduce pain.  Pt with improve flexiblity following manual and stretches with increased AROM following.  AROM 2-103, PROM 0-105.  Ended session with ice for pain and edema control.  Pt reported pain reduced at end of session.   PT Plan: focus on improving P/AROM to knee and decrease edema.  Continue to improve knee functional strength.    Goals    Problem List Patient Active Problem List   Diagnosis Date Noted  . Difficulty in walking(719.7) 11/03/2013  . Stiffness of right knee 11/03/2013  . Postoperative anemia due to acute blood loss 10/06/2013  . OA (osteoarthritis) of knee 10/05/2013  . Preoperative cardiovascular examination 07/20/2013  . Acute on chronic combined systolic and diastolic CHF (congestive heart failure) 04/29/2013  . Long QT interval 04/21/2013  . Volume overload 04/21/2013  . Long term (current) use of anticoagulants 01/09/2013  . HTN (hypertension) 05/20/2011  . Pleural effusion 05/20/2011  . Lactic acid acidosis 05/18/2011  . Pacemaker 05/18/2011  . Anemia 05/18/2011  . Acute renal failure (ARF) 05/18/2011  . Pneumonia 05/17/2011  . Fever 05/17/2011  . Atrial fibrillation 05/17/2011  . Hyperglycemia 05/17/2011  . Bradycardia 05/17/2011  . Gout 05/17/2011  . Arthritis 05/17/2011  . Thrombocytopenia 05/17/2011    PT - End of Session Activity Tolerance: Patient tolerated treatment well General Behavior During Therapy: [facility] for tasks assessed/performed  GP    Aldona Lento 11/16/2013, 4:54 PM

## 2013-11-18 ENCOUNTER — Ambulatory Visit (HOSPITAL_COMMUNITY)
Admission: RE | Admit: 2013-11-18 | Discharge: 2013-11-18 | Disposition: A | Discharge status: Home / Self Care | Payer: Medicare Other | Source: Ambulatory Visit | Attending: Family Medicine | Admitting: Family Medicine

## 2013-11-18 DIAGNOSIS — R262 Difficulty in walking, not elsewhere classified: Secondary | ICD-10-CM

## 2013-11-18 DIAGNOSIS — M25661 Stiffness of right knee, not elsewhere classified: Secondary | ICD-10-CM

## 2013-11-18 NOTE — Progress Notes (Signed)
Physical Therapy Treatment Patient Details  Name: [name] MRN: [id number] Date of Birth: Mar 03, 1934  Today's Date: 11/18/2013 Time: 7829-5621 PT Time Calculation (min): 31 min Charge: Manual 1440-1455, TE 1455-1520, Ice 1520-1530  Visit#: 5 of 12  Re-eval: 12/03/13 Assessment Diagnosis: R TKR  Surgical Date: 10/05/13 Next MD Visit: Dr. Maureen Ralphs 12/17/2013  Authorization: Medicare  Authorization Time Period:    Authorization Visit#: 5 of 10   Subjective: Symptoms/Limitations Symptoms: Pt reported going for a walk yesterday for the first time since November for 15 minutes.  Current pain 1/10. Pain Assessment Currently in Pain?: Yes Pain Score: 1  Pain Location: Knee Pain Orientation: Right  Objective:   Exercise/Treatments Supine Quad Sets: 15 reps;Limitations Quad Sets Limitations: 5" holds Short [employer] Sets: Right;15 reps Technical brewer Limitations: 3# Heel Slides: Right;10 reps;Limitations Heel Slides Limitations: 5 sec holds Terminal Knee Extension: Right;15 reps;Limitations Terminal Knee Extension Limitations: 5" holds Knee Extension: PROM Knee Flexion: PROM Sidelying Hip ABduction: Right;15 reps;Limitations Hip ABduction Limitations: PT faciliatin Prone  Hip Extension: 15 reps;Limitations Hip Extension Limitations: PT facilaiton Prone Knee Hang: 3 minutes Other Prone Exercises: TKE: 15x5 sec holds   Modalities Modalities: Cryotherapy Manual Therapy Manual Therapy: Myofascial release Myofascial Release: MFR to anterior knee to reduce fascial restrictions with PROM for flexion and extension at end Cryotherapy Number Minutes Cryotherapy: 10 Minutes Cryotherapy Location: Knee Type of Cryotherapy: Ice pack  Physical Therapy Assessment and Plan PT Assessment and Plan Clinical Impression Statement: Session focus on improvijg ROM.  Completed manual techniques to reduce fascial restriction and PROM with PROM 0-107.  Edema reduced significantly  this session.  Instructed prone knee hang to imporve knee extension and began supine TKE for quad strengthening. PT Plan: focus on improving P/AROM to knee and decrease edema.  Continue to improve knee functional strength.    Goals Home Exercise Program Pt/caregiver will Perform Home Exercise Program: Independently PT Short Term Goals Time to Complete Short Term Goals: 4 weeks PT Short Term Goal 1: Pt will improve his functional Rt knee AROM 5-105 degrees in order to go from sit to stand with greater ease.  PT Short Term Goal 1 - Progress: Progressing toward goal PT Short Term Goal 2: Pt will improve his hip abduction strength to 4/5 to prepare for independent gait with minimal gait impairments.  PT Short Term Goal 2 - Progress: Progressing toward goal PT Short Term Goal 3: Pt will improve his dynamic balance and beign to ambulate independently w/moderate gait impairments.  PT Long Term Goals Time to Complete Long Term Goals: 8 weeks PT Long Term Goal 1: Pt will improve her FOTO limiation to less than 40% for improved QOL. PT Long Term Goal 2: Pt will improve his knee AROM 3-110 degrees in order to ambulate with minimal gait impairments independently to decrease risk of secondary injury.  Long Term Goal 3: Pt will improve his functional strength and perform 5 sit to stands for standard surface without UE A in 12 seconds for normalized age value.  Long Term Goal 4: Pt will improve his strength to [facility] in order to ascend and desecnd 5 steps with 1 handrail with alternating pattern in order to safely enter community dwellings.   Problem List Patient Active Problem List   Diagnosis Date Noted  . Difficulty in walking(719.7) 11/03/2013  . Stiffness of right knee 11/03/2013  . Postoperative anemia due to acute blood loss 10/06/2013  . OA (osteoarthritis) of knee 10/05/2013  . Preoperative cardiovascular  examination 07/20/2013  . Acute on chronic combined systolic and diastolic CHF (congestive  heart failure) 04/29/2013  . Long QT interval 04/21/2013  . Volume overload 04/21/2013  . Long term (current) use of anticoagulants 01/09/2013  . HTN (hypertension) 05/20/2011  . Pleural effusion 05/20/2011  . Lactic acid acidosis 05/18/2011  . Pacemaker 05/18/2011  . Anemia 05/18/2011  . Acute renal failure (ARF) 05/18/2011  . Pneumonia 05/17/2011  . Fever 05/17/2011  . Atrial fibrillation 05/17/2011  . Hyperglycemia 05/17/2011  . Bradycardia 05/17/2011  . Gout 05/17/2011  . Arthritis 05/17/2011  . Thrombocytopenia 05/17/2011    PT - End of Session Activity Tolerance: Patient tolerated treatment well General Behavior During Therapy: [facility] for tasks assessed/performed  GP    Aldona Lento 11/18/2013, 4:09 PM

## 2013-11-20 ENCOUNTER — Ambulatory Visit (HOSPITAL_COMMUNITY)
Admission: RE | Admit: 2013-11-20 | Discharge: 2013-11-20 | Disposition: A | Discharge status: Home / Self Care | Payer: Medicare Other | Source: Ambulatory Visit | Attending: Family Medicine | Admitting: Family Medicine

## 2013-11-20 DIAGNOSIS — J069 Acute upper respiratory infection, unspecified: Secondary | ICD-10-CM | POA: Diagnosis not present

## 2013-11-20 DIAGNOSIS — B9789 Other viral agents as the cause of diseases classified elsewhere: Secondary | ICD-10-CM | POA: Diagnosis not present

## 2013-11-20 DIAGNOSIS — R262 Difficulty in walking, not elsewhere classified: Secondary | ICD-10-CM

## 2013-11-20 DIAGNOSIS — Z6828 Body mass index (BMI) 28.0-28.9, adult: Secondary | ICD-10-CM | POA: Diagnosis not present

## 2013-11-20 DIAGNOSIS — M25661 Stiffness of right knee, not elsewhere classified: Secondary | ICD-10-CM

## 2013-11-20 NOTE — Progress Notes (Addendum)
Physical Therapy Treatment Patient Details  Name: [name] MRN: [id number] Date of Birth: 1933/12/21  Today's Date: 11/20/2013 Time: 1430-1530 PT Time Calculation (min): 64 min Charge:TE 1430-1500, 1512-1518, Manual 1500-1512, Ice 1520-1530  Visit#: 6 of 12  Re-eval: 12/03/13 Assessment Diagnosis: R TKR  Surgical Date: 10/05/13 Next MD Visit: Dr. Maureen Ralphs 12/17/2013  Authorization: Medicare  Authorization Time Period:    Authorization Visit#: 6 of 10   Subjective: Symptoms/Limitations Symptoms: Pt reported he liked the new prone knee hang exercise, able to complete 15 minutes at home.  Compliance with HEP.  Current pain scale 1/10.   Pain Assessment Currently in Pain?: Yes Pain Score: 1  Pain Location: Knee Pain Orientation: Right  Objective:   Exercise/Treatments Aerobic 6' no resistance for ROM at seat 9,  full revolution Stretches Gastroc Stretch: 3 reps;30 seconds;Limitations Gastroc Stretch Limitations: slant board Standing Heel Raises: 15 reps;Limitations Heel Raises Limitations: Toe raises Knee Flexion: 15 reps Terminal Knee Extension: Right;10 reps;Theraband Theraband Level (Terminal Knee Extension): Level 4 (Blue) Lateral Step Up: Right;10 reps;Hand Hold: 1;Step Height: 4" Forward Step Up: Right;10 reps;Hand Hold: 1;Step Height: 4" Functional Squat: 10 reps Rocker Board: 2 minutes;Limitations Rocker Board Limitations: R/L and A/P Supine Quad Sets: 15 reps;Limitations Heel Slides: Right;10 reps;Limitations Heel Slides Limitations: 5 sec holds Terminal Knee Extension: Right;15 reps;Limitations Terminal Knee Extension Limitations: 5" holds Knee Extension: PROM Knee Flexion: PROM  Modalities Modalities: Cryotherapy Manual Therapy Manual Therapy: Myofascial release Myofascial Release: MFR to anterior knee to reduce fascial restrictions with PROM for flexion and extension at end Cryotherapy Number Minutes Cryotherapy: 10 Minutes Cryotherapy  Location: Knee Type of Cryotherapy: Ice pack  Physical Therapy Assessment and Plan PT Assessment and Plan Clinical Impression Statement: Progressed closed chain kinetic exercises for LE strengthening and to improve ROM.  Pt improved quadriceps coordinaiton with improve AROM to 3-102 today.  Manual techniques complete to reduce fascial restrictions with PROM 0-110.  Ended session with ice for pain and edema control.   PT Plan: Focus on improving quad strengthening for improved AROM, manual techniques to reduce fascial restictions with PROM, continues to improve functional strength for knee and gluteal strength for improved gait mechanics.      Goals Home Exercise Program Pt/caregiver will Perform Home Exercise Program: Independently PT Short Term Goals Time to Complete Short Term Goals: 4 weeks PT Short Term Goal 1: Pt will improve his functional Rt knee AROM 5-105 degrees in order to go from sit to stand with greater ease.  PT Short Term Goal 1 - Progress: Progressing toward goal (AROM 3-102 degrees) PT Short Term Goal 2: Pt will improve his hip abduction strength to 4/5 to prepare for independent gait with minimal gait impairments.  PT Short Term Goal 3: Pt will improve his dynamic balance and beign to ambulate independently w/moderate gait impairments.  PT Long Term Goals Time to Complete Long Term Goals: 8 weeks PT Long Term Goal 1: Pt will improve her FOTO limiation to less than 40% for improved QOL. PT Long Term Goal 2: Pt will improve his knee AROM 3-110 degrees in order to ambulate with minimal gait impairments independently to decrease risk of secondary injury.  PT Long Term Goal 2 - Progress: Progressing toward goal Long Term Goal 3: Pt will improve his functional strength and perform 5 sit to stands for standard surface without UE A in 12 seconds for normalized age value.  Long Term Goal 3 Progress: Progressing toward goal Long Term Goal 4: Pt will improve  his strength to [facility] in  order to ascend and desecnd 5 steps with 1 handrail with alternating pattern in order to safely enter community dwellings.  Long Term Goal 4 Progress: Progressing toward goal  Problem List Patient Active Problem List   Diagnosis Date Noted  . Difficulty in walking(719.7) 11/03/2013  . Stiffness of right knee 11/03/2013  . Postoperative anemia due to acute blood loss 10/06/2013  . OA (osteoarthritis) of knee 10/05/2013  . Preoperative cardiovascular examination 07/20/2013  . Acute on chronic combined systolic and diastolic CHF (congestive heart failure) 04/29/2013  . Long QT interval 04/21/2013  . Volume overload 04/21/2013  . Long term (current) use of anticoagulants 01/09/2013  . HTN (hypertension) 05/20/2011  . Pleural effusion 05/20/2011  . Lactic acid acidosis 05/18/2011  . Pacemaker 05/18/2011  . Anemia 05/18/2011  . Acute renal failure (ARF) 05/18/2011  . Pneumonia 05/17/2011  . Fever 05/17/2011  . Atrial fibrillation 05/17/2011  . Hyperglycemia 05/17/2011  . Bradycardia 05/17/2011  . Gout 05/17/2011  . Arthritis 05/17/2011  . Thrombocytopenia 05/17/2011    PT - End of Session Activity Tolerance: Patient tolerated treatment well General Behavior During Therapy: [facility] for tasks assessed/performed  GP    Aldona Lento 11/20/2013, 3:19 PM

## 2013-11-23 ENCOUNTER — Ambulatory Visit (HOSPITAL_COMMUNITY)
Admission: RE | Admit: 2013-11-23 | Discharge: 2013-11-23 | Disposition: A | Discharge status: Home / Self Care | Payer: Medicare Other | Source: Ambulatory Visit | Attending: Family Medicine | Admitting: Family Medicine

## 2013-11-23 DIAGNOSIS — R262 Difficulty in walking, not elsewhere classified: Secondary | ICD-10-CM

## 2013-11-23 DIAGNOSIS — M25661 Stiffness of right knee, not elsewhere classified: Secondary | ICD-10-CM

## 2013-11-23 NOTE — Progress Notes (Addendum)
Physical Therapy Treatment Patient Details  Name: [name] MRN: [id number] Date of Birth: 12-28-1933  Today's Date: 11/23/2013 Time: 6433-2951 PT Time Calculation (min): 42 min Charge TE 8841-6606, [place], Manual [phone], Ice [id number]  Visit#: 7 of 12  Re-eval: 12/03/13 Assessment Diagnosis: R TKR  Surgical Date: 10/05/13 Next MD Visit: Dr. Maureen Ralphs 12/17/2013  Authorization: Medicare  Authorization Time Period:    Authorization Visit#: 7 of 10   Subjective: Symptoms/Limitations Symptoms: Pt stated decreased soreness following last session, does feel like he is getting stronger slowly.   Pain Assessment Currently in Pain?: Yes Pain Score: 1  Pain Location: Knee Pain Orientation: Right  Objective:   Exercise/Treatments Aerobic Reciprocal bike: 6' seat 8 for ROM, full revolution.    Standing Heel Raises: 15 reps;Limitations Heel Raises Limitations: Toe raises Knee Flexion: 15 reps Terminal Knee Extension: Right;15 reps;Theraband Theraband Level (Terminal Knee Extension): Level 4 (Blue) Lateral Step Up: Right;15 reps;Hand Hold: 1;Step Height: 4" Forward Step Up: Right;15 reps;Hand Hold: 1;Step Height: 6" Step Down: Right;10 reps;Hand Hold: 1;Step Height: 4" Functional Squat: 15 reps;Limitations Functional Squat Limitations: proper lifting red weighted ball from 12 in Rocker Board: 2 minutes;Limitations Rocker Board Limitations: R/L and A/P, Stab SLS: R15", L 6" max of 3 Other Standing Knee Exercises: Tandem stance 2x 30", Tandem, retro and sidestepping 1RT Supine: Quad sets 10x 10" TKR 10x 10" Heel slides 10x\ PROM for extension and flexion   Modalities Modalities: Cryotherapy Manual Therapy Manual Therapy: Myofascial release Myofascial Release: MFR to anterior knee to reduce fascial restrictions with PROM for flexion and extension at end Cryotherapy Number Minutes Cryotherapy: 10 Minutes Cryotherapy Location: Knee Type of Cryotherapy: Ice  pack  Physical Therapy Assessment and Plan PT Assessment and Plan Clinical Impression Statement: Progressed closed chain kinetic exercises for LE strenghening and began balance training.  Began step down for eccetric quadricep strengthneing.  Pt improving AROM 2-104 today PROM 0-112 following manual techniques to reduce fascial restrictions.  Min assistance required with balance activites, vc-ing to improve spatial awareness.  Ended session with ice for edema and pain control.   PT Plan: Focus on improving quad strengthening for improved AROM, manual techniques to reduce fascial restictions with PROM, continues to improve functional strength for knee and gluteal strength for improved gait mechanics.      Goals Home Exercise Program Pt/caregiver will Perform Home Exercise Program: Independently PT Short Term Goals Time to Complete Short Term Goals: 4 weeks PT Short Term Goal 1: Pt will improve his functional Rt knee AROM 5-105 degrees in order to go from sit to stand with greater ease.  PT Short Term Goal 1 - Progress: Progressing toward goal PT Short Term Goal 2: Pt will improve his hip abduction strength to 4/5 to prepare for independent gait with minimal gait impairments.  PT Short Term Goal 2 - Progress: Progressing toward goal PT Short Term Goal 3: Pt will improve his dynamic balance and beign to ambulate independently w/moderate gait impairments.  PT Short Term Goal 3 - Progress: Progressing toward goal PT Long Term Goals Time to Complete Long Term Goals: 8 weeks PT Long Term Goal 1: Pt will improve her FOTO limiation to less than 40% for improved QOL. PT Long Term Goal 2: Pt will improve his knee AROM 3-110 degrees in order to ambulate with minimal gait impairments independently to decrease risk of secondary injury.  Long Term Goal 3: Pt will improve his functional strength and perform 5 sit to stands for standard surface without  UE A in 12 seconds for normalized age value.  Long Term  Goal 4: Pt will improve his strength to [facility] in order to ascend and desecnd 5 steps with 1 handrail with alternating pattern in order to safely enter community dwellings.  Long Term Goal 4 Progress: Progressing toward goal  Problem List Patient Active Problem List   Diagnosis Date Noted  . Difficulty in walking(719.7) 11/03/2013  . Stiffness of right knee 11/03/2013  . Postoperative anemia due to acute blood loss 10/06/2013  . OA (osteoarthritis) of knee 10/05/2013  . Preoperative cardiovascular examination 07/20/2013  . Acute on chronic combined systolic and diastolic CHF (congestive heart failure) 04/29/2013  . Long QT interval 04/21/2013  . Volume overload 04/21/2013  . Long term (current) use of anticoagulants 01/09/2013  . HTN (hypertension) 05/20/2011  . Pleural effusion 05/20/2011  . Lactic acid acidosis 05/18/2011  . Pacemaker 05/18/2011  . Anemia 05/18/2011  . Acute renal failure (ARF) 05/18/2011  . Pneumonia 05/17/2011  . Fever 05/17/2011  . Atrial fibrillation 05/17/2011  . Hyperglycemia 05/17/2011  . Bradycardia 05/17/2011  . Gout 05/17/2011  . Arthritis 05/17/2011  . Thrombocytopenia 05/17/2011    PT - End of Session Activity Tolerance: Patient tolerated treatment well General Behavior During Therapy: [facility] for tasks assessed/performed  GP Functional Assessment Tool Used:    Aldona Lento 11/23/2013, 3:51 PM

## 2013-11-25 ENCOUNTER — Ambulatory Visit (HOSPITAL_COMMUNITY)
Admission: RE | Admit: 2013-11-25 | Discharge: 2013-11-25 | Disposition: A | Discharge status: Home / Self Care | Payer: Medicare Other | Source: Ambulatory Visit | Attending: Family Medicine | Admitting: Family Medicine

## 2013-11-25 DIAGNOSIS — M25661 Stiffness of right knee, not elsewhere classified: Secondary | ICD-10-CM

## 2013-11-25 DIAGNOSIS — R262 Difficulty in walking, not elsewhere classified: Secondary | ICD-10-CM

## 2013-11-25 NOTE — Progress Notes (Signed)
Physical Therapy Treatment Patient Details  Name: [name] MRN: [id number] Date of Birth: 1934/07/02  Today's Date: 11/25/2013 Time: 3086-5784 PT Time Calculation (min): 38 min Charges:  Manual: [id number] TE: 4132-4401  Visit#: 8 of 12  Re-eval: 12/03/13    Authorization: Medicare  Authorization Time Period:    Authorization Visit#: 8 of 10   Subjective: Symptoms/Limitations Symptoms: Pt reports that he is doing well overall.  Has had some soreness.   Precautions/Restrictions     Exercise/Treatments Aerobic Stationary Bike: 8' no resistance for ROM at seat 8,  full revolution Standing Knee Flexion: AROM;15 reps;Limitations Knee Flexion Limitations: 4# Lateral Step Up: Right;15 reps;Hand Hold: 1;Step Height: 6" Forward Step Up: Right;15 reps;Hand Hold: 1;Step Height: 6"  Manual Therapy Manual Therapy: Myofascial release Joint Mobilization: Patellar mobilizations in all directions, focus on superior/inferior mobs with soft tissue massage to scar tissue.  Myofascial Release: MFR to anterior knee to reduce fascial restrictions with PROM for flexion and extension at end  Physical Therapy Assessment and Plan PT Assessment and Plan Clinical Impression Statement: Continued with manual therapy at beginning of session.  Continues to have greatest limitation to significant scar tissue to his knee limiting his knee flexion. OVerall flexion has improved functionally.  PT Plan: Focus on improving quad strengthening for improved AROM, manual techniques to reduce fascial restictions with PROM, continues to improve functional strength for knee and gluteal strength for improved gait mechanics.      Goals Home Exercise Program Pt/caregiver will Perform Home Exercise Program: Independently PT Goal: Perform Home Exercise Program - Progress: Met PT Short Term Goals Time to Complete Short Term Goals: 4 weeks PT Short Term Goal 1: Pt will improve his functional Rt knee AROM 5-105  degrees in order to go from sit to stand with greater ease.  PT Short Term Goal 1 - Progress: Progressing toward goal PT Short Term Goal 2: Pt will improve his hip abduction strength to 4/5 to prepare for independent gait with minimal gait impairments.  PT Short Term Goal 2 - Progress: Progressing toward goal PT Short Term Goal 3: Pt will improve his dynamic balance and beign to ambulate independently w/moderate gait impairments.  PT Short Term Goal 3 - Progress: Progressing toward goal PT Long Term Goals Time to Complete Long Term Goals: 8 weeks PT Long Term Goal 1: Pt will improve her FOTO limiation to less than 40% for improved QOL. PT Long Term Goal 1 - Progress: Progressing toward goal PT Long Term Goal 2: Pt will improve his knee AROM 3-110 degrees in order to ambulate with minimal gait impairments independently to decrease risk of secondary injury.  PT Long Term Goal 2 - Progress: Progressing toward goal Long Term Goal 3: Pt will improve his functional strength and perform 5 sit to stands for standard surface without UE A in 12 seconds for normalized age value.  Long Term Goal 3 Progress: Progressing toward goal Long Term Goal 4: Pt will improve his strength to [facility] in order to ascend and desecnd 5 steps with 1 handrail with alternating pattern in order to safely enter community dwellings.  Long Term Goal 4 Progress: Progressing toward goal  Problem List Patient Active Problem List   Diagnosis Date Noted  . Difficulty in walking(719.7) 11/03/2013  . Stiffness of right knee 11/03/2013  . Postoperative anemia due to acute blood loss 10/06/2013  . OA (osteoarthritis) of knee 10/05/2013  . Preoperative cardiovascular examination 07/20/2013  . Acute on chronic combined systolic and diastolic  CHF (congestive heart failure) 04/29/2013  . Long QT interval 04/21/2013  . Volume overload 04/21/2013  . Long term (current) use of anticoagulants 01/09/2013  . HTN (hypertension) 05/20/2011  .  Pleural effusion 05/20/2011  . Lactic acid acidosis 05/18/2011  . Pacemaker 05/18/2011  . Anemia 05/18/2011  . Acute renal failure (ARF) 05/18/2011  . Pneumonia 05/17/2011  . Fever 05/17/2011  . Atrial fibrillation 05/17/2011  . Hyperglycemia 05/17/2011  . Bradycardia 05/17/2011  . Gout 05/17/2011  . Arthritis 05/17/2011  . Thrombocytopenia 05/17/2011    PT - End of Session Activity Tolerance: Patient tolerated treatment well General Behavior During Therapy: [facility] for tasks assessed/performed  GP Functional Assessment Tool Used:    Arlow Spiers, MPT, ATC 11/25/2013, 5:44 PM

## 2013-11-27 ENCOUNTER — Ambulatory Visit (HOSPITAL_COMMUNITY)
Admission: RE | Admit: 2013-11-27 | Discharge: 2013-11-27 | Disposition: A | Discharge status: Home / Self Care | Payer: Medicare Other | Source: Ambulatory Visit | Attending: Family Medicine | Admitting: Family Medicine

## 2013-11-27 DIAGNOSIS — R262 Difficulty in walking, not elsewhere classified: Secondary | ICD-10-CM

## 2013-11-27 DIAGNOSIS — M25661 Stiffness of right knee, not elsewhere classified: Secondary | ICD-10-CM

## 2013-11-27 NOTE — Progress Notes (Signed)
Physical Therapy Treatment Patient Details  Name: [name] MRN: [id number] Date of Birth: 1934/08/30  Today's Date: 11/27/2013 Time: 0355-9741 PT Time Calculation (min): 51 min Charge ;Manual 1435-1500, TE 1500-1510, [id number]; Gait 1510-1518  Visit#: 9 of 12  Re-eval: 12/03/13 Assessment Diagnosis: R TKR  Surgical Date: 10/05/13 Next MD Visit: Dr. Maureen Ralphs 12/17/2013  Authorization: Medicare  Authorization Time Period:    Authorization Visit#: 9 of 10   Subjective: Symptoms/Limitations Symptoms: Pt reports he is seeing improvements especially increase ease with walking and standng for longer periods of time.  Bending continues to be most difficult. Pain Assessment Currently in Pain?: Yes Pain Score: 1  Pain Location: Knee Pain Orientation: Right  Objective:   Exercise/Treatments Stretches Gastroc Stretch: 3 reps;30 seconds;Limitations Gastroc Stretch Limitations: slant board Aerobic Stationary Bike: 8' no resistance for ROM at seat 8,  full revolution Standing Gait Training: Gait training with PT facilitation to improve sequencing with UE, utilized SPC in each hand to improve sequencing x 520 feet Supine Quad Sets: 10 reps Heel Slides: Right;10 reps;Limitations Heel Slides Limitations: 5 sec holds Terminal Knee Extension: Right;15 reps;Limitations Terminal Knee Extension Limitations: 5" holds Knee Extension: PROM Knee Flexion: PROM Prone  Hamstring Curl: 15 reps;5 seconds;Limitations Contract/Relax to Increase Flexion: 5x 10" holds   Manual Therapy Manual Therapy: Massage Joint Mobilization: Patellar mobilizations in all directions, focus on superior/inferior mobs with soft tissue massage to scar tissue  Massage: To Rt gastroc region to reduce tightness and pain prone position Myofascial Release: MFR to anterior knee to reduce fascial restrictions with PROM for flexion and extensiontaken   Physical Therapy Assessment and Plan PT Assessment and  Plan Clinical Impression Statement: Session focus on manual therapy to reduce fascial restirctions on anterior knee, joint mobs complete to improve patella tracking, soft tissue massage complete to gastroc region to reduce tightness and PROM for flexion and extension. Added prone contract relax for flexion with improved AROM following manual at 1-105, PROM 0-112. Added bridges to POC for gluteal strengthening. Increased ease on bicycle today, seat 8. Gait training to improve hip mobility and UE sequencing to improve gait mechanics and balance with ambulation. Pt reported pain scale 1/10 at end of session, plans to apply ice at home PT Plan: Gcode due next session.  Focus on improving quad strengthening for improved AROM, manual techniques to reduce fascial restictions with PROM, continues to improve functional strength for knee and gluteal strength for improved gait mechanics.      Goals Home Exercise Program Pt/caregiver will Perform Home Exercise Program: Independently PT Short Term Goals Time to Complete Short Term Goals: 4 weeks PT Short Term Goal 1: Pt will improve his functional Rt knee AROM 5-105 degrees in order to go from sit to stand with greater ease.  PT Short Term Goal 1 - Progress: Partly met PT Short Term Goal 2: Pt will improve his hip abduction strength to 4/5 to prepare for independent gait with minimal gait impairments.  PT Short Term Goal 3: Pt will improve his dynamic balance and beign to ambulate independently w/moderate gait impairments.  PT Long Term Goals Time to Complete Long Term Goals: 8 weeks PT Long Term Goal 1: Pt will improve her FOTO limiation to less than 40% for improved QOL. PT Long Term Goal 2: Pt will improve his knee AROM 3-110 degrees in order to ambulate with minimal gait impairments independently to decrease risk of secondary injury.  PT Long Term Goal 2 - Progress: Progressing toward goal Long Term  Goal 3: Pt will improve his functional strength and  perform 5 sit to stands for standard surface without UE A in 12 seconds for normalized age value.  Long Term Goal 3 Progress: Progressing toward goal Long Term Goal 4: Pt will improve his strength to [facility] in order to ascend and desecnd 5 steps with 1 handrail with alternating pattern in order to safely enter community dwellings.   Problem List Patient Active Problem List   Diagnosis Date Noted  . Difficulty in walking(719.7) 11/03/2013  . Stiffness of right knee 11/03/2013  . Postoperative anemia due to acute blood loss 10/06/2013  . OA (osteoarthritis) of knee 10/05/2013  . Preoperative cardiovascular examination 07/20/2013  . Acute on chronic combined systolic and diastolic CHF (congestive heart failure) 04/29/2013  . Long QT interval 04/21/2013  . Volume overload 04/21/2013  . Long term (current) use of anticoagulants 01/09/2013  . HTN (hypertension) 05/20/2011  . Pleural effusion 05/20/2011  . Lactic acid acidosis 05/18/2011  . Pacemaker 05/18/2011  . Anemia 05/18/2011  . Acute renal failure (ARF) 05/18/2011  . Pneumonia 05/17/2011  . Fever 05/17/2011  . Atrial fibrillation 05/17/2011  . Hyperglycemia 05/17/2011  . Bradycardia 05/17/2011  . Gout 05/17/2011  . Arthritis 05/17/2011  . Thrombocytopenia 05/17/2011    PT - End of Session Activity Tolerance: Patient tolerated treatment well General Behavior During Therapy: [facility] for tasks assessed/performed  GP    Aldona Lento 11/27/2013, 6:50 PM

## 2013-11-30 ENCOUNTER — Ambulatory Visit (HOSPITAL_COMMUNITY)
Admission: RE | Admit: 2013-11-30 | Discharge: 2013-11-30 | Disposition: A | Discharge status: Home / Self Care | Payer: Medicare Other | Source: Ambulatory Visit | Attending: Family Medicine | Admitting: Family Medicine

## 2013-11-30 NOTE — Progress Notes (Addendum)
Physical Therapy Treatment Patient Details  Name: [name] MRN: [id number] Date of Birth: 1933-11-14  Today's Date: 11/30/2013 Time: 3005-1102 PT Time Calculation (min): 45 min Visit#: 10 of 12  Re-eval: 12/03/13 Assessment Diagnosis: R TKR  Surgical Date: 10/05/13 Next MD Visit: Dr. Maureen Ralphs 12/17/2013 Authorization: Medicare  Authorization Visit#: 10 of 20  Charges:  manual [id number] (17'), therex 0141-0301 (26')  Subjective: Pt states he's been working on his exercises at home.  States he's taking less pain meds.  Exercise/Treatments Aerobic Stationary Bike: 8' no resistance for ROM at seat 8,  full revolution Supine Quad Sets: 10 reps Knee Extension: PROM Knee Flexion: PROM Prone  Hamstring Curl: 15 reps;5 seconds;Limitations  Manual therapy:  Performed prior to therex:  MFR to perimeter of Rt knee to decrease adhesions and promote increased ROM.  MFR followed by AAROM and PROM.    Physical Therapy Assessment and Plan PT Assessment and Plan Clinical Impression Statement: Continued to focus on manual therapy to reduce fascial restrictions/improve ROM.  Noted adhesions entire perimeter of anterior Rt knee.  AROM today lacking 5-106 degrees, PROM 1-110 degrees in supine position.  Finished session with gentle AROM and bike to increse activity tolerance/ROM. PT Plan: Re-evaluate X 2 more visits.     Problem List Patient Active Problem List   Diagnosis Date Noted  . Difficulty in walking(719.7) 11/03/2013  . Stiffness of right knee 11/03/2013  . Postoperative anemia due to acute blood loss 10/06/2013  . OA (osteoarthritis) of knee 10/05/2013  . Preoperative cardiovascular examination 07/20/2013  . Acute on chronic combined systolic and diastolic CHF (congestive heart failure) 04/29/2013  . Long QT interval 04/21/2013  . Volume overload 04/21/2013  . Long term (current) use of anticoagulants 01/09/2013  . HTN (hypertension) 05/20/2011  . Pleural effusion  05/20/2011  . Lactic acid acidosis 05/18/2011  . Pacemaker 05/18/2011  . Anemia 05/18/2011  . Acute renal failure (ARF) 05/18/2011  . Pneumonia 05/17/2011  . Fever 05/17/2011  . Atrial fibrillation 05/17/2011  . Hyperglycemia 05/17/2011  . Bradycardia 05/17/2011  . Gout 05/17/2011  . Arthritis 05/17/2011  . Thrombocytopenia 05/17/2011       GP Functional Assessment Tool Used: FOTO/clinical judgement Functional Limitation: Mobility: Walking and moving around Mobility: Walking and Moving Around Current Status [phone]): At least 40 percent but less than 60 percent impaired, limited or restricted Mobility: Walking and Moving Around Goal Status [id number]): At least 40 percent but less than 60 percent impaired, limited or restricted  Teena Irani, PTA/CLT 11/30/2013, 4:19 PM

## 2013-12-02 ENCOUNTER — Ambulatory Visit (HOSPITAL_COMMUNITY)
Admission: RE | Admit: 2013-12-02 | Discharge: 2013-12-02 | Disposition: A | Discharge status: Home / Self Care | Payer: Medicare Other | Source: Ambulatory Visit | Attending: Family Medicine | Admitting: Family Medicine

## 2013-12-02 DIAGNOSIS — R262 Difficulty in walking, not elsewhere classified: Secondary | ICD-10-CM

## 2013-12-02 DIAGNOSIS — M25661 Stiffness of right knee, not elsewhere classified: Secondary | ICD-10-CM

## 2013-12-02 NOTE — Progress Notes (Signed)
Physical Therapy Treatment Patient Details  Name: [name] MRN: [id number] Date of Birth: Oct 15, 1934  Today's Date: 12/02/2013 Time: 6283-6629 Visit#: 39 of 12  Re-eval: 12/03/13 Diagnosis: R TKR  Surgical Date: 10/05/13 Next MD Visit: Dr. Maureen Ralphs 12/17/2013 Authorization: Medicare  Authorization Visit#: 11 of 20  Charges:  Manual 1432-1455 (23') , TE 1457-1518 (21') , icepack 1520-1530 (10')   Subjective: Symptoms/Limitations Symptoms: Pt reports soreness continues, pain scale 1/10 with pain meds today.  Pt reports increase ease with moveing patella.   Pain Assessment Currently in Pain?: Yes Pain Score: 1  Pain Location: Knee Pain Orientation: Right   Exercise/Treatments Stretches Active Hamstring Stretch: 3 reps;30 seconds;Limitations Active Hamstring Stretch Limitations: with rope Quad Stretch: 3 reps;30 seconds Supine Quad Sets: 10 reps Heel Slides: Right;10 reps;Limitations Heel Slides Limitations: 5 sec holds Knee Extension: PROM Knee Flexion: PROM Prone  Hamstring Curl: 15 reps;5 seconds;Limitations Other Prone Exercises: TKE 15X5" holds Other Prone Exercises: contract relax to increase flexion 3X   Manual Therapy: Myofascial release performed by Ihor Austin, PTA prior to therex Joint Mobilization: Patellar mobilizations in all directions, focus on superior/inferior mobs with soft tissue massage to scar tissue Myofascial Release: MFR to anterior knee to reduce fascial restrictions with PROM for flexion and extension  Physical Therapy Assessment and Plan PT Assessment and Plan Clinical Impression Statement: Improved patella mobility Rt and Lt, continues to be restricted superior and inferior. MFR complete to reduce fascial restrictions and scar tissue to improve patella tracking and ROM. PROM today 0-114 following manual and stretches.  Ended session with icepack today to help decrease soreness. PT Plan: Re-eval next session.       Problem  List Patient Active Problem List   Diagnosis Date Noted  . Difficulty in walking(719.7) 11/03/2013  . Stiffness of right knee 11/03/2013  . Postoperative anemia due to acute blood loss 10/06/2013  . OA (osteoarthritis) of knee 10/05/2013  . Preoperative cardiovascular examination 07/20/2013  . Acute on chronic combined systolic and diastolic CHF (congestive heart failure) 04/29/2013  . Long QT interval 04/21/2013  . Volume overload 04/21/2013  . Long term (current) use of anticoagulants 01/09/2013  . HTN (hypertension) 05/20/2011  . Pleural effusion 05/20/2011  . Lactic acid acidosis 05/18/2011  . Pacemaker 05/18/2011  . Anemia 05/18/2011  . Acute renal failure (ARF) 05/18/2011  . Pneumonia 05/17/2011  . Fever 05/17/2011  . Atrial fibrillation 05/17/2011  . Hyperglycemia 05/17/2011  . Bradycardia 05/17/2011  . Gout 05/17/2011  . Arthritis 05/17/2011  . Thrombocytopenia 05/17/2011    PT - End of Session Activity Tolerance: Patient tolerated treatment well General Behavior During Therapy: [facility] for tasks assessed/performed      Teena Irani, PTA/CLT 12/02/2013, 3:20 PM

## 2013-12-04 ENCOUNTER — Ambulatory Visit (HOSPITAL_COMMUNITY)
Admission: RE | Admit: 2013-12-04 | Discharge: 2013-12-04 | Disposition: A | Discharge status: Home / Self Care | Payer: Medicare Other | Source: Ambulatory Visit | Attending: Family Medicine | Admitting: Family Medicine

## 2013-12-04 DIAGNOSIS — R262 Difficulty in walking, not elsewhere classified: Secondary | ICD-10-CM

## 2013-12-04 DIAGNOSIS — M25661 Stiffness of right knee, not elsewhere classified: Secondary | ICD-10-CM

## 2013-12-04 NOTE — Progress Notes (Signed)
Physical Therapy Re-evaluation/Treatment  Patient Details  Name: [name] MRN: [id number] Date of Birth: 1934-06-02  Today's Date: 12/04/2013 Time: 2725-3664 PT Time Calculation (min): 62 min Charge ;Manual 1432-1445, MMT/ROM measurement 1445-1450, TE 1450-1518, Ice 4034-7425              Visit#: 12 of 12  Re-eval: 12/18/13 Assessment Diagnosis: R TKR  Surgical Date: 10/05/13 Next MD Visit: Dr. Maureen Ralphs 12/17/2013  Authorization: Medicare    Authorization Time Period: G code complete 10th visit  Authorization Visit#: 12 of 20   Subjective Symptoms/Limitations Symptoms: Pt reports his pain is improving, very minimal today.   How long can you sit comfortably?: Able to sit comfortably through church services for 50-60 minutes. How long can you stand comfortably?: most difficulty 20  minutes (was 5-10 minutes (difficulty with shaving) How long can you walk comfortably?: Able to walk for 20-30 minutes comfortably without AD (was with a SPC 5 minutes) Pain Assessment Currently in Pain?: Yes Pain Score: 1  Pain Location: Knee Pain Orientation: Right  Sensation/Coordination/Flexibility/Functional Tests Functional Tests Functional Tests: FOTO:58/42 (was 43/57) on 11/30/2013 Functional Tests: 5 sit to stands w/o UE A from lowered mat surface: 12 seconds  Assessment RLE AROM (degrees) Right Knee Extension: 6 (was 15) Right Knee Flexion: 105 (was 85) RLE PROM (degrees) Right Knee Extension: 0 (was 10) Right Knee Flexion: 114 (was 92) RLE Strength Right Hip Flexion: 5/5 Right Hip Extension: 4/5 Right Hip ABduction:  (4-/5) Right Hip ADduction: 4/5 Right Knee Flexion: 5/5  Exercise/Treatments Aerobic Stationary Bike: 6' no resistance for ROM at seat 8--7,  full revolution Isokinetic: Biodex PROM x 6 min 100% both directions Standing Stairs: 1Rt reciprocal pattern SLS: Rt 12", Lt 3" max of 3 Supine Quad Sets: 10 reps Heel Slides: Right;10 reps;Limitations Heel Slides  Limitations: 5 sec holds Knee Extension: PROM Knee Flexion: PROM  Modalities Modalities: Cryotherapy Manual Therapy Manual Therapy: Myofascial release Joint Mobilization: Patellar mobilizations in all directions, focus on superior/inferior mobs with soft tissue massage to scar tissue Myofascial Release: MFR to anterior knee to reduce fascial restrictions with PROM for flexion and extension Cryotherapy Number Minutes Cryotherapy: 10 Minutes Cryotherapy Location: Knee Type of Cryotherapy: Ice pack  Physical Therapy Assessment and Plan PT Assessment and Plan Clinical Impression Statement: Re-eval complete with the following findings:  Pt independent with HEP daily and able to demonstrate appropraite technique with all exercises.  Pt has improved AROM 6-105 and PROM 0-114.  Improved LE strength with improved gait mechanics noted and improved functional strength with ability to complete 5 sit to stand in 12 seconds with no HHA required.  Pt will continue to benefit from skilled intervention to improve AROM, functional strength for increased ease with stair training and to improve balance for improved gait mechanics and reduce risk of injury. PT Plan: Following discussion with pt., recommendation to continue OPPT for 2 more weeks to improve ROM, balance, gait and functional strengthening.      Goals Home Exercise Program Pt/caregiver will Perform Home Exercise Program: Independently PT Goal: Perform Home Exercise Program - Progress: Met (2x daily) PT Short Term Goals Time to Complete Short Term Goals: 4 weeks PT Short Term Goal 1: Pt will improve his functional Rt knee AROM 5-105 degrees in order to go from sit to stand with greater ease.  PT Short Term Goal 1 - Progress: Progressing toward goal PT Short Term Goal 2: Pt will improve his hip abduction strength to 4/5 to prepare for independent gait  with minimal gait impairments.  PT Short Term Goal 2 - Progress: Partly met (Abduction strength  4-/5) PT Short Term Goal 3: Pt will improve his dynamic balance and beign to ambulate independently w/moderate gait impairments.  PT Short Term Goal 3 - Progress: Progressing toward goal PT Long Term Goals Time to Complete Long Term Goals: 8 weeks PT Long Term Goal 1: Pt will improve her FOTO limiation to less than 40% for improved QOL. PT Long Term Goal 1 - Progress: Progressing toward goal (58/42) PT Long Term Goal 2: Pt will improve his knee AROM 3-110 degrees in order to ambulate with minimal gait impairments independently to decrease risk of secondary injury.  PT Long Term Goal 2 - Progress: Progressing toward goal Long Term Goal 3: Pt will improve his functional strength and perform 5 sit to stands for standard surface without UE A in 12 seconds for normalized age value.  Long Term Goal 3 Progress: Met Long Term Goal 4: Pt will improve his strength to [facility] in order to ascend and desecnd 5 steps with 1 handrail with alternating pattern in order to safely enter community dwellings.  Long Term Goal 4 Progress: Progressing toward goal  Problem List Patient Active Problem List   Diagnosis Date Noted  . Difficulty in walking(719.7) 11/03/2013  . Stiffness of right knee 11/03/2013  . Postoperative anemia due to acute blood loss 10/06/2013  . OA (osteoarthritis) of knee 10/05/2013  . Preoperative cardiovascular examination 07/20/2013  . Acute on chronic combined systolic and diastolic CHF (congestive heart failure) 04/29/2013  . Long QT interval 04/21/2013  . Volume overload 04/21/2013  . Long term (current) use of anticoagulants 01/09/2013  . HTN (hypertension) 05/20/2011  . Pleural effusion 05/20/2011  . Lactic acid acidosis 05/18/2011  . Pacemaker 05/18/2011  . Anemia 05/18/2011  . Acute renal failure (ARF) 05/18/2011  . Pneumonia 05/17/2011  . Fever 05/17/2011  . Atrial fibrillation 05/17/2011  . Hyperglycemia 05/17/2011  . Bradycardia 05/17/2011  . Gout 05/17/2011  . Arthritis  05/17/2011  . Thrombocytopenia 05/17/2011    PT - End of Session Activity Tolerance: Patient tolerated treatment well General Behavior During Therapy: [facility] for tasks assessed/performed  GP    Aldona Lento 12/04/2013, 6:29 PM  Physician Documentation Your signature is required to indicate approval of the treatment plan as stated above.  Please sign and either send electronically or make a copy of this report for your files and return this physician signed original.   Please mark one 1.__approve of plan  2. ___approve of plan with the following conditions.   ______________________________                                                          _____________________ Physician Signature  Date  

## 2013-12-08 ENCOUNTER — Ambulatory Visit (HOSPITAL_COMMUNITY)
Admission: RE | Admit: 2013-12-08 | Discharge: 2013-12-08 | Disposition: A | Discharge status: Home / Self Care | Payer: Medicare Other | Source: Ambulatory Visit | Attending: Orthopedic Surgery | Admitting: Orthopedic Surgery

## 2013-12-08 ENCOUNTER — Ambulatory Visit (INDEPENDENT_AMBULATORY_CARE_PROVIDER_SITE_OTHER): Payer: Medicare Other | Admitting: Pharmacist Clinician (PhC)/ Clinical Pharmacy Specialist

## 2013-12-08 VITALS — BP 152/86 | HR 72

## 2013-12-08 DIAGNOSIS — R262 Difficulty in walking, not elsewhere classified: Secondary | ICD-10-CM | POA: Diagnosis not present

## 2013-12-08 DIAGNOSIS — M25669 Stiffness of unspecified knee, not elsewhere classified: Secondary | ICD-10-CM | POA: Insufficient documentation

## 2013-12-08 DIAGNOSIS — I4891 Unspecified atrial fibrillation: Secondary | ICD-10-CM | POA: Diagnosis not present

## 2013-12-08 DIAGNOSIS — Z7901 Long term (current) use of anticoagulants: Secondary | ICD-10-CM

## 2013-12-08 DIAGNOSIS — M25661 Stiffness of right knee, not elsewhere classified: Secondary | ICD-10-CM

## 2013-12-08 DIAGNOSIS — IMO0001 Reserved for inherently not codable concepts without codable children: Secondary | ICD-10-CM | POA: Diagnosis not present

## 2013-12-08 DIAGNOSIS — I10 Essential (primary) hypertension: Secondary | ICD-10-CM | POA: Insufficient documentation

## 2013-12-08 DIAGNOSIS — M25569 Pain in unspecified knee: Secondary | ICD-10-CM | POA: Diagnosis not present

## 2013-12-08 LAB — POCT INR: INR: 2.1

## 2013-12-08 NOTE — Progress Notes (Signed)
Physical Therapy Treatment Patient Details  Name: [name] MRN: [id number] Date of Birth: April 09, 1934  Today's Date: 12/08/2013 Time: 1345-1440 PT Time Calculation (min): 55 min Charge Manual 1345-1402 TE 1402-1430 Ice 1430-1440  Visit#: 13 of 20  Re-eval: 12/18/13    Authorization: Medicare  Authorization Time Period: G code complete 10th visit  Authorization Visit#: 13 of 20   Subjective: Symptoms/Limitations Symptoms: Pt stated he was sore today, went to [facility] this morning at 10 and have done alot of walking Pain Assessment Currently in Pain?: Yes Pain Score: 2  Pain Location: Knee Pain Orientation: Right  Objective:   Exercise/Treatments Aerobic Stationary Bike: 6' on upright bike Standing Forward Lunges: Both;10 reps;5 seconds Terminal Knee Extension: Right;15 reps;Theraband Theraband Level (Terminal Knee Extension): Level 4 (Blue) Lateral Step Up: Right;15 reps;Hand Hold: 1;Step Height: 6" Forward Step Up: Right;15 reps;Hand Hold: 1;Step Height: 6" Step Down: Right;Hand Hold: 1;Step Height: 4";15 reps Supine Quad Sets: 15 reps Short Arc Quad Sets: Right;15 reps Short Arc Quad Sets Limitations: 4# Heel Slides: 15 reps;Limitations Heel Slides Limitations: 5 sec holds Terminal Knee Extension: Right;15 reps;Limitations Terminal Knee Extension Limitations: 5" holds Patellar Mobs: Done Knee Extension: PROM Knee Flexion: PROM   Modalities Modalities: Cryotherapy Manual Therapy Manual Therapy: Myofascial release Myofascial Release: MFR to anterior knee to reduce fascial restrictions with PROM for flexion and extension Cryotherapy Number Minutes Cryotherapy: 10 Minutes Cryotherapy Location: Knee Type of Cryotherapy: Ice pack  Physical Therapy Assessment and Plan PT Assessment and Plan Clinical Impression Statement: Improved patella mobility Rt and Lt following manual techniques, continues to be restricted superior and inferior.  AR(M 4-110; PROM  0-114.  Added lunges for functional strengthening.with cueing for form.   PT Plan: Continue with manual techniques to reduce fascial restrictions to improve ROM, functional strengthening, gait and balance training.      Goals Home Exercise Program Pt/caregiver will Perform Home Exercise Program: Independently PT Short Term Goals Time to Complete Short Term Goals: 4 weeks PT Short Term Goal 1: Pt will improve his functional Rt knee AROM 5-105 degrees in order to go from sit to stand with greater ease.  PT Short Term Goal 1 - Progress: Met PT Short Term Goal 2: Pt will improve his hip abduction strength to 4/5 to prepare for independent gait with minimal gait impairments.  PT Short Term Goal 3: Pt will improve his dynamic balance and beign to ambulate independently w/moderate gait impairments.  PT Long Term Goals Time to Complete Long Term Goals: 8 weeks PT Long Term Goal 1: Pt will improve her FOTO limiation to less than 40% for improved QOL. PT Long Term Goal 2: Pt will improve his knee AROM 3-110 degrees in order to ambulate with minimal gait impairments independently to decrease risk of secondary injury.  PT Long Term Goal 2 - Progress: Progressing toward goal Long Term Goal 3: Pt will improve his functional strength and perform 5 sit to stands for standard surface without UE A in 12 seconds for normalized age value.  Long Term Goal 4: Pt will improve his strength to [facility] in order to ascend and desecnd 5 steps with 1 handrail with alternating pattern in order to safely enter community dwellings.  Long Term Goal 4 Progress: Progressing toward goal  Problem List Patient Active Problem List   Diagnosis Date Noted  . Difficulty in walking(719.7) 11/03/2013  . Stiffness of right knee 11/03/2013  . Postoperative anemia due to acute blood loss 10/06/2013  . OA (osteoarthritis) of  knee 10/05/2013  . Preoperative cardiovascular examination 07/20/2013  . Acute on chronic combined systolic and  diastolic CHF (congestive heart failure) 04/29/2013  . Long QT interval 04/21/2013  . Volume overload 04/21/2013  . Long term (current) use of anticoagulants 01/09/2013  . HTN (hypertension) 05/20/2011  . Pleural effusion 05/20/2011  . Lactic acid acidosis 05/18/2011  . Pacemaker 05/18/2011  . Anemia 05/18/2011  . Acute renal failure (ARF) 05/18/2011  . Pneumonia 05/17/2011  . Fever 05/17/2011  . Atrial fibrillation 05/17/2011  . Hyperglycemia 05/17/2011  . Bradycardia 05/17/2011  . Gout 05/17/2011  . Arthritis 05/17/2011  . Thrombocytopenia 05/17/2011    PT - End of Session Activity Tolerance: Patient tolerated treatment well General Behavior During Therapy: [facility] for tasks assessed/performed  GP    Aldona Lento 12/08/2013, 2:35 PM

## 2013-12-08 NOTE — Progress Notes (Deleted)
Physical Therapy Treatment Patient Details  Name: [name] MRN: [id number] Date of Birth: 09-May-1934  Today's Date: 12/08/2013 Time: 1345-1440 PT Time Calculation (min): 55 min Charge Manual 1345-1402 TE 1402-1430 Ice 1430-1440  Visit#: 13 of 20  Re-eval: 12/18/13    Authorization: Medicare  Authorization Time Period: G code complete 10th visit  Authorization Visit#: 13 of 20   Subjective: Symptoms/Limitations Symptoms: Pt stated he was sore today, went to [facility] this morning at 10 and have done alot of walking Pain Assessment Currently in Pain?: Yes Pain Score: 2  Pain Location: Knee Pain Orientation: Right  Objective:  Exercise/Treatments Aerobic Stationary Bike: 6' on upright bike Standing Forward Lunges: Both;10 reps;5 seconds Terminal Knee Extension: Right;15 reps;Theraband Theraband Level (Terminal Knee Extension): Level 4 (Blue) Lateral Step Up: Right;15 reps;Hand Hold: 1;Step Height: 6" Forward Step Up: Right;15 reps;Hand Hold: 1;Step Height: 6" Step Down: Right;Hand Hold: 1;Step Height: 4";15 reps Supine Quad Sets: 15 reps Short Arc Quad Sets: Right;15 reps Short Arc Quad Sets Limitations: 4# Heel Slides: 15 reps;Limitations Heel Slides Limitations: 5 sec holds Terminal Knee Extension: Right;15 reps;Limitations Terminal Knee Extension Limitations: 5" holds Patellar Mobs: Done Knee Extension: PROM Knee Flexion: PROM  Modalities Modalities: Cryotherapy Manual Therapy Manual Therapy: Myofascial release Myofascial Release: MFR to anterior knee to reduce fascial restrictions with PROM for flexion and extension Cryotherapy Number Minutes Cryotherapy: 10 Minutes Cryotherapy Location: Knee Type of Cryotherapy: Ice pack  Physical Therapy Assessment and Plan PT Assessment and Plan Clinical Impression Statement: Improved patella mobility Rt and Lt following manual techniques, continues to be restricted superior and inferior.  AR(M 4-110; PROM  0-114.  Added lunges for functional strengthening.with cueing for form.   PT Plan: Continue with manual techniques to reduce fascial restrictions to improve ROM, functional strengthening, gait and balance training.      Goals Home Exercise Program Pt/caregiver will Perform Home Exercise Program: Independently PT Short Term Goals Time to Complete Short Term Goals: 4 weeks PT Short Term Goal 1: Pt will improve his functional Rt knee AROM 5-105 degrees in order to go from sit to stand with greater ease.  PT Short Term Goal 1 - Progress: Met PT Short Term Goal 2: Pt will improve his hip abduction strength to 4/5 to prepare for independent gait with minimal gait impairments.  PT Short Term Goal 3: Pt will improve his dynamic balance and beign to ambulate independently w/moderate gait impairments.  PT Long Term Goals Time to Complete Long Term Goals: 8 weeks PT Long Term Goal 1: Pt will improve her FOTO limiation to less than 40% for improved QOL. PT Long Term Goal 2: Pt will improve his knee AROM 3-110 degrees in order to ambulate with minimal gait impairments independently to decrease risk of secondary injury.  PT Long Term Goal 2 - Progress: Progressing toward goal Long Term Goal 3: Pt will improve his functional strength and perform 5 sit to stands for standard surface without UE A in 12 seconds for normalized age value.  Long Term Goal 4: Pt will improve his strength to [facility] in order to ascend and desecnd 5 steps with 1 handrail with alternating pattern in order to safely enter community dwellings.  Long Term Goal 4 Progress: Progressing toward goal  Problem List Patient Active Problem List   Diagnosis Date Noted  . Difficulty in walking(719.7) 11/03/2013  . Stiffness of right knee 11/03/2013  . Postoperative anemia due to acute blood loss 10/06/2013  . OA (osteoarthritis) of knee 10/05/2013  .  Preoperative cardiovascular examination 07/20/2013  . Acute on chronic combined systolic and  diastolic CHF (congestive heart failure) 04/29/2013  . Long QT interval 04/21/2013  . Volume overload 04/21/2013  . Long term (current) use of anticoagulants 01/09/2013  . HTN (hypertension) 05/20/2011  . Pleural effusion 05/20/2011  . Lactic acid acidosis 05/18/2011  . Pacemaker 05/18/2011  . Anemia 05/18/2011  . Acute renal failure (ARF) 05/18/2011  . Pneumonia 05/17/2011  . Fever 05/17/2011  . Atrial fibrillation 05/17/2011  . Hyperglycemia 05/17/2011  . Bradycardia 05/17/2011  . Gout 05/17/2011  . Arthritis 05/17/2011  . Thrombocytopenia 05/17/2011    PT - End of Session Activity Tolerance: Patient tolerated treatment well General Behavior During Therapy: [facility] for tasks assessed/performed  GP    Aldona Lento 12/08/2013, 3:30 PM

## 2013-12-10 ENCOUNTER — Ambulatory Visit (HOSPITAL_COMMUNITY)
Admission: RE | Admit: 2013-12-10 | Discharge: 2013-12-10 | Disposition: A | Discharge status: Home / Self Care | Payer: Medicare Other | Source: Ambulatory Visit | Attending: Family Medicine | Admitting: Family Medicine

## 2013-12-10 DIAGNOSIS — M25661 Stiffness of right knee, not elsewhere classified: Secondary | ICD-10-CM

## 2013-12-10 DIAGNOSIS — R262 Difficulty in walking, not elsewhere classified: Secondary | ICD-10-CM

## 2013-12-10 NOTE — Progress Notes (Signed)
Physical Therapy Treatment Patient Details  Name: [name] MRN: [id number] Date of Birth: [birth year]  Today's Date: 12/10/2013 Time: 5366-4403 PT Time Calculation (min): 19 min Charge Manual 1345-1400, TE 4742-5956, NMR 1423-1433, Ice 3875-6433  Visit#: 14 of 20  Re-eval: 12/18/13 Assessment Diagnosis: R TKR  Surgical Date: 10/05/13 Next MD Visit: Dr. Maureen Ralphs 12/17/2013  Authorization: Medicare  Authorization Time Period: G code complete 10th visit  Authorization Visit#: 14 of 20   Subjective: Symptoms/Limitations Symptoms: Pt reportes he is sore today, wasnt able to work alot on knee this morning, having floor worked on at home.  Pt compliant with HEP, stated balance most difficult task currently. Pain Assessment Currently in Pain?: Yes Pain Score: 1  Pain Location: Knee Pain Orientation: Right  Objective:   Exercise/Treatments Stretches Active Hamstring Stretch: 3 reps;30 seconds;Limitations Active Hamstring Stretch Limitations: with rope Aerobic Stationary Bike: 8' on upright bike Standing Terminal Knee Extension: Theraband;Limitations Theraband Level (Terminal Knee Extension): Level 4 (Blue) Terminal Knee Extension Limitations: given theraband for HEP Rocker Board: 2 minutes;Limitations Rocker Board Limitations: R/L and A/P, Stab SLS: Rt 18", LT 13" max of 3 Supine Quad Sets: 15 reps Terminal Knee Extension: Right;15 reps;Limitations Terminal Knee Extension Limitations: 5" holds Patellar Mobs: Done Knee Extension: PROM Knee Flexion: PROM  Balance Exercises Standing Standing, One Foot on a Step: Eyes open;6 inch;3 reps;30 secs;Limitations Standing, One Foot on a Step Limitations: 6in step Balance Beam: tandem and retro 1RT Tandem Gait: Forward;1 rep Retro Gait: 1 rep      Modalities Modalities: Cryotherapy Manual Therapy Manual Therapy: Myofascial release Myofascial Release: MFR to anterior knee to reduce fascial restrictions with PROM for  flexion and extension Cryotherapy Number Minutes Cryotherapy: 10 Minutes Cryotherapy Location: Knee Type of Cryotherapy: Ice pack  Physical Therapy Assessment and Plan PT Assessment and Plan Clinical Impression Statement: Improved AROM 1-112 following manual with improved patella mobility superior and inferior.  Progressed to balance activities with min assistance required and cueing for spatial awareness with less assistance required.   PT Plan: Continue with manual techniques to reduce fascial restrictions to improve ROM, functional strengthening, gait and balance training.      Goals    Problem List Patient Active Problem List   Diagnosis Date Noted  . Difficulty in walking(719.7) 11/03/2013  . Stiffness of right knee 11/03/2013  . Postoperative anemia due to acute blood loss 10/06/2013  . OA (osteoarthritis) of knee 10/05/2013  . Preoperative cardiovascular examination 07/20/2013  . Acute on chronic combined systolic and diastolic CHF (congestive heart failure) 04/29/2013  . Long QT interval 04/21/2013  . Volume overload 04/21/2013  . Long term (current) use of anticoagulants 01/09/2013  . HTN (hypertension) 05/20/2011  . Pleural effusion 05/20/2011  . Lactic acid acidosis 05/18/2011  . Pacemaker 05/18/2011  . Anemia 05/18/2011  . Acute renal failure (ARF) 05/18/2011  . Pneumonia 05/17/2011  . Fever 05/17/2011  . Atrial fibrillation 05/17/2011  . Hyperglycemia 05/17/2011  . Bradycardia 05/17/2011  . Gout 05/17/2011  . Arthritis 05/17/2011  . Thrombocytopenia 05/17/2011    PT - End of Session Activity Tolerance: Patient tolerated treatment well General Behavior During Therapy: [facility] for tasks assessed/performed  GP    Aldona Lento 12/10/2013, 4:50 PM

## 2013-12-14 ENCOUNTER — Ambulatory Visit (HOSPITAL_COMMUNITY)
Admission: RE | Admit: 2013-12-14 | Discharge: 2013-12-14 | Disposition: A | Discharge status: Home / Self Care | Payer: Medicare Other | Source: Ambulatory Visit | Attending: Family Medicine | Admitting: Family Medicine

## 2013-12-14 DIAGNOSIS — R262 Difficulty in walking, not elsewhere classified: Secondary | ICD-10-CM

## 2013-12-14 DIAGNOSIS — M25661 Stiffness of right knee, not elsewhere classified: Secondary | ICD-10-CM

## 2013-12-14 NOTE — Evaluation (Signed)
Physical Therapy Evaluation  Patient Details  Name: [name] MRN: [id number] Date of Birth: [birth year]  Today's Date: 12/14/2013 Time: 1430-1530 PT Time Calculation (min): 60 min   Ice x 10 min  1430 - 1450 manual  1450 - 13 NMR  1505-1515 TE            Visit#: 15 of 20  Re-eval: 01/13/14 Assessment Diagnosis: R TKR  Surgical Date: 10/05/13 Next MD Visit: Dr. Maureen Ralphs 12/17/2013  Authorization: Medicare    Authorization Time Period: G code complete 10th visit  Authorization Visit#: 23 of 51   Past Medical History:  Past Medical History  Diagnosis Date  . Pacemaker     medtronic  . CAD (coronary artery disease)     echo 09-05-2010-EF nl, mod-severe dilated Left atrium; myoview 02-08-12-EF 52%, no ischemia  . HTN (hypertension)   . Barrett's esophagus   . Gout   . Arthritis   . History of cardioversion 02/09/13    electrical synchronized cardioversion, on flecainide and warfarin  . H/O transesophageal echocardiography (TEE) for monitoring 09/05/10    tee guided AT pace termination, did not proceed with cardioversion due to termination of the atrial flutter through his pacemaker  . Atrial fibrillation   . History of stomach ulcers     many yrs ago  . GERD (gastroesophageal reflux disease)   . History of kidney stones     per pt - no actual diagnosisi  . Cancer     prostate - radiation only  . Pacemaker 09-25-13  . Prostate cancer 09-25-13    tx. with radiation only- dx. 2013  . Diabetes mellitus without complication     "prediabetic"-no oral meds   Past Surgical History:  Past Surgical History  Procedure Laterality Date  . Pacemaker insertion      medtronic adapta- Dr. Sallyanne Kuster follows- LOV 9'14  . Tonsillectomy    . Appendectomy    . Cardioversion N/A 02/09/2013    Procedure: CARDIOVERSION;  Surgeon: Sanda Klein, MD;  Location: [facility] ENDOSCOPY;  Service: Cardiovascular;  Laterality: N/A;  . Cardiac catheterization  02/18/06    EF 65%, focal napkin ring-like  lesion into the prox LAD otherwise normal coronaries  . Total knee arthroplasty Right 10/05/2013    Procedure: RIGHT TOTAL KNEE ARTHROPLASTY;  Surgeon: Gearlean Alf, MD;  Location: [facility] ORS;  Service: Orthopedics;  Laterality: Right;    Subjective Symptoms/Limitations Symptoms: reports right knee is sore but overall improving, compliant with HEP, static balance challenging  How long can you sit comfortably?: Able to sit comfortably through church services for 50-60 minutes. How long can you walk comfortably?: 30 min no device  Pain Assessment Currently in Pain?: No/denies    RLE AROM (degrees) Right Knee Extension: 0 Right Knee Flexion: 112 RLE PROM (degrees) Right Knee Extension: 0 Right Knee Flexion: 115 RLE Strength Right Hip ADduction: 4/5 Right Knee Flexion: 5/5  Exercise/Treatments Mobility/Balance      Stretches Active Hamstring Stretch: 3 reps;30 seconds;Limitations Active Hamstring Stretch Limitations: with rope Aerobic Stationary Bike: 8' on upright bike Standing Terminal Knee Extension: Theraband;Limitations Theraband Level (Terminal Knee Extension): Level 4 (Blue) Terminal Knee Extension Limitations: 15  Forward Step Up: Right;15 reps;Hand Hold: 1;Step Height: 6" Seated   Supine Quad Sets: 15 reps     Balance Exercises Standing Standing, One Foot on a Step: Eyes open;6 inch;3 reps;30 secs;Limitations Standing, One Foot on a Step Limitations: 6in step Balance Beam: tandem and retro 1RT Tandem Gait: Forward;1 rep Retro  Gait: 1 rep Sidestepping: 1 rep     Modalities Modalities: Cryotherapy Manual Therapy Manual Therapy: Myofascial release Joint Mobilization: pateallar mobs all directions, right knee PROM flexion, tibia AP and PA glides for knee flexion and extension  Myofascial Release: MFR to distal quad R  Cryotherapy Number Minutes Cryotherapy: 10 Minutes Cryotherapy Location: Knee  Physical Therapy Assessment and Plan PT Assessment and  Plan Clinical Impression Statement: AROM post treatment at right knee 0-112, PROM 0-115. Stair use reciprocally limited by left knee pain. Patient requires furtehr skilled therapy to maintain his new achievement of full knee extension and continue to improved right knee flexion for normal gait, transfers, and mobility  Pt will benefit from skilled therapeutic intervention in order to improve on the following deficits: Abnormal gait;Difficulty walking;Pain;Impaired perceived functional ability;Decreased range of motion;Decreased mobility;Decreased strength;Decreased balance;Increased muscle spasms;Increased fascial restricitons;Increased edema;Impaired flexibility Rehab Potential: Good PT Frequency: Min 2X/week PT Duration:  (3 weeks  ( 5 more visits) ) PT Treatment/Interventions: Gait training;Stair training;Therapeutic exercise;Manual techniques;Patient/family education;Neuromuscular re-education;Balance training;Modalities PT Plan: Continue with manual techniques to reduce fascial restrictions to improve ROM, functional strengthening, gait and balance training.  continue X 5 visits     Goals PT Short Term Goals Time to Complete Short Term Goals: 4 weeks PT Short Term Goal 1: Pt will improve his functional Rt knee AROM 5-105 degrees in order to go from sit to stand with greater ease.  PT Short Term Goal 1 - Progress: Met PT Short Term Goal 2: Pt will improve his hip abduction strength to 4/5 to prepare for independent gait with minimal gait impairments.  PT Short Term Goal 2 - Progress: Progressing toward goal PT Short Term Goal 3: Pt will improve his dynamic balance and beign to ambulate independently w/moderate gait impairments.  PT Short Term Goal 3 - Progress: Progressing toward goal PT Long Term Goals PT Long Term Goal 1: Pt will improve her FOTO limiation to less than 40% for improved QOL. PT Long Term Goal 2: Pt will improve his knee AROM 3-110 degrees in order to ambulate with minimal  gait impairments independently to decrease risk of secondary injury.  PT Long Term Goal 2 - Progress: Met Long Term Goal 3: Pt will improve his functional strength and perform 5 sit to stands for standard surface without UE A in 12 seconds for normalized age value.  Long Term Goal 3 Progress: Met Long Term Goal 4: Pt will improve his strength to [facility] in order to ascend and desecnd 5 steps with 1 handrail with alternating pattern in order to safely enter community dwellings.  Long Term Goal 4 Progress: Progressing toward goal PT Long Term Goal 5: Pt will maintain full knee extension AROM And dispaly 115 AROM knee flexion - new goal  12/14/2013   Problem List Patient Active Problem List   Diagnosis Date Noted  . Difficulty in walking(719.7) 11/03/2013  . Stiffness of right knee 11/03/2013  . Postoperative anemia due to acute blood loss 10/06/2013  . OA (osteoarthritis) of knee 10/05/2013  . Preoperative cardiovascular examination 07/20/2013  . Acute on chronic combined systolic and diastolic CHF (congestive heart failure) 04/29/2013  . Long QT interval 04/21/2013  . Volume overload 04/21/2013  . Long term (current) use of anticoagulants 01/09/2013  . HTN (hypertension) 05/20/2011  . Pleural effusion 05/20/2011  . Lactic acid acidosis 05/18/2011  . Pacemaker 05/18/2011  . Anemia 05/18/2011  . Acute renal failure (ARF) 05/18/2011  . Pneumonia 05/17/2011  . Fever 05/17/2011  .  Atrial fibrillation 05/17/2011  . Hyperglycemia 05/17/2011  . Bradycardia 05/17/2011  . Gout 05/17/2011  . Arthritis 05/17/2011  . Thrombocytopenia 05/17/2011       GP    Kaydo,Michelle 12/14/2013, 3:45 PM  Physician Documentation Your signature is required to indicate approval of the treatment plan as stated above.  Please sign and either send electronically or make a copy of this report for your files and return this physician signed original.   Please mark one 1.__approve of plan  2. ___approve of plan  with the following conditions.   ______________________________                                                          _____________________ Physician Signature                                                                                                             Date

## 2013-12-18 ENCOUNTER — Ambulatory Visit (HOSPITAL_COMMUNITY)
Admission: RE | Admit: 2013-12-18 | Discharge: 2013-12-18 | Disposition: A | Discharge status: Home / Self Care | Payer: Medicare Other | Source: Ambulatory Visit | Attending: Family Medicine | Admitting: Family Medicine

## 2013-12-18 DIAGNOSIS — M25661 Stiffness of right knee, not elsewhere classified: Secondary | ICD-10-CM

## 2013-12-18 DIAGNOSIS — R262 Difficulty in walking, not elsewhere classified: Secondary | ICD-10-CM

## 2013-12-18 NOTE — Progress Notes (Signed)
Physical Therapy Treatment Patient Details  Name: [name] MRN: [id number] Date of Birth: [birth year]  Today's Date: 12/18/2013 Time: 1324-4010 PT Time Calculation (min): 33 min 1430 - 1450 manual  1450 - 1501  TE  Visit#: 16 of 20  Re-eval: 01/13/14 Assessment Diagnosis: R TKR  Surgical Date: 10/05/13 Next MD Visit: Dr. Maureen Ralphs 12/17/2013  Authorization: Medicare  Authorization Time Period:    Authorization Visit#: 16 of 20   Subjective: Symptoms/Limitations Symptoms: no pain, dr pleased with progress , needs shorter session wife is home alone with workers at house, did not wantto cancel todays session  Pain Assessment Currently in Pain?: No/denies   Exercise/Treatment       Stretches Active Hamstring Stretch: 3 reps;30 seconds;Limitations Aerobic Stationary Bike: 8' on upright bike   Standing Forward Step Up: Right;15 reps;Hand Hold: 1;Step Height: 6"     Manual Therapyx20 min  Manual Therapy: Joint mobilization Joint Mobilization: tibia AP and PA glides right knee, internal and external rotation tibia glides  Other Manual Therapy: righ PROM flexion and extension   Physical Therapy Assessment and Plan PT Assessment and Plan Clinical Impression Statement: supine AROM post manual 0- 115 right knee  PT Plan: Continue with manual techniques to reduce fascial restrictions to improve ROM, functional strengthening, gait and balance training.  continue X  2-4                                                                                                                                                                                                            visits     Goals PT Short Term Goals Time to Complete Short Term Goals: 4 weeks PT Short Term Goal 1: Pt will improve his functional Rt knee AROM 5-105 degrees in order to go from sit to stand with greater ease.  PT Short Term Goal 1 - Progress: Met PT Short Term Goal 2: Pt will improve his hip abduction  strength to 4/5 to prepare for independent gait with minimal gait impairments.  PT Short Term Goal 2 - Progress: Progressing toward goal PT Short Term Goal 3: Pt will improve his dynamic balance and beign to ambulate independently w/moderate gait impairments.  PT Short Term Goal 3 - Progress: Met PT Long Term Goals Time to Complete Long Term Goals: 8 weeks PT Long Term Goal 1: Pt will improve her FOTO limiation to less than 40% for improved QOL. PT Long Term Goal 1 - Progress: Progressing toward goal PT Long Term Goal 2: Pt will improve his knee AROM 3-110  degrees in order to ambulate with minimal gait impairments independently to decrease risk of secondary injury.  PT Long Term Goal 2 - Progress: Met Long Term Goal 3: Pt will improve his functional strength and perform 5 sit to stands for standard surface without UE A in 12 seconds for normalized age value.  Long Term Goal 3 Progress: Met Long Term Goal 4: Pt will improve his strength to [facility] in order to ascend and desecnd 5 steps with 1 handrail with alternating pattern in order to safely enter community dwellings.  Long Term Goal 4 Progress: Progressing toward goal PT Long Term Goal 5: Pt will maintain full knee extension AROM And dispaly 115 AROM knee flexion - new goal  12/14/2013  Long Term Goal 5 Progress: Progressing toward goal  Problem List Patient Active Problem List   Diagnosis Date Noted  . Difficulty in walking(719.7) 11/03/2013  . Stiffness of right knee 11/03/2013  . Postoperative anemia due to acute blood loss 10/06/2013  . OA (osteoarthritis) of knee 10/05/2013  . Preoperative cardiovascular examination 07/20/2013  . Acute on chronic combined systolic and diastolic CHF (congestive heart failure) 04/29/2013  . Long QT interval 04/21/2013  . Volume overload 04/21/2013  . Long term (current) use of anticoagulants 01/09/2013  . HTN (hypertension) 05/20/2011  . Pleural effusion 05/20/2011  . Lactic acid acidosis 05/18/2011   . Pacemaker 05/18/2011  . Anemia 05/18/2011  . Acute renal failure (ARF) 05/18/2011  . Pneumonia 05/17/2011  . Fever 05/17/2011  . Atrial fibrillation 05/17/2011  . Hyperglycemia 05/17/2011  . Bradycardia 05/17/2011  . Gout 05/17/2011  . Arthritis 05/17/2011  . Thrombocytopenia 05/17/2011       GP    Yuridiana Formanek 12/18/2013, 3:06 PM

## 2013-12-21 ENCOUNTER — Ambulatory Visit (HOSPITAL_COMMUNITY)
Admission: RE | Admit: 2013-12-21 | Discharge: 2013-12-21 | Disposition: A | Discharge status: Home / Self Care | Payer: Medicare Other | Source: Ambulatory Visit | Attending: Family Medicine | Admitting: Family Medicine

## 2013-12-21 DIAGNOSIS — R262 Difficulty in walking, not elsewhere classified: Secondary | ICD-10-CM

## 2013-12-21 DIAGNOSIS — M25661 Stiffness of right knee, not elsewhere classified: Secondary | ICD-10-CM

## 2013-12-21 NOTE — Progress Notes (Signed)
Physical Therapy Treatment Patient Details  Name: [name] MRN: [id number] Date of Birth: 04-Oct-1934  Today's Date: 12/21/2013 Time: 4098-1191 PT Time Calculation (min): 45 min 1430 - 1445  Manual  1445 - 1500 NMR  1500 - 29 TE   Visit#: 72 of 20  Re-eval: 01/13/14 Assessment Diagnosis: R TKR  Surgical Date: 10/05/13 Next MD Visit: Dr. Maureen Oconnor 12/17/2013  Authorization: Medicare  Authorization Time Period: G code complete 10th visit  Authorization Visit#: 17 of 20   Subjective: Symptoms/Limitations Symptoms: denies pain, states execise reduces stiffness, reports needing balance improvement    Exercise/Treatments Mobility/Balance        Stretches Active Hamstring Stretch: 3 reps;30 seconds;Limitations Aerobic Stationary Bike: 10 min on upright bike for ROM  Standing Theraband Level (Terminal Knee Extension): Level 4 (Blue) Terminal Knee Extension Limitations: 20x    Supine Quad Sets: 15 reps R  Straight Leg Raises: Strengthening;Right;15 reps      Balance Exercises Standing SLS: Eyes open;Intermittent HHA;10 secs   R 2 trials  Balance Beam: CGA: stepping on/off 2 x 10, static stance numberes 1-15, R SLS with  3 way weight shifting vectors 10x      Manual Therapyx 15 min  Joint Mobilization: R knee : AP and PA glides, internal and external rotations glides, PROM flexion, manual gastroc stretch 30 sec x 2, manual hs stretch 30 sec x 2   Physical Therapy Assessment and Plan PT Assessment and Plan Clinical Impression Statement: maintaining ghains in knee AROM, furtehr therapy for ROM and balance/stability for gait and falls prevention  PT Plan: Continue with manual techniques to reduce fascial restrictions to improve ROM, functional strengthening, gait and balance training.  continue X  2-3 vists, dynamic balance, balance with head turns                                                                                                                                                                                                          visits     Goals PT Short Term Goals Time to Complete Short Term Goals: 4 weeks PT Short Term Goal 1: Pt will improve his functional Rt knee AROM 5-105 degrees in order to go from sit to stand with greater ease.  PT Short Term Goal 2: Pt will improve his hip abduction strength to 4/5 to prepare for independent gait with minimal gait impairments.  PT Short Term Goal 2 - Progress: Progressing toward goal PT Short Term Goal 3: Pt will improve his dynamic balance and beign to ambulate independently w/moderate gait impairments.  PT Short Term Goal 3 - Progress: Met PT Long Term Goals Time to Complete Long Term Goals: 8 weeks PT Long Term Goal 1: Pt will improve her FOTO limiation to less than 40% for improved QOL. PT Long Term Goal 1 - Progress: Progressing toward goal PT Long Term Goal 2: Pt will improve his knee AROM 3-110 degrees in order to ambulate with minimal gait impairments independently to decrease risk of secondary injury.  PT Long Term Goal 2 - Progress: Met Long Term Goal 3: Pt will improve his functional strength and perform 5 sit to stands for standard surface without UE A in 12 seconds for normalized age value.  Long Term Goal 3 Progress: Met PT Long Term Goal 5: Pt will maintain full knee extension AROM And dispaly 115 AROM knee flexion - new goal  12/14/2013  Long Term Goal 5 Progress: Progressing toward goal  Problem List Patient Active Problem List   Diagnosis Date Noted  . Difficulty in walking(719.7) 11/03/2013  . Stiffness of right knee 11/03/2013  . Postoperative anemia due to acute blood loss 10/06/2013  . OA (osteoarthritis) of knee 10/05/2013  . Preoperative cardiovascular examination 07/20/2013  . Acute on chronic combined systolic and diastolic CHF (congestive heart failure) 04/29/2013  . Long QT interval 04/21/2013  . Volume overload 04/21/2013  . Long term (current) use of  anticoagulants 01/09/2013  . HTN (hypertension) 05/20/2011  . Pleural effusion 05/20/2011  . Lactic acid acidosis 05/18/2011  . Pacemaker 05/18/2011  . Anemia 05/18/2011  . Acute renal failure (ARF) 05/18/2011  . Pneumonia 05/17/2011  . Fever 05/17/2011  . Atrial fibrillation 05/17/2011  . Hyperglycemia 05/17/2011  . Bradycardia 05/17/2011  . Gout 05/17/2011  . Arthritis 05/17/2011  . Thrombocytopenia 05/17/2011    PT - End of Session Activity Tolerance: Patient tolerated treatment well PT Plan of Care PT Patient Instructions: HEP written provided for balance exercises, educated to have table top for support   GP    [name] 12/21/2013, 3:28 PM

## 2013-12-25 ENCOUNTER — Ambulatory Visit (HOSPITAL_COMMUNITY)
Admission: RE | Admit: 2013-12-25 | Discharge: 2013-12-25 | Disposition: A | Discharge status: Home / Self Care | Payer: Medicare Other | Source: Ambulatory Visit | Attending: Family Medicine | Admitting: Family Medicine

## 2013-12-25 DIAGNOSIS — M25661 Stiffness of right knee, not elsewhere classified: Secondary | ICD-10-CM

## 2013-12-25 DIAGNOSIS — R262 Difficulty in walking, not elsewhere classified: Secondary | ICD-10-CM

## 2013-12-25 NOTE — Progress Notes (Signed)
Physical Therapy Treatment Patient Details  Name: [name] MRN: [id number] Date of Birth: 12/10/1933  Today's Date: 12/25/2013 Time: 9562-1308 PT Time Calculation (min): 39 min Charge: TE [place], NMR [id number]  Visit#: 18 of 20  Re-eval: 01/13/14 Assessment Diagnosis: R TKR  Surgical Date: 10/05/13 Next MD Visit: Dr. Maureen Ralphs   Authorization: Medicare  Authorization Time Period: G code complete 10th visit  Authorization Visit#: 18 of 20   Subjective: Symptoms/Limitations Symptoms: Knee is feeling good, biggest problem currently with balance.   Pain Assessment Currently in Pain?: No/denies  Objective:   Exercise/Treatments Stretches Gastroc Stretch: 3 reps;30 seconds;Limitations Gastroc Stretch Limitations: slant board Aerobic Stationary Bike: 8 min on upright bike for ROM  Standing Functional Squat: 15 reps;Limitations Functional Squat Limitations: Proper lifting 8# box with 15# in box from floor Lunge Walking - Round Trips: 1RT SLS: Rt 36", Lt 32" max of 5 SLS with Vectors: 3x 5" with HHA  Balance Exercises Standing Standing Eyes Opened: Narrow base of support (BOS);Foam;Other reps (comment);Limitations Standing Eyes Opened Limitations: NBOS with pertabation x1 mn and head turns 10x each SLS: Limitations SLS Limitations: Rt 36", Lt 32" max of 5 SLS with Vectors: 3 reps;Limitations SLS with Vectors Limitations: 3x 5" with HHA Balance Beam: Tandem, retro and sidestepping 2RT CGA Gait with Head Turns (Round Trips): 3RT carrying box with head turns   Physical Therapy Assessment and Plan PT Assessment and Plan Clinical Impression Statement: Progressed balance with funcrtional strengthening activties to improve giat and overall stability, pt able to complete wtih min guard with dynamic surface activities and cueing for form with new exercises.  Pt improve Bil LE SLS, progressed to vector stance with 1 HHA. PT Plan: Continue with manual techniques to reduce  fascial restrictions to improve ROM, functional strengthening, gait and balance training.  continue X  2vists, dynamic balance, balance with head turns                                                                                                                                                                                                         visits     Goals PT Short Term Goals Time to Complete Short Term Goals: 4 weeks PT Short Term Goal 1: Pt will improve his functional Rt knee AROM 5-105 degrees in order to go from sit to stand with greater ease.  PT Short Term Goal 2: Pt will improve his hip abduction strength to 4/5 to prepare for independent gait with minimal gait impairments.  PT Short Term Goal 2 - Progress: Progressing toward goal  PT Short Term Goal 3: Pt will improve his dynamic balance and beign to ambulate independently w/moderate gait impairments.  PT Long Term Goals Time to Complete Long Term Goals: 8 weeks PT Long Term Goal 1: Pt will improve her FOTO limiation to less than 40% for improved QOL. PT Long Term Goal 2: Pt will improve his knee AROM 3-110 degrees in order to ambulate with minimal gait impairments independently to decrease risk of secondary injury.  Long Term Goal 3: Pt will improve his functional strength and perform 5 sit to stands for standard surface without UE A in 12 seconds for normalized age value.  PT Long Term Goal 5: Pt will maintain full knee extension AROM And dispaly 115 AROM knee flexion - new goal  12/14/2013   Problem List Patient Active Problem List   Diagnosis Date Noted  . Difficulty in walking(719.7) 11/03/2013  . Stiffness of right knee 11/03/2013  . Postoperative anemia due to acute blood loss 10/06/2013  . OA (osteoarthritis) of knee 10/05/2013  . Preoperative cardiovascular examination 07/20/2013  . Acute on chronic combined systolic and diastolic CHF (congestive heart failure) 04/29/2013  . Long QT interval 04/21/2013  . Volume  overload 04/21/2013  . Long term (current) use of anticoagulants 01/09/2013  . HTN (hypertension) 05/20/2011  . Pleural effusion 05/20/2011  . Lactic acid acidosis 05/18/2011  . Pacemaker 05/18/2011  . Anemia 05/18/2011  . Acute renal failure (ARF) 05/18/2011  . Pneumonia 05/17/2011  . Fever 05/17/2011  . Atrial fibrillation 05/17/2011  . Hyperglycemia 05/17/2011  . Bradycardia 05/17/2011  . Gout 05/17/2011  . Arthritis 05/17/2011  . Thrombocytopenia 05/17/2011    PT - End of Session Equipment Utilized During Treatment: Gait belt Activity Tolerance: Patient tolerated treatment well General Behavior During Therapy: [facility] for tasks assessed/performed  GP    Aldona Lento 12/25/2013, 5:22 PM

## 2013-12-28 ENCOUNTER — Ambulatory Visit (HOSPITAL_COMMUNITY)
Admission: RE | Admit: 2013-12-28 | Discharge: 2013-12-28 | Disposition: A | Discharge status: Home / Self Care | Payer: Medicare Other | Source: Ambulatory Visit | Attending: Family Medicine | Admitting: Family Medicine

## 2013-12-28 DIAGNOSIS — M25661 Stiffness of right knee, not elsewhere classified: Secondary | ICD-10-CM

## 2013-12-28 DIAGNOSIS — R262 Difficulty in walking, not elsewhere classified: Secondary | ICD-10-CM

## 2013-12-28 NOTE — Progress Notes (Signed)
Physical Therapy Treatment Patient Details  Name: [name] MRN: [id number] Date of Birth: [birth year]  Today's Date: 12/28/2013 Time: 4098-1191 PT Time Calculation (min): 81 min Charge: TE [id number], NMR 1308-6578  Visit#: 19 of 20  Re-eval: 01/13/14 Assessment Diagnosis: R TKR  Surgical Date: 10/05/13 Next MD Visit: Dr. Maureen Ralphs May  Authorization: Medicare  Authorization Time Period: G code complete 10th visit  Authorization Visit#: 19 of 20   Subjective: Symptoms/Limitations Symptoms: Pt stated he could not find a parking space close by and had no difficulty walking in today.  Pt proud of AROM 0-115, wishes to focus on balance activties here today.   Pain Assessment Currently in Pain?: No/denies  Objective:   Exercise/Treatments Aerobic Stationary Bike: 8 min on upright bike for ROM   Balance Exercises Standing SLS: Limitations SLS Limitations: Rt 17", Rt 22" max of 3 SLS with Vectors: Limitations SLS with Vectors Limitations: 5x 5" with HHA Balance Beam: Tandem, retro and sidestepping 2RT CGA Gait with Head Turns (Round Trips): 5RT with head turns Step Over Hurdles / Cones: 6 and 12in hurdles alternating 3RT Other Standing Exercises: Warrior I and II 2x 30" each side   Physical Therapy Assessment and Plan PT Assessment and Plan Clinical Impression Statement: Improved stabilty with dynamic balance activities today, SBA wtih all balance beam activities.  Added warrior pose to continue balance stability and hurdles to encourage knee flexion with gait.   PT Plan: Re-assess next session.  Continue to mprove AROM, functional strengthening, gait and dynamic balance training with head turns.    Goals PT Short Term Goals Time to Complete Short Term Goals: 4 weeks PT Short Term Goal 1: Pt will improve his functional Rt knee AROM 5-105 degrees in order to go from sit to stand with greater ease.  PT Short Term Goal 2: Pt will improve his hip abduction strength to 4/5 to  prepare for independent gait with minimal gait impairments.  PT Short Term Goal 2 - Progress: Progressing toward goal PT Short Term Goal 3: Pt will improve his dynamic balance and beign to ambulate independently w/moderate gait impairments.  PT Long Term Goals Time to Complete Long Term Goals: 8 weeks PT Long Term Goal 1: Pt will improve her FOTO limiation to less than 40% for improved QOL. PT Long Term Goal 2: Pt will improve his knee AROM 3-110 degrees in order to ambulate with minimal gait impairments independently to decrease risk of secondary injury.  Long Term Goal 3: Pt will improve his functional strength and perform 5 sit to stands for standard surface without UE A in 12 seconds for normalized age value.  Long Term Goal 4 Progress: Progressing toward goal PT Long Term Goal 5: Pt will maintain full knee extension AROM And dispaly 115 AROM knee flexion - new goal  12/14/2013  Long Term Goal 5 Progress: Met  Problem List Patient Active Problem List   Diagnosis Date Noted  . Difficulty in walking(719.7) 11/03/2013  . Stiffness of right knee 11/03/2013  . Postoperative anemia due to acute blood loss 10/06/2013  . OA (osteoarthritis) of knee 10/05/2013  . Preoperative cardiovascular examination 07/20/2013  . Acute on chronic combined systolic and diastolic CHF (congestive heart failure) 04/29/2013  . Long QT interval 04/21/2013  . Volume overload 04/21/2013  . Long term (current) use of anticoagulants 01/09/2013  . HTN (hypertension) 05/20/2011  . Pleural effusion 05/20/2011  . Lactic acid acidosis 05/18/2011  . Pacemaker 05/18/2011  . Anemia 05/18/2011  . Acute  renal failure (ARF) 05/18/2011  . Pneumonia 05/17/2011  . Fever 05/17/2011  . Atrial fibrillation 05/17/2011  . Hyperglycemia 05/17/2011  . Bradycardia 05/17/2011  . Gout 05/17/2011  . Arthritis 05/17/2011  . Thrombocytopenia 05/17/2011    PT - End of Session Equipment Utilized During Treatment: Gait belt Activity  Tolerance: Patient tolerated treatment well General Behavior During Therapy: [facility] for tasks assessed/performed  GP    Aldona Lento 12/28/2013, 3:23 PM

## 2014-01-01 ENCOUNTER — Ambulatory Visit (HOSPITAL_COMMUNITY)
Admission: RE | Admit: 2014-01-01 | Discharge: 2014-01-01 | Disposition: A | Discharge status: Home / Self Care | Payer: Medicare Other | Source: Ambulatory Visit | Attending: Family Medicine | Admitting: Family Medicine

## 2014-01-01 DIAGNOSIS — M25661 Stiffness of right knee, not elsewhere classified: Secondary | ICD-10-CM

## 2014-01-01 DIAGNOSIS — R262 Difficulty in walking, not elsewhere classified: Secondary | ICD-10-CM

## 2014-01-01 NOTE — Evaluation (Signed)
Physical Therapy Discharge Summary   Patient Details  Name: [name] MRN: [id number] Date of Birth: [birth year]  Today's Date: 01/01/2014 Time: 1430-1505 PT Time Calculation (min): 35 min   TE 30 min            Visit#: 20 of 20  Re-eval:   Assessment Diagnosis: R TKR  Surgical Date: 10/05/13 Next MD Visit: Dr. Maureen Ralphs May  Authorization: Medicare    Authorization Time Period:    Authorization Visit#: 38 of 20   Past Medical History:  Past Medical History  Diagnosis Date  . Pacemaker     medtronic  . CAD (coronary artery disease)     echo 09-05-2010-EF nl, mod-severe dilated Left atrium; myoview 02-08-12-EF 52%, no ischemia  . HTN (hypertension)   . Barrett's esophagus   . Gout   . Arthritis   . History of cardioversion 02/09/13    electrical synchronized cardioversion, on flecainide and warfarin  . H/O transesophageal echocardiography (TEE) for monitoring 09/05/10    tee guided AT pace termination, did not proceed with cardioversion due to termination of the atrial flutter through his pacemaker  . Atrial fibrillation   . History of stomach ulcers     many yrs ago  . GERD (gastroesophageal reflux disease)   . History of kidney stones     per pt - no actual diagnosisi  . Cancer     prostate - radiation only  . Pacemaker 09-25-13  . Prostate cancer 09-25-13    tx. with radiation only- dx. 2013  . Diabetes mellitus without complication     "prediabetic"-no oral meds   Past Surgical History:  Past Surgical History  Procedure Laterality Date  . Pacemaker insertion      medtronic adapta- Dr. Sallyanne Kuster follows- LOV 9'14  . Tonsillectomy    . Appendectomy    . Cardioversion N/A 02/09/2013    Procedure: CARDIOVERSION;  Surgeon: Sanda Klein, MD;  Location: [facility];  Service: Cardiovascular;  Laterality: N/A;  . Cardiac catheterization  02/18/06    EF 65%, focal napkin ring-like lesion into the prox LAD otherwise normal coronaries  . Total knee arthroplasty Right  10/05/2013    Procedure: RIGHT TOTAL KNEE ARTHROPLASTY;  Surgeon: Gearlean Alf, MD;  Location: [facility] OR[facility];  Service: Orthopedics;  Laterality: Right;    Subjective Symptoms/Limitations Symptoms: doing well, ready for discharge , denies pain, performing home program  How long can you sit comfortably?: Able to sit comfortably through church services for 50-60 minutes. How long can you walk comfortably?: 30 min no device    Cognition/Observation Observation/Other Assessments Observations: gait no device, L LE antalgia   Sensation/Coordination/Flexibility/Functional Tests Functional Tests Functional Tests: FOTO  score 64  Assessment RLE AROM (degrees) Right Knee Extension: 0, initial 15  Right Knee Flexion: 115, initial 85 RLE Strength Right Knee Flexion: 5/5 Right Knee Extension: 5/5  Exercise/Treatment    Stretches Active Hamstring Stretch: 3 reps;30 seconds;Limitations Gastroc Stretch: 3 reps;30 seconds;Limitations Gastroc Stretch Limitations: slant board Aerobic Stationary Bike: 8 min on upright bike for ROM  Machines for Strengthening Cybex Knee Flexion: R plate 3 2 x 10  Cybex Leg Press: R 36# 20x    Seated Heel Slides: AROM;10 reps Supine Quad Sets: 15 reps  Physical Therapy Assessment and Plan PT Assessment and Plan Clinical Impression Statement: goals met, left knee DJD causing gait deviations and pain  PT Plan: discharge     Goals PT Short Term Goals Time to Complete Short Term Goals: 4  weeks PT Short Term Goal 1: Pt will improve his functional Rt knee AROM 5-105 degrees in order to go from sit to stand with greater ease.  PT Short Term Goal 1 - Progress: Met PT Short Term Goal 2: Pt will improve his hip abduction strength to 4/5 to prepare for independent gait with minimal gait impairments.  PT Short Term Goal 2 - Progress: Met PT Short Term Goal 3: Pt will improve his dynamic balance and beign to ambulate independently w/moderate gait impairments.  PT  Short Term Goal 3 - Progress: Met PT Long Term Goals Time to Complete Long Term Goals: 8 weeks PT Long Term Goal 1: Pt will improve her FOTO limiation to less than 40% for improved QOL. PT Long Term Goal 1 - Progress: Met PT Long Term Goal 2: Pt will improve his knee AROM 3-110 degrees in order to ambulate with minimal gait impairments independently to decrease risk of secondary injury.  PT Long Term Goal 2 - Progress: Met Long Term Goal 3: Pt will improve his functional strength and perform 5 sit to stands for standard surface without UE A in 12 seconds for normalized age value.  Long Term Goal 3 Progress: Met Long Term Goal 4: Pt will improve his strength to [facility] in order to ascend and desecnd 5 steps with 1 handrail with alternating pattern in order to safely enter community dwellings.  Long Term Goal 4 Progress: Met PT Long Term Goal 5: Pt will maintain full knee extension AROM And dispaly 115 AROM knee flexion - new goal  12/14/2013  Long Term Goal 5 Progress: Met  Problem List Patient Active Problem List   Diagnosis Date Noted  . Difficulty in walking(719.7) 11/03/2013  . Stiffness of right knee 11/03/2013  . Postoperative anemia due to acute blood loss 10/06/2013  . OA (osteoarthritis) of knee 10/05/2013  . Preoperative cardiovascular examination 07/20/2013  . Acute on chronic combined systolic and diastolic CHF (congestive heart failure) 04/29/2013  . Long QT interval 04/21/2013  . Volume overload 04/21/2013  . Long term (current) use of anticoagulants 01/09/2013  . HTN (hypertension) 05/20/2011  . Pleural effusion 05/20/2011  . Lactic acid acidosis 05/18/2011  . Pacemaker 05/18/2011  . Anemia 05/18/2011  . Acute renal failure (ARF) 05/18/2011  . Pneumonia 05/17/2011  . Fever 05/17/2011  . Atrial fibrillation 05/17/2011  . Hyperglycemia 05/17/2011  . Bradycardia 05/17/2011  . Gout 05/17/2011  . Arthritis 05/17/2011  . Thrombocytopenia 05/17/2011    Patient agreed with  discharge and plan of care   GP Functional Assessment Tool Used: FOTO/clinical judgement   todays score status 64%, limits 36% Functional Limitation: Mobility: Walking and moving around Mobility: Walking and Moving Around Goal Status [phone]): At least 40 percent but less than 60 percent impaired, limited or restricted Mobility: Walking and Moving Around Discharge Status [phone]): At least 20 percent but less than 40 percent impaired, limited or restricted  [name] 01/01/2014, 3:29 PM  Physician Documentation Your signature is required to indicate approval of the treatment plan as stated above.  Please sign and either send electronically or make a copy of this report for your files and return this physician signed original.   Please mark one 1.__approve of plan  2. ___approve of plan with the following conditions.   ______________________________  _____________________ Physician Signature                                                                                                             Date

## 2014-01-05 ENCOUNTER — Other Ambulatory Visit: Payer: Self-pay | Admitting: Cardiovascular Disease

## 2014-01-05 ENCOUNTER — Ambulatory Visit: Payer: Medicare Other | Admitting: Pharmacist Clinician (PhC)/ Clinical Pharmacy Specialist

## 2014-01-05 NOTE — Telephone Encounter (Signed)
Needs a new prescription for his Warfrain  sent to [facility] [address] in [place] . They are asking for a new prescription.

## 2014-01-05 NOTE — Telephone Encounter (Signed)
Rx was sent to pharmacy electronically. 

## 2014-01-06 ENCOUNTER — Other Ambulatory Visit: Payer: Self-pay | Admitting: Pharmacist Clinician (PhC)/ Clinical Pharmacy Specialist

## 2014-01-06 MED ORDER — WARFARIN SODIUM 2.5 MG PO TABS: ORAL_TABLET | ORAL | Status: DC

## 2014-01-07 ENCOUNTER — Other Ambulatory Visit: Payer: Self-pay | Admitting: Pharmacist Clinician (PhC)/ Clinical Pharmacy Specialist

## 2014-01-07 ENCOUNTER — Other Ambulatory Visit: Payer: Self-pay | Admitting: *Deleted

## 2014-01-07 MED ORDER — WARFARIN SODIUM 2.5 MG PO TABS: ORAL_TABLET | ORAL | Status: DC

## 2014-01-22 ENCOUNTER — Ambulatory Visit: Payer: Medicare Other | Admitting: Pharmacist Clinician (PhC)/ Clinical Pharmacy Specialist

## 2014-01-27 ENCOUNTER — Ambulatory Visit (INDEPENDENT_AMBULATORY_CARE_PROVIDER_SITE_OTHER): Payer: Medicare Other | Admitting: Pharmacist Clinician (PhC)/ Clinical Pharmacy Specialist

## 2014-01-27 DIAGNOSIS — Z7901 Long term (current) use of anticoagulants: Secondary | ICD-10-CM | POA: Diagnosis not present

## 2014-01-27 DIAGNOSIS — I4891 Unspecified atrial fibrillation: Secondary | ICD-10-CM

## 2014-01-27 LAB — POCT INR: INR: 1.5

## 2014-02-19 ENCOUNTER — Ambulatory Visit (INDEPENDENT_AMBULATORY_CARE_PROVIDER_SITE_OTHER): Payer: Medicare Other | Admitting: Pharmacist Clinician (PhC)/ Clinical Pharmacy Specialist

## 2014-02-19 DIAGNOSIS — I4891 Unspecified atrial fibrillation: Secondary | ICD-10-CM | POA: Diagnosis not present

## 2014-02-19 DIAGNOSIS — Z7901 Long term (current) use of anticoagulants: Secondary | ICD-10-CM | POA: Diagnosis not present

## 2014-02-19 LAB — POCT INR: INR: 1.6

## 2014-03-17 DIAGNOSIS — H1130 Conjunctival hemorrhage, unspecified eye: Secondary | ICD-10-CM | POA: Diagnosis not present

## 2014-03-24 ENCOUNTER — Ambulatory Visit (INDEPENDENT_AMBULATORY_CARE_PROVIDER_SITE_OTHER): Payer: Medicare Other | Admitting: Pharmacist Clinician (PhC)/ Clinical Pharmacy Specialist

## 2014-03-24 DIAGNOSIS — Z7901 Long term (current) use of anticoagulants: Secondary | ICD-10-CM | POA: Diagnosis not present

## 2014-03-24 DIAGNOSIS — I4891 Unspecified atrial fibrillation: Secondary | ICD-10-CM

## 2014-03-24 LAB — POCT INR: INR: 2

## 2014-04-01 DIAGNOSIS — Z96659 Presence of unspecified artificial knee joint: Secondary | ICD-10-CM | POA: Diagnosis not present

## 2014-04-06 ENCOUNTER — Encounter: Payer: Self-pay | Admitting: Cardiovascular Disease

## 2014-04-06 ENCOUNTER — Ambulatory Visit (INDEPENDENT_AMBULATORY_CARE_PROVIDER_SITE_OTHER): Payer: Medicare Other | Admitting: *Deleted

## 2014-04-06 DIAGNOSIS — I4891 Unspecified atrial fibrillation: Secondary | ICD-10-CM | POA: Diagnosis not present

## 2014-04-06 DIAGNOSIS — I498 Other specified cardiac arrhythmias: Secondary | ICD-10-CM | POA: Diagnosis not present

## 2014-04-07 NOTE — Progress Notes (Signed)
Remote pacemaker transmission.   

## 2014-04-09 LAB — MDC_IDC_ENUM_SESS_TYPE_REMOTE
Battery Impedance: 4204 Ohm
Battery Voltage: 2.67 V
Brady Statistic AP VP Percent: 0 %
Brady Statistic AP VS Percent: 92 %
Brady Statistic AS VS Percent: 7 %
Date Time Interrogation Session: 20150602130610
Lead Channel Impedance Value: 505 Ohm
Lead Channel Pacing Threshold Amplitude: 0.5 V
Lead Channel Pacing Threshold Pulse Width: 0.4 ms
Lead Channel Pacing Threshold Pulse Width: 0.4 ms
Lead Channel Setting Pacing Amplitude: 1.5 V
Lead Channel Setting Pacing Amplitude: 2.5 V
Lead Channel Setting Pacing Pulse Width: 0.52 ms
MDC IDC MSMT BATTERY REMAINING LONGEVITY: 8 mo
MDC IDC MSMT LEADCHNL RV IMPEDANCE VALUE: 656 Ohm
MDC IDC MSMT LEADCHNL RV PACING THRESHOLD AMPLITUDE: 1 V
MDC IDC SET LEADCHNL RV SENSING SENSITIVITY: 5.6 mV
MDC IDC STAT BRADY AS VP PERCENT: 0 %

## 2014-04-26 ENCOUNTER — Other Ambulatory Visit: Payer: Self-pay | Admitting: *Deleted

## 2014-04-26 MED ORDER — ALLOPURINOL 100 MG PO TABS: 100.0000 mg | ORAL_TABLET | Freq: Every day | ORAL | Status: DC

## 2014-04-27 ENCOUNTER — Other Ambulatory Visit: Payer: Self-pay | Admitting: *Deleted

## 2014-04-27 MED ORDER — ALLOPURINOL 100 MG PO TABS: 100.0000 mg | ORAL_TABLET | Freq: Every day | ORAL | Status: DC

## 2014-04-27 NOTE — Telephone Encounter (Signed)
Rx was sent to pharmacy electronically. 

## 2014-04-28 ENCOUNTER — Ambulatory Visit (INDEPENDENT_AMBULATORY_CARE_PROVIDER_SITE_OTHER): Payer: Medicare Other | Admitting: Pharmacist Clinician (PhC)/ Clinical Pharmacy Specialist

## 2014-04-28 DIAGNOSIS — Z7901 Long term (current) use of anticoagulants: Secondary | ICD-10-CM

## 2014-04-28 DIAGNOSIS — I4891 Unspecified atrial fibrillation: Secondary | ICD-10-CM | POA: Diagnosis not present

## 2014-04-28 LAB — POCT INR: INR: 2.6

## 2014-05-04 ENCOUNTER — Encounter: Payer: Self-pay | Admitting: Cardiology

## 2014-05-26 ENCOUNTER — Ambulatory Visit (INDEPENDENT_AMBULATORY_CARE_PROVIDER_SITE_OTHER): Payer: Medicare Other | Admitting: Pharmacist Clinician (PhC)/ Clinical Pharmacy Specialist

## 2014-05-26 DIAGNOSIS — I4891 Unspecified atrial fibrillation: Secondary | ICD-10-CM

## 2014-05-26 DIAGNOSIS — Z7901 Long term (current) use of anticoagulants: Secondary | ICD-10-CM | POA: Diagnosis not present

## 2014-05-26 LAB — POCT INR: INR: 1.5

## 2014-06-23 ENCOUNTER — Other Ambulatory Visit: Payer: Self-pay | Admitting: Cardiovascular Disease

## 2014-06-23 ENCOUNTER — Ambulatory Visit (INDEPENDENT_AMBULATORY_CARE_PROVIDER_SITE_OTHER): Payer: Medicare Other | Admitting: Pharmacist Clinician (PhC)/ Clinical Pharmacy Specialist

## 2014-06-23 DIAGNOSIS — I4891 Unspecified atrial fibrillation: Secondary | ICD-10-CM

## 2014-06-23 DIAGNOSIS — Z7901 Long term (current) use of anticoagulants: Secondary | ICD-10-CM | POA: Diagnosis not present

## 2014-06-23 LAB — POCT INR: INR: 2

## 2014-06-23 NOTE — Telephone Encounter (Signed)
Rx was sent to pharmacy electronically. 

## 2014-07-02 ENCOUNTER — Other Ambulatory Visit: Payer: Self-pay | Admitting: Cardiovascular Disease

## 2014-07-02 NOTE — Telephone Encounter (Signed)
Rx was sent to pharmacy electronically. 

## 2014-07-08 DIAGNOSIS — R609 Edema, unspecified: Secondary | ICD-10-CM | POA: Diagnosis not present

## 2014-07-08 DIAGNOSIS — Z6830 Body mass index (BMI) 30.0-30.9, adult: Secondary | ICD-10-CM | POA: Diagnosis not present

## 2014-07-08 DIAGNOSIS — I8310 Varicose veins of unspecified lower extremity with inflammation: Secondary | ICD-10-CM | POA: Diagnosis not present

## 2014-07-21 ENCOUNTER — Ambulatory Visit: Payer: Medicare Other | Admitting: Pharmacist Clinician (PhC)/ Clinical Pharmacy Specialist

## 2014-07-21 ENCOUNTER — Ambulatory Visit (INDEPENDENT_AMBULATORY_CARE_PROVIDER_SITE_OTHER): Payer: Medicare Other | Admitting: Pharmacist Clinician (PhC)/ Clinical Pharmacy Specialist

## 2014-07-21 ENCOUNTER — Ambulatory Visit (INDEPENDENT_AMBULATORY_CARE_PROVIDER_SITE_OTHER): Payer: Medicare Other | Admitting: Cardiovascular Disease

## 2014-07-21 ENCOUNTER — Encounter: Payer: Self-pay | Admitting: Cardiovascular Disease

## 2014-07-21 VITALS — BP 146/90 | HR 65 | Resp 16 | Ht 66.0 in | Wt 196.5 lb

## 2014-07-21 DIAGNOSIS — Z7901 Long term (current) use of anticoagulants: Secondary | ICD-10-CM | POA: Diagnosis not present

## 2014-07-21 DIAGNOSIS — Z79899 Other long term (current) drug therapy: Secondary | ICD-10-CM

## 2014-07-21 DIAGNOSIS — I498 Other specified cardiac arrhythmias: Secondary | ICD-10-CM

## 2014-07-21 DIAGNOSIS — Z45018 Encounter for adjustment and management of other part of cardiac pacemaker: Secondary | ICD-10-CM | POA: Diagnosis not present

## 2014-07-21 DIAGNOSIS — I4891 Unspecified atrial fibrillation: Secondary | ICD-10-CM

## 2014-07-21 DIAGNOSIS — R5383 Other fatigue: Secondary | ICD-10-CM | POA: Diagnosis not present

## 2014-07-21 DIAGNOSIS — R5381 Other malaise: Secondary | ICD-10-CM | POA: Diagnosis not present

## 2014-07-21 DIAGNOSIS — I5043 Acute on chronic combined systolic (congestive) and diastolic (congestive) heart failure: Secondary | ICD-10-CM

## 2014-07-21 DIAGNOSIS — I509 Heart failure, unspecified: Secondary | ICD-10-CM

## 2014-07-21 DIAGNOSIS — Z45010 Encounter for checking and testing of cardiac pacemaker pulse generator [battery]: Secondary | ICD-10-CM

## 2014-07-21 DIAGNOSIS — D689 Coagulation defect, unspecified: Secondary | ICD-10-CM | POA: Diagnosis not present

## 2014-07-21 DIAGNOSIS — R001 Bradycardia, unspecified: Secondary | ICD-10-CM

## 2014-07-21 DIAGNOSIS — I4819 Other persistent atrial fibrillation: Secondary | ICD-10-CM

## 2014-07-21 LAB — POCT INR: INR: 2.5

## 2014-07-21 NOTE — Patient Instructions (Signed)
Your Pacemaker generator battery is depleting and needs to be changed [place] .  This will be done at [place] Sunday AND Monday - PROCEDURE Tuesday.  Your physician recommends that you return for lab work in: [place].  Return in one week after the procedure for a wound check and INR.

## 2014-07-22 ENCOUNTER — Other Ambulatory Visit: Payer: Self-pay | Admitting: *Deleted

## 2014-07-22 ENCOUNTER — Encounter (HOSPITAL_COMMUNITY): Payer: Self-pay | Admitting: Pharmacy Technician

## 2014-07-22 DIAGNOSIS — Z45010 Encounter for checking and testing of cardiac pacemaker pulse generator [battery]: Secondary | ICD-10-CM

## 2014-07-22 LAB — COMPREHENSIVE METABOLIC PANEL
ALK PHOS: 42 U/L (ref 39–117)
ALT: 13 U/L (ref 0–53)
AST: 17 U/L (ref 0–37)
Albumin: 3.7 g/dL (ref 3.5–5.2)
BILIRUBIN TOTAL: 0.7 mg/dL (ref 0.2–1.2)
BUN: 33 mg/dL — AB (ref 6–23)
CO2: 23 mEq/L (ref 19–32)
CREATININE: 1.23 mg/dL (ref 0.50–1.35)
Calcium: 8.7 mg/dL (ref 8.4–10.5)
Chloride: 110 mEq/L (ref 96–112)
GLUCOSE: 101 mg/dL — AB (ref 70–99)
Potassium: 4.5 mEq/L (ref 3.5–5.3)
Sodium: 143 mEq/L (ref 135–145)
Total Protein: 6.1 g/dL (ref 6.0–8.3)

## 2014-07-22 LAB — CBC
HEMATOCRIT: 37.6 % — AB (ref 39.0–52.0)
HEMOGLOBIN: 12.3 g/dL — AB (ref 13.0–17.0)
MCH: 29.4 pg (ref 26.0–34.0)
MCHC: 32.7 g/dL (ref 30.0–36.0)
MCV: 89.7 fL (ref 78.0–100.0)
Platelets: 197 10*3/uL (ref 150–400)
RBC: 4.19 MIL/uL — ABNORMAL LOW (ref 4.22–5.81)
RDW: 14.4 % (ref 11.5–15.5)
WBC: 5.1 10*3/uL (ref 4.0–10.5)

## 2014-07-22 LAB — APTT: aPTT: 46 seconds — ABNORMAL HIGH (ref 24–37)

## 2014-07-22 LAB — PROTIME-INR
INR: 2.33 — AB (ref <1.50)
Prothrombin Time: 25.6 seconds — ABNORMAL HIGH (ref 11.6–15.2)

## 2014-07-26 ENCOUNTER — Encounter: Payer: Self-pay | Admitting: Cardiovascular Disease

## 2014-07-26 MED ORDER — CEFAZOLIN SODIUM-DEXTROSE 2-3 GM-% IV SOLR: 2.0000 g | INTRAVENOUS | Status: DC

## 2014-07-26 MED ORDER — SODIUM CHLORIDE 0.9 % IR SOLN
80.0000 mg | Status: DC
  Filled 2014-07-26: qty 2

## 2014-07-26 NOTE — Progress Notes (Signed)
Patient ID: [name], male   DOB: Jul 29, 1934, 80 y.o.   MRN: [id number]     Reason for office visit Weakness and dyspnea Pacemaker at [facility]   [name] has recently had worsening pedal edema and dyspnea. Despite improvement with diuretics, he continues to have problems with weakness and dyspnea. Interrogation of his pacemaker shows that the device reached elective replacement indicator about 2 weeks ago. His device has switched to VVI mode, which may explain at least part of his symptoms. He has preserved left ventricular systolic function with an ejection fraction of around 50-55% and did not have ischemia by his most recent nuclear stress test. He has a long-standing history of symptomatic persistent atrial fibrillation. For many years this was well controlled with flecainide, but recently this was discontinued due to excessive QRS prolongation, congestive heart failure and depressed systolic function. He is on chronic warfarin anticoagulation and has not had bleeding complications or stroke. In the past has had successful cardioversion as well as pace-termination of atrial flutter via his pacemaker. The device is a dual chamber Medtronic pacemaker  Allergies  Allergen Reactions  . No Known Allergies     Current Outpatient Prescriptions  Medication Sig Dispense Refill  . acetaminophen (TYLENOL) 650 MG CR tablet Take 650 mg by mouth every 8 (eight) hours as needed for pain.       Marland Kitchen allopurinol (ZYLOPRIM) 100 MG tablet Take 1 tablet (100 mg total) by mouth at bedtime.  90 tablet  0  . calcium carbonate (OS-CAL) 600 MG TABS tablet Take 600 mg by mouth daily with breakfast.      . metoprolol succinate (TOPROL-XL) 50 MG 24 hr tablet Take 1 tablet (50 mg total) by mouth daily. <please schedule appointment for future refills>  90 tablet  0  . Misc Natural Products (TART CHERRY ADVANCED PO) Take 1,200 mg by mouth daily.       . Omega-3 Fatty Acids (OMEGA 3 PO) Take 90 mg by mouth daily.       .  pantoprazole (PROTONIX) 40 MG tablet Take 1 tablet (40 mg total) by mouth daily. <please schedule appointment for future refills>  90 tablet  0  . Potassium 99 MG TABS Take 1 tablet by mouth daily.      Marland Kitchen triamcinolone cream (KENALOG) 0.1 % Apply 1 application topically daily as needed (dry skin).       . furosemide (LASIX) 20 MG tablet Take 20 mg by mouth daily.      Marland Kitchen warfarin (COUMADIN) 2.5 MG tablet Take 2.5 mg by mouth daily.       No current facility-administered medications for this visit.    Past Medical History  Diagnosis Date  . Pacemaker     medtronic  . CAD (coronary artery disease)     echo 09-05-2010-EF nl, mod-severe dilated Left atrium; myoview 02-08-12-EF 52%, no ischemia  . HTN (hypertension)   . Barrett's esophagus   . Gout   . Arthritis   . History of cardioversion 02/09/13    electrical synchronized cardioversion, on flecainide and warfarin  . H/O transesophageal echocardiography (TEE) for monitoring 09/05/10    tee guided AT pace termination, did not proceed with cardioversion due to termination of the atrial flutter through his pacemaker  . Atrial fibrillation   . History of stomach ulcers     many yrs ago  . GERD (gastroesophageal reflux disease)   . History of kidney stones     per pt - no  actual diagnosisi  . Cancer     prostate - radiation only  . Pacemaker 09-25-13  . Prostate cancer 09-25-13    tx. with radiation only- dx. 2013  . Diabetes mellitus without complication     "prediabetic"-no oral meds    Past Surgical History  Procedure Laterality Date  . Pacemaker insertion      medtronic adapta- Dr. Sallyanne Kuster follows- LOV 9'14  . Tonsillectomy    . Appendectomy    . Cardioversion N/A 02/09/2013    Procedure: CARDIOVERSION;  Surgeon: Sanda Klein, MD;  Location: [facility];  Service: Cardiovascular;  Laterality: N/A;  . Cardiac catheterization  02/18/06    EF 65%, focal napkin ring-like lesion into the prox LAD otherwise normal coronaries  .  Total knee arthroplasty Right 10/05/2013    Procedure: RIGHT TOTAL KNEE ARTHROPLASTY;  Surgeon: Gearlean Alf, MD;  Location: [facility];  Service: Orthopedics;  Laterality: Right;    Family History  Problem Relation Age of Onset  . Diabetes Mother   . Diabetes Brother     History   Social History  . Marital Status: Married    Spouse Name: N/A    Number of Children: N/A  . Years of Education: N/A   Occupational History  . Not on file.   Social History Main Topics  . Smoking status: Former Smoker    Quit date: 07/22/1970  . Smokeless tobacco: Never Used     Comment: Quit in the 49s  . Alcohol Use: No  . Drug Use: No  . Sexual Activity: Not Currently   Other Topics Concern  . Not on file   Social History Narrative  . No narrative on file    Review of systems: He has weakness dyspnea on exertion, no syncope The patient specifically denies any chest pain at rest or with exertion, dyspnea at rest, orthopnea, paroxysmal nocturnal dyspnea, syncope, palpitations, focal neurological deficits, intermittent claudication, lower extremity edema, unexplained weight gain, cough, hemoptysis or wheezing.  The patient also denies abdominal pain, nausea, vomiting, dysphagia, diarrhea, constipation, polyuria, polydipsia, dysuria, hematuria, frequency, urgency, abnormal bleeding or bruising, fever, chills, unexpected weight changes, mood swings, change in skin or hair texture, change in voice quality, auditory or visual problems, allergic reactions or rashes, new musculoskeletal complaints other than usual "aches and pains".   PHYSICAL EXAM BP 146/90  Pulse 65  Resp 16  Ht 5\' 6"  (1.676 m)  Wt 89.132 kg (196 lb 8 oz)  BMI 31.73 kg/m2  General: Alert, oriented x3, no distress Head: no evidence of trauma, PERRL, EOMI, no exophtalmos or lid lag, no myxedema, no xanthelasma; normal ears, nose and oropharynx Neck: normal jugular venous pulsations and no hepatojugular reflux; brisk carotid  pulses without delay and no carotid bruits Chest: clear to auscultation, no signs of consolidation by percussion or palpation, normal fremitus, symmetrical and full respiratory excursions. Normal pacemaker site Cardiovascular: normal position and quality of the apical impulse, regular rhythm, normal first and paradoxically split second heart sounds, no murmurs, rubs or gallops Abdomen: no tenderness or distention, no masses by palpation, no abnormal pulsatility or arterial bruits, normal bowel sounds, no hepatosplenomegaly Extremities: no clubbing, cyanosis or edema; 2+ radial, ulnar and brachial pulses bilaterally; 2+ right femoral, posterior tibial and dorsalis pedis pulses; 2+ left femoral, posterior tibial and dorsalis pedis pulses; no subclavian or femoral bruits Neurological: grossly nonfocal   EKG: Asynchronous VVI pacing at 65 beats per minutes with retrograde P waves  BMET    Component  Value Date/Time   NA 143 07/21/2014 1612   K 4.5 07/21/2014 1612   CL 110 07/21/2014 1612   CO2 23 07/21/2014 1612   GLUCOSE 101* 07/21/2014 1612   BUN 33* 07/21/2014 1612   CREATININE 1.23 07/21/2014 1612   CREATININE 1.15 10/07/2013 0452   CALCIUM 8.7 07/21/2014 1612   GFRNONAA 60* 10/07/2013 0452   GFRAA 69* 10/07/2013 0452     ASSESSMENT AND PLAN  [name] has mild worsening of chronic diastolic heart failure but to a synchronous ventricular pacing after his pacemaker reached elective replacement indicator. Unfortunately the device cannot be reprogrammed to demand dual-chamber pacing. We'll schedule him for a pacemaker generator change out as soon as possible. Hopefully he will not develop atrial fibrillation between now and then. This procedure has been fully reviewed with the patient and informed consent has been obtained.   Patient Instructions  Your Pacemaker generator battery is depleting and needs to be changed [place] .  This will be done at [place]  Sunday AND Monday - PROCEDURE Tuesday.  Your physician recommends that you return for lab work in: [place].  Return in one week after the procedure for a wound check and INR.        Orders Placed This Encounter  Procedures  . APTT  . CBC  . Comprehensive metabolic panel  . Protime-INR  . EKG 12-Lead  . PACEMAKER GENERATOR CHANGE   Meds ordered this encounter  Medications  . triamcinolone cream (KENALOG) 0.1 %    Sig: Apply 1 application topically daily as needed (dry skin).     Holli Humbles, MD, [place] [phone] office [phone] pager

## 2014-07-27 ENCOUNTER — Ambulatory Visit (HOSPITAL_COMMUNITY)
Admission: RE | Admit: 2014-07-27 | Discharge: 2014-07-27 | Disposition: A | Discharge status: Home / Self Care | Payer: Medicare Other | Source: Ambulatory Visit | Attending: Cardiovascular Disease | Admitting: Cardiovascular Disease

## 2014-07-27 ENCOUNTER — Encounter (HOSPITAL_COMMUNITY)
Admission: RE | Disposition: A | Discharge status: Home / Self Care | Payer: Self-pay | Source: Ambulatory Visit | Attending: Cardiovascular Disease

## 2014-07-27 DIAGNOSIS — I4891 Unspecified atrial fibrillation: Secondary | ICD-10-CM | POA: Diagnosis not present

## 2014-07-27 DIAGNOSIS — E119 Type 2 diabetes mellitus without complications: Secondary | ICD-10-CM | POA: Insufficient documentation

## 2014-07-27 DIAGNOSIS — I2510 Atherosclerotic heart disease of native coronary artery without angina pectoris: Secondary | ICD-10-CM | POA: Diagnosis not present

## 2014-07-27 DIAGNOSIS — Z7901 Long term (current) use of anticoagulants: Secondary | ICD-10-CM | POA: Diagnosis not present

## 2014-07-27 DIAGNOSIS — I97190 Other postprocedural cardiac functional disturbances following cardiac surgery: Secondary | ICD-10-CM

## 2014-07-27 DIAGNOSIS — K2270 Barrett's esophagus without dysplasia: Secondary | ICD-10-CM | POA: Insufficient documentation

## 2014-07-27 DIAGNOSIS — Z87891 Personal history of nicotine dependence: Secondary | ICD-10-CM | POA: Insufficient documentation

## 2014-07-27 DIAGNOSIS — Z45018 Encounter for adjustment and management of other part of cardiac pacemaker: Secondary | ICD-10-CM | POA: Insufficient documentation

## 2014-07-27 DIAGNOSIS — I10 Essential (primary) hypertension: Secondary | ICD-10-CM | POA: Diagnosis not present

## 2014-07-27 DIAGNOSIS — M129 Arthropathy, unspecified: Secondary | ICD-10-CM | POA: Insufficient documentation

## 2014-07-27 DIAGNOSIS — K219 Gastro-esophageal reflux disease without esophagitis: Secondary | ICD-10-CM | POA: Diagnosis not present

## 2014-07-27 DIAGNOSIS — M109 Gout, unspecified: Secondary | ICD-10-CM | POA: Diagnosis not present

## 2014-07-27 DIAGNOSIS — I509 Heart failure, unspecified: Secondary | ICD-10-CM | POA: Insufficient documentation

## 2014-07-27 DIAGNOSIS — I495 Sick sinus syndrome: Secondary | ICD-10-CM | POA: Diagnosis not present

## 2014-07-27 DIAGNOSIS — Z45010 Encounter for checking and testing of cardiac pacemaker pulse generator [battery]: Secondary | ICD-10-CM

## 2014-07-27 DIAGNOSIS — Z8546 Personal history of malignant neoplasm of prostate: Secondary | ICD-10-CM | POA: Diagnosis not present

## 2014-07-27 DIAGNOSIS — I5032 Chronic diastolic (congestive) heart failure: Secondary | ICD-10-CM | POA: Insufficient documentation

## 2014-07-27 DIAGNOSIS — Z923 Personal history of irradiation: Secondary | ICD-10-CM | POA: Insufficient documentation

## 2014-07-27 HISTORY — PX: PACEMAKER GENERATOR CHANGE: SHX5481

## 2014-07-27 LAB — SURGICAL PCR SCREEN
MRSA, PCR: NEGATIVE
Staphylococcus aureus: NEGATIVE

## 2014-07-27 LAB — PROTIME-INR
INR: 1.6 — ABNORMAL HIGH (ref 0.00–1.49)
PROTHROMBIN TIME: 19.1 s — AB (ref 11.6–15.2)

## 2014-07-27 SURGERY — PACEMAKER GENERATOR CHANGE
Anesthesia: LOCAL

## 2014-07-27 MED ORDER — MUPIROCIN 2 % EX OINT
1.0000 "application " | TOPICAL_OINTMENT | Freq: Once | CUTANEOUS | Status: DC
  Filled 2014-07-27: qty 22

## 2014-07-27 MED ORDER — SODIUM CHLORIDE 0.9 % IV SOLN: INTRAVENOUS | Status: DC

## 2014-07-27 MED ORDER — FENTANYL CITRATE 0.05 MG/ML IJ SOLN
INTRAMUSCULAR | Status: AC
  Filled 2014-07-27: qty 2

## 2014-07-27 MED ORDER — MUPIROCIN 2 % EX OINT
TOPICAL_OINTMENT | CUTANEOUS | Status: AC
  Administered 2014-07-27: 1
  Filled 2014-07-27: qty 22

## 2014-07-27 MED ORDER — MIDAZOLAM HCL 5 MG/5ML IJ SOLN
INTRAMUSCULAR | Status: AC
  Filled 2014-07-27: qty 5

## 2014-07-27 MED ORDER — HEPARIN (PORCINE) IN NACL 2-0.9 UNIT/ML-% IJ SOLN
INTRAMUSCULAR | Status: AC
  Filled 2014-07-27: qty 1000

## 2014-07-27 MED ORDER — ACETAMINOPHEN 325 MG PO TABS
325.0000 mg | ORAL_TABLET | ORAL | Status: DC | PRN
  Filled 2014-07-27: qty 2

## 2014-07-27 MED ORDER — SODIUM CHLORIDE 0.9 % IJ SOLN: 3.0000 mL | INTRAMUSCULAR | Status: DC | PRN

## 2014-07-27 MED ORDER — ONDANSETRON HCL 4 MG/2ML IJ SOLN: 4.0000 mg | Freq: Four times a day (QID) | INTRAMUSCULAR | Status: DC | PRN

## 2014-07-27 MED ORDER — SODIUM CHLORIDE 0.9 % IV SOLN
INTRAVENOUS | Status: DC
  Administered 2014-07-27: 15:00:00 via INTRAVENOUS

## 2014-07-27 MED ORDER — LIDOCAINE HCL (PF) 1 % IJ SOLN
INTRAMUSCULAR | Status: AC
  Filled 2014-07-27: qty 60

## 2014-07-27 NOTE — Interval H&P Note (Signed)
History and Physical Interval Note:  07/27/2014 4:05 PM  [name]  has presented today for surgery, with the diagnosis of battery depletion  The various methods of treatment have been discussed with the patient and family. After consideration of risks, benefits and other options for treatment, the patient has consented to  Procedure(s): PACEMAKER GENERATOR CHANGE (N/A) as a surgical intervention .  The patient's history has been reviewed, patient examined, no change in status, stable for surgery.  I have reviewed the patient's chart and labs.  Questions were answered to the patient's satisfaction.     [name]

## 2014-07-27 NOTE — Op Note (Signed)
Procedure report  Procedure performed:  1. Dual chamber pacemaker generator changeout  2. Light sedation  Reason for procedure:  1. Device generator at elective replacement interval  2. Sinus node dysfunction with symptomatic bradycardia. 3. Tachy-brady syndrome 4. Pacemaker syndrome Procedure performed by:  Sanda Klein, MD  Complications:  None  Estimated blood loss:  <5 mL  Medications administered during procedure:  Ancef 2 g intravenously, lidocaine 1% 30 mL locally, fentanyl 50 mcg intravenously, Versed 2 mg intravenously Device details:   New Generator Medtronic Adapta model number ADDRL1, serial number [id number] Right atrial lead (chronic) Medtronic, model number 4580, serial [id number] (implanted 02/19/2006) Right ventricular lead (chronic)  Medtronic, model number [id number], serial number [id number] (implanted 02/19/2006)  Explanted generator Medtronic Adapta,  model number ADDR01, serial number  [id number] (implanted 02/19/2006)  Procedure details:  After the risks and benefits of the procedure were discussed the patient provided informed consent. She was brought to the cardiac catheter lab in the fasting state. The patient was prepped and draped in usual sterile fashion. Local anesthesia with 1% lidocaine was administered to to the left infraclavicular area. A 5-6cm horizontal incision was made parallel with and 2-3 cm caudal to the left clavicle, in the area of an old scar. An older scar was seen closer to the left clavicle. Using minimal electrocautery and mostly sharp and blunt dissection the prepectoral pocket was opened carefully to avoid injury to the loops of chronic leads. Extensive dissection was not necessary. The device was explanted. The pocket was carefully inspected for hemostasis and flushed with copious amounts of antibiotic solution.  The leads were disconnected from the old generator and testing of the lead parameters later showed excellent values. The new  generator was connected to the chronic leads, with appropriate pacing noted.   The entire system was then carefully inserted in the pocket with care been taking that the leads and device assumed a comfortable position without pressure on the incision. Great care was taken that the leads be located deep to the generator. The pocket was then closed in layers using 2 layers of 2-0 Vicryl and cutaneous staples after which a sterile dressing was applied.   At the end of the procedure the following lead parameters were encountered:   Right atrial lead sensed P waves 3.9 mV, impedance 404 ohms, threshold 0.5 at 0.5 ms pulse width.  Right ventricular lead sensed R waves  18.7 mV, impedance 599 ohms, threshold 0.9 at 0.5 ms pulse width.   Sanda Klein, MD, [facility] [phone] office [phone] pager

## 2014-07-27 NOTE — H&P (View-Only) (Signed)
Patient ID: SIMRAN MANNIS, male   DOB: Aug 19, 1934, 80 y.o.   MRN: [id number]     Reason for office visit Weakness and dyspnea Pacemaker at [facility]   Mr. Kenner has recently had worsening pedal edema and dyspnea. Despite improvement with diuretics, he continues to have problems with weakness and dyspnea. Interrogation of his pacemaker shows that the device reached elective replacement indicator about 2 weeks ago. His device has switched to VVI mode, which may explain at least part of his symptoms. He has preserved left ventricular systolic function with an ejection fraction of around 50-55% and did not have ischemia by his most recent nuclear stress test. He has a long-standing history of symptomatic persistent atrial fibrillation. For many years this was well controlled with flecainide, but recently this was discontinued due to excessive QRS prolongation, congestive heart failure and depressed systolic function. He is on chronic warfarin anticoagulation and has not had bleeding complications or stroke. In the past has had successful cardioversion as well as pace-termination of atrial flutter via his pacemaker. The device is a dual chamber Medtronic pacemaker  Allergies  Allergen Reactions  . No Known Allergies     Current Outpatient Prescriptions  Medication Sig Dispense Refill  . acetaminophen (TYLENOL) 650 MG CR tablet Take 650 mg by mouth every 8 (eight) hours as needed for pain.       Marland Kitchen allopurinol (ZYLOPRIM) 100 MG tablet Take 1 tablet (100 mg total) by mouth at bedtime.  90 tablet  0  . calcium carbonate (OS-CAL) 600 MG TABS tablet Take 600 mg by mouth daily with breakfast.      . metoprolol succinate (TOPROL-XL) 50 MG 24 hr tablet Take 1 tablet (50 mg total) by mouth daily. <please schedule appointment for future refills>  90 tablet  0  . Misc Natural Products (TART CHERRY ADVANCED PO) Take 1,200 mg by mouth daily.       . Omega-3 Fatty Acids (OMEGA 3 PO) Take 90 mg by mouth daily.       .  pantoprazole (PROTONIX) 40 MG tablet Take 1 tablet (40 mg total) by mouth daily. <please schedule appointment for future refills>  90 tablet  0  . Potassium 99 MG TABS Take 1 tablet by mouth daily.      Marland Kitchen triamcinolone cream (KENALOG) 0.1 % Apply 1 application topically daily as needed (dry skin).       . furosemide (LASIX) 20 MG tablet Take 20 mg by mouth daily.      Marland Kitchen warfarin (COUMADIN) 2.5 MG tablet Take 2.5 mg by mouth daily.       No current facility-administered medications for this visit.    Past Medical History  Diagnosis Date  . Pacemaker     medtronic  . CAD (coronary artery disease)     echo 09-05-2010-EF nl, mod-severe dilated Left atrium; myoview 02-08-12-EF 52%, no ischemia  . HTN (hypertension)   . Barrett's esophagus   . Gout   . Arthritis   . History of cardioversion 02/09/13    electrical synchronized cardioversion, on flecainide and warfarin  . H/O transesophageal echocardiography (TEE) for monitoring 09/05/10    tee guided AT pace termination, did not proceed with cardioversion due to termination of the atrial flutter through his pacemaker  . Atrial fibrillation   . History of stomach ulcers     many yrs ago  . GERD (gastroesophageal reflux disease)   . History of kidney stones     per pt - no  actual diagnosisi  . Cancer     prostate - radiation only  . Pacemaker 09-25-13  . Prostate cancer 09-25-13    tx. with radiation only- dx. 2013  . Diabetes mellitus without complication     "prediabetic"-no oral meds    Past Surgical History  Procedure Laterality Date  . Pacemaker insertion      medtronic adapta- Dr. Sallyanne Kuster follows- LOV 9'14  . Tonsillectomy    . Appendectomy    . Cardioversion N/A 02/09/2013    Procedure: CARDIOVERSION;  Surgeon: Sanda Klein, MD;  Location: [facility];  Service: Cardiovascular;  Laterality: N/A;  . Cardiac catheterization  02/18/06    EF 65%, focal napkin ring-like lesion into the prox LAD otherwise normal coronaries  .  Total knee arthroplasty Right 10/05/2013    Procedure: RIGHT TOTAL KNEE ARTHROPLASTY;  Surgeon: Gearlean Alf, MD;  Location: [facility];  Service: Orthopedics;  Laterality: Right;    Family History  Problem Relation Age of Onset  . Diabetes Mother   . Diabetes Brother     History   Social History  . Marital Status: Married    Spouse Name: N/A    Number of Children: N/A  . Years of Education: N/A   Occupational History  . Not on file.   Social History Main Topics  . Smoking status: Former Smoker    Quit date: 07/22/1970  . Smokeless tobacco: Never Used     Comment: Quit in the 87s  . Alcohol Use: No  . Drug Use: No  . Sexual Activity: Not Currently   Other Topics Concern  . Not on file   Social History Narrative  . No narrative on file    Review of systems: He has weakness dyspnea on exertion, no syncope The patient specifically denies any chest pain at rest or with exertion, dyspnea at rest, orthopnea, paroxysmal nocturnal dyspnea, syncope, palpitations, focal neurological deficits, intermittent claudication, lower extremity edema, unexplained weight gain, cough, hemoptysis or wheezing.  The patient also denies abdominal pain, nausea, vomiting, dysphagia, diarrhea, constipation, polyuria, polydipsia, dysuria, hematuria, frequency, urgency, abnormal bleeding or bruising, fever, chills, unexpected weight changes, mood swings, change in skin or hair texture, change in voice quality, auditory or visual problems, allergic reactions or rashes, new musculoskeletal complaints other than usual "aches and pains".   PHYSICAL EXAM BP 146/90  Pulse 65  Resp 16  Ht 5\' 6"  (1.676 m)  Wt 89.132 kg (196 lb 8 oz)  BMI 31.73 kg/m2  General: Alert, oriented x3, no distress Head: no evidence of trauma, PERRL, EOMI, no exophtalmos or lid lag, no myxedema, no xanthelasma; normal ears, nose and oropharynx Neck: normal jugular venous pulsations and no hepatojugular reflux; brisk carotid  pulses without delay and no carotid bruits Chest: clear to auscultation, no signs of consolidation by percussion or palpation, normal fremitus, symmetrical and full respiratory excursions. Normal pacemaker site Cardiovascular: normal position and quality of the apical impulse, regular rhythm, normal first and paradoxically split second heart sounds, no murmurs, rubs or gallops Abdomen: no tenderness or distention, no masses by palpation, no abnormal pulsatility or arterial bruits, normal bowel sounds, no hepatosplenomegaly Extremities: no clubbing, cyanosis or edema; 2+ radial, ulnar and brachial pulses bilaterally; 2+ right femoral, posterior tibial and dorsalis pedis pulses; 2+ left femoral, posterior tibial and dorsalis pedis pulses; no subclavian or femoral bruits Neurological: grossly nonfocal   EKG: Asynchronous VVI pacing at 65 beats per minutes with retrograde P waves  BMET    Component  Value Date/Time   NA 143 07/21/2014 1612   K 4.5 07/21/2014 1612   CL 110 07/21/2014 1612   CO2 23 07/21/2014 1612   GLUCOSE 101* 07/21/2014 1612   BUN 33* 07/21/2014 1612   CREATININE 1.23 07/21/2014 1612   CREATININE 1.15 10/07/2013 0452   CALCIUM 8.7 07/21/2014 1612   GFRNONAA 60* 10/07/2013 0452   GFRAA 69* 10/07/2013 0452     ASSESSMENT AND PLAN  Mr. Hollomon has mild worsening of chronic diastolic heart failure but to a synchronous ventricular pacing after his pacemaker reached elective replacement indicator. Unfortunately the device cannot be reprogrammed to demand dual-chamber pacing. We'll schedule him for a pacemaker generator change out as soon as possible. Hopefully he will not develop atrial fibrillation between now and then. This procedure has been fully reviewed with the patient and informed consent has been obtained.   Patient Instructions  Your Pacemaker generator battery is depleting and needs to be changed [place] .  This will be done at [place]  Sunday AND Monday - PROCEDURE Tuesday.  Your physician recommends that you return for lab work in: [place].  Return in one week after the procedure for a wound check and INR.        Orders Placed This Encounter  Procedures  . APTT  . CBC  . Comprehensive metabolic panel  . Protime-INR  . EKG 12-Lead  . PACEMAKER GENERATOR CHANGE   Meds ordered this encounter  Medications  . triamcinolone cream (KENALOG) 0.1 %    Sig: Apply 1 application topically daily as needed (dry skin).     Holli Humbles, MD, [place] [phone] office [phone] pager

## 2014-07-27 NOTE — Discharge Instructions (Signed)
Pacemaker Battery Change °A pacemaker battery usually lasts 4 to 12 years. Once or twice per year, you will be asked to visit your health care provider to have a full evaluation of your pacemaker. When a battery needs to be replaced, the entire pacemaker is replaced so that you can benefit from new circuitry and any new features that have been added to pacemakers. Most often, this procedure is very simple because the leads are already in place.  °There are many things that affect how long a pacemaker battery will last, including:  °· The age of the pacemaker.   °· The number of leads (1, 2, or 3).   °· The pacemaker work load. If the pacemaker is helping the heart more often, the battery will not last as long as it would if the pacemaker did not need to help the heart.   °· Power (voltage) settings.  °LET YOUR HEALTH CARE PROVIDER KNOW ABOUT:  °· Any allergies you have.   °· All medicines you are taking, including vitamins, herbs, eye drops, creams, and over-the-counter medicines.   °· Previous problems you or members of your family have had with the use of anesthetics.   °· Any blood disorders you have.   °· Previous surgeries you have had, especially since your last pacemaker placement.   °· Medical conditions you have.   °· Possibility of pregnancy, if this applies. °· Symptoms of chest pain, trouble breathing, palpitations, light-headedness, or feelings of an abnormal or irregular heartbeat. °RISKS AND COMPLICATIONS  °Generally, this is a safe procedure. However, as with any procedure, problems can occur and include:  °· Bleeding.   °· Bruising of the skin around where the incision was made.   °· Pain at the incision site.   °· Pulling apart of the skin at the incision site.   °· Infection.   °· Allergic reaction to anesthetics or other medicines used during the procedure.   °People with diabetes may have a temporary increase in their blood sugar after any surgical procedure.  °BEFORE THE PROCEDURE  °· Wash all  of the skin around the area of the chest where the pacemaker is located.   °· Ask your health care provider for help with any medicine adjustments before the pacemaker is replaced.   °· Do not eat or drink anything after midnight on the night before the procedure or as directed by your health care provider. °· Ask your health care provider if you can take a sip of water with any approved medicines the morning of the procedure. °PROCEDURE  °· After giving medicine to numb the skin (local anesthetic), your health care provider will make a cut to reopen the pocket holding the pacemaker.   °· The old pacemaker will be disconnected from its leads.   °· The leads will be tested.   °· If needed, the leads will be replaced. If the leads are functioning properly, the new pacemaker may be connected to the existing leads. °· A heart monitor and the pacemaker programmer will be used to make sure that the new pacemaker is working properly. °· The incision site will then be closed. A dressing will be placed over the pacemaker site. The dressing will be removed 24-48 hours afterward. °AFTER THE PROCEDURE  °· You will be taken to a recovery area after the new pacemaker implant is completed. Your vital signs such as blood pressure, heart rate, breathing, and oxygen levels will be monitored. °· Your health care provider will tell you when you will need to next test your pacemaker or when to return to the office for follow-up   for removal of stitches. °Document Released: 01/30/2007 Document Revised: 03/08/2014 Document Reviewed: 05/06/2013 °[employer]® Patient Information ©2015 [employer]. This information is not intended to replace advice given to you by your health care provider. Make sure you discuss any questions you have with your health care provider. ° °

## 2014-07-29 ENCOUNTER — Other Ambulatory Visit: Payer: Self-pay | Admitting: Cardiovascular Disease

## 2014-07-30 DIAGNOSIS — Z6831 Body mass index (BMI) 31.0-31.9, adult: Secondary | ICD-10-CM | POA: Diagnosis not present

## 2014-07-30 DIAGNOSIS — J309 Allergic rhinitis, unspecified: Secondary | ICD-10-CM | POA: Diagnosis not present

## 2014-08-03 ENCOUNTER — Encounter: Payer: Self-pay | Admitting: Cardiovascular Disease

## 2014-08-04 ENCOUNTER — Ambulatory Visit (INDEPENDENT_AMBULATORY_CARE_PROVIDER_SITE_OTHER): Payer: Medicare Other | Admitting: Pharmacist Clinician (PhC)/ Clinical Pharmacy Specialist

## 2014-08-04 ENCOUNTER — Ambulatory Visit (INDEPENDENT_AMBULATORY_CARE_PROVIDER_SITE_OTHER): Payer: Medicare Other | Admitting: Physician Assistant

## 2014-08-04 ENCOUNTER — Encounter: Payer: Self-pay | Admitting: Physician Assistant

## 2014-08-04 VITALS — BP 140/70 | HR 80 | Ht 66.0 in | Wt 186.4 lb

## 2014-08-04 DIAGNOSIS — Z7901 Long term (current) use of anticoagulants: Secondary | ICD-10-CM | POA: Diagnosis not present

## 2014-08-04 DIAGNOSIS — I4891 Unspecified atrial fibrillation: Secondary | ICD-10-CM | POA: Diagnosis not present

## 2014-08-04 DIAGNOSIS — I4819 Other persistent atrial fibrillation: Secondary | ICD-10-CM

## 2014-08-04 DIAGNOSIS — I10 Essential (primary) hypertension: Secondary | ICD-10-CM

## 2014-08-04 DIAGNOSIS — Z950 Presence of cardiac pacemaker: Secondary | ICD-10-CM

## 2014-08-04 LAB — POCT INR: INR: 1.7

## 2014-08-04 NOTE — Assessment & Plan Note (Signed)
BP stable no changes

## 2014-08-04 NOTE — Assessment & Plan Note (Signed)
SP generator change out. Wound looks good.  No signs of infection.  Followup with Dr. Sallyanne Kuster for device check in one month.

## 2014-08-04 NOTE — Progress Notes (Signed)
Date:  08/04/2014   ID:  [name], DOB 07-Jun-1934, MRN [id number]  PCP:  Purvis Kilts, MD  Primary Cardiologist:  [facility]    History of Present Illness: [name] is a 80 y.o. male with a history of coronary artery disease, atrial fibrillation hypertension, GERD, diabetes, pacemaker.   Interrogation of his pacemaker shows that the device reached elective replacement and on 07/27/2014 the patient underwent generator change out.  He has preserved left ventricular systolic function with an ejection fraction of around 50-55% and did not have ischemia by his most recent nuclear stress test. He has a long-standing history of symptomatic persistent atrial fibrillation. For many years this was well controlled with flecainide, but recently this was discontinued due to excessive QRS prolongation, congestive heart failure and depressed systolic function. He is on chronic warfarin anticoagulation and has not had bleeding complications or stroke. In the past has had successful cardioversion as well as pace-termination of atrial flutter via his pacemaker. The device is a dual chamber Medtronic pacemaker  Patient presents today for a wound site check and staple removal.. He reports doing well and is starting to feel better. He has some mild lower extremity edema.  The patient currently denies nausea, vomiting, fever, chest pain, shortness of breath, orthopnea, dizziness, PND, cough, congestion, abdominal pain, hematochezia, melena, claudication.  Wt Readings from Last 3 Encounters:  08/04/14 186 lb 6.4 oz (84.55 kg)  07/27/14 188 lb (85.276 kg)  07/27/14 188 lb (85.276 kg)     Past Medical History  Diagnosis Date  . Pacemaker     medtronic  . CAD (coronary artery disease)     echo 09-05-2010-EF nl, mod-severe dilated Left atrium; myoview 02-08-12-EF 52%, no ischemia  . HTN (hypertension)   . Barrett's esophagus   . Gout   . Arthritis   . History of cardioversion 02/09/13   electrical synchronized cardioversion, on flecainide and warfarin  . H/O transesophageal echocardiography (TEE) for monitoring 09/05/10    tee guided AT pace termination, did not proceed with cardioversion due to termination of the atrial flutter through his pacemaker  . Atrial fibrillation   . History of stomach ulcers     many yrs ago  . GERD (gastroesophageal reflux disease)   . History of kidney stones     per pt - no actual diagnosisi  . Cancer     prostate - radiation only  . Pacemaker 09-25-13  . Prostate cancer 09-25-13    tx. with radiation only- dx. 2013  . Diabetes mellitus without complication     "prediabetic"-no oral meds    Current Outpatient Prescriptions  Medication Sig Dispense Refill  . acetaminophen (TYLENOL) 650 MG CR tablet Take 650 mg by mouth every 8 (eight) hours as needed for pain.       Marland Kitchen allopurinol (ZYLOPRIM) 100 MG tablet TAKE 1 TABLET AT BEDTIME  90 tablet  0  . calcium carbonate (OS-CAL) 600 MG TABS tablet Take 600 mg by mouth daily with breakfast.      . furosemide (LASIX) 20 MG tablet Take 20 mg by mouth daily.      . metoprolol succinate (TOPROL-XL) 50 MG 24 hr tablet Take 1 tablet (50 mg total) by mouth daily. <please schedule appointment for future refills>  90 tablet  0  . Misc Natural Products (TART CHERRY ADVANCED PO) Take 1,200 mg by mouth daily.       . Omega-3 Fatty Acids (OMEGA 3 PO) Take 90 mg  by mouth daily.       . pantoprazole (PROTONIX) 40 MG tablet Take 1 tablet (40 mg total) by mouth daily. <please schedule appointment for future refills>  90 tablet  0  . Potassium 99 MG TABS Take 1 tablet by mouth daily.      Marland Kitchen triamcinolone cream (KENALOG) 0.1 % Apply 1 application topically daily as needed (dry skin).       Marland Kitchen warfarin (COUMADIN) 2.5 MG tablet Take 2.5 mg by mouth daily.       No current facility-administered medications for this visit.    Allergies:    Allergies  Allergen Reactions  . No Known Allergies     Social History:   The patient  reports that he quit smoking about 44 years ago. He has never used smokeless tobacco. He reports that he does not drink alcohol or use illicit drugs.   Family history:   Family History  Problem Relation Age of Onset  . Diabetes Mother   . Diabetes Brother     ROS:  Please see the history of present illness.  All other systems reviewed and negative.   PHYSICAL EXAM: VS:  BP 140/70  Pulse 80  Ht 5\' 6"  (1.676 m)  Wt 186 lb 6.4 oz (84.55 kg)  BMI 30.10 kg/m2 Well nourished, well developed, in no acute distress HEENT: Pupils are equal round react to light accommodation extraocular movements are intact.  Cardiac: Regular rate and rhythm without murmurs rubs or gallops. Lungs:  clear to auscultation bilaterally, no wheezing, rhonchi or rales Abd: soft, nontender, positive bowel sounds all quadrants, no hepatosplenomegaly Ext  Trace lower extremity edema.  2+ radial and dorsalis pedis pulses. Skin: warm and dry.  No erythema, edema, ecchymosis at the pacer site. No drainage noted. There is mild irritation around the peripheral where the tape was attached to the skin. Neuro:  Grossly normal  EKG: None    ASSESSMENT AND PLAN:  Problem List Items Addressed This Visit   HTN (hypertension) - Primary (Chronic)     BP stable no changes    Pacemaker (Chronic)     SP generator change out. Wound looks good.  No signs of infection.  Followup with Dr. Sallyanne Kuster for device check in one month.

## 2014-08-04 NOTE — Patient Instructions (Signed)
Your physician recommends that you schedule a follow-up appointment in: [place]

## 2014-08-05 ENCOUNTER — Telehealth: Payer: Self-pay | Admitting: Cardiovascular Disease

## 2014-08-05 NOTE — Telephone Encounter (Signed)
Closed encounter °

## 2014-08-18 ENCOUNTER — Ambulatory Visit (INDEPENDENT_AMBULATORY_CARE_PROVIDER_SITE_OTHER): Payer: Medicare Other | Admitting: Pharmacist Clinician (PhC)/ Clinical Pharmacy Specialist

## 2014-08-18 DIAGNOSIS — I4891 Unspecified atrial fibrillation: Secondary | ICD-10-CM

## 2014-08-18 DIAGNOSIS — Z7901 Long term (current) use of anticoagulants: Secondary | ICD-10-CM | POA: Diagnosis not present

## 2014-08-18 LAB — POCT INR: INR: 2.3

## 2014-09-06 ENCOUNTER — Encounter: Payer: Self-pay | Admitting: Cardiovascular Disease

## 2014-09-06 ENCOUNTER — Ambulatory Visit (INDEPENDENT_AMBULATORY_CARE_PROVIDER_SITE_OTHER): Payer: Medicare Other | Admitting: *Deleted

## 2014-09-06 ENCOUNTER — Ambulatory Visit (INDEPENDENT_AMBULATORY_CARE_PROVIDER_SITE_OTHER): Payer: Medicare Other | Admitting: Pharmacist

## 2014-09-06 DIAGNOSIS — I4891 Unspecified atrial fibrillation: Secondary | ICD-10-CM

## 2014-09-06 DIAGNOSIS — Z7901 Long term (current) use of anticoagulants: Secondary | ICD-10-CM | POA: Diagnosis not present

## 2014-09-06 DIAGNOSIS — I480 Paroxysmal atrial fibrillation: Secondary | ICD-10-CM

## 2014-09-06 DIAGNOSIS — I495 Sick sinus syndrome: Secondary | ICD-10-CM | POA: Diagnosis not present

## 2014-09-06 LAB — MDC_IDC_ENUM_SESS_TYPE_INCLINIC
Battery Impedance: 100 Ohm
Battery Remaining Longevity: 160 mo
Battery Voltage: 2.79 V
Brady Statistic AP VP Percent: 0 %
Brady Statistic AS VP Percent: 0 %
Brady Statistic AS VS Percent: 5 %
Date Time Interrogation Session: 20151102135913
Lead Channel Impedance Value: 474 Ohm
Lead Channel Impedance Value: 610 Ohm
Lead Channel Pacing Threshold Amplitude: 0.5 V
Lead Channel Pacing Threshold Pulse Width: 0.4 ms
Lead Channel Pacing Threshold Pulse Width: 0.4 ms
Lead Channel Sensing Intrinsic Amplitude: 22.4 mV
Lead Channel Sensing Intrinsic Amplitude: 4 mV
Lead Channel Setting Pacing Pulse Width: 0.4 ms
MDC IDC MSMT LEADCHNL RV PACING THRESHOLD AMPLITUDE: 0.75 V
MDC IDC SET LEADCHNL RA PACING AMPLITUDE: 1.5 V
MDC IDC SET LEADCHNL RV PACING AMPLITUDE: 2 V
MDC IDC SET LEADCHNL RV SENSING SENSITIVITY: 5.6 mV
MDC IDC STAT BRADY AP VS PERCENT: 95 %

## 2014-09-06 LAB — POCT INR: INR: 1.8

## 2014-09-06 NOTE — Progress Notes (Signed)
Pacemaker check in clinic. Normal device function. Thresholds, sensing, impedances consistent with previous measurements. Device programmed to maximize longevity. 30 mode switches (1.1%)---8 AHR episodes---max dur. 3 hrs 39 mins, Max A 282, Max V 111 + Warfarin. No high ventricular rates noted. Device programmed at appropriate safety margins. Histogram distribution appropriate for patient activity level. Device programmed to optimize intrinsic conduction. Estimated longevity 13 years. Patient will follow up with [facility] on 1/26 @ 11:00am.

## 2014-09-28 ENCOUNTER — Ambulatory Visit (INDEPENDENT_AMBULATORY_CARE_PROVIDER_SITE_OTHER): Payer: Medicare Other | Admitting: Pharmacist Clinician (PhC)/ Clinical Pharmacy Specialist

## 2014-09-28 DIAGNOSIS — I4891 Unspecified atrial fibrillation: Secondary | ICD-10-CM

## 2014-09-28 DIAGNOSIS — Z7901 Long term (current) use of anticoagulants: Secondary | ICD-10-CM | POA: Diagnosis not present

## 2014-09-28 LAB — POCT INR: INR: 3.3

## 2014-09-30 ENCOUNTER — Other Ambulatory Visit: Payer: Self-pay | Admitting: Cardiovascular Disease

## 2014-10-07 DIAGNOSIS — Z96651 Presence of right artificial knee joint: Secondary | ICD-10-CM | POA: Diagnosis not present

## 2014-10-07 DIAGNOSIS — Z471 Aftercare following joint replacement surgery: Secondary | ICD-10-CM | POA: Diagnosis not present

## 2014-10-11 ENCOUNTER — Ambulatory Visit (INDEPENDENT_AMBULATORY_CARE_PROVIDER_SITE_OTHER): Payer: Medicare Other | Admitting: Pharmacist Clinician (PhC)/ Clinical Pharmacy Specialist

## 2014-10-11 DIAGNOSIS — I4891 Unspecified atrial fibrillation: Secondary | ICD-10-CM | POA: Diagnosis not present

## 2014-10-11 DIAGNOSIS — Z7901 Long term (current) use of anticoagulants: Secondary | ICD-10-CM

## 2014-10-11 LAB — POCT INR: INR: 2.7

## 2014-10-11 MED ORDER — WARFARIN SODIUM 2.5 MG PO TABS: ORAL_TABLET | ORAL | Status: DC

## 2014-10-14 ENCOUNTER — Encounter (HOSPITAL_COMMUNITY): Payer: Self-pay | Admitting: Cardiovascular Disease

## 2014-11-02 ENCOUNTER — Telehealth: Payer: Self-pay | Admitting: Cardiovascular Disease

## 2014-11-02 MED ORDER — METOPROLOL SUCCINATE ER 50 MG PO TB24: 50.0000 mg | ORAL_TABLET | Freq: Every day | ORAL | Status: DC

## 2014-11-02 NOTE — Telephone Encounter (Signed)
Nikki from [facility] is calling to get a 30 day supply of his Metoprolol until his mail order gets him his . Please call if you have any questions . Patient is at the Pharmacy Waiting

## 2014-11-02 NOTE — Telephone Encounter (Signed)
Spoke w/ pharmacy tech who verified receipt of e-prescription.

## 2014-11-08 ENCOUNTER — Ambulatory Visit (INDEPENDENT_AMBULATORY_CARE_PROVIDER_SITE_OTHER): Payer: Medicare Other | Admitting: Pharmacist Clinician (PhC)/ Clinical Pharmacy Specialist

## 2014-11-08 DIAGNOSIS — Z7901 Long term (current) use of anticoagulants: Secondary | ICD-10-CM | POA: Diagnosis not present

## 2014-11-08 DIAGNOSIS — I4891 Unspecified atrial fibrillation: Secondary | ICD-10-CM | POA: Diagnosis not present

## 2014-11-08 LAB — POCT INR: INR: 3.3

## 2014-11-09 DIAGNOSIS — E6609 Other obesity due to excess calories: Secondary | ICD-10-CM | POA: Diagnosis not present

## 2014-11-09 DIAGNOSIS — Z6830 Body mass index (BMI) 30.0-30.9, adult: Secondary | ICD-10-CM | POA: Diagnosis not present

## 2014-11-09 DIAGNOSIS — J019 Acute sinusitis, unspecified: Secondary | ICD-10-CM | POA: Diagnosis not present

## 2014-11-11 ENCOUNTER — Other Ambulatory Visit: Payer: Self-pay | Admitting: Cardiovascular Disease

## 2014-11-22 ENCOUNTER — Ambulatory Visit (INDEPENDENT_AMBULATORY_CARE_PROVIDER_SITE_OTHER): Payer: Medicare Other | Admitting: Pharmacist Clinician (PhC)/ Clinical Pharmacy Specialist

## 2014-11-22 DIAGNOSIS — I4891 Unspecified atrial fibrillation: Secondary | ICD-10-CM

## 2014-11-22 DIAGNOSIS — Z7901 Long term (current) use of anticoagulants: Secondary | ICD-10-CM | POA: Diagnosis not present

## 2014-11-22 LAB — POCT INR: INR: 2.8

## 2014-11-30 ENCOUNTER — Encounter: Payer: Self-pay | Admitting: Cardiovascular Disease

## 2014-11-30 ENCOUNTER — Ambulatory Visit (INDEPENDENT_AMBULATORY_CARE_PROVIDER_SITE_OTHER): Payer: Medicare Other | Admitting: Cardiovascular Disease

## 2014-11-30 VITALS — BP 154/92 | HR 83 | Ht 67.0 in | Wt 191.5 lb

## 2014-11-30 DIAGNOSIS — I480 Paroxysmal atrial fibrillation: Secondary | ICD-10-CM | POA: Diagnosis not present

## 2014-11-30 DIAGNOSIS — I97190 Other postprocedural cardiac functional disturbances following cardiac surgery: Secondary | ICD-10-CM

## 2014-11-30 DIAGNOSIS — Z950 Presence of cardiac pacemaker: Secondary | ICD-10-CM

## 2014-11-30 DIAGNOSIS — I10 Essential (primary) hypertension: Secondary | ICD-10-CM

## 2014-11-30 DIAGNOSIS — I495 Sick sinus syndrome: Secondary | ICD-10-CM

## 2014-11-30 LAB — MDC_IDC_ENUM_SESS_TYPE_INCLINIC
Battery Impedance: 100 Ohm
Battery Remaining Longevity: 13.5
Brady Statistic AP VP Percent: 0.1 % — CL
Brady Statistic AS VP Percent: 0.1 % — CL
Lead Channel Impedance Value: 502 Ohm
Lead Channel Impedance Value: 646 Ohm
Lead Channel Pacing Threshold Amplitude: 0.5 V
Lead Channel Pacing Threshold Amplitude: 0.875 V
Lead Channel Pacing Threshold Pulse Width: 0.4 ms
Lead Channel Sensing Intrinsic Amplitude: 16 mV
Lead Channel Setting Pacing Amplitude: 1.5 V
Lead Channel Setting Pacing Amplitude: 2 V
Lead Channel Setting Sensing Sensitivity: 5.6 mV
MDC IDC MSMT BATTERY VOLTAGE: 2.79 V
MDC IDC MSMT LEADCHNL RV PACING THRESHOLD PULSEWIDTH: 0.4 ms
MDC IDC SET LEADCHNL RV PACING PULSEWIDTH: 0.4 ms
MDC IDC STAT BRADY AP VS PERCENT: 97.5 %
MDC IDC STAT BRADY AS VS PERCENT: 2.3 %

## 2014-11-30 MED ORDER — PANTOPRAZOLE SODIUM 40 MG PO TBEC: 40.0000 mg | DELAYED_RELEASE_TABLET | Freq: Every day | ORAL | Status: DC

## 2014-11-30 MED ORDER — ALLOPURINOL 100 MG PO TABS: 100.0000 mg | ORAL_TABLET | Freq: Every day | ORAL | Status: DC

## 2014-11-30 MED ORDER — METOPROLOL SUCCINATE ER 50 MG PO TB24: 50.0000 mg | ORAL_TABLET | Freq: Every day | ORAL | Status: DC

## 2014-11-30 NOTE — Patient Instructions (Signed)
Dr Sallyanne Kuster recommends that you schedule a follow-up appointment in 6 months with a device check. You will receive a reminder letter in the mail two months in advance. If you don't receive a letter, please call our office to schedule the follow-up appointment.

## 2014-11-30 NOTE — Progress Notes (Signed)
Patient ID: [name], male   DOB: 03-02-1934, 81 y.o.   MRN: [id number]      Reason for office visit Pacemaker check, PAFib  [name] feels much better than last year, with no CV complaints. Retrospectively, one of his problems was probably flecainide toxicity and secondary heart failure (more important than pacemaker syndrome when he switched to VVI pacing due to pacemaker at [facility]).  Pacemaker check shows 97.5% A pcing, no V pacing, > 13 years generator expected longevity and only one episode of PAF since September (asymptomatic 4 minute event on January 2, average V rate 73 bpm). No embolic or bleeding problems (warfarin anticoagulation).  He has a history of coronary artery disease, atrial fibrillation, hypertension, GERD, diabetes, pacemaker for SSS. On 07/27/2014 the patient underwent generator change out.EF has usually been 50-55%, but dropped to 40-45% when he had broad QRS/flecainide toxicity. He did not have ischemia by his most recent nuclear stress test. He has a long-standing history of symptomatic persistent atrial fibrillation. For many years this was well controlled with flecainide, but recently this was discontinued due to excessive QRS prolongation, congestive heart failure and depressed systolic function. He is on chronic warfarin anticoagulation and has not had bleeding complications or stroke. In the past has had successful cardioversion as well as pace-termination of atrial flutter via his pacemaker. The device is a dual chamber Medtronic pacemaker  Allergies  Allergen Reactions  . No Known Allergies     Current Outpatient Prescriptions  Medication Sig Dispense Refill  . acetaminophen (TYLENOL) 650 MG CR tablet Take 650 mg by mouth every 8 (eight) hours as needed for pain.     Marland Kitchen allopurinol (ZYLOPRIM) 100 MG tablet Take 1 tablet (100 mg total) by mouth at bedtime. 90 tablet 3  . calcium carbonate (OS-CAL) 600 MG TABS tablet Take 600 mg by mouth daily with breakfast.     . furosemide (LASIX) 20 MG tablet Take 20 mg by mouth as needed.     . metoprolol succinate (TOPROL-XL) 50 MG 24 hr tablet Take 1 tablet (50 mg total) by mouth daily. 90 tablet 3  . Misc Natural Products (TART CHERRY ADVANCED PO) Take 1,200 mg by mouth daily.     . Omega-3 Fatty Acids (OMEGA 3 PO) Take 90 mg by mouth daily.     . pantoprazole (PROTONIX) 40 MG tablet Take 1 tablet (40 mg total) by mouth daily. 90 tablet 3  . Potassium 99 MG TABS Take 1 tablet by mouth as needed.     . warfarin (COUMADIN) 2.5 MG tablet Take 1 tablet by mouth daily or as directed by coumadin clinic 30 tablet 5   No current facility-administered medications for this visit.    Past Medical History  Diagnosis Date  . Pacemaker     medtronic  . CAD (coronary artery disease)     echo 09-05-2010-EF nl, mod-severe dilated Left atrium; myoview 02-08-12-EF 52%, no ischemia  . HTN (hypertension)   . Barrett's esophagus   . Gout   . Arthritis   . History of cardioversion 02/09/13    electrical synchronized cardioversion, on flecainide and warfarin  . H/O transesophageal echocardiography (TEE) for monitoring 09/05/10    tee guided AT pace termination, did not proceed with cardioversion due to termination of the atrial flutter through his pacemaker  . Atrial fibrillation   . History of stomach ulcers     many yrs ago  . GERD (gastroesophageal reflux disease)   . History of kidney stones  per pt - no actual diagnosisi  . Cancer     prostate - radiation only  . Pacemaker 09-25-13  . Prostate cancer 09-25-13    tx. with radiation only- dx. 2013  . Diabetes mellitus without complication     "prediabetic"-no oral meds    Past Surgical History  Procedure Laterality Date  . Pacemaker insertion      medtronic adapta- Dr. Sallyanne Kuster follows- LOV 9'14  . Tonsillectomy    . Appendectomy    . Cardioversion N/A 02/09/2013    Procedure: CARDIOVERSION;  Surgeon: Sanda Klein, MD;  Location: [facility] ENDOSCOPY;  Service:  Cardiovascular;  Laterality: N/A;  . Cardiac catheterization  02/18/06    EF 65%, focal napkin ring-like lesion into the prox LAD otherwise normal coronaries  . Total knee arthroplasty Right 10/05/2013    Procedure: RIGHT TOTAL KNEE ARTHROPLASTY;  Surgeon: Gearlean Alf, MD;  Location: [facility] ORS;  Service: Orthopedics;  Laterality: Right;  . Pacemaker generator change N/A 07/27/2014    Procedure: PACEMAKER GENERATOR CHANGE;  Surgeon: Sanda Klein, MD;  Location: [facility] CATH LAB;  Service: Cardiovascular;  Laterality: N/A;    Family History  Problem Relation Age of Onset  . Diabetes Mother   . Diabetes Brother     History   Social History  . Marital Status: Married    Spouse Name: N/A    Number of Children: N/A  . Years of Education: N/A   Occupational History  . Not on file.   Social History Main Topics  . Smoking status: Former Smoker    Quit date: 07/22/1970  . Smokeless tobacco: Never Used     Comment: Quit in the 26s  . Alcohol Use: No  . Drug Use: No  . Sexual Activity: Not Currently   Other Topics Concern  . Not on file   Social History Narrative    Review of systems: The patient specifically denies any chest pain at rest or with exertion, dyspnea at rest or with exertion, orthopnea, paroxysmal nocturnal dyspnea, syncope, palpitations, focal neurological deficits, intermittent claudication, lower extremity edema, unexplained weight gain, cough, hemoptysis or wheezing.  The patient also denies abdominal pain, nausea, vomiting, dysphagia, diarrhea, constipation, polyuria, polydipsia, dysuria, hematuria, frequency, urgency, abnormal bleeding or bruising, fever, chills, unexpected weight changes, mood swings, change in skin or hair texture, change in voice quality, auditory or visual problems, allergic reactions or rashes, new musculoskeletal complaints other than usual "aches and pains".   PHYSICAL EXAM BP 154/92 mmHg  Pulse 83  Ht 5\' 7"  (1.702 m)  Wt 86.864 kg (191 lb  8 oz)  BMI 29.99 kg/m2  General: Alert, oriented x3, no distress Head: no evidence of trauma, PERRL, EOMI, no exophtalmos or lid lag, no myxedema, no xanthelasma; normal ears, nose and oropharynx Neck: normal jugular venous pulsations and no hepatojugular reflux; brisk carotid pulses without delay and no carotid bruits Chest: clear to auscultation, no signs of consolidation by percussion or palpation, normal fremitus, symmetrical and full respiratory excursions Cardiovascular: normal position and quality of the apical impulse, regular rhythm, normal first and second heart sounds, no murmurs, rubs or gallops Abdomen: no tenderness or distention, no masses by palpation, no abnormal pulsatility or arterial bruits, normal bowel sounds, no hepatosplenomegaly Extremities: no clubbing, cyanosis or edema; 2+ radial, ulnar and brachial pulses bilaterally; 2+ right femoral, posterior tibial and dorsalis pedis pulses; 2+ left femoral, posterior tibial and dorsalis pedis pulses; no subclavian or femoral bruits Neurological: grossly nonfocal   EKG: A paced  V sensed. QRS 92 ms, QTC 455 ms  Lipid Panel  No results found for: CHOL, TRIG, HDL, CHOLHDL, VLDL, LDLCALC, LDLDIRECT  BMET    Component Value Date/Time   NA 143 07/21/2014 1612   K 4.5 07/21/2014 1612   CL 110 07/21/2014 1612   CO2 23 07/21/2014 1612   GLUCOSE 101* 07/21/2014 1612   BUN 33* 07/21/2014 1612   CREATININE 1.23 07/21/2014 1612   CREATININE 1.15 10/07/2013 0452   CALCIUM 8.7 07/21/2014 1612   GFRNONAA 60* 10/07/2013 0452   GFRAA 69* 10/07/2013 0452     ASSESSMENT AND PLAN Acute on chronic combined systolic and diastolic CHF (congestive heart failure) This was related to flecainide therapy.He does not have any clinical signs or symptoms of congestive heart failure.  Atrial fibrillation Minimal recurrence on monotherapy with beta blockers. Probably, any antiarrhythmic we choose lead to increased frequency of ventricular  pacing. If the atrial arrhythmia recurs consider referral to electrophysiology for tailored antiarrhythmic therapy versus radiofrequency ablation. Consider upgrade to a biventricular device if he again has heart failure.  Pacemaker Medtronic Adapta dual-chamber device (leads implanted 02/19/2006, generator change September 2015). Plan [facility] downloads every 3 months. Return to office for a clinic evaluation in 6-9 months. We'll try to synchronize his visit with his wife's appointment since they have to drive a long way from [place].  HTN (hypertension) Rechecked 10 minutes later his blood pressure was better at 150/84 mm Hg. At home, he reports his monitor typically reads under 140/70.  No changes are made to his chronic medications, but I asked him to check his BP periodically and let me know if SBP is repeatedly > 150..  Orders Placed This Encounter  Procedures  . Implantable device check   Meds ordered this encounter  Medications  . DISCONTD: allopurinol (ZYLOPRIM) 100 MG tablet    Sig: Take 1 tablet (100 mg total) by mouth at bedtime.    Dispense:  30 tablet    Refill:  0  . DISCONTD: metoprolol succinate (TOPROL-XL) 50 MG 24 hr tablet    Sig: Take 1 tablet (50 mg total) by mouth daily.    Dispense:  30 tablet    Refill:  0  . DISCONTD: pantoprazole (PROTONIX) 40 MG tablet    Sig: Take 1 tablet (40 mg total) by mouth daily.    Dispense:  90 tablet    Refill:  0  . pantoprazole (PROTONIX) 40 MG tablet    Sig: Take 1 tablet (40 mg total) by mouth daily.    Dispense:  90 tablet    Refill:  3  . allopurinol (ZYLOPRIM) 100 MG tablet    Sig: Take 1 tablet (100 mg total) by mouth at bedtime.    Dispense:  90 tablet    Refill:  3  . metoprolol succinate (TOPROL-XL) 50 MG 24 hr tablet    Sig: Take 1 tablet (50 mg total) by mouth daily.    Dispense:  90 tablet    Refill:  Edwards Venkat Ankney, MD, [facility] [phone] office [phone]  pager

## 2014-12-03 NOTE — Addendum Note (Signed)
Addended by: Chauncy Lean. on: 12/03/2014 08:44 AM   Modules accepted: Orders

## 2014-12-07 ENCOUNTER — Encounter: Payer: Self-pay | Admitting: Cardiovascular Disease

## 2014-12-20 ENCOUNTER — Ambulatory Visit: Payer: Medicare Other | Admitting: Pharmacist Clinician (PhC)/ Clinical Pharmacy Specialist

## 2014-12-23 ENCOUNTER — Other Ambulatory Visit: Payer: Self-pay

## 2014-12-23 MED ORDER — METOPROLOL SUCCINATE ER 50 MG PO TB24: 50.0000 mg | ORAL_TABLET | Freq: Every day | ORAL | Status: DC

## 2014-12-23 NOTE — Telephone Encounter (Signed)
Rx(s) sent to pharmacy electronically.  

## 2014-12-25 ENCOUNTER — Other Ambulatory Visit: Payer: Self-pay | Admitting: Cardiovascular Disease

## 2014-12-27 ENCOUNTER — Ambulatory Visit (INDEPENDENT_AMBULATORY_CARE_PROVIDER_SITE_OTHER): Payer: Medicare Other | Admitting: Pharmacist Clinician (PhC)/ Clinical Pharmacy Specialist

## 2014-12-27 DIAGNOSIS — I4891 Unspecified atrial fibrillation: Secondary | ICD-10-CM

## 2014-12-27 DIAGNOSIS — Z7901 Long term (current) use of anticoagulants: Secondary | ICD-10-CM

## 2014-12-27 LAB — POCT INR: INR: 2.3

## 2015-01-20 DIAGNOSIS — J320 Chronic maxillary sinusitis: Secondary | ICD-10-CM | POA: Diagnosis not present

## 2015-01-20 DIAGNOSIS — J209 Acute bronchitis, unspecified: Secondary | ICD-10-CM | POA: Diagnosis not present

## 2015-01-20 DIAGNOSIS — Z6830 Body mass index (BMI) 30.0-30.9, adult: Secondary | ICD-10-CM | POA: Diagnosis not present

## 2015-02-01 ENCOUNTER — Ambulatory Visit: Payer: Medicare Other | Admitting: *Deleted

## 2015-02-01 ENCOUNTER — Ambulatory Visit (INDEPENDENT_AMBULATORY_CARE_PROVIDER_SITE_OTHER): Payer: Medicare Other | Admitting: *Deleted

## 2015-02-01 DIAGNOSIS — I480 Paroxysmal atrial fibrillation: Secondary | ICD-10-CM | POA: Diagnosis not present

## 2015-02-28 ENCOUNTER — Ambulatory Visit (INDEPENDENT_AMBULATORY_CARE_PROVIDER_SITE_OTHER): Payer: Medicare Other | Admitting: Pharmacist Clinician (PhC)/ Clinical Pharmacy Specialist

## 2015-02-28 DIAGNOSIS — I480 Paroxysmal atrial fibrillation: Secondary | ICD-10-CM | POA: Diagnosis not present

## 2015-02-28 LAB — POCT INR
INR: 2.1
INR: 2.3

## 2015-03-01 ENCOUNTER — Ambulatory Visit: Payer: Medicare Other | Admitting: *Deleted

## 2015-03-01 ENCOUNTER — Ambulatory Visit (INDEPENDENT_AMBULATORY_CARE_PROVIDER_SITE_OTHER): Payer: Medicare Other | Admitting: *Deleted

## 2015-03-01 ENCOUNTER — Telehealth: Payer: Self-pay | Admitting: Cardiology

## 2015-03-01 DIAGNOSIS — I495 Sick sinus syndrome: Secondary | ICD-10-CM

## 2015-03-01 NOTE — Telephone Encounter (Signed)
LMOVM reminding pt to send remote transmission.   

## 2015-03-05 DIAGNOSIS — L0390 Cellulitis, unspecified: Secondary | ICD-10-CM | POA: Diagnosis not present

## 2015-03-05 DIAGNOSIS — S80862A Insect bite (nonvenomous), left lower leg, initial encounter: Secondary | ICD-10-CM | POA: Diagnosis not present

## 2015-03-05 DIAGNOSIS — L309 Dermatitis, unspecified: Secondary | ICD-10-CM | POA: Diagnosis not present

## 2015-03-05 DIAGNOSIS — W57XXXA Bitten or stung by nonvenomous insect and other nonvenomous arthropods, initial encounter: Secondary | ICD-10-CM | POA: Diagnosis not present

## 2015-03-05 DIAGNOSIS — S80861A Insect bite (nonvenomous), right lower leg, initial encounter: Secondary | ICD-10-CM | POA: Diagnosis not present

## 2015-03-09 NOTE — Progress Notes (Signed)
Remote pacemaker transmission.   

## 2015-03-13 LAB — CUP PACEART REMOTE DEVICE CHECK
Battery Impedance: 100 Ohm
Battery Remaining Longevity: 158 mo
Battery Voltage: 2.79 V
Brady Statistic AP VS Percent: 98 %
Brady Statistic AS VP Percent: 0 %
Brady Statistic AS VS Percent: 2 %
Date Time Interrogation Session: 20160426225747
Lead Channel Impedance Value: 695 Ohm
Lead Channel Pacing Threshold Amplitude: 0.5 V
Lead Channel Pacing Threshold Pulse Width: 0.4 ms
Lead Channel Pacing Threshold Pulse Width: 0.4 ms
Lead Channel Sensing Intrinsic Amplitude: 16 mV
Lead Channel Setting Pacing Amplitude: 2 V
Lead Channel Setting Pacing Pulse Width: 0.4 ms
Lead Channel Setting Sensing Sensitivity: 5.6 mV
MDC IDC MSMT LEADCHNL RA IMPEDANCE VALUE: 460 Ohm
MDC IDC MSMT LEADCHNL RV PACING THRESHOLD AMPLITUDE: 0.875 V
MDC IDC SET LEADCHNL RA PACING AMPLITUDE: 1.5 V
MDC IDC STAT BRADY AP VP PERCENT: 0 %

## 2015-03-17 ENCOUNTER — Encounter: Payer: Self-pay | Admitting: Cardiology

## 2015-03-23 ENCOUNTER — Encounter: Payer: Self-pay | Admitting: Cardiovascular Disease

## 2015-03-24 ENCOUNTER — Ambulatory Visit (INDEPENDENT_AMBULATORY_CARE_PROVIDER_SITE_OTHER): Payer: Medicare Other | Admitting: Pharmacist Clinician (PhC)/ Clinical Pharmacy Specialist

## 2015-03-24 DIAGNOSIS — I480 Paroxysmal atrial fibrillation: Secondary | ICD-10-CM | POA: Diagnosis not present

## 2015-03-24 LAB — POCT INR: INR: 2.3

## 2015-04-18 ENCOUNTER — Ambulatory Visit (INDEPENDENT_AMBULATORY_CARE_PROVIDER_SITE_OTHER): Payer: Medicare Other | Admitting: Pharmacist Clinician (PhC)/ Clinical Pharmacy Specialist

## 2015-04-18 DIAGNOSIS — I480 Paroxysmal atrial fibrillation: Secondary | ICD-10-CM

## 2015-04-18 LAB — POCT INR: INR: 2.3

## 2015-05-16 ENCOUNTER — Ambulatory Visit (INDEPENDENT_AMBULATORY_CARE_PROVIDER_SITE_OTHER): Payer: Medicare Other | Admitting: Pharmacist Clinician (PhC)/ Clinical Pharmacy Specialist

## 2015-05-16 DIAGNOSIS — I480 Paroxysmal atrial fibrillation: Secondary | ICD-10-CM

## 2015-05-16 LAB — POCT INR: INR: 2.5

## 2015-05-31 ENCOUNTER — Encounter: Payer: Self-pay | Admitting: *Deleted

## 2015-06-13 ENCOUNTER — Ambulatory Visit (INDEPENDENT_AMBULATORY_CARE_PROVIDER_SITE_OTHER): Payer: Medicare Other | Admitting: Pharmacist Clinician (PhC)/ Clinical Pharmacy Specialist

## 2015-06-13 DIAGNOSIS — I480 Paroxysmal atrial fibrillation: Secondary | ICD-10-CM | POA: Diagnosis not present

## 2015-06-13 LAB — POCT INR: INR: 2.5

## 2015-07-01 ENCOUNTER — Encounter: Payer: Self-pay | Admitting: *Deleted

## 2015-07-15 ENCOUNTER — Other Ambulatory Visit: Payer: Self-pay | Admitting: Pharmacist Clinician (PhC)/ Clinical Pharmacy Specialist

## 2015-07-15 ENCOUNTER — Ambulatory Visit (INDEPENDENT_AMBULATORY_CARE_PROVIDER_SITE_OTHER): Payer: Medicare Other | Admitting: Pharmacist Clinician (PhC)/ Clinical Pharmacy Specialist

## 2015-07-15 ENCOUNTER — Ambulatory Visit: Payer: Medicare Other | Admitting: Pharmacist Clinician (PhC)/ Clinical Pharmacy Specialist

## 2015-07-15 DIAGNOSIS — I480 Paroxysmal atrial fibrillation: Secondary | ICD-10-CM | POA: Diagnosis not present

## 2015-07-15 LAB — POCT INR: INR: 2.6

## 2015-07-18 ENCOUNTER — Ambulatory Visit: Payer: Medicare Other | Admitting: Pharmacist Clinician (PhC)/ Clinical Pharmacy Specialist

## 2015-07-30 ENCOUNTER — Encounter (HOSPITAL_COMMUNITY): Payer: Self-pay | Admitting: *Deleted

## 2015-07-30 ENCOUNTER — Emergency Department (HOSPITAL_COMMUNITY)
Admission: EM | Admit: 2015-07-30 | Discharge: 2015-07-30 | Disposition: A | Discharge status: Home / Self Care | Payer: Medicare Other | Attending: Emergency Medicine | Admitting: Emergency Medicine

## 2015-07-30 DIAGNOSIS — R040 Epistaxis: Secondary | ICD-10-CM

## 2015-07-30 DIAGNOSIS — R791 Abnormal coagulation profile: Secondary | ICD-10-CM | POA: Diagnosis not present

## 2015-07-30 DIAGNOSIS — Z950 Presence of cardiac pacemaker: Secondary | ICD-10-CM | POA: Diagnosis not present

## 2015-07-30 DIAGNOSIS — K219 Gastro-esophageal reflux disease without esophagitis: Secondary | ICD-10-CM | POA: Insufficient documentation

## 2015-07-30 DIAGNOSIS — I10 Essential (primary) hypertension: Secondary | ICD-10-CM | POA: Diagnosis not present

## 2015-07-30 DIAGNOSIS — Z87891 Personal history of nicotine dependence: Secondary | ICD-10-CM | POA: Diagnosis not present

## 2015-07-30 DIAGNOSIS — Z87442 Personal history of urinary calculi: Secondary | ICD-10-CM | POA: Diagnosis not present

## 2015-07-30 DIAGNOSIS — I2510 Atherosclerotic heart disease of native coronary artery without angina pectoris: Secondary | ICD-10-CM | POA: Diagnosis not present

## 2015-07-30 DIAGNOSIS — Z8546 Personal history of malignant neoplasm of prostate: Secondary | ICD-10-CM | POA: Diagnosis not present

## 2015-07-30 DIAGNOSIS — Z9889 Other specified postprocedural states: Secondary | ICD-10-CM | POA: Insufficient documentation

## 2015-07-30 DIAGNOSIS — Z7901 Long term (current) use of anticoagulants: Secondary | ICD-10-CM | POA: Diagnosis not present

## 2015-07-30 DIAGNOSIS — M109 Gout, unspecified: Secondary | ICD-10-CM | POA: Diagnosis not present

## 2015-07-30 DIAGNOSIS — E119 Type 2 diabetes mellitus without complications: Secondary | ICD-10-CM | POA: Insufficient documentation

## 2015-07-30 DIAGNOSIS — Z79899 Other long term (current) drug therapy: Secondary | ICD-10-CM | POA: Diagnosis not present

## 2015-07-30 DIAGNOSIS — I4891 Unspecified atrial fibrillation: Secondary | ICD-10-CM | POA: Insufficient documentation

## 2015-07-30 LAB — CBC WITH DIFFERENTIAL/PLATELET
Basophils Absolute: 0.1 10*3/uL (ref 0.0–0.1)
Basophils Relative: 1 %
EOS ABS: 0.2 10*3/uL (ref 0.0–0.7)
Eosinophils Relative: 3 %
HEMATOCRIT: 44.3 % (ref 39.0–52.0)
HEMOGLOBIN: 14.8 g/dL (ref 13.0–17.0)
LYMPHS ABS: 1.8 10*3/uL (ref 0.7–4.0)
LYMPHS PCT: 32 %
MCH: 30.7 pg (ref 26.0–34.0)
MCHC: 33.4 g/dL (ref 30.0–36.0)
MCV: 91.9 fL (ref 78.0–100.0)
MONOS PCT: 8 %
Monocytes Absolute: 0.5 10*3/uL (ref 0.1–1.0)
NEUTROS ABS: 3.1 10*3/uL (ref 1.7–7.7)
NEUTROS PCT: 56 %
Platelets: 204 10*3/uL (ref 150–400)
RBC: 4.82 MIL/uL (ref 4.22–5.81)
RDW: 12.7 % (ref 11.5–15.5)
WBC: 5.5 10*3/uL (ref 4.0–10.5)

## 2015-07-30 LAB — PROTIME-INR
INR: 3.14 — ABNORMAL HIGH (ref 0.00–1.49)
Prothrombin Time: 31.7 seconds — ABNORMAL HIGH (ref 11.6–15.2)

## 2015-07-30 MED ORDER — OXYMETAZOLINE HCL 0.05 % NA SOLN
1.0000 | Freq: Once | NASAL | Status: AC | PRN
  Administered 2015-07-30: 1 via NASAL
  Filled 2015-07-30: qty 15

## 2015-07-30 NOTE — ED Notes (Signed)
Nose is bleeding scantly out of right nare.  Controlled by pressure but resumes after a few minutes.

## 2015-07-30 NOTE — Discharge Instructions (Signed)
Nosebleed Nosebleeds can be caused by many conditions, including trauma, infections, polyps, foreign bodies, dry mucous membranes or climate, medicines, and air conditioning. Most nosebleeds occur in the front of the nose. Because of this location, most nosebleeds can be controlled by pinching the nostrils gently and continuously for at least 10 to 20 minutes. The long, continuous pressure allows enough time for the blood to clot. If pressure is released during that 10 to 20 minute time period, the process may have to be started again. The nosebleed may stop by itself or quit with pressure, or it may need concentrated heating (cautery) or pressure from packing. HOME CARE INSTRUCTIONS   If your nose was packed, try to maintain the pack inside until your health care provider removes it. If a gauze pack was used and it starts to fall out, gently replace it or cut the end off. Do not cut if a balloon catheter was used to pack the nose. Otherwise, do not remove unless instructed.  Avoid blowing your nose for 12 hours after treatment. This could dislodge the pack or clot and start the bleeding again.  If the bleeding starts again, sit up and bend forward, gently pinching the front half of your nose continuously for 20 minutes.  If bleeding was caused by dry mucous membranes, use over-the-counter saline nasal spray or gel. This will keep the mucous membranes moist and allow them to heal. If you must use a lubricant, choose the water-soluble variety. Use it only sparingly and not within several hours of lying down.  Do not use petroleum jelly or mineral oil, as these may drip into the lungs and cause serious problems.  Maintain humidity in your home by using less air conditioning or by using a humidifier.  Do not use aspirin or medicines which make bleeding more likely. Your health care provider can give you recommendations on this.  Resume normal activities as you are able, but try to avoid straining,  lifting, or bending at the waist for several days.  If the nosebleeds become recurrent and the cause is unknown, your health care provider may suggest laboratory tests. SEEK MEDICAL CARE IF: You have a fever. SEEK IMMEDIATE MEDICAL CARE IF:   Bleeding recurs and cannot be controlled.  There is unusual bleeding from or bruising on other parts of the body.  Nosebleeds continue.  There is any worsening of the condition which originally brought you in.  You become light-headed, feel faint, become sweaty, or vomit blood. MAKE SURE YOU:   Understand these instructions.  Will watch your condition.  Will get help right away if you are not doing well or get worse. Document Released: 08/01/2005 Document Revised: 03/08/2014 Document Reviewed: 09/22/2009 [facility] Patient Information 2015 [place], [place]. This information is not intended to replace advice given to you by your health care provider. Make sure you discuss any questions you have with your health care provider.  Do not take your Coumadin for the next 2 days and follow up closely with her primary care provider to discuss possible changes to dosing.

## 2015-07-30 NOTE — ED Notes (Signed)
No bleeding noted at this time

## 2015-07-30 NOTE — ED Notes (Signed)
Pt states nose bleed began last night and has had several episodes. Pt is on coumadin. Bleeding to right nares each time.

## 2015-07-30 NOTE — ED Provider Notes (Signed)
CSN: [id number]     Arrival date & time 07/30/15  1436 History   First MD Initiated Contact with Patient 07/30/15 1449     Chief Complaint  Patient presents with  . Epistaxis     (Consider location/radiation/quality/duration/timing/severity/associated sxs/prior Treatment) Patient is a 81 y.o. male presenting with nosebleeds. The history is provided by the patient.  Epistaxis Location:  R nare Severity:  Mild Duration:  1 day Timing:  Intermittent Progression:  Unchanged Chronicity:  New Context: anticoagulants (coumadin)   Context: not nose picking and not trauma   Relieved by:  Nothing Ineffective treatments:  Applying pressure Associated symptoms: no blood in oropharynx, no congestion, no cough, no dizziness, no fever and no syncope   Risk factors: no frequent nosebleeds     Past Medical History  Diagnosis Date  . Pacemaker     medtronic  . CAD (coronary artery disease)     echo 09-05-2010-EF nl, mod-severe dilated Left atrium; myoview 02-08-12-EF 52%, no ischemia  . HTN (hypertension)   . Barrett's esophagus   . Gout   . Arthritis   . History of cardioversion 02/09/13    electrical synchronized cardioversion, on flecainide and warfarin  . H/O transesophageal echocardiography (TEE) for monitoring 09/05/10    tee guided AT pace termination, did not proceed with cardioversion due to termination of the atrial flutter through his pacemaker  . Atrial fibrillation   . History of stomach ulcers     many yrs ago  . GERD (gastroesophageal reflux disease)   . History of kidney stones     per pt - no actual diagnosisi  . Cancer     prostate - radiation only  . Pacemaker 09-25-13  . Prostate cancer 09-25-13    tx. with radiation only- dx. 2013  . Diabetes mellitus without complication     "prediabetic"-no oral meds   Past Surgical History  Procedure Laterality Date  . Pacemaker insertion      medtronic adapta- Dr. Sallyanne Kuster follows- LOV 9'14  . Tonsillectomy    .  Appendectomy    . Cardioversion N/A 02/09/2013    Procedure: CARDIOVERSION;  Surgeon: Sanda Klein, MD;  Location: [facility];  Service: Cardiovascular;  Laterality: N/A;  . Cardiac catheterization  02/18/06    EF 65%, focal napkin ring-like lesion into the prox LAD otherwise normal coronaries  . Total knee arthroplasty Right 10/05/2013    Procedure: RIGHT TOTAL KNEE ARTHROPLASTY;  Surgeon: Gearlean Alf, MD;  Location: [facility];  Service: Orthopedics;  Laterality: Right;  . Pacemaker generator change N/A 07/27/2014    Procedure: PACEMAKER GENERATOR CHANGE;  Surgeon: Sanda Klein, MD;  Location: [name] [facility];  Service: Cardiovascular;  Laterality: N/A;   Family History  Problem Relation Age of Onset  . Diabetes Mother   . Diabetes Brother    Social History  Substance Use Topics  . Smoking status: Former Smoker    Quit date: 07/22/1970  . Smokeless tobacco: Never Used     Comment: Quit in the 57s  . Alcohol Use: No    Review of Systems  Constitutional: Negative for fever.  HENT: Positive for nosebleeds. Negative for congestion.   Respiratory: Negative for cough.   Cardiovascular: Negative for syncope.  Neurological: Negative for dizziness.  All other systems reviewed and are negative.     Allergies  No known allergies and Tape  Home Medications   Prior to Admission medications   Medication Sig Start Date End Date Taking? Authorizing Provider  allopurinol (  ZYLOPRIM) 100 MG tablet Take 1 tablet (100 mg total) by mouth at bedtime. 11/30/14  Yes Mihai Croitoru, MD  furosemide (LASIX) 20 MG tablet Take 20 mg by mouth as needed for fluid or edema.    Yes Historical Provider, MD  metoprolol succinate (TOPROL-XL) 50 MG 24 hr tablet Take 1 tablet (50 mg total) by mouth daily. 12/23/14  Yes Mihai Croitoru, MD  Misc Natural Products (TART CHERRY ADVANCED PO) Take 1,200 mg by mouth daily.    Yes Historical Provider, MD  Omega-3 Fatty Acids (OMEGA 3 PO) Take 90 mg by mouth daily.    Yes  Historical Provider, MD  pantoprazole (PROTONIX) 40 MG tablet Take 1 tablet (40 mg total) by mouth daily. 11/30/14  Yes Mihai Croitoru, MD  Potassium 99 MG TABS Take 1 tablet by mouth every other day.    Yes Historical Provider, MD  warfarin (COUMADIN) 2.5 MG tablet TAKE 1 TABLET BY MOUTH DAILY OR AS DIRECTED BY COUMADIN CLINIC Patient taking differently: TAKE 1 TABLET BY MOUTH DAILY EXCEPT FOR TUESDAY AND SATURDAY 1.25MG . 07/15/15  Yes Mihai Croitoru, MD  acetaminophen (TYLENOL) 650 MG CR tablet Take 650 mg by mouth every 8 (eight) hours as needed for pain.     Historical Provider, MD  calcium carbonate (OS-CAL) 600 MG TABS tablet Take 600 mg by mouth every other day.     Historical Provider, MD   BP 178/99 mmHg  Pulse 78  Temp(Src) 97.8 F (36.6 C) (Oral)  Resp 20  Ht 5\' 6"  (1.676 m)  Wt 190 lb (86.183 kg)  BMI 30.68 kg/m2  SpO2 96% Physical Exam  Constitutional: He is oriented to person, place, and time. He appears well-developed and well-nourished. No distress.  HENT:  Head: Normocephalic and atraumatic.  Nose: No mucosal edema, nasal deformity, septal deviation or nasal septal hematoma. Epistaxis (dried blood in right nare with small oozing in nterior portion) is observed.  Eyes: Conjunctivae are normal.  Neck: Neck supple. No tracheal deviation present.  Cardiovascular: Normal rate and regular rhythm.   Pulmonary/Chest: Effort normal. No respiratory distress.  Abdominal: Soft. He exhibits no distension.  Neurological: He is alert and oriented to person, place, and time.  Skin: Skin is warm and dry.  Psychiatric: He has a normal mood and affect.    ED Course  EPISTAXIS MANAGEMENT Date/Time: 07/30/2015 5:29 PM Performed by: Leo Grosser Authorized by: Leo Grosser Consent: Verbal consent obtained. Risks and benefits: risks, benefits and alternatives were discussed Consent given by: patient Patient understanding: patient states understanding of the procedure being  performed Required items: required blood products, implants, devices, and special equipment available Patient identity confirmed: verbally with patient, provided demographic data, arm band and hospital-assigned identification number Patient sedated: no Treatment site: right anterior Repair method: merocel sponge Post-procedure assessment: bleeding stopped Treatment complexity: simple Recurrence: recurrence of recent bleed Patient tolerance: Patient tolerated the procedure well with no immediate complications   (including critical care time) Labs Review Labs Reviewed  PROTIME-INR - Abnormal; Notable for the following:    Prothrombin Time 31.7 (*)    INR 3.14 (*)    All other components within normal limits  CBC WITH DIFFERENTIAL/PLATELET    Imaging Review No results found. I have personally reviewed and evaluated these images and lab results as part of my medical decision-making.   EKG Interpretation None      MDM   Final diagnoses:  Right-sided epistaxis  Supratherapeutic INR    81 year old male presents with bright nature  epistaxis that started last night and was intermittent over the course of the morning. It is oozing slowly on arrival. It appears isolated to the right knee repair. Attempted direct pressure and Afrin with inability to achieve hemostasis. Notably the patient is mildly supratherapeutic on his INR which is likely contributing to the ongoing bleeding. He is not currently anemic or having any other signs of complication of this. I recommended that he hold his Coumadin for the next 2 days awaiting follow-up with his primary care physician. Nasal packing was applied to the affected side with good hemostasis and improvement of symptoms. Plan to follow up with PCP as needed and return precautions discussed for worsening or new concerning symptoms.   Leo Grosser, MD 07/30/15 [phone]

## 2015-07-31 ENCOUNTER — Emergency Department (HOSPITAL_COMMUNITY)
Admission: EM | Admit: 2015-07-31 | Discharge: 2015-07-31 | Disposition: A | Discharge status: Home / Self Care | Payer: Medicare Other | Attending: Emergency Medicine | Admitting: Emergency Medicine

## 2015-07-31 ENCOUNTER — Encounter (HOSPITAL_COMMUNITY): Payer: Self-pay | Admitting: *Deleted

## 2015-07-31 DIAGNOSIS — Z8546 Personal history of malignant neoplasm of prostate: Secondary | ICD-10-CM | POA: Insufficient documentation

## 2015-07-31 DIAGNOSIS — Z87891 Personal history of nicotine dependence: Secondary | ICD-10-CM | POA: Diagnosis not present

## 2015-07-31 DIAGNOSIS — M109 Gout, unspecified: Secondary | ICD-10-CM | POA: Insufficient documentation

## 2015-07-31 DIAGNOSIS — E119 Type 2 diabetes mellitus without complications: Secondary | ICD-10-CM | POA: Insufficient documentation

## 2015-07-31 DIAGNOSIS — I10 Essential (primary) hypertension: Secondary | ICD-10-CM | POA: Diagnosis not present

## 2015-07-31 DIAGNOSIS — K219 Gastro-esophageal reflux disease without esophagitis: Secondary | ICD-10-CM | POA: Insufficient documentation

## 2015-07-31 DIAGNOSIS — I2510 Atherosclerotic heart disease of native coronary artery without angina pectoris: Secondary | ICD-10-CM | POA: Insufficient documentation

## 2015-07-31 DIAGNOSIS — Z7901 Long term (current) use of anticoagulants: Secondary | ICD-10-CM | POA: Diagnosis not present

## 2015-07-31 DIAGNOSIS — M1990 Unspecified osteoarthritis, unspecified site: Secondary | ICD-10-CM | POA: Diagnosis not present

## 2015-07-31 DIAGNOSIS — Z950 Presence of cardiac pacemaker: Secondary | ICD-10-CM | POA: Insufficient documentation

## 2015-07-31 DIAGNOSIS — Z87442 Personal history of urinary calculi: Secondary | ICD-10-CM | POA: Insufficient documentation

## 2015-07-31 DIAGNOSIS — Z9889 Other specified postprocedural states: Secondary | ICD-10-CM | POA: Diagnosis not present

## 2015-07-31 DIAGNOSIS — Z79899 Other long term (current) drug therapy: Secondary | ICD-10-CM | POA: Insufficient documentation

## 2015-07-31 DIAGNOSIS — Z923 Personal history of irradiation: Secondary | ICD-10-CM | POA: Diagnosis not present

## 2015-07-31 DIAGNOSIS — R040 Epistaxis: Secondary | ICD-10-CM

## 2015-07-31 DIAGNOSIS — I4891 Unspecified atrial fibrillation: Secondary | ICD-10-CM | POA: Diagnosis not present

## 2015-07-31 LAB — PROTIME-INR
INR: 2.88 — ABNORMAL HIGH (ref 0.00–1.49)
Prothrombin Time: 29.7 seconds — ABNORMAL HIGH (ref 11.6–15.2)

## 2015-07-31 LAB — CBC WITH DIFFERENTIAL/PLATELET
BASOS ABS: 0 10*3/uL (ref 0.0–0.1)
Basophils Relative: 1 %
Eosinophils Absolute: 0.1 10*3/uL (ref 0.0–0.7)
Eosinophils Relative: 2 %
HCT: 42.1 % (ref 39.0–52.0)
HEMOGLOBIN: 14.1 g/dL (ref 13.0–17.0)
LYMPHS ABS: 1.8 10*3/uL (ref 0.7–4.0)
LYMPHS PCT: 30 %
MCH: 30.5 pg (ref 26.0–34.0)
MCHC: 33.5 g/dL (ref 30.0–36.0)
MCV: 90.9 fL (ref 78.0–100.0)
Monocytes Absolute: 0.5 10*3/uL (ref 0.1–1.0)
Monocytes Relative: 8 %
NEUTROS ABS: 3.5 10*3/uL (ref 1.7–7.7)
NEUTROS PCT: 59 %
Platelets: 188 10*3/uL (ref 150–400)
RBC: 4.63 MIL/uL (ref 4.22–5.81)
RDW: 12.9 % (ref 11.5–15.5)
WBC: 5.9 10*3/uL (ref 4.0–10.5)

## 2015-07-31 MED ORDER — OXYMETAZOLINE HCL 0.05 % NA SOLN: 1.0000 | Freq: Once | NASAL | Status: AC

## 2015-07-31 MED ORDER — SILVER NITRATE-POT NITRATE 75-25 % EX MISC
1.0000 | Freq: Once | CUTANEOUS | Status: AC | PRN
  Administered 2015-07-31: 1 via TOPICAL
  Filled 2015-07-31: qty 1

## 2015-07-31 MED ORDER — LIDOCAINE-EPINEPHRINE (PF) 1 %-1:200000 IJ SOLN
0.0000 mL | Freq: Once | INTRAMUSCULAR | Status: AC | PRN
  Administered 2015-07-31: 10 mL via INTRADERMAL

## 2015-07-31 MED ORDER — LIDOCAINE HCL 2 % EX GEL
1.0000 "application " | Freq: Once | CUTANEOUS | Status: AC | PRN
  Administered 2015-07-31: 1 via TOPICAL
  Filled 2015-07-31: qty 10

## 2015-07-31 MED ORDER — TRIPLE ANTIBIOTIC 3.5-400-5000 EX OINT
1.0000 "application " | TOPICAL_OINTMENT | Freq: Once | CUTANEOUS | Status: AC | PRN
  Administered 2015-07-31: 1 via CUTANEOUS
  Filled 2015-07-31: qty 1

## 2015-07-31 MED ORDER — TRANEXAMIC ACID 1000 MG/10ML IV SOLN
500.0000 mg | Freq: Once | INTRAVENOUS | Status: DC
  Filled 2015-07-31: qty 10

## 2015-07-31 MED ORDER — OXYMETAZOLINE HCL 0.05 % NA SOLN
1.0000 | Freq: Once | NASAL | Status: AC | PRN
  Administered 2015-07-31: 1 via NASAL
  Filled 2015-07-31: qty 15

## 2015-07-31 MED ORDER — LIDOCAINE HCL 4 % EX SOLN
0.0000 mL | Freq: Once | CUTANEOUS | Status: DC | PRN
  Filled 2015-07-31: qty 50

## 2015-07-31 NOTE — Discharge Instructions (Signed)

## 2015-07-31 NOTE — ED Notes (Signed)
Pt here yesterday and had packing placed. States bleeding noticed again at 0600. Bleeding has slowed since arriving.

## 2015-07-31 NOTE — ED Provider Notes (Signed)
CSN: [id number]     Arrival date & time 07/31/15  2440 History   First MD Initiated Contact with Patient 07/31/15 [phone]     Chief Complaint  Patient presents with  . Epistaxis     (Consider location/radiation/quality/duration/timing/severity/associated sxs/prior Treatment) Patient is a 81 y.o. male presenting with nosebleeds. The history is provided by the patient. No language interpreter was used.  Epistaxis Location:  R nare Severity:  Mild Duration:  2 days Timing:  Constant Progression:  Partially resolved Chronicity:  Recurrent Context: anticoagulants   Relieved by:  Nothing Worsened by:  Nothing tried Ineffective treatments:  Nasal tampon Associated symptoms: blood in oropharynx   Risk factors: no change in medication   Pt seen here yesterday and had packing placed. Pt has bleeding down throat and out other side of nose.  Past Medical History  Diagnosis Date  . Pacemaker     medtronic  . CAD (coronary artery disease)     echo 09-05-2010-EF nl, mod-severe dilated Left atrium; myoview 02-08-12-EF 52%, no ischemia  . HTN (hypertension)   . Barrett's esophagus   . Gout   . Arthritis   . History of cardioversion 02/09/13    electrical synchronized cardioversion, on flecainide and warfarin  . H/O transesophageal echocardiography (TEE) for monitoring 09/05/10    tee guided AT pace termination, did not proceed with cardioversion due to termination of the atrial flutter through his pacemaker  . Atrial fibrillation   . History of stomach ulcers     many yrs ago  . GERD (gastroesophageal reflux disease)   . History of kidney stones     per pt - no actual diagnosisi  . Cancer     prostate - radiation only  . Pacemaker 09-25-13  . Prostate cancer 09-25-13    tx. with radiation only- dx. 2013  . Diabetes mellitus without complication     "prediabetic"-no oral meds   Past Surgical History  Procedure Laterality Date  . Pacemaker insertion      medtronic adapta- Dr. Sallyanne Kuster  follows- LOV 9'14  . Tonsillectomy    . Appendectomy    . Cardioversion N/A 02/09/2013    Procedure: CARDIOVERSION;  Surgeon: Sanda Klein, MD;  Location: [facility];  Service: Cardiovascular;  Laterality: N/A;  . Cardiac catheterization  02/18/06    EF 65%, focal napkin ring-like lesion into the prox LAD otherwise normal coronaries  . Total knee arthroplasty Right 10/05/2013    Procedure: RIGHT TOTAL KNEE ARTHROPLASTY;  Surgeon: Gearlean Alf, MD;  Location: [facility];  Service: Orthopedics;  Laterality: Right;  . Pacemaker generator change N/A 07/27/2014    Procedure: PACEMAKER GENERATOR CHANGE;  Surgeon: Sanda Klein, MD;  Location: [place] [facility];  Service: Cardiovascular;  Laterality: N/A;   Family History  Problem Relation Age of Onset  . Diabetes Mother   . Diabetes Brother    Social History  Substance Use Topics  . Smoking status: Former Smoker    Quit date: 07/22/1970  . Smokeless tobacco: Never Used     Comment: Quit in the 18s  . Alcohol Use: No    Review of Systems  HENT: Positive for nosebleeds.   All other systems reviewed and are negative.     Allergies  No known allergies and Tape  Home Medications   Prior to Admission medications   Medication Sig Start Date End Date Taking? Authorizing Provider  acetaminophen (TYLENOL) 650 MG CR tablet Take 650 mg by mouth every 8 (eight) hours as needed  for pain.    Yes Historical Provider, MD  allopurinol (ZYLOPRIM) 100 MG tablet Take 1 tablet (100 mg total) by mouth at bedtime. 11/30/14  Yes Mihai Croitoru, MD  calcium carbonate (OS-CAL) 600 MG TABS tablet Take 600 mg by mouth every other day.    Yes Historical Provider, MD  furosemide (LASIX) 20 MG tablet Take 20 mg by mouth as needed for fluid or edema.    Yes Historical Provider, MD  metoprolol succinate (TOPROL-XL) 50 MG 24 hr tablet Take 1 tablet (50 mg total) by mouth daily. 12/23/14  Yes Mihai Croitoru, MD  Misc Natural Products (TART CHERRY ADVANCED PO) Take 1,200  mg by mouth daily.    Yes Historical Provider, MD  Omega-3 Fatty Acids (OMEGA 3 PO) Take 90 mg by mouth daily.    Yes Historical Provider, MD  pantoprazole (PROTONIX) 40 MG tablet Take 1 tablet (40 mg total) by mouth daily. 11/30/14  Yes Mihai Croitoru, MD  Potassium 99 MG TABS Take 1 tablet by mouth every other day.    Yes Historical Provider, MD  warfarin (COUMADIN) 2.5 MG tablet TAKE 1 TABLET BY MOUTH DAILY OR AS DIRECTED BY COUMADIN CLINIC Patient taking differently: TAKE 1 TABLET BY MOUTH DAILY EXCEPT FOR TUESDAY AND SATURDAY 1.25MG . 07/15/15  Yes Mihai Croitoru, MD   BP 180/98 mmHg  Temp(Src) 98.2 F (36.8 C) (Oral)  Resp 16  SpO2 100% Physical Exam  Constitutional: He is oriented to person, place, and time. He appears well-developed and well-nourished.  HENT:  Head: Normocephalic and atraumatic.  Small amount of blood in throat.  Nasal tampoon removed.    Eyes: EOM are normal.  Neck: Normal range of motion.  Pulmonary/Chest: Effort normal.  Abdominal: He exhibits no distension.  Musculoskeletal: Normal range of motion.  Neurological: He is alert and oriented to person, place, and time.  Psychiatric: He has a normal mood and affect.  Nursing note and vitals reviewed.   ED Course  EPISTAXIS MANAGEMENT Date/Time: 07/31/2015 4:08 PM Performed by: Fransico Meadow Authorized by: Fransico Meadow Consent: Verbal consent obtained. Risks and benefits: risks, benefits and alternatives were discussed Consent given by: patient Patient understanding: patient states understanding of the procedure being performed Required items: required blood products, implants, devices, and special equipment available Time out: Immediately prior to procedure a "time out" was called to verify the correct patient, procedure, equipment, support staff and site/side marked as required. Local anesthetic: topical anesthetic Patient sedated: no Treatment site: right anterior Repair method: nasal balloon and  anterior pack Treatment complexity: simple Recurrence: recurrence of recent bleed Patient tolerance: Patient tolerated the procedure well with no immediate complications Comments: Pt observed.  Pt had decreased bleeding, Pt reports small amount of blood in throat.   Pt observed and bleeding decreasd.   (including critical care time) Labs Review Labs Reviewed  PROTIME-INR - Abnormal; Notable for the following:    Prothrombin Time 29.7 (*)    INR 2.88 (*)    All other components within normal limits  CBC WITH DIFFERENTIAL/PLATELET    Imaging Review No results found. I have personally reviewed and evaluated these images and lab results as part of my medical decision-making.   EKG Interpretation None      MDM Pt advised packing removal in 2 days See Primary MD for recheck tomorrow.    Final diagnoses:  Epistaxis        Fransico Meadow, PA-C 07/31/15 1615  Ezequiel Essex, MD 07/31/15 [phone]

## 2015-08-02 DIAGNOSIS — E782 Mixed hyperlipidemia: Secondary | ICD-10-CM | POA: Diagnosis not present

## 2015-08-02 DIAGNOSIS — R040 Epistaxis: Secondary | ICD-10-CM | POA: Diagnosis not present

## 2015-08-02 DIAGNOSIS — Z7901 Long term (current) use of anticoagulants: Secondary | ICD-10-CM | POA: Diagnosis not present

## 2015-08-02 DIAGNOSIS — I10 Essential (primary) hypertension: Secondary | ICD-10-CM | POA: Diagnosis not present

## 2015-08-02 DIAGNOSIS — I4891 Unspecified atrial fibrillation: Secondary | ICD-10-CM | POA: Diagnosis not present

## 2015-08-02 DIAGNOSIS — I495 Sick sinus syndrome: Secondary | ICD-10-CM | POA: Diagnosis not present

## 2015-08-02 DIAGNOSIS — Z6832 Body mass index (BMI) 32.0-32.9, adult: Secondary | ICD-10-CM | POA: Diagnosis not present

## 2015-08-02 DIAGNOSIS — E669 Obesity, unspecified: Secondary | ICD-10-CM | POA: Diagnosis not present

## 2015-08-02 DIAGNOSIS — Z1389 Encounter for screening for other disorder: Secondary | ICD-10-CM | POA: Diagnosis not present

## 2015-08-02 DIAGNOSIS — Z125 Encounter for screening for malignant neoplasm of prostate: Secondary | ICD-10-CM | POA: Diagnosis not present

## 2015-08-12 ENCOUNTER — Ambulatory Visit (INDEPENDENT_AMBULATORY_CARE_PROVIDER_SITE_OTHER): Payer: Medicare Other | Admitting: Pharmacist Clinician (PhC)/ Clinical Pharmacy Specialist

## 2015-08-12 DIAGNOSIS — Z7901 Long term (current) use of anticoagulants: Secondary | ICD-10-CM

## 2015-08-12 DIAGNOSIS — I480 Paroxysmal atrial fibrillation: Secondary | ICD-10-CM | POA: Diagnosis not present

## 2015-08-12 LAB — POCT INR: INR: 1.2

## 2015-08-15 ENCOUNTER — Ambulatory Visit (INDEPENDENT_AMBULATORY_CARE_PROVIDER_SITE_OTHER): Payer: Medicare Other | Admitting: *Deleted

## 2015-08-15 DIAGNOSIS — Z950 Presence of cardiac pacemaker: Secondary | ICD-10-CM | POA: Diagnosis not present

## 2015-08-15 DIAGNOSIS — R001 Bradycardia, unspecified: Secondary | ICD-10-CM

## 2015-08-15 LAB — CUP PACEART INCLINIC DEVICE CHECK
Battery Impedance: 113 Ohm
Battery Remaining Longevity: 155 mo
Battery Voltage: 2.78 V
Brady Statistic AP VP Percent: 0 %
Brady Statistic AS VS Percent: 2 %
Date Time Interrogation Session: 20161010160305
Lead Channel Impedance Value: 488 Ohm
Lead Channel Pacing Threshold Amplitude: 0.5 V
Lead Channel Sensing Intrinsic Amplitude: 4 mV
Lead Channel Setting Pacing Amplitude: 1.5 V
Lead Channel Setting Pacing Amplitude: 2 V
Lead Channel Setting Pacing Pulse Width: 0.4 ms
Lead Channel Setting Sensing Sensitivity: 5.6 mV
MDC IDC MSMT LEADCHNL RA PACING THRESHOLD PULSEWIDTH: 0.4 ms
MDC IDC MSMT LEADCHNL RV IMPEDANCE VALUE: 659 Ohm
MDC IDC MSMT LEADCHNL RV PACING THRESHOLD AMPLITUDE: 1 V
MDC IDC MSMT LEADCHNL RV PACING THRESHOLD PULSEWIDTH: 0.4 ms
MDC IDC MSMT LEADCHNL RV SENSING INTR AMPL: 22.4 mV
MDC IDC STAT BRADY AP VS PERCENT: 98 %
MDC IDC STAT BRADY AS VP PERCENT: 0 %

## 2015-08-15 NOTE — Progress Notes (Signed)
Pacemaker check in clinic. Normal device function. Thresholds, sensing, impedances consistent with previous measurements. Device programmed to maximize longevity. 40 mode switches, <0.1% + warfarin. No high ventricular rates noted. Device programmed at appropriate safety margins. Histogram distribution appropriate for patient activity level. Device programmed to optimize intrinsic conduction. Estimated longevity 70yrs. ROV w/ [facility] 11/22/15.

## 2015-08-26 ENCOUNTER — Ambulatory Visit (INDEPENDENT_AMBULATORY_CARE_PROVIDER_SITE_OTHER): Payer: Medicare Other | Admitting: Pharmacist Clinician (PhC)/ Clinical Pharmacy Specialist

## 2015-08-26 DIAGNOSIS — I480 Paroxysmal atrial fibrillation: Secondary | ICD-10-CM

## 2015-08-26 DIAGNOSIS — Z7901 Long term (current) use of anticoagulants: Secondary | ICD-10-CM | POA: Diagnosis not present

## 2015-08-26 LAB — POCT INR: INR: 1.9

## 2015-09-16 ENCOUNTER — Ambulatory Visit (INDEPENDENT_AMBULATORY_CARE_PROVIDER_SITE_OTHER): Payer: Medicare Other | Admitting: Pharmacist Clinician (PhC)/ Clinical Pharmacy Specialist

## 2015-09-16 DIAGNOSIS — Z7901 Long term (current) use of anticoagulants: Secondary | ICD-10-CM | POA: Diagnosis not present

## 2015-09-16 DIAGNOSIS — I480 Paroxysmal atrial fibrillation: Secondary | ICD-10-CM | POA: Diagnosis not present

## 2015-09-16 LAB — POCT INR: INR: 2.2

## 2015-10-12 ENCOUNTER — Encounter: Payer: Self-pay | Admitting: Cardiovascular Disease

## 2015-10-14 ENCOUNTER — Ambulatory Visit (INDEPENDENT_AMBULATORY_CARE_PROVIDER_SITE_OTHER): Payer: Medicare Other | Admitting: Pharmacist Clinician (PhC)/ Clinical Pharmacy Specialist

## 2015-10-14 DIAGNOSIS — Z7901 Long term (current) use of anticoagulants: Secondary | ICD-10-CM

## 2015-10-14 DIAGNOSIS — I480 Paroxysmal atrial fibrillation: Secondary | ICD-10-CM

## 2015-10-14 LAB — POCT INR: INR: 3

## 2015-10-24 ENCOUNTER — Telehealth: Payer: Self-pay | Admitting: Cardiovascular Disease

## 2015-10-24 NOTE — Telephone Encounter (Deleted)
error 

## 2015-11-11 ENCOUNTER — Ambulatory Visit (INDEPENDENT_AMBULATORY_CARE_PROVIDER_SITE_OTHER): Payer: Medicare Other | Admitting: Pharmacist Clinician (PhC)/ Clinical Pharmacy Specialist

## 2015-11-11 DIAGNOSIS — Z7901 Long term (current) use of anticoagulants: Secondary | ICD-10-CM

## 2015-11-11 DIAGNOSIS — I480 Paroxysmal atrial fibrillation: Secondary | ICD-10-CM

## 2015-11-11 LAB — POCT INR: INR: 2.8

## 2015-11-22 ENCOUNTER — Encounter: Payer: Medicare Other | Admitting: Cardiovascular Disease

## 2015-11-22 DIAGNOSIS — Z1389 Encounter for screening for other disorder: Secondary | ICD-10-CM | POA: Diagnosis not present

## 2015-11-22 DIAGNOSIS — J329 Chronic sinusitis, unspecified: Secondary | ICD-10-CM | POA: Diagnosis not present

## 2015-11-22 DIAGNOSIS — Z6832 Body mass index (BMI) 32.0-32.9, adult: Secondary | ICD-10-CM | POA: Diagnosis not present

## 2015-11-22 DIAGNOSIS — E1129 Type 2 diabetes mellitus with other diabetic kidney complication: Secondary | ICD-10-CM | POA: Diagnosis not present

## 2015-11-22 DIAGNOSIS — R05 Cough: Secondary | ICD-10-CM | POA: Diagnosis not present

## 2015-11-22 DIAGNOSIS — E6609 Other obesity due to excess calories: Secondary | ICD-10-CM | POA: Diagnosis not present

## 2015-12-06 ENCOUNTER — Ambulatory Visit (INDEPENDENT_AMBULATORY_CARE_PROVIDER_SITE_OTHER): Payer: Medicare Other | Admitting: Pharmacist Clinician (PhC)/ Clinical Pharmacy Specialist

## 2015-12-06 ENCOUNTER — Encounter: Payer: Self-pay | Admitting: Cardiovascular Disease

## 2015-12-06 ENCOUNTER — Ambulatory Visit (INDEPENDENT_AMBULATORY_CARE_PROVIDER_SITE_OTHER): Payer: Medicare Other | Admitting: Cardiovascular Disease

## 2015-12-06 VITALS — BP 160/88 | HR 66 | Ht 66.0 in | Wt 198.1 lb

## 2015-12-06 DIAGNOSIS — I495 Sick sinus syndrome: Secondary | ICD-10-CM

## 2015-12-06 DIAGNOSIS — Z7901 Long term (current) use of anticoagulants: Secondary | ICD-10-CM | POA: Diagnosis not present

## 2015-12-06 DIAGNOSIS — I480 Paroxysmal atrial fibrillation: Secondary | ICD-10-CM

## 2015-12-06 DIAGNOSIS — I4581 Long QT syndrome: Secondary | ICD-10-CM | POA: Diagnosis not present

## 2015-12-06 DIAGNOSIS — R9431 Abnormal electrocardiogram [ECG] [EKG]: Secondary | ICD-10-CM

## 2015-12-06 DIAGNOSIS — I10 Essential (primary) hypertension: Secondary | ICD-10-CM | POA: Diagnosis not present

## 2015-12-06 DIAGNOSIS — I5032 Chronic diastolic (congestive) heart failure: Secondary | ICD-10-CM

## 2015-12-06 LAB — POCT INR: INR: 2.6

## 2015-12-06 NOTE — Progress Notes (Signed)
Patient ID: [name], male   DOB: 03-01-1934, 82 y.o.   MRN: [id number]    Cardiology Office Note    Date:  12/06/2015   ID:  [name], DOB [birth year], MRN [id number]  PCP:  Wayne Kilts, MD  Cardiologist:   Wayne Klein, MD   Chief Complaint  Patient presents with  . Annual Exam    no chest pain, no shortness of breath, no edema, no pain or cramping in legs, no lightheadedness or dizziness    History of Present Illness:  [name] is a 82 y.o. male with recurrent persistent paroxysmal atrial fibrillation and tachycardia-bradycardia syndrome who returns for pacemaker check. He denies exertional angina or dyspnea. He does not have edema. He had severe epistaxis in September but this has not recurred. He continues to take warfarin and has not had other bleeding problems. Remarkably, he is maintaining sinus rhythm in the long run, despite being off all antiarrhythmics (he had severe flecainide toxicity with congestive heart failure).  Pacemaker interrogation shows normal device function. He has 98.5% atrial pacing but only 0.1% ventricular pacing. Generator longevity is estimated that another 13 years. He has had 7 episodes of mode switch but also more less than 1 minute and there are no electrograms for review.  He has gained some weight. His hemoglobin A1c is just over 6% but he still not requiring medications for diabetes mellitus. His blood pressure is elevated today, but when he checks it at home and he is relaxed it is consistently in the 130-135/70 range.  He has a history of recurrent persistent atrial fibrillation, hypertension, GERD, diabetes, dual chamber Medtronic pacemaker for SSS. On 07/27/2014 the patient underwent generator change out.EF has usually been 50-55%, but dropped to 40-45% when he had broad QRS/flecainide toxicity. He did not have ischemia by his most recent nuclear stress test, EF normalized off flecainide. In 2007, cardiac catheterization  showed a 40% proximal LAD stenosis, no other significant lesions. He is on chronic warfarin anticoagulation and has not had bleeding complications or stroke. In the past has had successful cardioversion as well as pace-termination of atrial flutter via his pacemaker.  Past Medical History  Diagnosis Date  . Pacemaker     medtronic  . CAD (coronary artery disease)     echo 09-05-2010-EF nl, mod-severe dilated Left atrium; myoview 02-08-12-EF 52%, no ischemia  . HTN (hypertension)   . Barrett's esophagus   . Gout   . Arthritis   . History of cardioversion 02/09/13    electrical synchronized cardioversion, on flecainide and warfarin  . H/O transesophageal echocardiography (TEE) for monitoring 09/05/10    tee guided AT pace termination, did not proceed with cardioversion due to termination of the atrial flutter through his pacemaker  . Atrial fibrillation ([place])   . History of stomach ulcers     many yrs ago  . GERD (gastroesophageal reflux disease)   . History of kidney stones     per pt - no actual diagnosisi  . Cancer [facility])     prostate - radiation only  . Pacemaker 09-25-13  . Prostate cancer ([place]) 09-25-13    tx. with radiation only- dx. 2013  . Diabetes mellitus without complication ([place])     "prediabetic"-no oral meds    Past Surgical History  Procedure Laterality Date  . Pacemaker insertion      medtronic adapta- Dr. Sallyanne Kuster follows- LOV 9'14  . Tonsillectomy    . Appendectomy    . Cardioversion N/A  02/09/2013    Procedure: CARDIOVERSION;  Surgeon: Wayne Klein, MD;  [facility];  Service: Cardiovascular;  Laterality: N/A;  . Cardiac catheterization  02/18/06    EF 65%, focal napkin ring-like lesion into the prox LAD otherwise normal coronaries  . Total knee arthroplasty Right 10/05/2013    Procedure: RIGHT TOTAL KNEE ARTHROPLASTY;  Surgeon: Gearlean Alf, MD;  Location: [facility] ORS;  Service: Orthopedics;  Laterality: Right;  . Pacemaker generator change N/A  07/27/2014    Procedure: PACEMAKER GENERATOR CHANGE;  Surgeon: Wayne Klein, MD;  Location: [facility] CATH LAB;  Service: Cardiovascular;  Laterality: N/A;    Outpatient Prescriptions Prior to Visit  Medication Sig Dispense Refill  . acetaminophen (TYLENOL) 650 MG CR tablet Take 650 mg by mouth every 8 (eight) hours as needed for pain.     Marland Kitchen allopurinol (ZYLOPRIM) 100 MG tablet Take 1 tablet (100 mg total) by mouth at bedtime. 90 tablet 3  . calcium carbonate (OS-CAL) 600 MG TABS tablet Take 600 mg by mouth every other day.     . furosemide (LASIX) 20 MG tablet Take 20 mg by mouth as needed for fluid or edema.     . metoprolol succinate (TOPROL-XL) 50 MG 24 hr tablet Take 1 tablet (50 mg total) by mouth daily. 90 tablet 3  . Misc Natural Products (TART CHERRY ADVANCED PO) Take 1,200 mg by mouth daily.     . Omega-3 Fatty Acids (OMEGA 3 PO) Take 90 mg by mouth daily.     . pantoprazole (PROTONIX) 40 MG tablet Take 1 tablet (40 mg total) by mouth daily. 90 tablet 3  . Potassium 99 MG TABS Take 1 tablet by mouth every other day.     . warfarin (COUMADIN) 2.5 MG tablet TAKE 1 TABLET BY MOUTH DAILY OR AS DIRECTED BY COUMADIN CLINIC (Patient taking differently: TAKE 1 TABLET BY MOUTH DAILY EXCEPT FOR TUESDAY AND SATURDAY 1.25MG .) 90 tablet 1   No facility-administered medications prior to visit.     Allergies:   Shellfish-derived products; No known allergies; and Tape   Social History   Social History  . Marital Status: Married    Spouse Name: N/A  . Number of Children: N/A  . Years of Education: N/A   Social History Main Topics  . Smoking status: Former Smoker    Quit date: 07/22/1970  . Smokeless tobacco: Never Used     Comment: Quit in the 86s  . Alcohol Use: No  . Drug Use: No  . Sexual Activity: Not Currently   Other Topics Concern  . None   Social History Narrative     Family History:  The patient's family history includes Diabetes in his brother and mother.   ROS:   Please  see the history of present illness.    ROS All other systems reviewed and are negative.   PHYSICAL EXAM:   VS:  BP 160/88 mmHg  Pulse 66  Ht 5\' 6"  (1.676 m)  Wt 89.869 kg (198 lb 2 oz)  BMI 31.99 kg/m2   GEN: Well nourished, well developed, in no acute distress HEENT: normal Neck: no JVD, carotid bruits, or masses Cardiac: RRR; no murmurs, rubs, or gallops,no edema , healthy subclavian pacemaker site Respiratory:  clear to auscultation bilaterally, normal work of breathing GI: soft, nontender, nondistended, + BS MS: no deformity or atrophy Skin: warm and dry, no rash Neuro:  Alert and Oriented x 3, Strength and sensation are intact Psych: euthymic mood, full affect  Wt Readings from Last 3 Encounters:  12/06/15 89.869 kg (198 lb 2 oz)  07/30/15 86.183 kg (190 lb)  11/30/14 86.864 kg (191 lb 8 oz)      Studies/Labs Reviewed:   EKG:  EKG is ordered today.  The ekg ordered today demonstrates  atrial paced, ventricular sensed. Long AV delay 330 ms. Voltage criteria for left ventricular hypertrophy and nonspecific T-wave changes are longstanding. QTC 453 ms  Recent Labs: 07/31/2015: Hemoglobin 14.1; Platelets 188    ASSESSMENT:    1. SSS (sick sinus syndrome) (HCC)   2. Paroxysmal atrial fibrillation (HCC)   3. Long QT interval   4. Essential hypertension   5. Long term (current) use of anticoagulants   6. Chronic diastolic heart failure (HCC)      PLAN:  In order of problems listed above:  1. SSS: Heart rate histogram distributions shows appropriate treatment of sinus bradycardia/chronotropic incompetence. He does not have symptoms of bradycardia 2. AFib: Very low burden of atrial fibrillation since last device check, despite no antiarrhythmic therapy. CHADSVasc score is high: At least 4 (age 62, CHF, HTN), maybe 6 if one counts CAD and borderline diabetes 3. Long QT: Only borderline prolonged today, avoid QT prolonging agents 4. HTN: Elevated blood pressure today but  he insists that this is very unusual for him. Have asked him to check his blood pressure at home and send me a weeks worth of recordings at rest. He tells me that his home blood pressure monitor has been checked against an office monitor 5. Well-tolerated anticoagulation with infrequent and relatively mild bleeding problems 6. CHF: Clinically well compensated, euvolemic on a very low dose of loop diuretic    Medication Adjustments/Labs and Tests Ordered: Current medicines are reviewed at length with the patient today.  Concerns regarding medicines are outlined above.  Medication changes, Labs and Tests ordered today are listed in the Patient Instructions below. Patient Instructions  Remote monitoring is used to monitor your Pacemaker or ICD from home. This monitoring reduces the number of office visits required to check your device to one time per year. It allows [place] to monitor the functioning of your device to ensure it is working properly. You are scheduled for a device check from home on Mar 05, 2016. You may send your transmission at any time that day. If you have a wireless device, the transmission will be sent automatically. After your physician reviews your transmission, you will receive a postcard with your next transmission date.  Dr. Sallyanne Kuster recommends that you schedule a follow-up appointment in: ONE YEAR         SignedSanda Klein, MD  12/06/2015 4:35 PM    [facility] [address], [place], [place]  [address] Phone: [phone]; Fax: [phone]

## 2015-12-06 NOTE — Patient Instructions (Signed)
Remote monitoring is used to monitor your Pacemaker or ICD from home. This monitoring reduces the number of office visits required to check your device to one time per year. It allows [place] to monitor the functioning of your device to ensure it is working properly. You are scheduled for a device check from home on Mar 05, 2016. You may send your transmission at any time that day. If you have a wireless device, the transmission will be sent automatically. After your physician reviews your transmission, you will receive a postcard with your next transmission date.  Dr. Sallyanne Kuster recommends that you schedule a follow-up appointment in: [place]

## 2015-12-07 ENCOUNTER — Other Ambulatory Visit: Payer: Self-pay | Admitting: Cardiovascular Disease

## 2015-12-08 NOTE — Telephone Encounter (Signed)
Rx request sent to pharmacy.  

## 2015-12-19 DIAGNOSIS — E119 Type 2 diabetes mellitus without complications: Secondary | ICD-10-CM | POA: Diagnosis not present

## 2015-12-19 DIAGNOSIS — H43813 Vitreous degeneration, bilateral: Secondary | ICD-10-CM | POA: Diagnosis not present

## 2015-12-19 DIAGNOSIS — H2512 Age-related nuclear cataract, left eye: Secondary | ICD-10-CM | POA: Diagnosis not present

## 2015-12-19 DIAGNOSIS — H25811 Combined forms of age-related cataract, right eye: Secondary | ICD-10-CM | POA: Diagnosis not present

## 2015-12-19 DIAGNOSIS — H52221 Regular astigmatism, right eye: Secondary | ICD-10-CM | POA: Diagnosis not present

## 2015-12-21 ENCOUNTER — Other Ambulatory Visit: Payer: Self-pay | Admitting: Pharmacist Clinician (PhC)/ Clinical Pharmacy Specialist

## 2015-12-21 MED ORDER — METOPROLOL SUCCINATE ER 50 MG PO TB24: 50.0000 mg | ORAL_TABLET | Freq: Every day | ORAL | Status: DC

## 2016-01-02 ENCOUNTER — Encounter: Payer: Medicare Other | Admitting: Pharmacist Clinician (PhC)/ Clinical Pharmacy Specialist

## 2016-01-03 ENCOUNTER — Ambulatory Visit (INDEPENDENT_AMBULATORY_CARE_PROVIDER_SITE_OTHER): Payer: Medicare Other | Admitting: Pharmacist Clinician (PhC)/ Clinical Pharmacy Specialist

## 2016-01-03 DIAGNOSIS — Z7901 Long term (current) use of anticoagulants: Secondary | ICD-10-CM

## 2016-01-03 DIAGNOSIS — I480 Paroxysmal atrial fibrillation: Secondary | ICD-10-CM

## 2016-01-03 LAB — POCT INR: INR: 3

## 2016-01-31 ENCOUNTER — Ambulatory Visit (INDEPENDENT_AMBULATORY_CARE_PROVIDER_SITE_OTHER): Payer: Medicare Other | Admitting: Pharmacist Clinician (PhC)/ Clinical Pharmacy Specialist

## 2016-01-31 DIAGNOSIS — I480 Paroxysmal atrial fibrillation: Secondary | ICD-10-CM

## 2016-01-31 DIAGNOSIS — Z7901 Long term (current) use of anticoagulants: Secondary | ICD-10-CM

## 2016-01-31 LAB — POCT INR: INR: 2.3

## 2016-01-31 MED ORDER — ALLOPURINOL 100 MG PO TABS: 100.0000 mg | ORAL_TABLET | Freq: Every day | ORAL | Status: DC

## 2016-02-22 DIAGNOSIS — E119 Type 2 diabetes mellitus without complications: Secondary | ICD-10-CM | POA: Diagnosis not present

## 2016-02-22 DIAGNOSIS — I10 Essential (primary) hypertension: Secondary | ICD-10-CM | POA: Diagnosis not present

## 2016-02-22 DIAGNOSIS — E6609 Other obesity due to excess calories: Secondary | ICD-10-CM | POA: Diagnosis not present

## 2016-02-22 DIAGNOSIS — Z6831 Body mass index (BMI) 31.0-31.9, adult: Secondary | ICD-10-CM | POA: Diagnosis not present

## 2016-02-22 DIAGNOSIS — E782 Mixed hyperlipidemia: Secondary | ICD-10-CM | POA: Diagnosis not present

## 2016-02-24 ENCOUNTER — Other Ambulatory Visit: Payer: Self-pay | Admitting: Cardiovascular Disease

## 2016-02-27 ENCOUNTER — Ambulatory Visit (INDEPENDENT_AMBULATORY_CARE_PROVIDER_SITE_OTHER): Payer: Medicare Other | Admitting: Pharmacist

## 2016-02-27 DIAGNOSIS — Z7901 Long term (current) use of anticoagulants: Secondary | ICD-10-CM | POA: Diagnosis not present

## 2016-02-27 DIAGNOSIS — I480 Paroxysmal atrial fibrillation: Secondary | ICD-10-CM | POA: Diagnosis not present

## 2016-02-27 LAB — POCT INR: INR: 3.2

## 2016-03-13 DIAGNOSIS — N182 Chronic kidney disease, stage 2 (mild): Secondary | ICD-10-CM | POA: Diagnosis not present

## 2016-03-26 ENCOUNTER — Ambulatory Visit (INDEPENDENT_AMBULATORY_CARE_PROVIDER_SITE_OTHER): Payer: Medicare Other | Admitting: Pharmacist

## 2016-03-26 DIAGNOSIS — Z7901 Long term (current) use of anticoagulants: Secondary | ICD-10-CM | POA: Diagnosis not present

## 2016-03-26 DIAGNOSIS — I480 Paroxysmal atrial fibrillation: Secondary | ICD-10-CM | POA: Diagnosis not present

## 2016-03-26 LAB — POCT INR: INR: 2.3

## 2016-04-02 LAB — CUP PACEART INCLINIC DEVICE CHECK
Battery Impedance: 113 Ohm
Battery Remaining Longevity: 156 mo
Battery Voltage: 2.79 V
Brady Statistic AP VS Percent: 99 %
Implantable Lead Implant Date: 20070417
Implantable Lead Location: 753859
Implantable Lead Location: 753860
Implantable Lead Model: 5092
Implantable Lead Model: 5594
Lead Channel Impedance Value: 509 Ohm
Lead Channel Impedance Value: 658 Ohm
MDC IDC LEAD IMPLANT DT: 20070417
MDC IDC SESS DTM: 20170131161033
MDC IDC SET LEADCHNL RA PACING AMPLITUDE: 1.5 V
MDC IDC SET LEADCHNL RV PACING AMPLITUDE: 2 V
MDC IDC SET LEADCHNL RV PACING PULSEWIDTH: 0.4 ms
MDC IDC SET LEADCHNL RV SENSING SENSITIVITY: 5.6 mV
MDC IDC STAT BRADY AP VP PERCENT: 0 %
MDC IDC STAT BRADY AS VP PERCENT: 0 %
MDC IDC STAT BRADY AS VS PERCENT: 1 %

## 2016-04-04 ENCOUNTER — Telehealth: Payer: Self-pay | Admitting: Cardiology

## 2016-04-04 ENCOUNTER — Ambulatory Visit (INDEPENDENT_AMBULATORY_CARE_PROVIDER_SITE_OTHER): Payer: Medicare Other | Admitting: *Deleted

## 2016-04-04 DIAGNOSIS — I495 Sick sinus syndrome: Secondary | ICD-10-CM | POA: Diagnosis not present

## 2016-04-04 NOTE — Telephone Encounter (Signed)
LMOVM reminding pt to send remote transmission.   

## 2016-04-09 NOTE — Progress Notes (Signed)
Remote pacemaker transmission.   

## 2016-04-30 ENCOUNTER — Ambulatory Visit (INDEPENDENT_AMBULATORY_CARE_PROVIDER_SITE_OTHER): Payer: Medicare Other | Admitting: Pharmacist Clinician (PhC)/ Clinical Pharmacy Specialist

## 2016-04-30 DIAGNOSIS — I480 Paroxysmal atrial fibrillation: Secondary | ICD-10-CM

## 2016-04-30 DIAGNOSIS — Z7901 Long term (current) use of anticoagulants: Secondary | ICD-10-CM

## 2016-04-30 LAB — POCT INR: INR: 2.7

## 2016-06-15 ENCOUNTER — Ambulatory Visit (INDEPENDENT_AMBULATORY_CARE_PROVIDER_SITE_OTHER): Payer: Medicare Other | Admitting: Pharmacist

## 2016-06-15 DIAGNOSIS — Z7901 Long term (current) use of anticoagulants: Secondary | ICD-10-CM

## 2016-06-15 DIAGNOSIS — I480 Paroxysmal atrial fibrillation: Secondary | ICD-10-CM | POA: Diagnosis not present

## 2016-06-15 LAB — POCT INR: INR: 1.8

## 2016-06-25 ENCOUNTER — Other Ambulatory Visit: Payer: Self-pay | Admitting: *Deleted

## 2016-06-25 ENCOUNTER — Other Ambulatory Visit: Payer: Self-pay | Admitting: Cardiovascular Disease

## 2016-06-25 MED ORDER — PANTOPRAZOLE SODIUM 40 MG PO TBEC: 40.0000 mg | DELAYED_RELEASE_TABLET | Freq: Every day | ORAL | 3 refills | Status: DC

## 2016-06-25 NOTE — Telephone Encounter (Signed)
Rx request sent to pharmacy.  

## 2016-06-26 ENCOUNTER — Other Ambulatory Visit: Payer: Self-pay | Admitting: Cardiovascular Disease

## 2016-07-11 ENCOUNTER — Ambulatory Visit (INDEPENDENT_AMBULATORY_CARE_PROVIDER_SITE_OTHER): Payer: Medicare Other | Admitting: *Deleted

## 2016-07-11 DIAGNOSIS — I495 Sick sinus syndrome: Secondary | ICD-10-CM

## 2016-07-11 NOTE — Progress Notes (Signed)
Remote pacemaker transmission.   

## 2016-07-13 ENCOUNTER — Ambulatory Visit (INDEPENDENT_AMBULATORY_CARE_PROVIDER_SITE_OTHER): Payer: Medicare Other | Admitting: Pharmacist Clinician (PhC)/ Clinical Pharmacy Specialist

## 2016-07-13 ENCOUNTER — Encounter: Payer: Self-pay | Admitting: Cardiology

## 2016-07-13 DIAGNOSIS — I480 Paroxysmal atrial fibrillation: Secondary | ICD-10-CM

## 2016-07-13 DIAGNOSIS — Z7901 Long term (current) use of anticoagulants: Secondary | ICD-10-CM

## 2016-07-13 LAB — POCT INR: INR: 2.4

## 2016-07-21 LAB — CUP PACEART REMOTE DEVICE CHECK
Battery Impedance: 113 Ohm
Battery Remaining Longevity: 154 mo
Battery Voltage: 2.79 V
Brady Statistic AP VP Percent: 0 %
Brady Statistic AS VP Percent: 1 %
Implantable Lead Implant Date: 20070417
Implantable Lead Location: 753859
Implantable Lead Location: 753860
Implantable Lead Model: 5594
Lead Channel Impedance Value: 480 Ohm
Lead Channel Pacing Threshold Amplitude: 0.5 V
Lead Channel Pacing Threshold Amplitude: 0.875 V
Lead Channel Pacing Threshold Pulse Width: 0.4 ms
Lead Channel Sensing Intrinsic Amplitude: 16 mV
MDC IDC LEAD IMPLANT DT: 20070417
MDC IDC MSMT LEADCHNL RV IMPEDANCE VALUE: 658 Ohm
MDC IDC MSMT LEADCHNL RV PACING THRESHOLD PULSEWIDTH: 0.4 ms
MDC IDC SESS DTM: 20170906111339
MDC IDC SET LEADCHNL RA PACING AMPLITUDE: 1.5 V
MDC IDC SET LEADCHNL RV PACING AMPLITUDE: 2 V
MDC IDC SET LEADCHNL RV PACING PULSEWIDTH: 0.4 ms
MDC IDC SET LEADCHNL RV SENSING SENSITIVITY: 5.6 mV
MDC IDC STAT BRADY AP VS PERCENT: 96 %
MDC IDC STAT BRADY AS VS PERCENT: 3 %

## 2016-08-24 ENCOUNTER — Ambulatory Visit (INDEPENDENT_AMBULATORY_CARE_PROVIDER_SITE_OTHER): Payer: Medicare Other | Admitting: Pharmacist Clinician (PhC)/ Clinical Pharmacy Specialist

## 2016-08-24 DIAGNOSIS — I480 Paroxysmal atrial fibrillation: Secondary | ICD-10-CM

## 2016-08-24 LAB — POCT INR: INR: 3

## 2016-09-19 ENCOUNTER — Ambulatory Visit (INDEPENDENT_AMBULATORY_CARE_PROVIDER_SITE_OTHER): Payer: Medicare Other | Admitting: Pharmacist

## 2016-09-19 DIAGNOSIS — I480 Paroxysmal atrial fibrillation: Secondary | ICD-10-CM | POA: Diagnosis not present

## 2016-09-19 LAB — POCT INR: INR: 2.7

## 2016-10-10 ENCOUNTER — Ambulatory Visit (INDEPENDENT_AMBULATORY_CARE_PROVIDER_SITE_OTHER): Payer: Medicare Other | Admitting: *Deleted

## 2016-10-10 ENCOUNTER — Telehealth: Payer: Self-pay | Admitting: Cardiology

## 2016-10-10 DIAGNOSIS — I495 Sick sinus syndrome: Secondary | ICD-10-CM

## 2016-10-10 NOTE — Telephone Encounter (Signed)
Spoke with pt and reminded pt of remote transmission that is due today. Pt verbalized understanding.   

## 2016-10-11 ENCOUNTER — Other Ambulatory Visit: Payer: Self-pay | Admitting: Cardiovascular Disease

## 2016-10-11 NOTE — Progress Notes (Signed)
Remote pacemaker transmission.   

## 2016-10-16 ENCOUNTER — Ambulatory Visit (INDEPENDENT_AMBULATORY_CARE_PROVIDER_SITE_OTHER): Payer: Medicare Other | Admitting: Pharmacist Clinician (PhC)/ Clinical Pharmacy Specialist

## 2016-10-16 DIAGNOSIS — I480 Paroxysmal atrial fibrillation: Secondary | ICD-10-CM | POA: Diagnosis not present

## 2016-10-16 LAB — POCT INR: INR: 2.7

## 2016-10-17 ENCOUNTER — Encounter: Payer: Self-pay | Admitting: Cardiology

## 2016-11-06 ENCOUNTER — Ambulatory Visit (INDEPENDENT_AMBULATORY_CARE_PROVIDER_SITE_OTHER): Payer: Medicare Other | Admitting: Pharmacist Clinician (PhC)/ Clinical Pharmacy Specialist

## 2016-11-06 DIAGNOSIS — I480 Paroxysmal atrial fibrillation: Secondary | ICD-10-CM

## 2016-11-06 LAB — CUP PACEART REMOTE DEVICE CHECK
Battery Voltage: 2.79 V
Brady Statistic AP VS Percent: 97 %
Date Time Interrogation Session: 20171206194349
Implantable Lead Implant Date: 20070417
Implantable Lead Location: 753859
Lead Channel Pacing Threshold Pulse Width: 0.4 ms
Lead Channel Pacing Threshold Pulse Width: 0.4 ms
Lead Channel Setting Pacing Amplitude: 1.5 V
Lead Channel Setting Pacing Pulse Width: 0.4 ms
MDC IDC LEAD IMPLANT DT: 20070417
MDC IDC LEAD LOCATION: 753860
MDC IDC MSMT BATTERY IMPEDANCE: 137 Ohm
MDC IDC MSMT BATTERY REMAINING LONGEVITY: 148 mo
MDC IDC MSMT LEADCHNL RA IMPEDANCE VALUE: 508 Ohm
MDC IDC MSMT LEADCHNL RA PACING THRESHOLD AMPLITUDE: 0.5 V
MDC IDC MSMT LEADCHNL RV IMPEDANCE VALUE: 656 Ohm
MDC IDC MSMT LEADCHNL RV PACING THRESHOLD AMPLITUDE: 1 V
MDC IDC PG IMPLANT DT: 20150922
MDC IDC SET LEADCHNL RV PACING AMPLITUDE: 2 V
MDC IDC SET LEADCHNL RV SENSING SENSITIVITY: 5.6 mV
MDC IDC STAT BRADY AP VP PERCENT: 0 %
MDC IDC STAT BRADY AS VP PERCENT: 1 %
MDC IDC STAT BRADY AS VS PERCENT: 2 %

## 2016-11-06 LAB — POCT INR: INR: 2

## 2016-12-12 ENCOUNTER — Ambulatory Visit (INDEPENDENT_AMBULATORY_CARE_PROVIDER_SITE_OTHER): Payer: Medicare Other | Admitting: Pharmacist

## 2016-12-12 ENCOUNTER — Ambulatory Visit (INDEPENDENT_AMBULATORY_CARE_PROVIDER_SITE_OTHER): Payer: Medicare Other | Admitting: Cardiovascular Disease

## 2016-12-12 ENCOUNTER — Encounter: Payer: Self-pay | Admitting: Cardiovascular Disease

## 2016-12-12 VITALS — BP 140/83 | HR 82 | Ht 66.0 in | Wt 194.0 lb

## 2016-12-12 DIAGNOSIS — I480 Paroxysmal atrial fibrillation: Secondary | ICD-10-CM

## 2016-12-12 DIAGNOSIS — I495 Sick sinus syndrome: Secondary | ICD-10-CM | POA: Diagnosis not present

## 2016-12-12 DIAGNOSIS — I10 Essential (primary) hypertension: Secondary | ICD-10-CM | POA: Diagnosis not present

## 2016-12-12 DIAGNOSIS — R9431 Abnormal electrocardiogram [ECG] [EKG]: Secondary | ICD-10-CM | POA: Diagnosis not present

## 2016-12-12 DIAGNOSIS — Z7901 Long term (current) use of anticoagulants: Secondary | ICD-10-CM

## 2016-12-12 DIAGNOSIS — R001 Bradycardia, unspecified: Secondary | ICD-10-CM

## 2016-12-12 DIAGNOSIS — I5032 Chronic diastolic (congestive) heart failure: Secondary | ICD-10-CM

## 2016-12-12 LAB — CUP PACEART INCLINIC DEVICE CHECK
Brady Statistic AP VS Percent: 96.5 %
Brady Statistic AS VS Percent: 2.5 %
Date Time Interrogation Session: 20180207122552
Implantable Lead Implant Date: 20070417
Implantable Lead Location: 753859
Lead Channel Impedance Value: 661 Ohm
Lead Channel Pacing Threshold Amplitude: 1 V
Lead Channel Pacing Threshold Pulse Width: 0.4 ms
Lead Channel Pacing Threshold Pulse Width: 0.4 ms
Lead Channel Sensing Intrinsic Amplitude: 22.4 mV
Lead Channel Setting Pacing Amplitude: 1.5 V
Lead Channel Setting Sensing Sensitivity: 5.6 mV
MDC IDC LEAD IMPLANT DT: 20070417
MDC IDC LEAD LOCATION: 753860
MDC IDC MSMT BATTERY IMPEDANCE: 113 Ohm
MDC IDC MSMT BATTERY REMAINING LONGEVITY: 156 mo
MDC IDC MSMT BATTERY VOLTAGE: 2.79 V
MDC IDC MSMT LEADCHNL RA IMPEDANCE VALUE: 466 Ohm
MDC IDC MSMT LEADCHNL RA PACING THRESHOLD AMPLITUDE: 0.75 V
MDC IDC MSMT LEADCHNL RA SENSING INTR AMPL: 5.6 mV
MDC IDC PG IMPLANT DT: 20150922
MDC IDC SET LEADCHNL RV PACING AMPLITUDE: 2 V
MDC IDC SET LEADCHNL RV PACING PULSEWIDTH: 0.4 ms
MDC IDC STAT BRADY AP VP PERCENT: 0.3 %
MDC IDC STAT BRADY AS VP PERCENT: 0.6 %

## 2016-12-12 LAB — PACEMAKER DEVICE OBSERVATION

## 2016-12-12 LAB — POCT INR: INR: 2.9

## 2016-12-12 NOTE — Patient Instructions (Signed)
Dr Sallyanne Kuster recommends that you continue on your current medications as directed. Please refer to the Current Medication list given to you today.  Remote monitoring is used to monitor your Pacemaker of ICD from home. This monitoring reduces the number of office visits required to check your device to one time per year. It allows [place] to keep an eye on the functioning of your device to ensure it is working properly. You are scheduled for a device check from home on Wednesday, May 9th, 2018. You may send your transmission at any time that day. If you have a wireless device, the transmission will be sent automatically. After your physician reviews your transmission, you will receive a postcard with your next transmission date.  Dr Sallyanne Kuster recommends that you schedule a follow-up appointment in 12 months with a pacemaker check. You will receive a reminder letter in the mail two months in advance. If you don't receive a letter, please call our office to schedule the follow-up appointment.  If you need a refill on your cardiac medications before your next appointment, please call your pharmacy.

## 2016-12-12 NOTE — Progress Notes (Signed)
Patient ID: [name], male   DOB: 1934/09/08, 83 y.o.   MRN: [id number]    Cardiology Office Note    Date:  12/12/2016   ID:  [name], DOB 01-02-1934, MRN [id number]  PCP:  Purvis Kilts, MD  Cardiologist:   Sanda Klein, MD   Chief Complaint  Patient presents with  . Follow-up    History of Present Illness:  [name] is a 83 y.o. male with recurrent persistent paroxysmal atrial fibrillation and tachycardia-bradycardia syndrome who returns for pacemaker check. He is here with his wife [name] who has remarkably similar medical problems  He denies exertional angina or dyspnea. Is able to take care of household chores and carrying wood for their stove with which they heat the home in the winter. He does not have edema. He has not had any serious bleeding problems. He continues to take warfarin and has steady prothrombin times. Remarkably, he is maintaining sinus rhythm in the long run, despite being off all antiarrhythmics (he had severe flecainide toxicity with congestive heart failure).  Pacemaker interrogation shows normal device function. He has 97% atrial pacing but only 0.9% ventricular pacing. Generator longevity is estimated that another 13 years. He has had an overall burden of atrial fibrillation of 4.2%, with episodes scattered randomly and unpredictably over the last 6 months. He has excellent ventricular rate control (60-70s) during atrial fibrillation His pacemaker has an anticipated generator longevity of another 13 years (Medtronic Adapta dual-chamber implanted 2015).  The pressure is borderline high today but at home his systolic blood pressure is never over 135  He has a history of recurrent persistent atrial fibrillation, hypertension, GERD, diabetes, dual chamber Medtronic pacemaker for SSS. On 07/27/2014 the patient underwent generator change out.EF has usually been 50-55%, but dropped to 40-45% when he had broad QRS/flecainide toxicity. He did not  have ischemia by his most recent nuclear stress test, EF normalized off flecainide. In 2007, cardiac catheterization showed a 40% proximal LAD stenosis, no other significant lesions. He is on chronic warfarin anticoagulation and has not had bleeding complications or stroke. In the past has had successful cardioversion as well as pace-termination of atrial flutter via his pacemaker.  Past Medical History:  Diagnosis Date  . Arthritis   . Atrial fibrillation ([place])   . Barrett's esophagus   . CAD (coronary artery disease)    echo 09-05-2010-EF nl, mod-severe dilated Left atrium; myoview 02-08-12-EF 52%, no ischemia  . Cancer [facility])    prostate - radiation only  . Diabetes mellitus without complication ([place])    "prediabetic"-no oral meds  . GERD (gastroesophageal reflux disease)   . Gout   . H/O transesophageal echocardiography (TEE) for monitoring 09/05/10   tee guided AT pace termination, did not proceed with cardioversion due to termination of the atrial flutter through his pacemaker  . History of cardioversion 02/09/13   electrical synchronized cardioversion, on flecainide and warfarin  . History of kidney stones    per pt - no actual diagnosisi  . History of stomach ulcers    many yrs ago  . HTN (hypertension)   . Pacemaker    medtronic  . Pacemaker 09-25-13  . Prostate cancer ([place]) 09-25-13   tx. with radiation only- dx. 2013    Past Surgical History:  Procedure Laterality Date  . APPENDECTOMY    . CARDIAC CATHETERIZATION  02/18/06   EF 65%, focal napkin ring-like lesion into the prox LAD otherwise normal coronaries  . CARDIOVERSION N/A 02/09/2013  Procedure: CARDIOVERSION;  Surgeon: Sanda Klein, MD;  Location: [facility] ENDOSCOPY;  Service: Cardiovascular;  Laterality: N/A;  . PACEMAKER GENERATOR CHANGE N/A 07/27/2014   Procedure: PACEMAKER GENERATOR CHANGE;  Surgeon: Sanda Klein, MD;  Location: [facility] CATH LAB;  Service: Cardiovascular;  Laterality: N/A;  . PACEMAKER INSERTION      medtronic adapta- Dr. Sallyanne Kuster follows- LOV 9'14  . TONSILLECTOMY    . TOTAL KNEE ARTHROPLASTY Right 10/05/2013   Procedure: RIGHT TOTAL KNEE ARTHROPLASTY;  Surgeon: Gearlean Alf, MD;  Location: [facility];  Service: Orthopedics;  Laterality: Right;    Outpatient Medications Prior to Visit  Medication Sig Dispense Refill  . acetaminophen (TYLENOL) 650 MG CR tablet Take 650 mg by mouth every 8 (eight) hours as needed for pain.     . calcium carbonate (OS-CAL) 600 MG TABS tablet Take 600 mg by mouth every other day.     . furosemide (LASIX) 20 MG tablet Take 20 mg by mouth as needed for fluid or edema.     . metoprolol succinate (TOPROL-XL) 50 MG 24 hr tablet Take 1 tablet (50 mg total) by mouth daily. 90 tablet 3  . Misc Natural Products (TART CHERRY ADVANCED PO) Take 1,200 mg by mouth daily.     . Omega-3 Fatty Acids (OMEGA 3 PO) Take 90 mg by mouth daily.     . pantoprazole (PROTONIX) 40 MG tablet Take 1 tablet (40 mg total) by mouth daily. 90 tablet 3  . Potassium 99 MG TABS Take 1 tablet by mouth every other day.     . warfarin (COUMADIN) 2.5 MG tablet TAKE 1 TABLET BY MOUTH DAILY OR AS DIRECTED BY COUMADIN CLINIC 90 tablet 1   No facility-administered medications prior to visit.      Allergies:   Shellfish-derived products; No known allergies; and Tape   Social History   Social History  . Marital status: Married    Spouse name: N/A  . Number of children: N/A  . Years of education: N/A   Social History Main Topics  . Smoking status: Former Smoker    Quit date: 07/22/1970  . Smokeless tobacco: Never Used     Comment: Quit in the 32s  . Alcohol use No  . Drug use: No  . Sexual activity: Not Currently   Other Topics Concern  . None   Social History Narrative  . None     Family History:  The patient's family history includes Diabetes in his brother and mother.   ROS:   Please see the history of present illness.    ROS All other systems reviewed and are  negative.   PHYSICAL EXAM:   VS:  BP 140/83   Pulse 82   Ht 5\' 6"  (1.676 m)   Wt 88 kg (194 lb)   BMI 31.31 kg/m    GEN: Well nourished, well developed, in no acute distress  HEENT: normal  Neck: no JVD, carotid bruits, or masses Cardiac: RRR; no murmurs, rubs, or gallops,no edema , healthy subclavian pacemaker site Respiratory:  clear to auscultation bilaterally, normal work of breathing GI: soft, nontender, nondistended, + BS MS: no deformity or atrophy  Skin: warm and dry, no rash Neuro:  Alert and Oriented x 3, Strength and sensation are intact Psych: euthymic mood, full affect  Wt Readings from Last 3 Encounters:  12/12/16 88 kg (194 lb)  12/06/15 89.9 kg (198 lb 2 oz)  07/30/15 86.2 kg (190 lb)      Studies/Labs Reviewed:  EKG:  EKG is ordered today.  The ekg ordered today demonstrates  atrial paced, ventricular sensed. Long AV delay280 ms. Voltage criteria for left ventricular hypertrophy and nonspecific T-wave changes are longstanding. T-wave inversion V3-V6 QTC 460 ms  Recent Labs: No results found for requested labs within last 8760 hours.    ASSESSMENT:    1. SSS (sick sinus syndrome) (HCC)   2. Paroxysmal atrial fibrillation ([place])   3. Long QT interval   4. Essential hypertension   5. Long term current use of anticoagulant therapy   6. Chronic diastolic heart failure (HCC)      PLAN:  In order of problems listed above:  1. SSS: Heart rate histogram distributions shows appropriate treatment of sinus bradycardia/chronotropic incompetence. He does not have symptoms of bradycardia. 2. AFib: Only 4% burden of atrial fibrillation since last device check, despite no antiarrhythmic therapy. Rate control is adequate. Antiarrhythmics are not justified. CHADSVasc score is high, at least 4 (age 21, CHF, HTN), maybe 6 if one counts CAD and borderline diabetes 3. Long QT: Only borderline prolonged today, avoid QT prolonging agents 4. HTN: Fair control   5. Well-tolerated anticoagulation with warfarin. 6. CHF: Clinically well compensated, euvolemic on a very low dose of loop diuretic    Medication Adjustments/Labs and Tests Ordered: Current medicines are reviewed at length with the patient today.  Concerns regarding medicines are outlined above.  Medication changes, Labs and Tests ordered today are listed in the Patient Instructions below. Patient Instructions  Dr Sallyanne Kuster recommends that you continue on your current medications as directed. Please refer to the Current Medication list given to you today.  Remote monitoring is used to monitor your Pacemaker of ICD from home. This monitoring reduces the number of office visits required to check your device to one time per year. It allows [place] to keep an eye on the functioning of your device to ensure it is working properly. You are scheduled for a device check from home on Wednesday, May 9th, 2018. You may send your transmission at any time that day. If you have a wireless device, the transmission will be sent automatically. After your physician reviews your transmission, you will receive a postcard with your next transmission date.  Dr Sallyanne Kuster recommends that you schedule a follow-up appointment in 12 months with a pacemaker check. You will receive a reminder letter in the mail two months in advance. If you don't receive a letter, please call our office to schedule the follow-up appointment.  If you need a refill on your cardiac medications before your next appointment, please call your pharmacy.      Signed, Sanda Klein, MD  12/12/2016 2:28 PM    [facility] [address], [place], [place]  [address] Phone: [phone]; Fax: [phone]

## 2016-12-13 ENCOUNTER — Other Ambulatory Visit: Payer: Self-pay | Admitting: Cardiovascular Disease

## 2017-01-24 ENCOUNTER — Ambulatory Visit (INDEPENDENT_AMBULATORY_CARE_PROVIDER_SITE_OTHER): Payer: Medicare Other | Admitting: Pharmacist Clinician (PhC)/ Clinical Pharmacy Specialist

## 2017-01-24 DIAGNOSIS — I480 Paroxysmal atrial fibrillation: Secondary | ICD-10-CM | POA: Diagnosis not present

## 2017-01-24 DIAGNOSIS — Z7901 Long term (current) use of anticoagulants: Secondary | ICD-10-CM | POA: Diagnosis not present

## 2017-01-24 LAB — POCT INR: INR: 2.1

## 2017-02-22 ENCOUNTER — Ambulatory Visit (INDEPENDENT_AMBULATORY_CARE_PROVIDER_SITE_OTHER): Payer: Medicare Other | Admitting: Pharmacist Clinician (PhC)/ Clinical Pharmacy Specialist

## 2017-02-22 DIAGNOSIS — I480 Paroxysmal atrial fibrillation: Secondary | ICD-10-CM | POA: Diagnosis not present

## 2017-02-22 DIAGNOSIS — Z7901 Long term (current) use of anticoagulants: Secondary | ICD-10-CM | POA: Diagnosis not present

## 2017-02-22 LAB — POCT INR: INR: 2.8

## 2017-02-22 MED ORDER — FUROSEMIDE 20 MG PO TABS: 20.0000 mg | ORAL_TABLET | ORAL | 3 refills | Status: DC | PRN

## 2017-03-13 ENCOUNTER — Ambulatory Visit (INDEPENDENT_AMBULATORY_CARE_PROVIDER_SITE_OTHER): Payer: Medicare Other | Admitting: *Deleted

## 2017-03-13 DIAGNOSIS — I495 Sick sinus syndrome: Secondary | ICD-10-CM | POA: Diagnosis not present

## 2017-03-13 NOTE — Progress Notes (Signed)
Remote pacemaker transmission.   

## 2017-03-14 LAB — CUP PACEART REMOTE DEVICE CHECK
Battery Impedance: 138 Ohm
Battery Remaining Longevity: 145 mo
Battery Voltage: 2.79 V
Date Time Interrogation Session: 20180509130120
Implantable Lead Implant Date: 20070417
Implantable Lead Location: 753860
Implantable Lead Model: 5092
Implantable Lead Model: 5594
Implantable Pulse Generator Implant Date: 20150922
Lead Channel Impedance Value: 494 Ohm
Lead Channel Impedance Value: 629 Ohm
Lead Channel Pacing Threshold Amplitude: 0.5 V
Lead Channel Pacing Threshold Pulse Width: 0.4 ms
Lead Channel Setting Pacing Amplitude: 1.5 V
Lead Channel Setting Pacing Amplitude: 2.25 V
Lead Channel Setting Pacing Pulse Width: 0.4 ms
Lead Channel Setting Sensing Sensitivity: 5.6 mV
MDC IDC LEAD IMPLANT DT: 20070417
MDC IDC LEAD LOCATION: 753859
MDC IDC MSMT LEADCHNL RA PACING THRESHOLD PULSEWIDTH: 0.4 ms
MDC IDC MSMT LEADCHNL RV PACING THRESHOLD AMPLITUDE: 1.125 V
MDC IDC MSMT LEADCHNL RV SENSING INTR AMPL: 16 mV
MDC IDC STAT BRADY AP VP PERCENT: 1 %
MDC IDC STAT BRADY AP VS PERCENT: 84 %
MDC IDC STAT BRADY AS VP PERCENT: 3 %
MDC IDC STAT BRADY AS VS PERCENT: 12 %

## 2017-03-15 ENCOUNTER — Encounter: Payer: Self-pay | Admitting: Cardiology

## 2017-03-25 ENCOUNTER — Ambulatory Visit (INDEPENDENT_AMBULATORY_CARE_PROVIDER_SITE_OTHER): Payer: Medicare Other | Admitting: Pharmacist

## 2017-03-25 DIAGNOSIS — Z7901 Long term (current) use of anticoagulants: Secondary | ICD-10-CM | POA: Diagnosis not present

## 2017-03-25 DIAGNOSIS — I480 Paroxysmal atrial fibrillation: Secondary | ICD-10-CM | POA: Diagnosis not present

## 2017-03-25 LAB — POCT INR: INR: 2.9

## 2017-04-18 ENCOUNTER — Ambulatory Visit (INDEPENDENT_AMBULATORY_CARE_PROVIDER_SITE_OTHER): Payer: Medicare Other | Admitting: Pharmacist

## 2017-04-18 ENCOUNTER — Other Ambulatory Visit: Payer: Self-pay | Admitting: Cardiovascular Disease

## 2017-04-18 DIAGNOSIS — Z7901 Long term (current) use of anticoagulants: Secondary | ICD-10-CM | POA: Diagnosis not present

## 2017-04-18 DIAGNOSIS — I480 Paroxysmal atrial fibrillation: Secondary | ICD-10-CM | POA: Diagnosis not present

## 2017-04-18 LAB — POCT INR: INR: 3

## 2017-05-16 ENCOUNTER — Ambulatory Visit (INDEPENDENT_AMBULATORY_CARE_PROVIDER_SITE_OTHER): Payer: Medicare Other | Admitting: Pharmacist

## 2017-05-16 DIAGNOSIS — I480 Paroxysmal atrial fibrillation: Secondary | ICD-10-CM

## 2017-05-16 DIAGNOSIS — Z7901 Long term (current) use of anticoagulants: Secondary | ICD-10-CM

## 2017-05-16 LAB — POCT INR: INR: 3.1

## 2017-06-03 ENCOUNTER — Other Ambulatory Visit: Payer: Self-pay

## 2017-06-12 ENCOUNTER — Ambulatory Visit: Payer: Medicare Other | Admitting: *Deleted

## 2017-06-13 ENCOUNTER — Encounter: Payer: Self-pay | Admitting: Cardiology

## 2017-06-13 ENCOUNTER — Ambulatory Visit (INDEPENDENT_AMBULATORY_CARE_PROVIDER_SITE_OTHER): Payer: Medicare Other | Admitting: Pharmacist Clinician (PhC)/ Clinical Pharmacy Specialist

## 2017-06-13 DIAGNOSIS — Z7901 Long term (current) use of anticoagulants: Secondary | ICD-10-CM | POA: Diagnosis not present

## 2017-06-13 DIAGNOSIS — I480 Paroxysmal atrial fibrillation: Secondary | ICD-10-CM | POA: Diagnosis not present

## 2017-06-13 LAB — POCT INR: INR: 3

## 2017-06-13 NOTE — Progress Notes (Signed)
Opened in error

## 2017-06-20 ENCOUNTER — Telehealth: Payer: Self-pay | Admitting: Cardiovascular Disease

## 2017-06-20 NOTE — Telephone Encounter (Signed)
New message     Pt states he got a letter saying that you did not receive transmission , when he tried to resend it it says on machine that it is malfunctioning

## 2017-06-21 ENCOUNTER — Ambulatory Visit (INDEPENDENT_AMBULATORY_CARE_PROVIDER_SITE_OTHER): Payer: Medicare Other | Admitting: *Deleted

## 2017-06-21 DIAGNOSIS — I480 Paroxysmal atrial fibrillation: Secondary | ICD-10-CM

## 2017-06-21 DIAGNOSIS — I495 Sick sinus syndrome: Secondary | ICD-10-CM

## 2017-06-21 NOTE — Telephone Encounter (Signed)
Spoke with patient.  Assisted him with sending transmission, successfully received.  Patient appreciative and denies additional questions or concerns at this time.

## 2017-06-25 NOTE — Progress Notes (Signed)
Remote pacemaker check. 

## 2017-06-26 LAB — CUP PACEART REMOTE DEVICE CHECK
Battery Remaining Longevity: 136 mo
Brady Statistic AP VP Percent: 1 %
Implantable Lead Implant Date: 20070417
Implantable Lead Location: 753860
Implantable Lead Model: 5092
Implantable Lead Model: 5594
Implantable Pulse Generator Implant Date: 20150922
Lead Channel Impedance Value: 423 Ohm
Lead Channel Impedance Value: 654 Ohm
Lead Channel Pacing Threshold Amplitude: 0.5 V
Lead Channel Pacing Threshold Amplitude: 1 V
Lead Channel Setting Pacing Pulse Width: 0.4 ms
MDC IDC LEAD IMPLANT DT: 20070417
MDC IDC LEAD LOCATION: 753859
MDC IDC MSMT BATTERY IMPEDANCE: 162 Ohm
MDC IDC MSMT BATTERY VOLTAGE: 2.79 V
MDC IDC MSMT LEADCHNL RA PACING THRESHOLD PULSEWIDTH: 0.4 ms
MDC IDC MSMT LEADCHNL RV PACING THRESHOLD PULSEWIDTH: 0.4 ms
MDC IDC SESS DTM: 20180817125207
MDC IDC SET LEADCHNL RA PACING AMPLITUDE: 1.5 V
MDC IDC SET LEADCHNL RV PACING AMPLITUDE: 2 V
MDC IDC SET LEADCHNL RV SENSING SENSITIVITY: 5.6 mV
MDC IDC STAT BRADY AP VS PERCENT: 82 %
MDC IDC STAT BRADY AS VP PERCENT: 2 %
MDC IDC STAT BRADY AS VS PERCENT: 15 %

## 2017-07-02 ENCOUNTER — Encounter: Payer: Self-pay | Admitting: Cardiology

## 2017-07-03 ENCOUNTER — Ambulatory Visit (INDEPENDENT_AMBULATORY_CARE_PROVIDER_SITE_OTHER): Payer: Medicare Other | Admitting: Pharmacist Clinician (PhC)/ Clinical Pharmacy Specialist

## 2017-07-03 DIAGNOSIS — I480 Paroxysmal atrial fibrillation: Secondary | ICD-10-CM

## 2017-07-03 DIAGNOSIS — Z7901 Long term (current) use of anticoagulants: Secondary | ICD-10-CM | POA: Diagnosis not present

## 2017-07-03 LAB — POCT INR: INR: 3.1

## 2017-07-29 ENCOUNTER — Ambulatory Visit (INDEPENDENT_AMBULATORY_CARE_PROVIDER_SITE_OTHER): Payer: Medicare Other | Admitting: Pharmacist Clinician (PhC)/ Clinical Pharmacy Specialist

## 2017-07-29 DIAGNOSIS — I480 Paroxysmal atrial fibrillation: Secondary | ICD-10-CM

## 2017-07-29 DIAGNOSIS — Z7901 Long term (current) use of anticoagulants: Secondary | ICD-10-CM | POA: Diagnosis not present

## 2017-07-29 LAB — POCT INR: INR: 2.6

## 2017-08-12 ENCOUNTER — Telehealth: Payer: Self-pay | Admitting: Cardiovascular Disease

## 2017-08-12 NOTE — Telephone Encounter (Signed)
Mr. [name] reports that he is hearing buzzing from his pacemaker. I informed him that his pacemaker will not vibrate or make noise. I advised him to send a transmission with his home monitor to check ppm function- he is agreeable.

## 2017-08-12 NOTE — Telephone Encounter (Signed)
Pt unable to send transmission. He is insistent he hears humming from his pacemaker. Added to Device Clinic schedule at 10am tomorrow 08/13/17.

## 2017-08-12 NOTE — Telephone Encounter (Signed)
New message   Patient states he feels like device is vibrating or humming and he can hear it. No pain, no distress, but vibration seems more noticeable. Please call   1. Has your device fired? NO  2. Is you device beeping? NO  3. Are you experiencing draining or swelling at device site? NO  4. Are you calling to see if we received your device transmission? *NO  5. Have you passed out? NO    Please route to [place]

## 2017-08-13 ENCOUNTER — Ambulatory Visit (INDEPENDENT_AMBULATORY_CARE_PROVIDER_SITE_OTHER): Payer: Medicare Other | Admitting: *Deleted

## 2017-08-13 DIAGNOSIS — I495 Sick sinus syndrome: Secondary | ICD-10-CM | POA: Diagnosis not present

## 2017-08-13 LAB — CUP PACEART INCLINIC DEVICE CHECK
Battery Remaining Longevity: 138 mo
Battery Voltage: 2.78 V
Brady Statistic AS VS Percent: 11 %
Date Time Interrogation Session: 20181009103753
Implantable Lead Implant Date: 20070417
Implantable Lead Location: 753859
Implantable Lead Location: 753860
Implantable Lead Model: 5594
Implantable Pulse Generator Implant Date: 20150922
Lead Channel Pacing Threshold Amplitude: 0.5 V
Lead Channel Pacing Threshold Amplitude: 0.5 V
Lead Channel Pacing Threshold Amplitude: 1 V
Lead Channel Pacing Threshold Pulse Width: 0.4 ms
Lead Channel Pacing Threshold Pulse Width: 0.4 ms
Lead Channel Pacing Threshold Pulse Width: 0.4 ms
Lead Channel Setting Pacing Amplitude: 2 V
MDC IDC LEAD IMPLANT DT: 20070417
MDC IDC MSMT BATTERY IMPEDANCE: 161 Ohm
MDC IDC MSMT LEADCHNL RA IMPEDANCE VALUE: 494 Ohm
MDC IDC MSMT LEADCHNL RA PACING THRESHOLD PULSEWIDTH: 0.4 ms
MDC IDC MSMT LEADCHNL RV IMPEDANCE VALUE: 642 Ohm
MDC IDC MSMT LEADCHNL RV PACING THRESHOLD AMPLITUDE: 1 V
MDC IDC MSMT LEADCHNL RV SENSING INTR AMPL: 22.4 mV
MDC IDC SET LEADCHNL RA PACING AMPLITUDE: 1.5 V
MDC IDC SET LEADCHNL RV PACING PULSEWIDTH: 0.4 ms
MDC IDC SET LEADCHNL RV SENSING SENSITIVITY: 5.6 mV
MDC IDC STAT BRADY AP VP PERCENT: 1 %
MDC IDC STAT BRADY AP VS PERCENT: 87 %
MDC IDC STAT BRADY AS VP PERCENT: 1 %

## 2017-08-13 NOTE — Progress Notes (Signed)
Pacemaker check in clinic for ?humming sound coming from his ppm. Normal device function. Thresholds, sensing, impedances consistent with previous measurements. Device programmed to maximize longevity. 39.1% AT/AF burden, max dur. >96hrs (6-16), last 10/6 + warfarin. No high ventricular rates noted. Device programmed at appropriate safety margins. Histogram distribution appropriate for patient activity level. Device programmed to optimize intrinsic conduction. Estimated longevity 11.5 years. Patient will follow up via [facility] on 11/19 as scheduled. Patient education completed. Assured patient that his device is not capable of humming. I encouraged him to contact his PCP if he hears the humming again. Patient verbalized understanding.

## 2017-08-26 ENCOUNTER — Ambulatory Visit (INDEPENDENT_AMBULATORY_CARE_PROVIDER_SITE_OTHER): Payer: Medicare Other | Admitting: Pharmacist

## 2017-08-26 DIAGNOSIS — Z7901 Long term (current) use of anticoagulants: Secondary | ICD-10-CM | POA: Diagnosis not present

## 2017-08-26 DIAGNOSIS — I480 Paroxysmal atrial fibrillation: Secondary | ICD-10-CM | POA: Diagnosis not present

## 2017-08-26 LAB — POCT INR: INR: 4

## 2017-09-16 ENCOUNTER — Ambulatory Visit (INDEPENDENT_AMBULATORY_CARE_PROVIDER_SITE_OTHER): Payer: Medicare Other | Admitting: Pharmacist Clinician (PhC)/ Clinical Pharmacy Specialist

## 2017-09-16 DIAGNOSIS — I480 Paroxysmal atrial fibrillation: Secondary | ICD-10-CM

## 2017-09-16 DIAGNOSIS — Z7901 Long term (current) use of anticoagulants: Secondary | ICD-10-CM

## 2017-09-16 LAB — POCT INR: INR: 3.4

## 2017-09-23 ENCOUNTER — Ambulatory Visit (INDEPENDENT_AMBULATORY_CARE_PROVIDER_SITE_OTHER): Payer: Medicare Other | Admitting: *Deleted

## 2017-09-23 ENCOUNTER — Telehealth: Payer: Self-pay | Admitting: Cardiology

## 2017-09-23 DIAGNOSIS — I495 Sick sinus syndrome: Secondary | ICD-10-CM

## 2017-09-23 NOTE — Telephone Encounter (Signed)
LMOVM reminding pt to send remote transmission.   

## 2017-09-24 LAB — CUP PACEART REMOTE DEVICE CHECK
Battery Impedance: 186 Ohm
Brady Statistic AS VS Percent: 2 %
Implantable Lead Implant Date: 20070417
Implantable Lead Location: 753859
Implantable Lead Location: 753860
Implantable Lead Model: 5594
Lead Channel Impedance Value: 634 Ohm
Lead Channel Pacing Threshold Pulse Width: 0.4 ms
Lead Channel Setting Pacing Amplitude: 2.5 V
Lead Channel Setting Sensing Sensitivity: 5.6 mV
MDC IDC LEAD IMPLANT DT: 20070417
MDC IDC MSMT BATTERY REMAINING LONGEVITY: 126 mo
MDC IDC MSMT BATTERY VOLTAGE: 2.78 V
MDC IDC MSMT LEADCHNL RA IMPEDANCE VALUE: 502 Ohm
MDC IDC MSMT LEADCHNL RA PACING THRESHOLD AMPLITUDE: 0.5 V
MDC IDC MSMT LEADCHNL RA PACING THRESHOLD PULSEWIDTH: 0.4 ms
MDC IDC MSMT LEADCHNL RV PACING THRESHOLD AMPLITUDE: 1.25 V
MDC IDC PG IMPLANT DT: 20150922
MDC IDC SESS DTM: 20181119211202
MDC IDC SET LEADCHNL RA PACING AMPLITUDE: 1.5 V
MDC IDC SET LEADCHNL RV PACING PULSEWIDTH: 0.4 ms
MDC IDC STAT BRADY AP VP PERCENT: 0 %
MDC IDC STAT BRADY AP VS PERCENT: 97 %
MDC IDC STAT BRADY AS VP PERCENT: 0 %

## 2017-09-24 NOTE — Progress Notes (Signed)
Remote pacemaker transmission.   

## 2017-09-30 ENCOUNTER — Ambulatory Visit (INDEPENDENT_AMBULATORY_CARE_PROVIDER_SITE_OTHER): Payer: Medicare Other | Admitting: Pharmacist Clinician (PhC)/ Clinical Pharmacy Specialist

## 2017-09-30 DIAGNOSIS — Z7901 Long term (current) use of anticoagulants: Secondary | ICD-10-CM | POA: Diagnosis not present

## 2017-09-30 DIAGNOSIS — I480 Paroxysmal atrial fibrillation: Secondary | ICD-10-CM

## 2017-09-30 LAB — POCT INR: INR: 2.7

## 2017-10-03 ENCOUNTER — Encounter: Payer: Self-pay | Admitting: Cardiology

## 2017-10-06 ENCOUNTER — Emergency Department (HOSPITAL_COMMUNITY)
Admission: EM | Admit: 2017-10-06 | Discharge: 2017-10-06 | Disposition: A | Discharge status: Home / Self Care | Payer: 59 | Attending: Emergency Medicine | Admitting: Emergency Medicine

## 2017-10-06 ENCOUNTER — Emergency Department (HOSPITAL_COMMUNITY): Payer: 59

## 2017-10-06 ENCOUNTER — Encounter (HOSPITAL_COMMUNITY): Payer: Self-pay | Admitting: *Deleted

## 2017-10-06 ENCOUNTER — Other Ambulatory Visit: Payer: Self-pay

## 2017-10-06 DIAGNOSIS — Z96651 Presence of right artificial knee joint: Secondary | ICD-10-CM | POA: Diagnosis not present

## 2017-10-06 DIAGNOSIS — I2510 Atherosclerotic heart disease of native coronary artery without angina pectoris: Secondary | ICD-10-CM | POA: Insufficient documentation

## 2017-10-06 DIAGNOSIS — Z950 Presence of cardiac pacemaker: Secondary | ICD-10-CM | POA: Insufficient documentation

## 2017-10-06 DIAGNOSIS — E119 Type 2 diabetes mellitus without complications: Secondary | ICD-10-CM | POA: Insufficient documentation

## 2017-10-06 DIAGNOSIS — Y9389 Activity, other specified: Secondary | ICD-10-CM | POA: Diagnosis not present

## 2017-10-06 DIAGNOSIS — Z7901 Long term (current) use of anticoagulants: Secondary | ICD-10-CM | POA: Diagnosis not present

## 2017-10-06 DIAGNOSIS — Y92410 Unspecified street and highway as the place of occurrence of the external cause: Secondary | ICD-10-CM | POA: Insufficient documentation

## 2017-10-06 DIAGNOSIS — M25512 Pain in left shoulder: Secondary | ICD-10-CM | POA: Diagnosis not present

## 2017-10-06 DIAGNOSIS — I10 Essential (primary) hypertension: Secondary | ICD-10-CM | POA: Insufficient documentation

## 2017-10-06 DIAGNOSIS — Z8546 Personal history of malignant neoplasm of prostate: Secondary | ICD-10-CM | POA: Insufficient documentation

## 2017-10-06 DIAGNOSIS — S4992XA Unspecified injury of left shoulder and upper arm, initial encounter: Secondary | ICD-10-CM | POA: Diagnosis not present

## 2017-10-06 DIAGNOSIS — Z87891 Personal history of nicotine dependence: Secondary | ICD-10-CM | POA: Insufficient documentation

## 2017-10-06 DIAGNOSIS — Y999 Unspecified external cause status: Secondary | ICD-10-CM | POA: Insufficient documentation

## 2017-10-06 DIAGNOSIS — S43402A Unspecified sprain of left shoulder joint, initial encounter: Secondary | ICD-10-CM | POA: Diagnosis not present

## 2017-10-06 DIAGNOSIS — Z79899 Other long term (current) drug therapy: Secondary | ICD-10-CM | POA: Diagnosis not present

## 2017-10-06 NOTE — Discharge Instructions (Signed)
Try icing your shoulder for the next 48 hours, take over the counter medications as needed, follow up with your primary care or orthopedic doctor if the symptoms are still persisting in a week or so

## 2017-10-06 NOTE — ED Notes (Signed)
Patient back from x-ray 
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
## 2017-10-18 ENCOUNTER — Ambulatory Visit (INDEPENDENT_AMBULATORY_CARE_PROVIDER_SITE_OTHER): Payer: Medicare Other | Admitting: Pharmacist

## 2017-10-18 DIAGNOSIS — Z7901 Long term (current) use of anticoagulants: Secondary | ICD-10-CM | POA: Diagnosis not present

## 2017-10-18 DIAGNOSIS — I480 Paroxysmal atrial fibrillation: Secondary | ICD-10-CM | POA: Diagnosis not present

## 2017-10-18 LAB — POCT INR: INR: 2.8

## 2017-10-18 NOTE — Patient Instructions (Signed)
Description   Continue with 1 tablet daily except 1/2 tablet each Tuesday, Thursday and Saturday.  Repeat INR in 4 weeks.

## 2017-10-28 ENCOUNTER — Other Ambulatory Visit: Payer: Self-pay | Admitting: Cardiovascular Disease

## 2017-11-28 ENCOUNTER — Ambulatory Visit (INDEPENDENT_AMBULATORY_CARE_PROVIDER_SITE_OTHER): Payer: Medicare Other | Admitting: Pharmacist Clinician (PhC)/ Clinical Pharmacy Specialist

## 2017-11-28 DIAGNOSIS — Z7901 Long term (current) use of anticoagulants: Secondary | ICD-10-CM | POA: Diagnosis not present

## 2017-11-28 DIAGNOSIS — I480 Paroxysmal atrial fibrillation: Secondary | ICD-10-CM

## 2017-11-28 LAB — POCT INR: INR: 3

## 2017-11-28 NOTE — Patient Instructions (Signed)
Description   Continue with 1 tablet daily except 1/2 tablet each Tuesday, Thursday and Saturday.  Repeat INR in 4 weeks.

## 2017-12-06 ENCOUNTER — Other Ambulatory Visit: Payer: Self-pay | Admitting: Pharmacist Clinician (PhC)/ Clinical Pharmacy Specialist

## 2017-12-06 MED ORDER — METOPROLOL SUCCINATE ER 50 MG PO TB24: 50.0000 mg | ORAL_TABLET | Freq: Every day | ORAL | 3 refills | Status: DC

## 2017-12-23 ENCOUNTER — Telehealth: Payer: Self-pay | Admitting: Cardiology

## 2017-12-23 ENCOUNTER — Encounter: Payer: Medicare Other | Admitting: *Deleted

## 2017-12-23 NOTE — Telephone Encounter (Signed)
LMOVM reminding pt to send remote transmission.   

## 2017-12-25 ENCOUNTER — Ambulatory Visit (INDEPENDENT_AMBULATORY_CARE_PROVIDER_SITE_OTHER): Payer: Medicare Other | Admitting: Pharmacist Clinician (PhC)/ Clinical Pharmacy Specialist

## 2017-12-25 DIAGNOSIS — Z7901 Long term (current) use of anticoagulants: Secondary | ICD-10-CM | POA: Diagnosis not present

## 2017-12-25 DIAGNOSIS — I480 Paroxysmal atrial fibrillation: Secondary | ICD-10-CM

## 2017-12-25 LAB — POCT INR: INR: 2.6

## 2017-12-26 ENCOUNTER — Encounter: Payer: Self-pay | Admitting: Cardiology

## 2018-01-08 ENCOUNTER — Encounter: Payer: Self-pay | Admitting: Cardiovascular Disease

## 2018-01-08 ENCOUNTER — Ambulatory Visit: Payer: Medicare Other | Admitting: Cardiovascular Disease

## 2018-01-08 ENCOUNTER — Ambulatory Visit (INDEPENDENT_AMBULATORY_CARE_PROVIDER_SITE_OTHER): Payer: Medicare Other | Admitting: Pharmacist

## 2018-01-08 VITALS — BP 136/86 | HR 83 | Ht 66.5 in | Wt 193.4 lb

## 2018-01-08 DIAGNOSIS — Z950 Presence of cardiac pacemaker: Secondary | ICD-10-CM | POA: Diagnosis not present

## 2018-01-08 DIAGNOSIS — I495 Sick sinus syndrome: Secondary | ICD-10-CM

## 2018-01-08 DIAGNOSIS — I10 Essential (primary) hypertension: Secondary | ICD-10-CM

## 2018-01-08 DIAGNOSIS — I480 Paroxysmal atrial fibrillation: Secondary | ICD-10-CM

## 2018-01-08 DIAGNOSIS — Z7901 Long term (current) use of anticoagulants: Secondary | ICD-10-CM | POA: Diagnosis not present

## 2018-01-08 DIAGNOSIS — I5032 Chronic diastolic (congestive) heart failure: Secondary | ICD-10-CM | POA: Diagnosis not present

## 2018-01-08 DIAGNOSIS — R9431 Abnormal electrocardiogram [ECG] [EKG]: Secondary | ICD-10-CM | POA: Diagnosis not present

## 2018-01-08 LAB — POCT INR: INR: 3

## 2018-01-08 NOTE — Patient Instructions (Signed)

## 2018-01-08 NOTE — Progress Notes (Signed)
Patient ID: [name], male   DOB: 11/21/1933, 84 y.o.   MRN: [id number]    Cardiology Office Note    Date:  01/08/2018   ID:  [name], DOB June 04, 1934, MRN [id number]  PCP:  Sharilyn Sites, MD  Cardiologist:   Sanda Klein, MD   Chief Complaint  Patient presents with  . Follow-up    History of Present Illness:  [name] is a 84 y.o. male with recurrent persistent paroxysmal atrial fibrillation and tachycardia-bradycardia syndrome who returns for pacemaker check. He is here with his wife [name] who has remarkably similar medical problems.  He has not been able to do his usual physical activities since his wife was sick and had a long stay in the hospital and subsequently in a rehab facility.  He has gained some weight.  Part of this was fluid and resolved when he took more diuretics, but he has also gained some true weight.  The patient specifically denies any chest pain at rest exertion, dyspnea at rest or with exertion, orthopnea, paroxysmal nocturnal dyspnea, syncope, palpitations, focal neurological deficits, intermittent claudication,  cough, hemoptysis or wheezing.  As mentioned, he has had some weight gain and had transient problems with leg edema.  He has not had any serious bleeding problems while on anticoagulation with warfarin.  Pacemaker interrogation shows normal device function. He has 95.4% atrial pacing but only 0.3% ventricular pacing. Generator longevity is estimated that another 11 years. He has had an overall burden of atrial fibrillation of 11.8%, which is an increase from his previous pattern and is mostly due to a couple of lengthy episodes of atrial fibrillation in early December (a total of about 2 weeks all told.  As before, he always has excellent ventricular rate control (80s) during atrial fibrillation (Medtronic Adapta dual-chamber implanted 2015).  The pressure is borderline high today when he was first evaluated, better when it was rechecked  several minutes later.  He has a history of recurrent persistent atrial fibrillation, hypertension, GERD, diabetes, dual chamber Medtronic pacemaker for SSS. On 07/27/2014 the patient underwent generator change out.EF has usually been 50-55%, but dropped to 40-45% when he had broad QRS/flecainide toxicity. He did not have ischemia by his most recent nuclear stress test, EF normalized off flecainide. In 2007, cardiac catheterization showed a 40% proximal LAD stenosis, no other significant lesions. He is on chronic warfarin anticoagulation and has not had bleeding complications or stroke. In the past has had successful cardioversion as well as pace-termination of atrial flutter via his pacemaker.  Past Medical History:  Diagnosis Date  . Arthritis   . Atrial fibrillation ([place])   . Barrett's esophagus   . CAD (coronary artery disease)    echo 09-05-2010-EF nl, mod-severe dilated Left atrium; myoview 02-08-12-EF 52%, no ischemia  . Cancer [facility])    prostate - radiation only  . Diabetes mellitus without complication ([place])    "prediabetic"-no oral meds  . GERD (gastroesophageal reflux disease)   . Gout   . H/O transesophageal echocardiography (TEE) for monitoring 09/05/10   tee guided AT pace termination, did not proceed with cardioversion due to termination of the atrial flutter through his pacemaker  . History of cardioversion 02/09/13   electrical synchronized cardioversion, on flecainide and warfarin  . History of kidney stones    per pt - no actual diagnosisi  . History of stomach ulcers    many yrs ago  . HTN (hypertension)   . Pacemaker    medtronic  .  Pacemaker 09-25-13  . Prostate cancer ([place]) 09-25-13   tx. with radiation only- dx. 2013    Past Surgical History:  Procedure Laterality Date  . APPENDECTOMY    . CARDIAC CATHETERIZATION  02/18/06   EF 65%, focal napkin ring-like lesion into the prox LAD otherwise normal coronaries  . CARDIOVERSION N/A 02/09/2013   Procedure:  CARDIOVERSION;  Surgeon: Sanda Klein, MD;  Location: [facility];  Service: Cardiovascular;  Laterality: N/A;  . PACEMAKER GENERATOR CHANGE N/A 07/27/2014   Procedure: PACEMAKER GENERATOR CHANGE;  Surgeon: Sanda Klein, MD;  Location: [facility] CATH LAB;  Service: Cardiovascular;  Laterality: N/A;  . PACEMAKER INSERTION     medtronic adapta- Dr. Sallyanne Kuster follows- LOV 9'14  . TONSILLECTOMY    . TOTAL KNEE ARTHROPLASTY Right 10/05/2013   Procedure: RIGHT TOTAL KNEE ARTHROPLASTY;  Surgeon: Gearlean Alf, MD;  Location[facility];  Service: Orthopedics;  Laterality: Right;    Outpatient Medications Prior to Visit  Medication Sig Dispense Refill  . acetaminophen (TYLENOL) 650 MG CR tablet Take 650 mg by mouth every 8 (eight) hours as needed for pain.     . furosemide (LASIX) 20 MG tablet Take 1 tablet (20 mg total) by mouth as needed for fluid or edema. 30 tablet 3  . metoprolol succinate (TOPROL-XL) 50 MG 24 hr tablet Take 1 tablet (50 mg total) by mouth daily. 90 tablet 3  . Misc Natural Products (TART CHERRY ADVANCED PO) Take 1,200 mg by mouth daily.     . Omega-3 Fatty Acids (OMEGA 3 PO) Take 1 capsule by mouth daily.     . Potassium 99 MG TABS Take 1 tablet by mouth every other day.     . warfarin (COUMADIN) 2.5 MG tablet TAKE HALF TO 1 TABLET BY MOUTH DAILY AS DIRECTED BY COUMADIN CLINIC 90 tablet 1   No facility-administered medications prior to visit.      Allergies:   Shellfish-derived products and Tape   Social History   Socioeconomic History  . Marital status: Married    Spouse name: None  . Number of children: None  . Years of education: None  . Highest education level: None  Social Needs  . Financial resource strain: None  . Food insecurity - worry: None  . Food insecurity - inability: None  . Transportation needs - medical: None  . Transportation needs - non-medical: None  Occupational History  . None  Tobacco Use  . Smoking status: Former Smoker    Last attempt to  quit: 07/22/1970    Years since quitting: 47.4  . Smokeless tobacco: Never Used  . Tobacco comment: Quit in the 92s  Substance and Sexual Activity  . Alcohol use: No  . Drug use: No  . Sexual activity: Not Currently  Other Topics Concern  . None  Social History Narrative  . None     Family History:  The patient's family history includes Diabetes in his brother and mother.   ROS:   Please see the history of present illness.    ROS All other systems reviewed and are negative.   PHYSICAL EXAM:   VS:  BP 136/86   Pulse 83   Ht 5' 6.5" (1.689 m)   Wt 193 lb 6.4 oz (87.7 kg)   BMI 30.75 kg/m     General: Alert, oriented x3, no distress, borderline obese, healthy left subclavian pacemaker site Head: no evidence of trauma, PERRL, EOMI, no exophtalmos or lid lag, no myxedema, no xanthelasma; normal ears, Oconnor  and oropharynx Neck: normal jugular venous pulsations and no hepatojugular reflux; brisk carotid pulses without delay and no carotid bruits Chest: clear to auscultation, no signs of consolidation by percussion or palpation, normal fremitus, symmetrical and full respiratory excursions Cardiovascular: normal position and quality of the apical impulse, regular rhythm, normal first and second heart sounds, no murmurs, rubs or gallops Abdomen: no tenderness or distention, no masses by palpation, no abnormal pulsatility or arterial bruits, normal bowel sounds, no hepatosplenomegaly Extremities: no clubbing, cyanosis or edema; 2+ radial, ulnar and brachial pulses bilaterally; 2+ right femoral, posterior tibial and dorsalis pedis pulses; 2+ left femoral, posterior tibial and dorsalis pedis pulses; no subclavian or femoral bruits Neurological: grossly nonfocal Psych: Normal mood and affect  Wt Readings from Last 3 Encounters:  01/08/18 193 lb 6.4 oz (87.7 kg)  10/06/17 180 lb (81.6 kg)  12/12/16 194 lb (88 kg)      Studies/Labs Reviewed:   EKG:  EKG is ordered today.  The EKG today  shows atrial paced, ventricular sensed rhythm with a long AV conduction time of 328 ms, narrow QRS 98 ms, QTC 460 ms borderline prolonged  Recent Labs: September 24, 2017 Total cholesterol 203, HDL 45, LDL 132, triglycerides 129 Hemoglobin A1c 5.4%, hemoglobin 13.7, creatinine 0.51, potassium 3.5, TSH 2.53  ASSESSMENT:    1. Paroxysmal atrial fibrillation (HCC)   2. Pacemaker   3. SSS (sick sinus syndrome) ([place])   4. Long QT interval   5. Essential hypertension   6. Long term current use of anticoagulant   7. Chronic diastolic heart failure (HCC)      PLAN:  In order of problems listed above:  1. AFib:  increased 11 % burden of atrial fibrillation since last device check, but this is mostly made up of a couple of long episodes of atrial fibrillation in early December, since resolved. Rate control is very good. Antiarrhythmics are not justified. CHADSVasc score is high, at least 4 (age 18, CHF, HTN), maybe 6 if one counts CAD and borderline diabetes. 2. PPM normal device function, remote downloads every 3 months and yearly office visits: 3. SSS: He has mostly atrial paced rhythm, with a good heart rate histogram distribution. 4. Long QT: As before, borderline prolonged today, avoid QT prolonging agents 5. HTN: Fair control, his blood pressure is often high when he first checked into the clinic and then improves 6. Warfarin: Well-tolerated  7. CHF: Clinically well compensated, euvolemic on a very low dose of loop diuretic.  He is well versed in self dosing his diuretic for additional fluid gain.    Medication Adjustments/Labs and Tests Ordered: Current medicines are reviewed at length with the patient today.  Concerns regarding medicines are outlined above.  Medication changes, Labs and Tests ordered today are listed in the Patient Instructions below. Patient Instructions  Dr Sallyanne Kuster recommends that you continue on your current medications as directed. Please refer to the Current  Medication list given to you today.  Remote monitoring is used to monitor your Pacemaker or ICD from home. This monitoring reduces the number of office visits required to check your device to one time per year. It allows [place] to keep an eye on the functioning of your device to ensure it is working properly. You are scheduled for a device check from home on Tuesday, May 9th, 2019. You may send your transmission at any time that day. If you have a wireless device, the transmission will be sent automatically. After your physician reviews your  transmission, you will receive a notification with your next transmission date.  Dr Sallyanne Kuster recommends that you schedule a follow-up appointment in 12 months with a pacemaker check. You will receive a reminder letter in the mail two months in advance. If you don't receive a letter, please call our office to schedule the follow-up appointment.  If you need a refill on your cardiac medications before your next appointment, please call your pharmacy.      Signed, Sanda Klein, MD  01/08/2018 3:37 PM    [facility] [address], [place], [place]  [address] Phone: [phone]; Fax: [phone]

## 2018-01-15 LAB — CUP PACEART INCLINIC DEVICE CHECK
Date Time Interrogation Session: 20190313132535
Implantable Lead Implant Date: 20070417
Implantable Lead Location: 753859
Implantable Lead Location: 753860
Implantable Lead Model: 5092
Implantable Lead Model: 5594
Implantable Pulse Generator Implant Date: 20150922
Lead Channel Setting Pacing Amplitude: 2.25 V
MDC IDC LEAD IMPLANT DT: 20070417
MDC IDC SET LEADCHNL RA PACING AMPLITUDE: 1.5 V
MDC IDC SET LEADCHNL RV PACING PULSEWIDTH: 0.4 ms
MDC IDC SET LEADCHNL RV SENSING SENSITIVITY: 5.6 mV

## 2018-02-05 ENCOUNTER — Ambulatory Visit (INDEPENDENT_AMBULATORY_CARE_PROVIDER_SITE_OTHER): Payer: Medicare Other | Admitting: Pharmacist Clinician (PhC)/ Clinical Pharmacy Specialist

## 2018-02-05 DIAGNOSIS — Z7901 Long term (current) use of anticoagulants: Secondary | ICD-10-CM

## 2018-02-05 DIAGNOSIS — I480 Paroxysmal atrial fibrillation: Secondary | ICD-10-CM | POA: Diagnosis not present

## 2018-02-05 LAB — POCT INR: INR: 2.3

## 2018-02-05 NOTE — Patient Instructions (Signed)
Description   Continue with 1 tablet daily except 1/2 tablet each Tuesday, Thursday and Saturday.  Repeat INR in 6 weeks      

## 2018-03-13 ENCOUNTER — Ambulatory Visit (INDEPENDENT_AMBULATORY_CARE_PROVIDER_SITE_OTHER): Payer: Medicare Other | Admitting: *Deleted

## 2018-03-13 DIAGNOSIS — I495 Sick sinus syndrome: Secondary | ICD-10-CM | POA: Diagnosis not present

## 2018-03-13 NOTE — Progress Notes (Signed)
Remote pacemaker transmission.   

## 2018-03-18 ENCOUNTER — Ambulatory Visit (INDEPENDENT_AMBULATORY_CARE_PROVIDER_SITE_OTHER): Payer: Medicare Other | Admitting: Pharmacist

## 2018-03-18 ENCOUNTER — Encounter: Payer: Self-pay | Admitting: Cardiology

## 2018-03-18 DIAGNOSIS — Z7901 Long term (current) use of anticoagulants: Secondary | ICD-10-CM | POA: Diagnosis not present

## 2018-03-18 DIAGNOSIS — I480 Paroxysmal atrial fibrillation: Secondary | ICD-10-CM

## 2018-03-18 LAB — POCT INR: INR: 3.2

## 2018-03-18 NOTE — Progress Notes (Signed)
Letter  

## 2018-04-08 LAB — CUP PACEART REMOTE DEVICE CHECK
Battery Impedance: 186 Ohm
Brady Statistic AP VP Percent: 0 %
Brady Statistic AP VS Percent: 98 %
Brady Statistic AS VP Percent: 0 %
Brady Statistic AS VS Percent: 1 %
Implantable Lead Implant Date: 20070417
Implantable Lead Location: 753860
Implantable Lead Model: 5092
Lead Channel Impedance Value: 494 Ohm
Lead Channel Impedance Value: 693 Ohm
Lead Channel Pacing Threshold Amplitude: 0.5 V
Lead Channel Pacing Threshold Amplitude: 1.125 V
Lead Channel Pacing Threshold Pulse Width: 0.4 ms
Lead Channel Setting Pacing Amplitude: 2.25 V
Lead Channel Setting Pacing Pulse Width: 0.4 ms
Lead Channel Setting Sensing Sensitivity: 5.6 mV
MDC IDC LEAD IMPLANT DT: 20070417
MDC IDC LEAD LOCATION: 753859
MDC IDC MSMT BATTERY REMAINING LONGEVITY: 136 mo
MDC IDC MSMT BATTERY VOLTAGE: 2.79 V
MDC IDC MSMT LEADCHNL RV PACING THRESHOLD PULSEWIDTH: 0.4 ms
MDC IDC PG IMPLANT DT: 20150922
MDC IDC SESS DTM: 20190509151524
MDC IDC SET LEADCHNL RA PACING AMPLITUDE: 1.5 V

## 2018-04-16 ENCOUNTER — Ambulatory Visit (INDEPENDENT_AMBULATORY_CARE_PROVIDER_SITE_OTHER): Payer: Medicare Other | Admitting: Pharmacist

## 2018-04-16 DIAGNOSIS — I480 Paroxysmal atrial fibrillation: Secondary | ICD-10-CM | POA: Diagnosis not present

## 2018-04-16 DIAGNOSIS — Z7901 Long term (current) use of anticoagulants: Secondary | ICD-10-CM

## 2018-04-16 LAB — POCT INR: INR: 2.8 (ref 2.0–3.0)

## 2018-05-13 ENCOUNTER — Ambulatory Visit (INDEPENDENT_AMBULATORY_CARE_PROVIDER_SITE_OTHER): Payer: Medicare Other | Admitting: Pharmacist Clinician (PhC)/ Clinical Pharmacy Specialist

## 2018-05-13 DIAGNOSIS — I480 Paroxysmal atrial fibrillation: Secondary | ICD-10-CM | POA: Diagnosis not present

## 2018-05-13 DIAGNOSIS — Z7901 Long term (current) use of anticoagulants: Secondary | ICD-10-CM | POA: Diagnosis not present

## 2018-05-13 LAB — POCT INR: INR: 3.7 — AB (ref 2.0–3.0)

## 2018-05-13 NOTE — Patient Instructions (Signed)
Description   HOLD warfarin dose today Tuesday July 9, then continue with 1 tablet daily except 1/2 tablet each Tuesday, Thursday and Saturday.  Repeat INR in 3 weeks.

## 2018-05-19 ENCOUNTER — Other Ambulatory Visit: Payer: Self-pay | Admitting: Cardiovascular Disease

## 2018-06-02 ENCOUNTER — Ambulatory Visit (INDEPENDENT_AMBULATORY_CARE_PROVIDER_SITE_OTHER): Payer: Medicare Other | Admitting: Pharmacist Clinician (PhC)/ Clinical Pharmacy Specialist

## 2018-06-02 DIAGNOSIS — I480 Paroxysmal atrial fibrillation: Secondary | ICD-10-CM | POA: Diagnosis not present

## 2018-06-02 DIAGNOSIS — Z7901 Long term (current) use of anticoagulants: Secondary | ICD-10-CM

## 2018-06-02 LAB — POCT INR: INR: 2.3 (ref 2.0–3.0)

## 2018-06-12 ENCOUNTER — Ambulatory Visit (INDEPENDENT_AMBULATORY_CARE_PROVIDER_SITE_OTHER): Payer: Medicare Other | Admitting: *Deleted

## 2018-06-12 DIAGNOSIS — I480 Paroxysmal atrial fibrillation: Secondary | ICD-10-CM

## 2018-06-12 DIAGNOSIS — I495 Sick sinus syndrome: Secondary | ICD-10-CM

## 2018-06-13 ENCOUNTER — Telehealth: Payer: Self-pay

## 2018-06-13 NOTE — Progress Notes (Signed)
Remote pacemaker transmission.   

## 2018-06-13 NOTE — Telephone Encounter (Signed)
LMOVM reminding pt to send remote transmission.   

## 2018-06-17 ENCOUNTER — Telehealth: Payer: Self-pay | Admitting: Cardiovascular Disease

## 2018-06-17 NOTE — Telephone Encounter (Signed)
1. What dental office are you calling from? Dr Veleta Miners   2. What is your office phone number? [phone]   3. What is your fax number? [phone]  4. What type of procedure is the patient having performed? Tooth Extracted and it is abcessed   5. What date is procedure scheduled or is the patient there now? Pending   6. What is your question (ex. Antibiotics prior to procedure, holding medication-we need to know how long dentist wants pt to hold med)?  Does pt need to hold his Warfarin? If so, when and for how long?

## 2018-06-18 NOTE — Telephone Encounter (Signed)
   Primary Cardiologist: Dr Sallyanne Kuster  Chart reviewed as part of pre-operative protocol coverage. Given past medical history and time since last visit, based on ACC/AHA guidelines, MCGREGOR TINNON would be at acceptable risk for the planned procedure without further cardiovascular testing.   He does not need SBE prophylaxis. We do not recommend stopping anticoagulation for tooth extraction.   I will route this recommendation to the requesting party via Epic fax function and remove from pre-op pool.  Please call with questions.  Kerin Ransom, PA-C 06/18/2018, 1:43 PM

## 2018-06-30 ENCOUNTER — Ambulatory Visit (INDEPENDENT_AMBULATORY_CARE_PROVIDER_SITE_OTHER): Payer: Medicare Other | Admitting: Pharmacist

## 2018-06-30 DIAGNOSIS — Z7901 Long term (current) use of anticoagulants: Secondary | ICD-10-CM

## 2018-06-30 DIAGNOSIS — I480 Paroxysmal atrial fibrillation: Secondary | ICD-10-CM | POA: Diagnosis not present

## 2018-06-30 LAB — POCT INR: INR: 2.2 (ref 2.0–3.0)

## 2018-07-02 LAB — CUP PACEART REMOTE DEVICE CHECK
Brady Statistic AS VP Percent: 0 %
Brady Statistic AS VS Percent: 2 %
Date Time Interrogation Session: 20190809142813
Implantable Lead Implant Date: 20070417
Implantable Lead Implant Date: 20070417
Implantable Lead Location: 753860
Lead Channel Impedance Value: 627 Ohm
Lead Channel Pacing Threshold Amplitude: 1.125 V
Lead Channel Pacing Threshold Pulse Width: 0.4 ms
Lead Channel Setting Pacing Pulse Width: 0.4 ms
Lead Channel Setting Sensing Sensitivity: 5.6 mV
MDC IDC LEAD LOCATION: 753859
MDC IDC MSMT BATTERY IMPEDANCE: 210 Ohm
MDC IDC MSMT BATTERY REMAINING LONGEVITY: 131 mo
MDC IDC MSMT BATTERY VOLTAGE: 2.79 V
MDC IDC MSMT LEADCHNL RA IMPEDANCE VALUE: 509 Ohm
MDC IDC MSMT LEADCHNL RA PACING THRESHOLD AMPLITUDE: 0.5 V
MDC IDC MSMT LEADCHNL RA PACING THRESHOLD PULSEWIDTH: 0.4 ms
MDC IDC PG IMPLANT DT: 20150922
MDC IDC SET LEADCHNL RA PACING AMPLITUDE: 1.5 V
MDC IDC SET LEADCHNL RV PACING AMPLITUDE: 2.25 V
MDC IDC STAT BRADY AP VP PERCENT: 0 %
MDC IDC STAT BRADY AP VS PERCENT: 97 %

## 2018-07-30 ENCOUNTER — Ambulatory Visit (INDEPENDENT_AMBULATORY_CARE_PROVIDER_SITE_OTHER): Payer: Medicare Other | Admitting: Pharmacist

## 2018-07-30 DIAGNOSIS — I480 Paroxysmal atrial fibrillation: Secondary | ICD-10-CM

## 2018-07-30 DIAGNOSIS — Z7901 Long term (current) use of anticoagulants: Secondary | ICD-10-CM

## 2018-07-30 LAB — POCT INR: INR: 3 (ref 2.0–3.0)

## 2018-08-25 ENCOUNTER — Ambulatory Visit (INDEPENDENT_AMBULATORY_CARE_PROVIDER_SITE_OTHER): Payer: Medicare Other | Admitting: Pharmacist Clinician (PhC)/ Clinical Pharmacy Specialist

## 2018-08-25 DIAGNOSIS — Z7901 Long term (current) use of anticoagulants: Secondary | ICD-10-CM | POA: Diagnosis not present

## 2018-08-25 DIAGNOSIS — I4891 Unspecified atrial fibrillation: Secondary | ICD-10-CM | POA: Diagnosis not present

## 2018-08-25 DIAGNOSIS — I480 Paroxysmal atrial fibrillation: Secondary | ICD-10-CM | POA: Diagnosis not present

## 2018-08-25 LAB — POCT INR: INR: 2.9 (ref 2.0–3.0)

## 2018-09-11 ENCOUNTER — Ambulatory Visit (INDEPENDENT_AMBULATORY_CARE_PROVIDER_SITE_OTHER): Payer: Medicare Other | Admitting: *Deleted

## 2018-09-11 DIAGNOSIS — I495 Sick sinus syndrome: Secondary | ICD-10-CM | POA: Diagnosis not present

## 2018-09-11 NOTE — Progress Notes (Signed)
Remote pacemaker transmission.   

## 2018-09-14 ENCOUNTER — Encounter: Payer: Self-pay | Admitting: Cardiology

## 2018-09-22 ENCOUNTER — Ambulatory Visit (INDEPENDENT_AMBULATORY_CARE_PROVIDER_SITE_OTHER): Payer: Medicare Other | Admitting: Pharmacist

## 2018-09-22 DIAGNOSIS — I480 Paroxysmal atrial fibrillation: Secondary | ICD-10-CM

## 2018-09-22 DIAGNOSIS — Z7901 Long term (current) use of anticoagulants: Secondary | ICD-10-CM

## 2018-09-22 LAB — POCT INR: INR: 2.3 (ref 2.0–3.0)

## 2018-10-20 ENCOUNTER — Ambulatory Visit (INDEPENDENT_AMBULATORY_CARE_PROVIDER_SITE_OTHER): Payer: Medicare Other | Admitting: Pharmacist

## 2018-10-20 DIAGNOSIS — I480 Paroxysmal atrial fibrillation: Secondary | ICD-10-CM

## 2018-10-20 DIAGNOSIS — Z7901 Long term (current) use of anticoagulants: Secondary | ICD-10-CM

## 2018-10-20 LAB — POCT INR: INR: 4 — AB (ref 2.0–3.0)

## 2018-10-20 MED ORDER — FUROSEMIDE 20 MG PO TABS: 20.0000 mg | ORAL_TABLET | ORAL | 3 refills | Status: DC | PRN

## 2018-11-03 ENCOUNTER — Ambulatory Visit (INDEPENDENT_AMBULATORY_CARE_PROVIDER_SITE_OTHER): Payer: Medicare Other | Admitting: Pharmacist Clinician (PhC)/ Clinical Pharmacy Specialist

## 2018-11-03 DIAGNOSIS — I480 Paroxysmal atrial fibrillation: Secondary | ICD-10-CM

## 2018-11-03 DIAGNOSIS — Z7901 Long term (current) use of anticoagulants: Secondary | ICD-10-CM

## 2018-11-03 LAB — POCT INR: INR: 3 (ref 2.0–3.0)

## 2018-11-12 ENCOUNTER — Other Ambulatory Visit: Payer: Self-pay | Admitting: Cardiovascular Disease

## 2018-11-14 LAB — CUP PACEART REMOTE DEVICE CHECK
Battery Impedance: 210 Ohm
Battery Remaining Longevity: 130 mo
Battery Voltage: 2.79 V
Date Time Interrogation Session: 20191107160502
Implantable Lead Implant Date: 20070417
Implantable Lead Location: 753860
Implantable Lead Model: 5092
Implantable Pulse Generator Implant Date: 20150922
Lead Channel Pacing Threshold Amplitude: 0.5 V
Lead Channel Pacing Threshold Pulse Width: 0.4 ms
Lead Channel Pacing Threshold Pulse Width: 0.4 ms
Lead Channel Setting Pacing Amplitude: 1.5 V
Lead Channel Setting Pacing Amplitude: 2 V
Lead Channel Setting Pacing Pulse Width: 0.4 ms
Lead Channel Setting Sensing Sensitivity: 5.6 mV
MDC IDC LEAD IMPLANT DT: 20070417
MDC IDC LEAD LOCATION: 753859
MDC IDC MSMT LEADCHNL RA IMPEDANCE VALUE: 466 Ohm
MDC IDC MSMT LEADCHNL RV IMPEDANCE VALUE: 652 Ohm
MDC IDC MSMT LEADCHNL RV PACING THRESHOLD AMPLITUDE: 1 V
MDC IDC STAT BRADY AP VP PERCENT: 1 %
MDC IDC STAT BRADY AP VS PERCENT: 94 %
MDC IDC STAT BRADY AS VP PERCENT: 0 %
MDC IDC STAT BRADY AS VS PERCENT: 5 %

## 2018-11-17 ENCOUNTER — Ambulatory Visit (INDEPENDENT_AMBULATORY_CARE_PROVIDER_SITE_OTHER): Payer: Medicare Other | Admitting: Pharmacist

## 2018-11-17 DIAGNOSIS — Z7901 Long term (current) use of anticoagulants: Secondary | ICD-10-CM

## 2018-11-17 DIAGNOSIS — I480 Paroxysmal atrial fibrillation: Secondary | ICD-10-CM

## 2018-11-17 LAB — POCT INR: INR: 3.9 — AB (ref 2.0–3.0)

## 2018-12-03 ENCOUNTER — Other Ambulatory Visit: Payer: Self-pay | Admitting: Cardiovascular Disease

## 2018-12-05 ENCOUNTER — Ambulatory Visit (INDEPENDENT_AMBULATORY_CARE_PROVIDER_SITE_OTHER): Payer: Medicare Other | Admitting: *Deleted

## 2018-12-05 DIAGNOSIS — I4891 Unspecified atrial fibrillation: Secondary | ICD-10-CM | POA: Diagnosis not present

## 2018-12-05 DIAGNOSIS — Z7901 Long term (current) use of anticoagulants: Secondary | ICD-10-CM

## 2018-12-05 LAB — POCT INR: INR: 2.4 (ref 2.0–3.0)

## 2018-12-05 NOTE — Patient Instructions (Signed)
Description   Continue taking 1/2 pill everyday except 1 pill  each Monday, Wednesday and Friday.  Repeat INR in 2 weeks.

## 2018-12-15 ENCOUNTER — Ambulatory Visit (INDEPENDENT_AMBULATORY_CARE_PROVIDER_SITE_OTHER): Payer: Medicare Other | Admitting: Pharmacist

## 2018-12-15 DIAGNOSIS — I4891 Unspecified atrial fibrillation: Secondary | ICD-10-CM | POA: Diagnosis not present

## 2018-12-15 DIAGNOSIS — Z7901 Long term (current) use of anticoagulants: Secondary | ICD-10-CM | POA: Diagnosis not present

## 2018-12-15 LAB — POCT INR: INR: 2.4 (ref 2.0–3.0)

## 2018-12-23 ENCOUNTER — Ambulatory Visit (INDEPENDENT_AMBULATORY_CARE_PROVIDER_SITE_OTHER): Payer: Medicare Other

## 2018-12-23 DIAGNOSIS — I4891 Unspecified atrial fibrillation: Secondary | ICD-10-CM | POA: Diagnosis not present

## 2018-12-23 DIAGNOSIS — I495 Sick sinus syndrome: Secondary | ICD-10-CM

## 2018-12-24 LAB — CUP PACEART REMOTE DEVICE CHECK
Battery Impedance: 260 Ohm
Battery Remaining Longevity: 121 mo
Battery Voltage: 2.78 V
Brady Statistic AP VP Percent: 1 %
Brady Statistic AP VS Percent: 90 %
Brady Statistic AS VP Percent: 1 %
Brady Statistic AS VS Percent: 8 %
Date Time Interrogation Session: 20200218175830
Implantable Lead Implant Date: 20070417
Implantable Lead Implant Date: 20070417
Implantable Lead Location: 753859
Implantable Lead Location: 753860
Implantable Lead Model: 5092
Implantable Lead Model: 5594
Implantable Pulse Generator Implant Date: 20150922
Lead Channel Impedance Value: 459 Ohm
Lead Channel Impedance Value: 684 Ohm
Lead Channel Pacing Threshold Amplitude: 0.5 V
Lead Channel Pacing Threshold Pulse Width: 0.4 ms
Lead Channel Pacing Threshold Pulse Width: 0.4 ms
Lead Channel Setting Pacing Amplitude: 1.5 V
Lead Channel Setting Pacing Amplitude: 2 V
Lead Channel Setting Pacing Pulse Width: 0.4 ms
Lead Channel Setting Sensing Sensitivity: 5.6 mV
MDC IDC MSMT LEADCHNL RV PACING THRESHOLD AMPLITUDE: 0.75 V

## 2018-12-31 NOTE — Progress Notes (Signed)
Remote pacemaker transmission.   

## 2019-01-05 ENCOUNTER — Encounter: Payer: Self-pay | Admitting: Cardiology

## 2019-01-06 ENCOUNTER — Ambulatory Visit (INDEPENDENT_AMBULATORY_CARE_PROVIDER_SITE_OTHER): Payer: Medicare Other | Admitting: Pharmacist

## 2019-01-06 DIAGNOSIS — I4891 Unspecified atrial fibrillation: Secondary | ICD-10-CM

## 2019-01-06 DIAGNOSIS — Z7901 Long term (current) use of anticoagulants: Secondary | ICD-10-CM

## 2019-01-06 LAB — POCT INR: INR: 3.4 — AB (ref 2.0–3.0)

## 2019-01-26 ENCOUNTER — Telehealth: Payer: Self-pay

## 2019-01-26 NOTE — Telephone Encounter (Signed)

## 2019-01-27 ENCOUNTER — Other Ambulatory Visit: Payer: Self-pay

## 2019-01-27 ENCOUNTER — Ambulatory Visit (INDEPENDENT_AMBULATORY_CARE_PROVIDER_SITE_OTHER): Payer: Medicare Other | Admitting: *Deleted

## 2019-01-27 DIAGNOSIS — Z7901 Long term (current) use of anticoagulants: Secondary | ICD-10-CM | POA: Diagnosis not present

## 2019-01-27 DIAGNOSIS — I4891 Unspecified atrial fibrillation: Secondary | ICD-10-CM

## 2019-01-27 LAB — POCT INR: INR: 2.3 (ref 2.0–3.0)

## 2019-01-27 NOTE — Patient Instructions (Signed)
Description   Continue taking 1/2 pill everyday except 1 pill each Monday, Wednesday and Friday.  Repeat INR in 4 weeks.

## 2019-02-09 ENCOUNTER — Other Ambulatory Visit: Payer: Self-pay | Admitting: Cardiovascular Disease

## 2019-02-09 ENCOUNTER — Telehealth: Payer: Self-pay

## 2019-02-09 NOTE — Telephone Encounter (Signed)
   Cardiac Questionnaire:    Since your last visit or hospitalization:    1. Have you been having new or worsening chest pain? NO   2. Have you been having new or worsening shortness of breath? NO 3. Have you been having new or worsening leg swelling, wt gain, or increase in abdominal girth (pants fitting more tightly)? NO   4. Have you had any passing out spells? NO     

## 2019-02-09 NOTE — Telephone Encounter (Signed)
   Primary Cardiologist:  Sanda Klein, MD   Patient contacted.  History reviewed.  No symptoms to suggest any unstable cardiac conditions.  Based on discussion, with current pandemic situation, we will be postponing this appointment for [name] with a plan for f/u in >12 wks or sooner if feasible/necessary.  If symptoms change, he has been instructed to contact our office.   Routing to C19 CANCEL pool for tracking (P CV DIV CV19 CANCEL - reason for visit "other.") and assigning priority (1 = 4-6 wks, 2 = 6-12 wks, 3 = >12 wks).   [name], [place]  02/09/2019 9:44 AM

## 2019-02-11 ENCOUNTER — Encounter: Payer: Medicare Other | Admitting: Cardiovascular Disease

## 2019-02-23 ENCOUNTER — Telehealth: Payer: Self-pay

## 2019-02-23 NOTE — Telephone Encounter (Signed)

## 2019-02-24 ENCOUNTER — Other Ambulatory Visit: Payer: Self-pay

## 2019-02-24 ENCOUNTER — Ambulatory Visit (INDEPENDENT_AMBULATORY_CARE_PROVIDER_SITE_OTHER): Payer: Medicare Other | Admitting: *Deleted

## 2019-02-24 DIAGNOSIS — Z7901 Long term (current) use of anticoagulants: Secondary | ICD-10-CM

## 2019-02-24 DIAGNOSIS — I4891 Unspecified atrial fibrillation: Secondary | ICD-10-CM

## 2019-02-24 LAB — POCT INR: INR: 3.2 — AB (ref 2.0–3.0)

## 2019-03-23 ENCOUNTER — Telehealth: Payer: Self-pay

## 2019-03-23 NOTE — Telephone Encounter (Signed)

## 2019-03-24 ENCOUNTER — Encounter: Payer: Medicare Other | Admitting: *Deleted

## 2019-03-24 ENCOUNTER — Ambulatory Visit (INDEPENDENT_AMBULATORY_CARE_PROVIDER_SITE_OTHER): Payer: Medicare Other | Admitting: *Deleted

## 2019-03-24 ENCOUNTER — Other Ambulatory Visit: Payer: Self-pay

## 2019-03-24 DIAGNOSIS — I4891 Unspecified atrial fibrillation: Secondary | ICD-10-CM

## 2019-03-24 DIAGNOSIS — Z7901 Long term (current) use of anticoagulants: Secondary | ICD-10-CM | POA: Diagnosis not present

## 2019-03-24 LAB — POCT INR: INR: 2.1 (ref 2.0–3.0)

## 2019-03-24 NOTE — Patient Instructions (Addendum)
  Description   Spoke with pt and instructed pt to take 1 pill today then continue taking 1/2 pill everyday except 1 pill each Monday, Wednesday and Friday.  Repeat INR in 5 weeks.

## 2019-03-25 ENCOUNTER — Telehealth: Payer: Self-pay

## 2019-03-25 NOTE — Telephone Encounter (Signed)
Spoke with patient to remind of missed remote transmission 

## 2019-04-02 ENCOUNTER — Encounter: Payer: Self-pay | Admitting: Cardiology

## 2019-04-20 ENCOUNTER — Telehealth: Payer: Self-pay | Admitting: Cardiovascular Disease

## 2019-04-20 ENCOUNTER — Ambulatory Visit (INDEPENDENT_AMBULATORY_CARE_PROVIDER_SITE_OTHER): Payer: Medicare Other | Admitting: *Deleted

## 2019-04-20 DIAGNOSIS — I495 Sick sinus syndrome: Secondary | ICD-10-CM

## 2019-04-20 NOTE — Telephone Encounter (Signed)
Pt had sent his previous manual transmission too early to sync up with his appt (by 5 days). He states he is used to sending it on the same day his wife sends hers.  He cannot move the monitor into his bedroom due to per cellular signal, so has it plugged in the hall and must do remotes.   Added to Remote schedule for today. Confirmed transmission receipt.     Legrand Como [address]" Ar[place], PA-C 04/20/2019 3:02 PM

## 2019-04-20 NOTE — Telephone Encounter (Signed)
New Message   Patient calling about missed transmission please call him back.

## 2019-04-21 ENCOUNTER — Telehealth: Payer: Self-pay

## 2019-04-21 LAB — CUP PACEART REMOTE DEVICE CHECK
Battery Impedance: 284 Ohm
Battery Remaining Longevity: 114 mo
Battery Voltage: 2.78 V
Brady Statistic AP VP Percent: 1 %
Brady Statistic AP VS Percent: 89 %
Brady Statistic AS VP Percent: 2 %
Brady Statistic AS VS Percent: 9 %
Date Time Interrogation Session: 20200615185612
Implantable Lead Implant Date: 20070417
Implantable Lead Implant Date: 20070417
Implantable Lead Location: 753859
Implantable Lead Location: 753860
Implantable Lead Model: 5092
Implantable Lead Model: 5594
Implantable Pulse Generator Implant Date: 20150922
Lead Channel Impedance Value: 472 Ohm
Lead Channel Impedance Value: 707 Ohm
Lead Channel Pacing Threshold Amplitude: 0.5 V
Lead Channel Pacing Threshold Amplitude: 1.125 V
Lead Channel Pacing Threshold Pulse Width: 0.4 ms
Lead Channel Pacing Threshold Pulse Width: 0.4 ms
Lead Channel Setting Pacing Amplitude: 1.5 V
Lead Channel Setting Pacing Amplitude: 2.25 V
Lead Channel Setting Pacing Pulse Width: 0.4 ms
Lead Channel Setting Sensing Sensitivity: 5.6 mV

## 2019-04-21 NOTE — Telephone Encounter (Signed)

## 2019-04-27 ENCOUNTER — Encounter: Payer: Self-pay | Admitting: Cardiology

## 2019-04-27 NOTE — Progress Notes (Signed)
Remote pacemaker transmission.   

## 2019-04-28 ENCOUNTER — Ambulatory Visit (INDEPENDENT_AMBULATORY_CARE_PROVIDER_SITE_OTHER): Payer: Medicare Other | Admitting: Pharmacist Clinician (PhC)/ Clinical Pharmacy Specialist

## 2019-04-28 ENCOUNTER — Other Ambulatory Visit: Payer: Self-pay

## 2019-04-28 DIAGNOSIS — I4891 Unspecified atrial fibrillation: Secondary | ICD-10-CM | POA: Diagnosis not present

## 2019-04-28 DIAGNOSIS — Z7901 Long term (current) use of anticoagulants: Secondary | ICD-10-CM

## 2019-04-28 LAB — POCT INR: INR: 2.4 (ref 2.0–3.0)

## 2019-05-13 ENCOUNTER — Encounter: Payer: Self-pay | Admitting: Cardiovascular Disease

## 2019-05-13 ENCOUNTER — Other Ambulatory Visit: Payer: Self-pay

## 2019-05-13 ENCOUNTER — Ambulatory Visit (INDEPENDENT_AMBULATORY_CARE_PROVIDER_SITE_OTHER): Payer: Medicare Other | Admitting: Cardiovascular Disease

## 2019-05-13 VITALS — BP 132/88 | HR 60 | Temp 98.0°F | Resp 16

## 2019-05-13 DIAGNOSIS — Z7901 Long term (current) use of anticoagulants: Secondary | ICD-10-CM | POA: Diagnosis not present

## 2019-05-13 DIAGNOSIS — I495 Sick sinus syndrome: Secondary | ICD-10-CM

## 2019-05-13 DIAGNOSIS — I5032 Chronic diastolic (congestive) heart failure: Secondary | ICD-10-CM

## 2019-05-13 DIAGNOSIS — I4811 Longstanding persistent atrial fibrillation: Secondary | ICD-10-CM | POA: Diagnosis not present

## 2019-05-13 DIAGNOSIS — I10 Essential (primary) hypertension: Secondary | ICD-10-CM

## 2019-05-13 DIAGNOSIS — Z950 Presence of cardiac pacemaker: Secondary | ICD-10-CM | POA: Diagnosis not present

## 2019-05-13 NOTE — Progress Notes (Signed)
Patient ID: [name], male   DOB: 1934/08/21, 85 y.o.   MRN: [id number]    Cardiology Office Note    Date:  05/13/2019   ID:  [name], DOB 22-Apr-1934, MRN [id number]  PCP:  Sharilyn Sites, MD  Cardiologist:   Sanda Klein, MD   Chief Complaint  Patient presents with  . Atrial Fibrillation  . Pacemaker Check    History of Present Illness:  [name] is a 85 y.o. male with recurrent persistent paroxysmal atrial fibrillation and tachycardia-bradycardia syndrome who returns for pacemaker check. He is here with his wife [name] who has remarkably similar medical problems.  The patient specifically denies any chest pain at rest exertion, dyspnea at rest or with exertion, orthopnea, paroxysmal nocturnal dyspnea, syncope, palpitations, focal neurological deficits, intermittent claudication, lower extremity edema, unexplained weight gain, cough, hemoptysis or wheezing.  He only takes an occasional dose of furosemide for ankle swelling, no more than once or twice a week.  Pacemaker interrogation shows normal device function, but he has been in uninterrupted atrial fibrillation since last year.  Rate control is adequate and heart rate histogram distribution is appropriate.  Battery longevity is estimated at 9.5 years.  Lead parameters are excellent.  He has only 2.4% ventricular pacing, but is in paced rhythm today. (Medtronic Adapta dual-chamber implanted 2015).  He has a history of recurrent persistent atrial fibrillation, hypertension, GERD, diabetes, dual chamber Medtronic pacemaker for SSS. On 07/27/2014 the patient underwent generator change out.EF has usually been 50-55%, but dropped to 40-45% when he had broad QRS/flecainide toxicity. He did not have ischemia by his most recent nuclear stress test, EF normalized off flecainide. In 2007, cardiac catheterization showed a 40% proximal LAD stenosis, no other significant lesions. He is on chronic warfarin anticoagulation and has not  had bleeding complications or stroke. In the past has had successful cardioversion as well as pace-termination of atrial flutter via his pacemaker.  Past Medical History:  Diagnosis Date  . Arthritis   . Atrial fibrillation ([place])   . Barrett's esophagus   . CAD (coronary artery disease)    echo 09-05-2010-EF nl, mod-severe dilated Left atrium; myoview 02-08-12-EF 52%, no ischemia  . Cancer [facility])    prostate - radiation only  . Diabetes mellitus without complication ([place])    "prediabetic"-no oral meds  . GERD (gastroesophageal reflux disease)   . Gout   . H/O transesophageal echocardiography (TEE) for monitoring 09/05/10   tee guided AT pace termination, did not proceed with cardioversion due to termination of the atrial flutter through his pacemaker  . History of cardioversion 02/09/13   electrical synchronized cardioversion, on flecainide and warfarin  . History of kidney stones    per pt - no actual diagnosisi  . History of stomach ulcers    many yrs ago  . HTN (hypertension)   . Pacemaker    medtronic  . Pacemaker 09-25-13  . Prostate cancer ([place]) 09-25-13   tx. with radiation only- dx. 2013    Past Surgical History:  Procedure Laterality Date  . APPENDECTOMY    . CARDIAC CATHETERIZATION  02/18/06   EF 65%, focal napkin ring-like lesion into the prox LAD otherwise normal coronaries  . CARDIOVERSION N/A 02/09/2013   Procedure: CARDIOVERSION;  Surgeon: Sanda Klein, MD;  Location: [facility] ENDOSCOPY;  Service: Cardiovascular;  Laterality: N/A;  . PACEMAKER GENERATOR CHANGE N/A 07/27/2014   Procedure: PACEMAKER GENERATOR CHANGE;  Surgeon: Sanda Klein, MD;  Location: [facility] CATH LAB;  Service: Cardiovascular;  Laterality: N/A;  . PACEMAKER INSERTION     medtronic adapta- Dr. Sallyanne Kuster follows- LOV 9'14  . TONSILLECTOMY    . TOTAL KNEE ARTHROPLASTY Right 10/05/2013   Procedure: RIGHT TOTAL KNEE ARTHROPLASTY;  Surgeon: Gearlean Alf, MD;  Location: [facility] ORS;  Service: Orthopedics;   Laterality: Right;    Outpatient Medications Prior to Visit  Medication Sig Dispense Refill  . furosemide (LASIX) 20 MG tablet Take 1 tablet (20 mg total) by mouth as needed for fluid or edema. Pt must make appt to get further refills 90 tablet 0  . metoprolol succinate (TOPROL-XL) 50 MG 24 hr tablet TAKE 1 TABLET BY MOUTH EVERY DAY 90 tablet 3  . Omega-3 Fatty Acids (OMEGA 3 PO) Take 1 capsule by mouth daily.     . Potassium 99 MG TABS Take 1 tablet by mouth as needed.     . warfarin (COUMADIN) 2.5 MG tablet TAKE 1/2 TO 1 TABLET BY MOUTH DAILY AS DIRECTED BY COUMADIN CLINIC 90 tablet 1  . acetaminophen (TYLENOL) 650 MG CR tablet Take 650 mg by mouth every 8 (eight) hours as needed for pain.     . Misc Natural Products (TART CHERRY ADVANCED PO) Take 1,200 mg by mouth daily.      No facility-administered medications prior to visit.      Allergies:   Shellfish-derived products and Tape   Social History   Socioeconomic History  . Marital status: Married    Spouse name: Not on file  . Number of children: Not on file  . Years of education: Not on file  . Highest education level: Not on file  Occupational History  . Not on file  Social Needs  . Financial resource strain: Not on file  . Food insecurity    Worry: Not on file    Inability: Not on file  . Transportation needs    Medical: Not on file    Non-medical: Not on file  Tobacco Use  . Smoking status: Former Smoker    Quit date: 07/22/1970    Years since quitting: 48.8  . Smokeless tobacco: Never Used  . Tobacco comment: Quit in the 32s  Substance and Sexual Activity  . Alcohol use: No  . Drug use: No  . Sexual activity: Not Currently  Lifestyle  . Physical activity    Days per week: Not on file    Minutes per session: Not on file  . Stress: Not on file  Relationships  . Social Herbalist on phone: Not on file    Gets together: Not on file    Attends religious service: Not on file    Active member of club  or organization: Not on file    Attends meetings of clubs or organizations: Not on file    Relationship status: Not on file  Other Topics Concern  . Not on file  Social History Narrative  . Not on file     Family History:  The patient's family history includes Diabetes in his brother and mother.   ROS:   Please see the history of present illness.    ROS All other systems reviewed and are negative.   PHYSICAL EXAM:   VS:  BP 132/88   Pulse 60   Temp 98 F (36.7 C)   Resp 16      General: Alert, oriented x3, no distress, Mildly obese.  Healthy left subclavian pacemaker site Head: no evidence of trauma, PERRL, EOMI, no exophtalmos  or lid lag, no myxedema, no xanthelasma; normal ears, nose and oropharynx Neck: normal jugular venous pulsations and no hepatojugular reflux; brisk carotid pulses without delay and no carotid bruits Chest: clear to auscultation, no signs of consolidation by percussion or palpation, normal fremitus, symmetrical and full respiratory excursions Cardiovascular: normal position and quality of the apical impulse, regular rhythm, normal first and paradoxically split second heart sounds, no murmurs, rubs or gallops Abdomen: no tenderness or distention, no masses by palpation, no abnormal pulsatility or arterial bruits, normal bowel sounds, no hepatosplenomegaly Extremities: no clubbing, cyanosis or edema; 2+ radial, ulnar and brachial pulses bilaterally; 2+ right femoral, posterior tibial and dorsalis pedis pulses; 2+ left femoral, posterior tibial and dorsalis pedis pulses; no subclavian or femoral bruits Neurological: grossly nonfocal Psych: Normal mood and affect   Wt Readings from Last 3 Encounters:  01/08/18 193 lb 6.4 oz (87.7 kg)  10/06/17 180 lb (81.6 kg)  12/12/16 194 lb (88 kg)      Studies/Labs Reviewed:   EKG:  EKG is notrdered today.  The EKG from January 08, 2018 shows atrial paced, ventricular sensed rhythm with a long AV conduction time of 328  ms, narrow QRS 98 ms, QTC 460 ms borderline prolonged.  The intracardiac electrogram today shows atrial fibrillation and ventricular paced rhythm.  Recent Labs: September 24, 2017 Total cholesterol 203, HDL 45, LDL 132, triglycerides 129 Hemoglobin A1c 5.4%, hemoglobin 13.7, creatinine 0.51, potassium 3.5, TSH 2.53  ASSESSMENT:    1. Longstanding persistent atrial fibrillation   2. Pacemaker   3. SSS (sick sinus syndrome) ([place])   4. Essential hypertension   5. Long term current use of anticoagulant therapy   6. Chronic diastolic heart failure (HCC)      PLAN:  In order of problems listed above:  1. AFib: Has been uninterrupted atrial fibrillation for well over 6 months.  No change in symptoms with atrial fibrillation.  Rate control is good. Antiarrhythmics are not justified. CHADSVasc score is high, at least 4 (age 60, CHF, HTN), maybe 6 if one counts CAD and borderline diabetes.  Continue anticoagulation. 2. PPM normal pacemaker function.  Device reprogrammed VVIR today. 3. SSS: No longer an issue with what is likely to become permanent atrial fibrillation.  Good heart rate histogram distribution. 4. Long QT: ECG was not checked today.  Avoid QT prolonging agents 5. HTN: Adequate control, diastolic blood pressure is a little high but usually is better than this. 6. Warfarin: Well-tolerated.  Almost always in therapeutic range.  No bleeding complications. 7. CHF: Appears well compensated and euvolemic.  Only needs intermittent loop diuretic therapy.    Medication Adjustments/Labs and Tests Ordered: Current medicines are reviewed at length with the patient today.  Concerns regarding medicines are outlined above.  Medication changes, Labs and Tests ordered today are listed in the Patient Instructions below. Patient Instructions  Medication Instructions:  Your physician recommends that you continue on your current medications as directed. Please refer to the Current Medication list  given to you today.  If you need a refill on your cardiac medications before your next appointment, please call your pharmacy.   Lab work: None ordered  Testing/Procedures: None ordered  Follow-Up: At [facility], you and your health needs are our priority.  As part of our continuing mission to provide you with exceptional heart care, we have created designated Provider Care Teams.  These Care Teams include your primary Cardiologist (physician) and Advanced Practice Providers (APPs -  Physician Assistants and  Nurse Practitioners) who all work together to provide you with the care you need, when you need it. You will need a follow up appointment in 12 months.  Please call our office 2 months in advance to schedule this appointment.  You may see Sanda Klein, MD or one of the following Advanced Practice Providers on your designated Care Team: Almyra Deforest, PA-C Fabian Sharp, [place]           Signed, Sanda Klein, MD  05/13/2019 11:23 AM    [facility] [address], [place], [place]  [id number] Phone: [phone]; Fax: [phone]

## 2019-05-13 NOTE — Patient Instructions (Signed)

## 2019-06-03 ENCOUNTER — Other Ambulatory Visit: Payer: Self-pay

## 2019-06-03 ENCOUNTER — Ambulatory Visit (INDEPENDENT_AMBULATORY_CARE_PROVIDER_SITE_OTHER): Payer: Medicare Other | Admitting: Pharmacist

## 2019-06-03 DIAGNOSIS — Z7901 Long term (current) use of anticoagulants: Secondary | ICD-10-CM

## 2019-06-03 DIAGNOSIS — I4891 Unspecified atrial fibrillation: Secondary | ICD-10-CM

## 2019-06-03 LAB — POCT INR: INR: 2.5 (ref 2.0–3.0)

## 2019-06-05 ENCOUNTER — Other Ambulatory Visit: Payer: Self-pay | Admitting: Cardiovascular Disease

## 2019-07-02 ENCOUNTER — Ambulatory Visit (INDEPENDENT_AMBULATORY_CARE_PROVIDER_SITE_OTHER): Payer: Medicare Other | Admitting: Pharmacist Clinician (PhC)/ Clinical Pharmacy Specialist

## 2019-07-02 ENCOUNTER — Other Ambulatory Visit: Payer: Self-pay

## 2019-07-02 DIAGNOSIS — I4891 Unspecified atrial fibrillation: Secondary | ICD-10-CM | POA: Diagnosis not present

## 2019-07-02 DIAGNOSIS — Z7901 Long term (current) use of anticoagulants: Secondary | ICD-10-CM

## 2019-07-02 LAB — POCT INR: INR: 2.5 (ref 2.0–3.0)

## 2019-07-20 ENCOUNTER — Ambulatory Visit (INDEPENDENT_AMBULATORY_CARE_PROVIDER_SITE_OTHER): Payer: Medicare Other | Admitting: *Deleted

## 2019-07-20 DIAGNOSIS — I495 Sick sinus syndrome: Secondary | ICD-10-CM

## 2019-07-20 DIAGNOSIS — I4811 Longstanding persistent atrial fibrillation: Secondary | ICD-10-CM

## 2019-07-20 LAB — CUP PACEART REMOTE DEVICE CHECK
Battery Impedance: 284 Ohm
Battery Remaining Longevity: 129 mo
Battery Voltage: 2.79 V
Brady Statistic RV Percent Paced: 95 %
Date Time Interrogation Session: 20200914133547
Implantable Lead Implant Date: 20070417
Implantable Lead Implant Date: 20070417
Implantable Lead Location: 753859
Implantable Lead Location: 753860
Implantable Lead Model: 5092
Implantable Lead Model: 5594
Implantable Pulse Generator Implant Date: 20150922
Lead Channel Impedance Value: 67 Ohm
Lead Channel Impedance Value: 684 Ohm
Lead Channel Pacing Threshold Amplitude: 1 V
Lead Channel Pacing Threshold Pulse Width: 0.4 ms
Lead Channel Setting Pacing Amplitude: 2 V
Lead Channel Setting Pacing Pulse Width: 0.4 ms
Lead Channel Setting Sensing Sensitivity: 5.6 mV

## 2019-07-31 NOTE — Progress Notes (Signed)
Remote pacemaker transmission.   

## 2019-08-13 ENCOUNTER — Telehealth: Payer: Self-pay

## 2019-08-13 NOTE — Telephone Encounter (Signed)
lmomed the pt to possibly move the appt back about 45 mins awaiting a callback

## 2019-08-18 ENCOUNTER — Other Ambulatory Visit: Payer: Self-pay

## 2019-08-18 DIAGNOSIS — Z20822 Contact with and (suspected) exposure to covid-19: Secondary | ICD-10-CM

## 2019-08-20 LAB — NOVEL CORONAVIRUS, NAA: SARS-CoV-2, NAA: NOT DETECTED

## 2019-09-09 ENCOUNTER — Other Ambulatory Visit: Payer: Self-pay

## 2019-09-09 ENCOUNTER — Ambulatory Visit (INDEPENDENT_AMBULATORY_CARE_PROVIDER_SITE_OTHER): Payer: Medicare Other | Admitting: Pharmacist

## 2019-09-09 DIAGNOSIS — I4891 Unspecified atrial fibrillation: Secondary | ICD-10-CM

## 2019-09-09 DIAGNOSIS — Z7901 Long term (current) use of anticoagulants: Secondary | ICD-10-CM

## 2019-09-09 LAB — POCT INR: INR: 2.4 (ref 2.0–3.0)

## 2019-10-19 ENCOUNTER — Ambulatory Visit (INDEPENDENT_AMBULATORY_CARE_PROVIDER_SITE_OTHER): Payer: Medicare Other | Admitting: *Deleted

## 2019-10-19 DIAGNOSIS — R001 Bradycardia, unspecified: Secondary | ICD-10-CM

## 2019-10-19 LAB — CUP PACEART REMOTE DEVICE CHECK
Battery Impedance: 284 Ohm
Battery Remaining Longevity: 130 mo
Battery Voltage: 2.79 V
Brady Statistic RV Percent Paced: 93 %
Date Time Interrogation Session: 20201214115130
Implantable Lead Implant Date: 20070417
Implantable Lead Implant Date: 20070417
Implantable Lead Location: 753859
Implantable Lead Location: 753860
Implantable Lead Model: 5092
Implantable Lead Model: 5594
Implantable Pulse Generator Implant Date: 20150922
Lead Channel Impedance Value: 67 Ohm
Lead Channel Impedance Value: 711 Ohm
Lead Channel Pacing Threshold Amplitude: 0.875 V
Lead Channel Pacing Threshold Pulse Width: 0.4 ms
Lead Channel Setting Pacing Amplitude: 2 V
Lead Channel Setting Pacing Pulse Width: 0.4 ms
Lead Channel Setting Sensing Sensitivity: 5.6 mV

## 2019-10-23 ENCOUNTER — Other Ambulatory Visit: Payer: Self-pay

## 2019-10-23 ENCOUNTER — Ambulatory Visit (INDEPENDENT_AMBULATORY_CARE_PROVIDER_SITE_OTHER): Payer: Medicare Other | Admitting: Pharmacist

## 2019-10-23 DIAGNOSIS — Z7901 Long term (current) use of anticoagulants: Secondary | ICD-10-CM

## 2019-10-23 DIAGNOSIS — I4891 Unspecified atrial fibrillation: Secondary | ICD-10-CM | POA: Diagnosis not present

## 2019-10-23 LAB — POCT INR: INR: 2.5 (ref 2.0–3.0)

## 2019-11-20 ENCOUNTER — Ambulatory Visit (INDEPENDENT_AMBULATORY_CARE_PROVIDER_SITE_OTHER): Payer: Medicare Other | Admitting: Pharmacist

## 2019-11-20 ENCOUNTER — Other Ambulatory Visit: Payer: Self-pay

## 2019-11-20 ENCOUNTER — Encounter: Payer: Self-pay | Admitting: Pharmacist

## 2019-11-20 DIAGNOSIS — Z7901 Long term (current) use of anticoagulants: Secondary | ICD-10-CM | POA: Diagnosis not present

## 2019-11-20 DIAGNOSIS — I4891 Unspecified atrial fibrillation: Secondary | ICD-10-CM

## 2019-11-20 DIAGNOSIS — I4811 Longstanding persistent atrial fibrillation: Secondary | ICD-10-CM

## 2019-11-20 LAB — POCT INR: INR: 2.5 (ref 2.0–3.0)

## 2019-11-21 NOTE — Progress Notes (Signed)
PPM remote 

## 2019-12-02 ENCOUNTER — Other Ambulatory Visit: Payer: Self-pay | Admitting: Cardiovascular Disease

## 2019-12-02 MED ORDER — METOPROLOL SUCCINATE ER 50 MG PO TB24: 50.0000 mg | ORAL_TABLET | Freq: Every day | ORAL | 1 refills | Status: DC

## 2019-12-02 NOTE — Telephone Encounter (Signed)
°*  STAT* If patient is at the pharmacy, call can be transferred to refill team.   1. Which medications need to be refilled? (please list name of each medication and dose if known)   metoprolol succinate (TOPROL-XL) 50 MG 24 hr tablet  2. Which pharmacy/location (including street and city if local pharmacy) is medication to be sent to?  [id number] - [place], [place] [address]  3. Do they need a 30 day or 90 day supply? 90 with refills    Patient is out of medication

## 2020-01-01 ENCOUNTER — Ambulatory Visit (INDEPENDENT_AMBULATORY_CARE_PROVIDER_SITE_OTHER): Payer: Medicare Other | Admitting: Pharmacist

## 2020-01-01 ENCOUNTER — Other Ambulatory Visit: Payer: Self-pay

## 2020-01-01 DIAGNOSIS — Z7901 Long term (current) use of anticoagulants: Secondary | ICD-10-CM

## 2020-01-01 DIAGNOSIS — I4811 Longstanding persistent atrial fibrillation: Secondary | ICD-10-CM

## 2020-01-01 DIAGNOSIS — I4891 Unspecified atrial fibrillation: Secondary | ICD-10-CM | POA: Diagnosis not present

## 2020-01-01 LAB — POCT INR: INR: 3.5 — AB (ref 2.0–3.0)

## 2020-01-04 ENCOUNTER — Telehealth: Payer: Self-pay | Admitting: Cardiovascular Disease

## 2020-01-04 MED ORDER — FUROSEMIDE 20 MG PO TABS: 20.0000 mg | ORAL_TABLET | ORAL | 0 refills | Status: DC | PRN

## 2020-01-04 NOTE — Telephone Encounter (Signed)
Sent RX

## 2020-01-04 NOTE — Telephone Encounter (Signed)
New Message  Pt c/o medication issue:  1. Name of Medication:  furosemide (LASIX) 20 MG tablet  2. How are you currently taking this medication (dosage and times per day)? As written  3. Are you having a reaction (difficulty breathing--STAT)? No  4. What is your medication issue? Pt is out of medication. Pt is out of medication and needs a new prescription.

## 2020-01-18 ENCOUNTER — Ambulatory Visit (INDEPENDENT_AMBULATORY_CARE_PROVIDER_SITE_OTHER): Payer: Medicare Other | Admitting: *Deleted

## 2020-01-18 DIAGNOSIS — R001 Bradycardia, unspecified: Secondary | ICD-10-CM | POA: Diagnosis not present

## 2020-01-18 LAB — CUP PACEART REMOTE DEVICE CHECK
Battery Impedance: 333 Ohm
Battery Remaining Longevity: 122 mo
Battery Voltage: 2.78 V
Brady Statistic RV Percent Paced: 95 %
Date Time Interrogation Session: 20210315091647
Implantable Lead Implant Date: 20070417
Implantable Lead Implant Date: 20070417
Implantable Lead Location: 753859
Implantable Lead Location: 753860
Implantable Lead Model: 5092
Implantable Lead Model: 5594
Implantable Pulse Generator Implant Date: 20150922
Lead Channel Impedance Value: 647 Ohm
Lead Channel Impedance Value: 67 Ohm
Lead Channel Pacing Threshold Amplitude: 0.875 V
Lead Channel Pacing Threshold Pulse Width: 0.4 ms
Lead Channel Setting Pacing Amplitude: 2 V
Lead Channel Setting Pacing Pulse Width: 0.4 ms
Lead Channel Setting Sensing Sensitivity: 5.6 mV

## 2020-01-18 NOTE — Progress Notes (Signed)
PPM Remote  

## 2020-02-01 ENCOUNTER — Other Ambulatory Visit: Payer: Self-pay

## 2020-02-01 ENCOUNTER — Ambulatory Visit (INDEPENDENT_AMBULATORY_CARE_PROVIDER_SITE_OTHER): Payer: Medicare Other | Admitting: Pharmacist

## 2020-02-01 DIAGNOSIS — I4891 Unspecified atrial fibrillation: Secondary | ICD-10-CM

## 2020-02-01 DIAGNOSIS — Z7901 Long term (current) use of anticoagulants: Secondary | ICD-10-CM

## 2020-02-01 DIAGNOSIS — I4811 Longstanding persistent atrial fibrillation: Secondary | ICD-10-CM

## 2020-02-01 LAB — POCT INR: INR: 2.1 (ref 2.0–3.0)

## 2020-02-29 ENCOUNTER — Other Ambulatory Visit: Payer: Self-pay

## 2020-02-29 ENCOUNTER — Ambulatory Visit (INDEPENDENT_AMBULATORY_CARE_PROVIDER_SITE_OTHER): Payer: Medicare Other | Admitting: Pharmacist Clinician (PhC)/ Clinical Pharmacy Specialist

## 2020-02-29 DIAGNOSIS — I4891 Unspecified atrial fibrillation: Secondary | ICD-10-CM | POA: Diagnosis not present

## 2020-02-29 DIAGNOSIS — I4811 Longstanding persistent atrial fibrillation: Secondary | ICD-10-CM | POA: Diagnosis not present

## 2020-02-29 DIAGNOSIS — Z7901 Long term (current) use of anticoagulants: Secondary | ICD-10-CM

## 2020-02-29 LAB — POCT INR: INR: 2.1 (ref 2.0–3.0)

## 2020-03-28 ENCOUNTER — Other Ambulatory Visit: Payer: Self-pay

## 2020-03-28 ENCOUNTER — Ambulatory Visit (INDEPENDENT_AMBULATORY_CARE_PROVIDER_SITE_OTHER): Payer: Medicare Other | Admitting: Pharmacist

## 2020-03-28 DIAGNOSIS — Z7901 Long term (current) use of anticoagulants: Secondary | ICD-10-CM

## 2020-03-28 DIAGNOSIS — I4891 Unspecified atrial fibrillation: Secondary | ICD-10-CM

## 2020-03-28 LAB — POCT INR: INR: 2.4 (ref 2.0–3.0)

## 2020-04-25 ENCOUNTER — Ambulatory Visit (INDEPENDENT_AMBULATORY_CARE_PROVIDER_SITE_OTHER): Payer: Medicare Other | Admitting: Pharmacist Clinician (PhC)/ Clinical Pharmacy Specialist

## 2020-04-25 ENCOUNTER — Other Ambulatory Visit: Payer: Self-pay

## 2020-04-25 DIAGNOSIS — Z7901 Long term (current) use of anticoagulants: Secondary | ICD-10-CM | POA: Diagnosis not present

## 2020-04-25 DIAGNOSIS — I4891 Unspecified atrial fibrillation: Secondary | ICD-10-CM

## 2020-04-25 LAB — POCT INR: INR: 2.3 (ref 2.0–3.0)

## 2020-05-05 ENCOUNTER — Ambulatory Visit (INDEPENDENT_AMBULATORY_CARE_PROVIDER_SITE_OTHER): Payer: Medicare Other | Admitting: *Deleted

## 2020-05-05 DIAGNOSIS — I495 Sick sinus syndrome: Secondary | ICD-10-CM | POA: Diagnosis not present

## 2020-05-05 LAB — CUP PACEART REMOTE DEVICE CHECK
Battery Impedance: 333 Ohm
Battery Remaining Longevity: 122 mo
Battery Voltage: 2.78 V
Brady Statistic RV Percent Paced: 96 %
Date Time Interrogation Session: 20210630112224
Implantable Lead Implant Date: 20070417
Implantable Lead Implant Date: 20070417
Implantable Lead Location: 753859
Implantable Lead Location: 753860
Implantable Lead Model: 5092
Implantable Lead Model: 5594
Implantable Pulse Generator Implant Date: 20150922
Lead Channel Impedance Value: 647 Ohm
Lead Channel Impedance Value: 67 Ohm
Lead Channel Pacing Threshold Amplitude: 0.875 V
Lead Channel Pacing Threshold Pulse Width: 0.4 ms
Lead Channel Setting Pacing Amplitude: 2 V
Lead Channel Setting Pacing Pulse Width: 0.4 ms
Lead Channel Setting Sensing Sensitivity: 5.6 mV

## 2020-05-06 NOTE — Progress Notes (Signed)
Remote pacemaker transmission.   

## 2020-05-23 ENCOUNTER — Other Ambulatory Visit: Payer: Self-pay | Admitting: Cardiovascular Disease

## 2020-05-26 ENCOUNTER — Other Ambulatory Visit: Payer: Self-pay

## 2020-05-26 ENCOUNTER — Ambulatory Visit (INDEPENDENT_AMBULATORY_CARE_PROVIDER_SITE_OTHER): Payer: Medicare Other | Admitting: Pharmacist Clinician (PhC)/ Clinical Pharmacy Specialist

## 2020-05-26 ENCOUNTER — Ambulatory Visit (INDEPENDENT_AMBULATORY_CARE_PROVIDER_SITE_OTHER): Payer: Medicare Other | Admitting: Cardiovascular Disease

## 2020-05-26 ENCOUNTER — Encounter: Payer: Self-pay | Admitting: Cardiovascular Disease

## 2020-05-26 VITALS — BP 142/82 | HR 74 | Ht 66.5 in | Wt 191.0 lb

## 2020-05-26 DIAGNOSIS — Z7901 Long term (current) use of anticoagulants: Secondary | ICD-10-CM

## 2020-05-26 DIAGNOSIS — F4321 Adjustment disorder with depressed mood: Secondary | ICD-10-CM

## 2020-05-26 DIAGNOSIS — I4821 Permanent atrial fibrillation: Secondary | ICD-10-CM

## 2020-05-26 DIAGNOSIS — Z950 Presence of cardiac pacemaker: Secondary | ICD-10-CM | POA: Diagnosis not present

## 2020-05-26 DIAGNOSIS — R9431 Abnormal electrocardiogram [ECG] [EKG]: Secondary | ICD-10-CM

## 2020-05-26 DIAGNOSIS — I5032 Chronic diastolic (congestive) heart failure: Secondary | ICD-10-CM

## 2020-05-26 DIAGNOSIS — I4891 Unspecified atrial fibrillation: Secondary | ICD-10-CM | POA: Diagnosis not present

## 2020-05-26 DIAGNOSIS — I10 Essential (primary) hypertension: Secondary | ICD-10-CM

## 2020-05-26 LAB — POCT INR: INR: 2 (ref 2.0–3.0)

## 2020-05-26 NOTE — Patient Instructions (Signed)
Medication Instructions:  No changes. *If you need a refill on your cardiac medications before your next appointment, please call your pharmacy*   Lab Work: None ordered If you have labs (blood work) drawn today and your tests are completely normal, you will receive your results only by: Marland Kitchen MyChart Message (if you have MyChart) OR . A paper copy in the mail If you have any lab test that is abnormal or we need to change your treatment, we will call you to review the results.   Testing/Procedures: None ordered   Follow-Up: At [facility], you and your health needs are our priority.  As part of our continuing mission to provide you with exceptional heart care, we have created designated Provider Care Teams.  These Care Teams include your primary Cardiologist (physician) and Advanced Practice Providers (APPs -  Physician Assistants and Nurse Practitioners) who all work together to provide you with the care you need, when you need it.  We recommend signing up for the patient portal called "MyChart".  Sign up information is provided on this After Visit Summary.  MyChart is used to connect with patients for Virtual Visits (Telemedicine).  Patients are able to view lab/test results, encounter notes, upcoming appointments, etc.  Non-urgent messages can be sent to your provider as well.   To learn more about what you can do with MyChart, go to [email or web address].    Your next appointment:   12 month(s)  The format for your next appointment:   In Person  Provider:   Sanda Klein, MD   Other Instructions

## 2020-05-26 NOTE — Progress Notes (Signed)
Patient ID: [name], male   DOB: 21-Oct-1934, 86 y.o.   MRN: [id number]    Cardiology Office Note    Date:  05/29/2020   ID:  [name], DOB 1934-03-14, MRN [id number]  PCP:  Sharilyn Sites, MD  Cardiologist:   Sanda Klein, MD   Chief Complaint  Patient presents with  . Atrial Fibrillation    History of Present Illness:  [name] is a 86 y.o. male with longstanding persistent atrial fibrillation (history of pacemaker implanted for tachycardia-bradycardia syndrome) who returns for pacemaker check.   His wife [name] passed away on 05/30/2023.  They were married for 60 years.  He is clearly grieving, but has a lot of support.  He still living in their old home (where he has lived for over 50 years), by himself.  His children help a lot.  He does not have cardiovascular complaints.  He has occasional ankle swelling and will take furosemide twice sometimes 3 times a week, but he denies dyspnea at rest or with exertion, orthopnea, PND, chest pain at rest or with activity, dizziness, palpitations, syncope, intermittent claudication or focal neurological complaints.  He has not had any falls, injuries or bleeding and is compliant with warfarin anticoagulation and monitoring.  His dual-chamber device is now programmed VVIR for what is felt to be permanent atrial fibrillation.  He has about 94% ventricular pacing but is not pacemaker dependent.  The underlying rhythm is atrial fibrillation with slow ventricular response at about 40-45 bpm.  The heart rate histogram distribution appears appropriate.  He has not had any episodes of high ventricular rate.  Estimated generator longevity is about 10 years (Medtronic Adapta dual-chamber implanted 2015).  He has a history of recurrent persistent atrial fibrillation, hypertension, GERD, diabetes, dual chamber Medtronic pacemaker for SSS. On 07/27/2014 the patient underwent generator change out.EF has usually been 50-55%, but dropped to 40-45%  when he had broad QRS/flecainide toxicity. He did not have ischemia by his most recent nuclear stress test, EF normalized off flecainide. In 2007, cardiac catheterization showed a 40% proximal LAD stenosis, no other significant lesions. He is on chronic warfarin anticoagulation and has not had bleeding complications or stroke. In the past has had successful cardioversion as well as pace-termination of atrial flutter via his pacemaker.  Past Medical History:  Diagnosis Date  . Arthritis   . Atrial fibrillation ([place])   . Barrett's esophagus   . CAD (coronary artery disease)    echo 09-05-2010-EF nl, mod-severe dilated Left atrium; myoview 02-08-12-EF 52%, no ischemia  . Cancer [facility])    prostate - radiation only  . Diabetes mellitus without complication ([place])    "prediabetic"-no oral meds  . GERD (gastroesophageal reflux disease)   . Gout   . H/O transesophageal echocardiography (TEE) for monitoring 09/05/10   tee guided AT pace termination, did not proceed with cardioversion due to termination of the atrial flutter through his pacemaker  . History of cardioversion 02/09/13   electrical synchronized cardioversion, on flecainide and warfarin  . History of kidney stones    per pt - no actual diagnosisi  . History of stomach ulcers    many yrs ago  . HTN (hypertension)   . Pacemaker    medtronic  . Pacemaker 09-25-13  . Prostate cancer ([place]) 09-25-13   tx. with radiation only- dx. 2013    Past Surgical History:  Procedure Laterality Date  . APPENDECTOMY    . CARDIAC CATHETERIZATION  02/18/06  EF 65%, focal napkin ring-like lesion into the prox LAD otherwise normal coronaries  . CARDIOVERSION N/A 02/09/2013   Procedure: CARDIOVERSION;  Surgeon: Sanda Klein, MD;  Location: [facility]OS[facility];  Service: Cardiovascular;  Laterality: N/A;  . PACEMAKER GENERATOR CHANGE N/A 07/27/2014   Procedure: PACEMAKER GENERATOR CHANGE;  Surgeon: Sanda Klein, MD;  Location: [facility] CATH LAB;  Service:  Cardiovascular;  Laterality: N/A;  . PACEMAKER INSERTION     medtronic adapta- Dr. Sallyanne Kuster follows- LOV 9'14  . TONSILLECTOMY    . TOTAL KNEE ARTHROPLASTY Right 10/05/2013   Procedure: RIGHT TOTAL KNEE ARTHROPLASTY;  Surgeon: Gearlean Alf, MD;  Location: [facility];  Service: Orthopedics;  Laterality: Right;    Outpatient Medications Prior to Visit  Medication Sig Dispense Refill  . acetaminophen (TYLENOL) 650 MG CR tablet Take 650 mg by mouth every 8 (eight) hours as needed for pain.     . furosemide (LASIX) 20 MG tablet Take 1 tablet (20 mg total) by mouth as needed for fluid or edema. Pt must make appt to get further refills 90 tablet 0  . metoprolol succinate (TOPROL-XL) 50 MG 24 hr tablet TAKE 1 TABLET (50 MG TOTAL) BY MOUTH DAILY. TAKE WITH OR IMMEDIATELY FOLLOWING A MEAL. 90 tablet 0  . Misc Natural Products (TART CHERRY ADVANCED PO) Take 1,200 mg by mouth daily.     . Omega-3 Fatty Acids (OMEGA 3 PO) Take 1 capsule by mouth daily.     . Potassium 99 MG TABS Take 1 tablet by mouth as needed.     . warfarin (COUMADIN) 2.5 MG tablet TAKE 1/2 TO 1 TABLET BY MOUTH DAILY AS DIRECTED BY COUMADIN CLINIC 90 tablet 1   No facility-administered medications prior to visit.     Allergies:   Shellfish-derived products and Tape   Social History   Socioeconomic History  . Marital status: Married    Spouse name: Not on file  . Number of children: Not on file  . Years of education: Not on file  . Highest education level: Not on file  Occupational History  . Not on file  Tobacco Use  . Smoking status: Former Smoker    Quit date: 07/22/1970    Years since quitting: 49.8  . Smokeless tobacco: Never Used  . Tobacco comment: Quit in the 11s  Substance and Sexual Activity  . Alcohol use: No  . Drug use: No  . Sexual activity: Not Currently  Other Topics Concern  . Not on file  Social History Narrative  . Not on file   Social Determinants of Health   Financial Resource Strain:   .  Difficulty of Paying Living Expenses:   Food Insecurity:   . Worried About Charity fundraiser in the Last Year:   . Arboriculturist in the Last Year:   Transportation Needs:   . Film/video editor (Medical):   Marland Kitchen Lack of Transportation (Non-Medical):   Physical Activity:   . Days of Exercise per Week:   . Minutes of Exercise per Session:   Stress:   . Feeling of Stress :   Social Connections:   . Frequency of Communication with Friends and Family:   . Frequency of Social Gatherings with Friends and Family:   . Attends Religious Services:   . Active Member of Clubs or Organizations:   . Attends Archivist Meetings:   Marland Kitchen Marital Status:      Family History:  The patient's family history includes Diabetes in  his brother and mother.   ROS:   Please see the history of present illness.    ROS All other systems are reviewed and are negative.  PHYSICAL EXAM:   VS:  BP (!) 142/82   Pulse 74   Ht 5' 6.5" (1.689 m)   Wt 191 lb (86.6 kg)   SpO2 98%   BMI 30.37 kg/m     General: Alert, oriented x3, no distress, mildly obese.  Healthy left subclavian pacemaker site. Head: no evidence of trauma, PERRL, EOMI, no exophtalmos or lid lag, no myxedema, no xanthelasma; normal ears, Oconnor and oropharynx Neck: normal jugular venous pulsations and no hepatojugular reflux; brisk carotid pulses without delay and no carotid bruits Chest: clear to auscultation, no signs of consolidation by percussion or palpation, normal fremitus, symmetrical and full respiratory excursions Cardiovascular: normal position and quality of the apical impulse, regular rhythm, normal first and second heart sounds, no murmurs, rubs or gallops Abdomen: no tenderness or distention, no masses by palpation, no abnormal pulsatility or arterial bruits, normal bowel sounds, no hepatosplenomegaly Extremities: no clubbing, cyanosis or edema; 2+ radial, ulnar and brachial pulses bilaterally; 2+ right femoral, posterior  tibial and dorsalis pedis pulses; 2+ left femoral, posterior tibial and dorsalis pedis pulses; no subclavian or femoral bruits Neurological: grossly nonfocal Psych: Normal mood and affect   Wt Readings from Last 3 Encounters:  05/26/20 191 lb (86.6 kg)  01/08/18 193 lb 6.4 oz (87.7 kg)  10/06/17 180 lb (81.6 kg)      Studies/Labs Reviewed:   EKG: Ordered today, shows atrial fibrillation with 100% ventricular paced rhythm.  QTC is prolonged at 528 ms  Recent Labs: September 24, 2017 Total cholesterol 203, HDL 45, LDL 132, triglycerides 129 Hemoglobin A1c 5.4%, hemoglobin 13.7, creatinine 0.51, potassium 3.5, TSH 2.53  Recent labs are not available for review  ASSESSMENT:    1. Permanent atrial fibrillation ([place])   2. Pacemaker   3. Long QT interval   4. Essential hypertension   5. Long term current use of anticoagulant therapy   6. Chronic diastolic heart failure ([place])   7. Grief      PLAN:  In order of problems listed above:  1. AFib: Treating this as a permanent arrhythmia now.  Rate control is good, may be even excessive.  It might be beneficial to cut back on his beta-blocker dose to reduce ventricular pacing.  However ventricular pacing does not appear to be causing any heart failure. Antiarrhythmics are not justified. CHADSVasc score is high, at least 4 (age 33, CHF, HTN), maybe 6 if one counts CAD and borderline diabetes.  Continue anticoagulation. 2. PPM: Dual-chamber device programmed VVIR.  Continue remote downloads every 3 months. 3. Long QT: Partly related to ventricular paced rhythm but predates this; avoid QT prolonging agents 4. HTN: Blood pressure is little high but he is grieving.  Decided to hold off changing beta-blockers until things settle down. 5. Warfarin: Well-tolerated.  Almost always in therapeutic range.  No bleeding complications. 6. CHF: Only requires intermittent loop diuretics.  Appears clinically euvolemic today. 7. Grief reaction: Seems to be  handling it fairly well.    Medication Adjustments/Labs and Tests Ordered: Current medicines are reviewed at length with the patient today.  Concerns regarding medicines are outlined above.  Medication changes, Labs and Tests ordered today are listed in the Patient Instructions below. Patient Instructions  Medication Instructions:  No changes. *If you need a refill on your cardiac medications before your next appointment,  please call your pharmacy*   Lab Work: None ordered If you have labs (blood work) drawn today and your tests are completely normal, you will receive your results only by: Marland Kitchen MyChart Message (if you have MyChart) OR . A paper copy in the mail If you have any lab test that is abnormal or we need to change your treatment, we will call you to review the results.   Testing/Procedures: None ordered   Follow-Up: At [facility], you and your health needs are our priority.  As part of our continuing mission to provide you with exceptional heart care, we have created designated Provider Care Teams.  These Care Teams include your primary Cardiologist (physician) and Advanced Practice Providers (APPs -  Physician Assistants and Nurse Practitioners) who all work together to provide you with the care you need, when you need it.  We recommend signing up for the patient portal called "MyChart".  Sign up information is provided on this After Visit Summary.  MyChart is used to connect with patients for Virtual Visits (Telemedicine).  Patients are able to view lab/test results, encounter notes, upcoming appointments, etc.  Non-urgent messages can be sent to your provider as well.   To learn more about what you can do with MyChart, go to [email or web address].    Your next appointment:   12 month(s)  The format for your next appointment:   In Person  Provider:   Sanda Klein, MD   Other Instructions       Signed, Sanda Klein, MD  05/29/2020 5:20 PM    [facility] [address], [place], [place]  [address] Phone: [phone]; Fax: [phone]

## 2020-05-29 ENCOUNTER — Encounter: Payer: Self-pay | Admitting: Cardiovascular Disease

## 2020-07-06 ENCOUNTER — Other Ambulatory Visit: Payer: Self-pay

## 2020-07-06 ENCOUNTER — Ambulatory Visit (INDEPENDENT_AMBULATORY_CARE_PROVIDER_SITE_OTHER): Payer: Medicare Other

## 2020-07-06 DIAGNOSIS — Z7901 Long term (current) use of anticoagulants: Secondary | ICD-10-CM

## 2020-07-06 DIAGNOSIS — I4891 Unspecified atrial fibrillation: Secondary | ICD-10-CM

## 2020-07-06 LAB — POCT INR: INR: 2 (ref 2.0–3.0)

## 2020-07-06 NOTE — Patient Instructions (Addendum)
Continue taking 1/2 pill everyday except 1 pill each Monday, Wednesday and Friday.  Repeat INR in 5 weeks.

## 2020-08-04 ENCOUNTER — Ambulatory Visit (INDEPENDENT_AMBULATORY_CARE_PROVIDER_SITE_OTHER): Payer: Medicare Other

## 2020-08-04 DIAGNOSIS — I495 Sick sinus syndrome: Secondary | ICD-10-CM | POA: Diagnosis not present

## 2020-08-06 LAB — CUP PACEART REMOTE DEVICE CHECK
Battery Impedance: 408 Ohm
Battery Remaining Longevity: 115 mo
Battery Voltage: 2.78 V
Brady Statistic RV Percent Paced: 97 %
Date Time Interrogation Session: 20210930104928
Implantable Lead Implant Date: 20070417
Implantable Lead Implant Date: 20070417
Implantable Lead Location: 753859
Implantable Lead Location: 753860
Implantable Lead Model: 5092
Implantable Lead Model: 5594
Implantable Pulse Generator Implant Date: 20150922
Lead Channel Impedance Value: 67 Ohm
Lead Channel Impedance Value: 711 Ohm
Lead Channel Pacing Threshold Amplitude: 1 V
Lead Channel Pacing Threshold Pulse Width: 0.4 ms
Lead Channel Setting Pacing Amplitude: 2 V
Lead Channel Setting Pacing Pulse Width: 0.4 ms
Lead Channel Setting Sensing Sensitivity: 5.6 mV

## 2020-08-08 NOTE — Progress Notes (Signed)
Remote pacemaker transmission.   

## 2020-08-10 ENCOUNTER — Ambulatory Visit (INDEPENDENT_AMBULATORY_CARE_PROVIDER_SITE_OTHER): Payer: Medicare Other | Admitting: *Deleted

## 2020-08-10 DIAGNOSIS — Z7901 Long term (current) use of anticoagulants: Secondary | ICD-10-CM | POA: Diagnosis not present

## 2020-08-10 DIAGNOSIS — I4891 Unspecified atrial fibrillation: Secondary | ICD-10-CM | POA: Diagnosis not present

## 2020-08-10 LAB — POCT INR: INR: 3 (ref 2.0–3.0)

## 2020-08-10 NOTE — Patient Instructions (Signed)
Continue taking 1/2 pill everyday except 1 pill each Monday, Wednesday and Friday.  Repeat INR in 5 weeks.

## 2020-08-24 ENCOUNTER — Other Ambulatory Visit: Payer: Self-pay | Admitting: Cardiovascular Disease

## 2020-08-24 MED ORDER — FUROSEMIDE 20 MG PO TABS: 20.0000 mg | ORAL_TABLET | ORAL | 3 refills | Status: DC | PRN

## 2020-08-24 MED ORDER — METOPROLOL SUCCINATE ER 50 MG PO TB24: 50.0000 mg | ORAL_TABLET | Freq: Every day | ORAL | 3 refills | Status: DC

## 2020-08-24 NOTE — Telephone Encounter (Signed)
*  STAT* If patient is at the pharmacy, call can be transferred to refill team.   1. Which medications need to be refilled? (please list name of each medication and dose if known)  metoprolol succinate (TOPROL-XL) 50 MG 24 hr tablet furosemide (LASIX) 20 MG tablet 2. Which pharmacy/location (including street and city if local pharmacy) is medication to be sent to? [id number] - [place], [place] [address]  3. Do they need a 30 day or 90 day supply? 90 day supply

## 2020-09-14 ENCOUNTER — Ambulatory Visit (INDEPENDENT_AMBULATORY_CARE_PROVIDER_SITE_OTHER): Payer: Medicare Other | Admitting: *Deleted

## 2020-09-14 ENCOUNTER — Other Ambulatory Visit: Payer: Self-pay

## 2020-09-14 DIAGNOSIS — Z7901 Long term (current) use of anticoagulants: Secondary | ICD-10-CM

## 2020-09-14 DIAGNOSIS — I4891 Unspecified atrial fibrillation: Secondary | ICD-10-CM | POA: Diagnosis not present

## 2020-09-14 LAB — POCT INR: INR: 2.8 (ref 2.0–3.0)

## 2020-09-14 NOTE — Patient Instructions (Signed)
Continue taking 1/2 pill everyday except 1 pill each Monday, Wednesday and Friday.  Repeat INR in 6 weeks.

## 2020-09-28 ENCOUNTER — Other Ambulatory Visit: Payer: Self-pay | Admitting: Cardiovascular Disease

## 2020-10-03 ENCOUNTER — Other Ambulatory Visit: Payer: Self-pay

## 2020-10-03 MED ORDER — WARFARIN SODIUM 2.5 MG PO TABS: ORAL_TABLET | ORAL | 1 refills | Status: DC

## 2020-10-26 ENCOUNTER — Ambulatory Visit (INDEPENDENT_AMBULATORY_CARE_PROVIDER_SITE_OTHER): Payer: Medicare Other | Admitting: *Deleted

## 2020-10-26 DIAGNOSIS — Z7901 Long term (current) use of anticoagulants: Secondary | ICD-10-CM | POA: Diagnosis not present

## 2020-10-26 DIAGNOSIS — I4891 Unspecified atrial fibrillation: Secondary | ICD-10-CM | POA: Diagnosis not present

## 2020-10-26 LAB — POCT INR: INR: 1.9 — AB (ref 2.0–3.0)

## 2020-10-26 NOTE — Patient Instructions (Signed)
Continue taking 1/2 pill everyday except 1 pill each Monday, Wednesday and Friday.  Repeat INR in 6 weeks. 

## 2020-11-03 ENCOUNTER — Telehealth: Payer: Self-pay | Admitting: Cardiovascular Disease

## 2020-11-03 NOTE — Telephone Encounter (Signed)
  Patient wanted to let Dr. Salena Saner know that his machine that sends the remote transmissions is not working. He has reached out to the [employer] and will be getting a new machine. Please let the patient know if there is anything else he needs to do.

## 2020-11-03 NOTE — Telephone Encounter (Signed)
Patient called and will notify device clinic when he receives new hand held piece to monitor from Medtronic. He was instructed to call the device clinic if he has any issues concerning the pacemaker before he gets his new monitor equipment and we will bring him into the office to check the device if needed.

## 2020-11-09 ENCOUNTER — Ambulatory Visit (INDEPENDENT_AMBULATORY_CARE_PROVIDER_SITE_OTHER): Payer: Medicare Other

## 2020-11-09 DIAGNOSIS — I495 Sick sinus syndrome: Secondary | ICD-10-CM | POA: Diagnosis not present

## 2020-11-09 LAB — CUP PACEART REMOTE DEVICE CHECK
Battery Impedance: 383 Ohm
Battery Remaining Longevity: 118 mo
Battery Voltage: 2.79 V
Brady Statistic RV Percent Paced: 97 %
Date Time Interrogation Session: 20220105092712
Implantable Lead Implant Date: 20070417
Implantable Lead Implant Date: 20070417
Implantable Lead Location: 753859
Implantable Lead Location: 753860
Implantable Lead Model: 5092
Implantable Lead Model: 5594
Implantable Pulse Generator Implant Date: 20150922
Lead Channel Impedance Value: 67 Ohm
Lead Channel Impedance Value: 713 Ohm
Lead Channel Pacing Threshold Amplitude: 0.875 V
Lead Channel Pacing Threshold Pulse Width: 0.4 ms
Lead Channel Setting Pacing Amplitude: 2 V
Lead Channel Setting Pacing Pulse Width: 0.4 ms
Lead Channel Setting Sensing Sensitivity: 5.6 mV

## 2020-11-23 NOTE — Progress Notes (Signed)
Remote pacemaker transmission.   

## 2020-11-29 NOTE — Telephone Encounter (Signed)
Patient reports he received monitor. Transmission was received 11/09/2020. Advised patient to call with further questions or concerns. Verbalized understanding.

## 2020-11-30 NOTE — H&P (Addendum)
Surgical History & Physical  Patient Name: [name] DOB: [birth year]  Surgery: Cataract extraction with intraocular lens implant phacoemulsification; Left Eye  Surgeon: [name] MD Surgery Date:  12/23/2020 Pre-Op Date:  12/06/2020  HPI: A 5 Yr. old male patient 1. 1. The patient complains of difficulty when driving at night, which began 3 ago. Both eyes are affected. The episode is gradual. The condition's severity increased since last visit. Symptoms occur when the patient is driving, outside and reading. This is negatively affecting the patient's quality of life. is referred by Dr Jorja Loa for cataract eval. Pt is not using eye drops.  Medical History: Cataracts Diabetes Gout Heart Problem  Review of Systems Negative Allergic/Immunologic Negative Cardiovascular Negative Constitutional Negative Ear, Nose, Mouth & Throat Negative Endocrine Negative Eyes Negative Gastrointestinal Negative Genitourinary Negative Hemotologic/Lymphatic Negative Integumentary Negative Musculoskeletal Negative Neurological Negative Psychiatry Negative Respiratory  Social   Never smoked   Medication Allopurinol, Metoprolol, Pantoprazole, Warfarin,   Sx/Procedures Appendectomy, Knee Replacement, Pacemaker,   Drug Allergies   NKDA  History & Physical: Heent:  Cataract, Left eye NECK: supple without bruits LUNGS: lungs clear to auscultation CV: regular rate and rhythm Abdomen: soft and non-tender  Impression & Plan: Assessment: 1.  NUCLEAR SCLEROSIS AGE RELATED; Both Eyes (H25.13) 2.  VITREOUS DETACHMENT PVD; Both Eyes (H43.813) 3.  BLEPHARITIS; Right Upper Lid, Right Lower Lid, Left Upper Lid, Left Lower Lid (H01.001, H01.002,H01.004,H01.005) 4.  CONJUNCTIVOCHALASIS; Both Eyes (H11.823) 5.  DERMATOCHALASIS, no surgery; Right Upper Lid, Left Upper Lid (H02.831, H02.834) 6.  Diabetes Type 2 No retinopathy (E11.9)  Plan: 1.  Cataract accounts for the patient's decreased  vision. This visual impairment is not correctable with a tolerable change in glasses or contact lenses. Cataract surgery with an implantation of a new lens should significantly improve the visual and functional status of the patient. Discussed all risks, benefits, alternatives, and potential complications. Discussed the procedures and recovery. Patient desires to have surgery. A-scan ordered and performed today for intra-ocular lens calculations. The surgery will be performed in order to improve vision for driving, reading, and for eye examinations. Recommend phacoemulsification with intra-ocular lens. Recommend Dextenza for post-operative pain and inflammation. Left Eye non-dominant - first. Dilates well - shugarcaine by protocol. 2.  Old Asymptomatic. RD precautions given. Patient to call with increase in flashing lights/floaters/dark curtain. Symptomatic. 3.  Regular lid cleaning. 4.  Asymptomatic. Symptomatic. 5.  Asymptomatic, recommend observation for now. Findings, prognosis and treatment options reviewed. 6.  Stressed importance of blood sugar and blood pressure control, and also yearly eye examinations. Discussed the need for ongoing proactive ocular exams and treatment, hopefully before visual symptoms develop.

## 2020-12-05 ENCOUNTER — Encounter (HOSPITAL_COMMUNITY): Admission: RE | Admit: 2020-12-05 | Payer: Medicare Other | Source: Ambulatory Visit

## 2020-12-05 ENCOUNTER — Other Ambulatory Visit: Payer: Self-pay

## 2020-12-07 ENCOUNTER — Other Ambulatory Visit (HOSPITAL_COMMUNITY)
Admission: RE | Admit: 2020-12-07 | Discharge: 2020-12-07 | Disposition: A | Discharge status: Home / Self Care | Payer: Medicare Other | Source: Ambulatory Visit | Attending: Ophthalmology | Admitting: Ophthalmology

## 2020-12-07 ENCOUNTER — Ambulatory Visit (INDEPENDENT_AMBULATORY_CARE_PROVIDER_SITE_OTHER): Payer: Medicare Other | Admitting: *Deleted

## 2020-12-07 DIAGNOSIS — Z7901 Long term (current) use of anticoagulants: Secondary | ICD-10-CM | POA: Diagnosis not present

## 2020-12-07 DIAGNOSIS — I4891 Unspecified atrial fibrillation: Secondary | ICD-10-CM | POA: Diagnosis not present

## 2020-12-07 LAB — POCT INR: INR: 2.8 (ref 2.0–3.0)

## 2020-12-07 NOTE — Patient Instructions (Signed)
Continue taking 1/2 pill everyday except 1 pill each Monday, Wednesday and Friday.  Repeat INR in 6 weeks. 

## 2020-12-19 ENCOUNTER — Other Ambulatory Visit: Payer: Self-pay

## 2020-12-19 ENCOUNTER — Encounter (HOSPITAL_COMMUNITY)
Admission: RE | Admit: 2020-12-19 | Discharge: 2020-12-19 | Disposition: A | Discharge status: Home / Self Care | Payer: Medicare Other | Source: Ambulatory Visit | Attending: Ophthalmology | Admitting: Ophthalmology

## 2020-12-21 ENCOUNTER — Other Ambulatory Visit (HOSPITAL_COMMUNITY)
Admission: RE | Admit: 2020-12-21 | Discharge: 2020-12-21 | Disposition: A | Discharge status: Home / Self Care | Payer: Medicare Other | Source: Ambulatory Visit | Attending: Ophthalmology | Admitting: Ophthalmology

## 2020-12-21 ENCOUNTER — Other Ambulatory Visit: Payer: Self-pay

## 2020-12-21 DIAGNOSIS — Z01812 Encounter for preprocedural laboratory examination: Secondary | ICD-10-CM | POA: Insufficient documentation

## 2020-12-21 DIAGNOSIS — Z20822 Contact with and (suspected) exposure to covid-19: Secondary | ICD-10-CM | POA: Insufficient documentation

## 2020-12-21 LAB — SARS CORONAVIRUS 2 (TAT 6-24 HRS): SARS Coronavirus 2: NEGATIVE

## 2020-12-21 MED ORDER — CYCLOPENTOLATE-PHENYLEPHRINE 0.2-1 % OP SOLN: 1.0000 [drp] | OPHTHALMIC | Status: DC | PRN

## 2020-12-23 ENCOUNTER — Ambulatory Visit (HOSPITAL_COMMUNITY): Payer: Medicare Other | Admitting: Anesthesiology

## 2020-12-23 ENCOUNTER — Encounter (HOSPITAL_COMMUNITY)
Admission: RE | Disposition: A | Discharge status: Home / Self Care | Payer: Self-pay | Source: Home / Self Care | Attending: Ophthalmology

## 2020-12-23 ENCOUNTER — Other Ambulatory Visit: Payer: Self-pay

## 2020-12-23 ENCOUNTER — Ambulatory Visit (HOSPITAL_COMMUNITY)
Admission: RE | Admit: 2020-12-23 | Discharge: 2020-12-23 | Disposition: A | Discharge status: Home / Self Care | Payer: Medicare Other | Attending: Ophthalmology | Admitting: Ophthalmology

## 2020-12-23 ENCOUNTER — Encounter (HOSPITAL_COMMUNITY): Payer: Self-pay | Admitting: Ophthalmology

## 2020-12-23 DIAGNOSIS — E1151 Type 2 diabetes mellitus with diabetic peripheral angiopathy without gangrene: Secondary | ICD-10-CM | POA: Diagnosis not present

## 2020-12-23 DIAGNOSIS — H11823 Conjunctivochalasis, bilateral: Secondary | ICD-10-CM | POA: Diagnosis not present

## 2020-12-23 DIAGNOSIS — Z79899 Other long term (current) drug therapy: Secondary | ICD-10-CM | POA: Diagnosis not present

## 2020-12-23 DIAGNOSIS — Z950 Presence of cardiac pacemaker: Secondary | ICD-10-CM | POA: Insufficient documentation

## 2020-12-23 DIAGNOSIS — M109 Gout, unspecified: Secondary | ICD-10-CM | POA: Diagnosis not present

## 2020-12-23 DIAGNOSIS — H43813 Vitreous degeneration, bilateral: Secondary | ICD-10-CM | POA: Diagnosis not present

## 2020-12-23 DIAGNOSIS — H0100A Unspecified blepharitis right eye, upper and lower eyelids: Secondary | ICD-10-CM | POA: Insufficient documentation

## 2020-12-23 DIAGNOSIS — Z7901 Long term (current) use of anticoagulants: Secondary | ICD-10-CM | POA: Diagnosis not present

## 2020-12-23 DIAGNOSIS — E1136 Type 2 diabetes mellitus with diabetic cataract: Secondary | ICD-10-CM | POA: Insufficient documentation

## 2020-12-23 DIAGNOSIS — H2512 Age-related nuclear cataract, left eye: Secondary | ICD-10-CM | POA: Insufficient documentation

## 2020-12-23 DIAGNOSIS — H0100B Unspecified blepharitis left eye, upper and lower eyelids: Secondary | ICD-10-CM | POA: Diagnosis not present

## 2020-12-23 HISTORY — PX: CATARACT EXTRACTION W/PHACO: SHX586

## 2020-12-23 SURGERY — PHACOEMULSIFICATION, CATARACT, WITH IOL INSERTION
Anesthesia: Monitor Anesthesia Care | Site: Eye | Laterality: Left

## 2020-12-23 MED ORDER — PHENYLEPHRINE-KETOROLAC 1-0.3 % IO SOLN
INTRAOCULAR | Status: AC
  Filled 2020-12-23: qty 4

## 2020-12-23 MED ORDER — NEOMYCIN-POLYMYXIN-DEXAMETH 3.5-10000-0.1 OP SUSP
OPHTHALMIC | Status: DC | PRN
  Administered 2020-12-23: 1 [drp] via OPHTHALMIC

## 2020-12-23 MED ORDER — SODIUM HYALURONATE 23 MG/ML IO SOLN
INTRAOCULAR | Status: DC | PRN
  Administered 2020-12-23: 0.6 mL via INTRAOCULAR

## 2020-12-23 MED ORDER — TETRACAINE HCL 0.5 % OP SOLN
1.0000 [drp] | OPHTHALMIC | Status: AC | PRN
  Administered 2020-12-23 (×3): 1 [drp] via OPHTHALMIC

## 2020-12-23 MED ORDER — PROVISC 10 MG/ML IO SOLN
INTRAOCULAR | Status: DC | PRN
  Administered 2020-12-23: 0.85 mL via INTRAOCULAR

## 2020-12-23 MED ORDER — LIDOCAINE HCL 3.5 % OP GEL
1.0000 "application " | Freq: Once | OPHTHALMIC | Status: AC
  Administered 2020-12-23: 1 via OPHTHALMIC

## 2020-12-23 MED ORDER — TROPICAMIDE 1 % OP SOLN
1.0000 [drp] | OPHTHALMIC | Status: AC
  Administered 2020-12-23 (×3): 1 [drp] via OPHTHALMIC

## 2020-12-23 MED ORDER — PHENYLEPHRINE-KETOROLAC 1-0.3 % IO SOLN
INTRAOCULAR | Status: DC | PRN
  Administered 2020-12-23: 500 mL via OPHTHALMIC

## 2020-12-23 MED ORDER — STERILE WATER FOR IRRIGATION IR SOLN
Status: DC | PRN
  Administered 2020-12-23: 250 mL

## 2020-12-23 MED ORDER — LIDOCAINE HCL (PF) 1 % IJ SOLN
INTRAOCULAR | Status: DC | PRN
  Administered 2020-12-23: 1 mL via OPHTHALMIC

## 2020-12-23 MED ORDER — PHENYLEPHRINE HCL 2.5 % OP SOLN
1.0000 [drp] | OPHTHALMIC | Status: AC | PRN
  Administered 2020-12-23 (×3): 1 [drp] via OPHTHALMIC

## 2020-12-23 MED ORDER — BSS IO SOLN
INTRAOCULAR | Status: DC | PRN
  Administered 2020-12-23: 15 mL via INTRAOCULAR

## 2020-12-23 MED ORDER — POVIDONE-IODINE 5 % OP SOLN
OPHTHALMIC | Status: DC | PRN
  Administered 2020-12-23: 1 via OPHTHALMIC

## 2020-12-23 SURGICAL SUPPLY — 16 items
CLOTH BEACON ORANGE TIMEOUT ST (SAFETY) ×1 IMPLANT
DEVICE MILOOP (MISCELLANEOUS) IMPLANT
EYE SHIELD UNIVERSAL CLEAR (GAUZE/BANDAGES/DRESSINGS) ×1 IMPLANT
GLOVE SURG UNDER POLY LF SZ6.5 (GLOVE) ×1 IMPLANT
GLOVE SURG UNDER POLY LF SZ7 (GLOVE) ×1 IMPLANT
MILOOP DEVICE (MISCELLANEOUS)
NDL HYPO 18GX1.5 BLUNT FILL (NEEDLE) IMPLANT
NEEDLE HYPO 18GX1.5 BLUNT FILL (NEEDLE) ×2 IMPLANT
PAD ARMBOARD 7.5X6 YLW CONV (MISCELLANEOUS) ×1 IMPLANT
RING MALYGIN (MISCELLANEOUS) IMPLANT
RING MALYGIN 7.0 (MISCELLANEOUS) IMPLANT
SYR TB 1ML LL NO SAFETY (SYRINGE) ×1 IMPLANT
TAPE PAPER 1X10 WHT MICROPORE (GAUZE/BANDAGES/DRESSINGS) ×1 IMPLANT
TECNIS 1-PIECE INTRAOCULAR LENS ×1 IMPLANT
VISCOELASTIC ADDITIONAL (OPHTHALMIC RELATED) IMPLANT
WATER STERILE IRR 250ML POUR (IV SOLUTION) ×1 IMPLANT

## 2020-12-23 NOTE — Discharge Instructions (Addendum)
Please discharge patient when stable, will follow up today with Dr. Wrzosek at the [facility] office immediately following discharge.  Leave shield in place until visit.  All paperwork with discharge instructions will be given at the office.  [facility] Address:  [address]  [place], [place] [address]  

## 2020-12-23 NOTE — Anesthesia Postprocedure Evaluation (Signed)
Anesthesia Post Note  Patient: [name]  Procedure(s) Performed: CATARACT EXTRACTION PHACO AND INTRAOCULAR LENS PLACEMENT (IOC) (Left Eye)  Patient location during evaluation: Phase II Anesthesia Type: MAC Level of consciousness: oriented and awake and alert Pain management: pain level controlled Vital Signs Assessment: post-procedure vital signs reviewed and stable Respiratory status: spontaneous breathing, nonlabored ventilation and respiratory function stable Cardiovascular status: blood pressure returned to baseline and stable Postop Assessment: no apparent nausea or vomiting Anesthetic complications: no   No complications documented.   Last Vitals:  Vitals:   12/23/20 1101 12/23/20 1128  BP: (!) 177/84 (!) 178/87  Pulse: 65   Resp: 16   Temp: 36.6 C 36.6 C  SpO2: 100% 99%    Last Pain:  Vitals:   12/23/20 1128  TempSrc:   PainSc: 0-No pain                 Orlie Dakin

## 2020-12-23 NOTE — Interval H&P Note (Signed)
History and Physical Interval Note:  12/23/2020 10:57 AM  [name]  has presented today for surgery, with the diagnosis of Nuclear sclerotic cataract - Left eye.  The various methods of treatment have been discussed with the patient and family. After consideration of risks, benefits and other options for treatment, the patient has consented to  Procedure(s) with comments: CATARACT EXTRACTION PHACO AND INTRAOCULAR LENS PLACEMENT ([place]) (Left) - left as a surgical intervention.  The patient's history has been reviewed, patient examined, no change in status, stable for surgery.  I have reviewed the patient's chart and labs.  Questions were answered to the patient's satisfaction.     [name]

## 2020-12-23 NOTE — Transfer of Care (Signed)
Immediate Anesthesia Transfer of Care Note  Patient: [name]  Procedure(s) Performed: CATARACT EXTRACTION PHACO AND INTRAOCULAR LENS PLACEMENT (IOC) (Left Eye)  Patient Location: Short Stay  Anesthesia Type:MAC  Level of Consciousness: awake, alert  and oriented  Airway & Oxygen Therapy: Patient Spontanous Breathing  Post-op Assessment: Report given to RN and Post -op Vital signs reviewed and stable  Post vital signs: Reviewed and stable  Last Vitals:  Vitals Value Taken Time  BP    Temp    Pulse    Resp    SpO2      Last Pain:  Vitals:   12/23/20 1127  TempSrc: Oral  PainSc:          Complications: No complications documented.

## 2020-12-23 NOTE — Anesthesia Procedure Notes (Signed)
Procedure Name: MAC Date/Time: 12/23/2020 11:10 AM Performed by: Orlie Dakin, CRNA Patient Re-evaluated:Patient Re-evaluated prior to induction Oxygen Delivery Method: Nasal cannula Placement Confirmation: positive ETCO2

## 2020-12-23 NOTE — Anesthesia Preprocedure Evaluation (Signed)
Anesthesia Evaluation  Patient identified by MRN, date of birth, ID band Patient awake    Reviewed: Allergy & Precautions, NPO status , Patient's Chart, lab work & pertinent test results, reviewed documented beta blocker date and time   Airway Mallampati: II  TM Distance: >3 FB Neck ROM: Full    Dental  (+) Dental Advisory Given No notable dental injury :   Pulmonary pneumonia, resolved, former smoker,    Pulmonary exam normal breath sounds clear to auscultation       Cardiovascular Exercise Tolerance: Good hypertension, Pt. on home beta blockers and Pt. on medications + CAD  Normal cardiovascular exam+ dysrhythmias Atrial Fibrillation + pacemaker  Rhythm:Regular Rate:Normal     Neuro/Psych negative psych ROS   GI/Hepatic Neg liver ROS, GERD  ,  Endo/Other  diabetes, Well Controlled, Type 2  Renal/GU Renal InsufficiencyRenal disease     Musculoskeletal  (+) Arthritis ,   Abdominal   Peds  Hematology  (+) anemia ,   Anesthesia Other Findings   Reproductive/Obstetrics                             Anesthesia Physical Anesthesia Plan  ASA: III  Anesthesia Plan: MAC   Post-op Pain Management:    Induction:   PONV Risk Score and Plan:   Airway Management Planned: Nasal Cannula and Natural Airway  Additional Equipment:   Intra-op Plan:   Post-operative Plan:   Informed Consent: I have reviewed the patients History and Physical, chart, labs and discussed the procedure including the risks, benefits and alternatives for the proposed anesthesia with the patient or authorized representative who has indicated his/her understanding and acceptance.     Dental advisory given  Plan Discussed with: CRNA and Surgeon  Anesthesia Plan Comments:         Anesthesia Quick Evaluation

## 2020-12-23 NOTE — Op Note (Signed)
Date of procedure: 12/23/20  Pre-operative diagnosis: Visually significant age-related nuclear cataract, Left Eye (H25.12)  Post-operative diagnosis: Visually significant age-related nuclear cataract, Left Eye  Procedure: Removal of cataract via phacoemulsification and insertion of intra-ocular lens Wynetta Emery and [place]  +20.5D into the capsular bag of the Left Eye  Attending surgeon: Gerda Diss. Lakrisha Iseman, MD, MA  Anesthesia: MAC, Topical Akten  Complications: None  Estimated Blood Loss: <37m (minimal)  Specimens: None  Implants: As above  Indications:  Visually significant age-related cataract, Left Eye  Procedure:  The patient was seen and identified in the pre-operative area. The operative eye was identified and dilated.  The operative eye was marked.  Topical anesthesia was administered to the operative eye.     The patient was then to the operative suite and placed in the supine position.  A timeout was performed confirming the patient, procedure to be performed, and all other relevant information.   The patient's face was prepped and draped in the usual fashion for intra-ocular surgery.  A lid speculum was placed into the operative eye and the surgical microscope moved into place and focused.  An inferotemporal paracentesis was created using a 20 gauge paracentesis blade.  Shugarcaine was injected into the anterior chamber.  Viscoelastic was injected into the anterior chamber.  A temporal clear-corneal main wound incision was created using a 2.464mmicrokeratome.  A continuous curvilinear capsulorrhexis was initiated using an irrigating cystitome and completed using capsulorrhexis forceps.  Hydrodissection and hydrodeliniation were performed.  Viscoelastic was injected into the anterior chamber.  A phacoemulsification handpiece and a chopper as a second instrument were used to remove the nucleus and epinucleus. The irrigation/aspiration handpiece was used to remove any remaining  cortical material.   The capsular bag was reinflated with viscoelastic, checked, and found to be intact.  The intraocular lens was inserted into the capsular bag.  The irrigation/aspiration handpiece was used to remove any remaining viscoelastic.  The clear corneal wound and paracentesis wounds were then hydrated and checked with Weck-Cels to be watertight.  The lid-speculum and drape was removed, and the patient's face was cleaned with a wet and dry 4x4.  Maxitrol was instilled in the eye before a clear shield was taped over the eye. The patient was taken to the post-operative care unit in good condition, having tolerated the procedure well.  Post-Op Instructions: [facility] and will receive all other orders and instructions.

## 2020-12-24 ENCOUNTER — Other Ambulatory Visit: Payer: Self-pay

## 2020-12-24 ENCOUNTER — Encounter (HOSPITAL_COMMUNITY): Payer: Self-pay

## 2020-12-24 ENCOUNTER — Ambulatory Visit (INDEPENDENT_AMBULATORY_CARE_PROVIDER_SITE_OTHER): Payer: Medicare Other

## 2020-12-24 ENCOUNTER — Encounter: Payer: Self-pay | Admitting: Emergency Medicine

## 2020-12-24 ENCOUNTER — Ambulatory Visit
Admission: EM | Admit: 2020-12-24 | Discharge: 2020-12-24 | Disposition: A | Discharge status: Home / Self Care | Payer: Medicare Other | Attending: Family Medicine | Admitting: Family Medicine

## 2020-12-24 ENCOUNTER — Observation Stay (HOSPITAL_COMMUNITY)
Admission: EM | Admit: 2020-12-24 | Discharge: 2020-12-25 | Disposition: A | Discharge status: Home / Self Care | Payer: Medicare Other | Attending: Family Medicine | Admitting: Family Medicine

## 2020-12-24 DIAGNOSIS — M79662 Pain in left lower leg: Secondary | ICD-10-CM

## 2020-12-24 DIAGNOSIS — L02415 Cutaneous abscess of right lower limb: Secondary | ICD-10-CM | POA: Diagnosis present

## 2020-12-24 DIAGNOSIS — I2510 Atherosclerotic heart disease of native coronary artery without angina pectoris: Secondary | ICD-10-CM | POA: Diagnosis not present

## 2020-12-24 DIAGNOSIS — Z8546 Personal history of malignant neoplasm of prostate: Secondary | ICD-10-CM | POA: Insufficient documentation

## 2020-12-24 DIAGNOSIS — I4891 Unspecified atrial fibrillation: Secondary | ICD-10-CM | POA: Diagnosis present

## 2020-12-24 DIAGNOSIS — I10 Essential (primary) hypertension: Secondary | ICD-10-CM | POA: Diagnosis present

## 2020-12-24 DIAGNOSIS — Z20822 Contact with and (suspected) exposure to covid-19: Secondary | ICD-10-CM | POA: Diagnosis not present

## 2020-12-24 DIAGNOSIS — L089 Local infection of the skin and subcutaneous tissue, unspecified: Secondary | ICD-10-CM | POA: Diagnosis not present

## 2020-12-24 DIAGNOSIS — M79604 Pain in right leg: Secondary | ICD-10-CM

## 2020-12-24 DIAGNOSIS — I110 Hypertensive heart disease with heart failure: Secondary | ICD-10-CM | POA: Diagnosis not present

## 2020-12-24 DIAGNOSIS — Z96651 Presence of right artificial knee joint: Secondary | ICD-10-CM | POA: Insufficient documentation

## 2020-12-24 DIAGNOSIS — Z7901 Long term (current) use of anticoagulants: Secondary | ICD-10-CM | POA: Diagnosis not present

## 2020-12-24 DIAGNOSIS — Z87891 Personal history of nicotine dependence: Secondary | ICD-10-CM | POA: Diagnosis not present

## 2020-12-24 DIAGNOSIS — I5032 Chronic diastolic (congestive) heart failure: Secondary | ICD-10-CM | POA: Insufficient documentation

## 2020-12-24 DIAGNOSIS — Z950 Presence of cardiac pacemaker: Secondary | ICD-10-CM | POA: Diagnosis not present

## 2020-12-24 DIAGNOSIS — L03115 Cellulitis of right lower limb: Principal | ICD-10-CM | POA: Insufficient documentation

## 2020-12-24 DIAGNOSIS — E119 Type 2 diabetes mellitus without complications: Secondary | ICD-10-CM | POA: Diagnosis not present

## 2020-12-24 DIAGNOSIS — T148XXA Other injury of unspecified body region, initial encounter: Secondary | ICD-10-CM | POA: Diagnosis not present

## 2020-12-24 DIAGNOSIS — S81001A Unspecified open wound, right knee, initial encounter: Secondary | ICD-10-CM

## 2020-12-24 LAB — COMPREHENSIVE METABOLIC PANEL
ALT: 16 U/L (ref 0–44)
AST: 20 U/L (ref 15–41)
Albumin: 3.5 g/dL (ref 3.5–5.0)
Alkaline Phosphatase: 36 U/L — ABNORMAL LOW (ref 38–126)
Anion gap: 7 (ref 5–15)
BUN: 28 mg/dL — ABNORMAL HIGH (ref 8–23)
CO2: 25 mmol/L (ref 22–32)
Calcium: 8.6 mg/dL — ABNORMAL LOW (ref 8.9–10.3)
Chloride: 105 mmol/L (ref 98–111)
Creatinine, Ser: 1.21 mg/dL (ref 0.61–1.24)
GFR, Estimated: 59 mL/min — ABNORMAL LOW (ref >60)
Glucose, Bld: 136 mg/dL — ABNORMAL HIGH (ref 70–99)
Potassium: 3.7 mmol/L (ref 3.5–5.1)
Sodium: 137 mmol/L (ref 135–145)
Total Bilirubin: 1.2 mg/dL (ref 0.3–1.2)
Total Protein: 6.3 g/dL — ABNORMAL LOW (ref 6.5–8.1)

## 2020-12-24 LAB — PROTIME-INR
INR: 2.8 — ABNORMAL HIGH (ref 0.8–1.2)
Prothrombin Time: 28.5 seconds — ABNORMAL HIGH (ref 11.4–15.2)

## 2020-12-24 LAB — CBC WITH DIFFERENTIAL/PLATELET
Abs Immature Granulocytes: 0.01 10*3/uL (ref 0.00–0.07)
Basophils Absolute: 0.1 10*3/uL (ref 0.0–0.1)
Basophils Relative: 1 %
Eosinophils Absolute: 0.1 10*3/uL (ref 0.0–0.5)
Eosinophils Relative: 2 %
HCT: 38.6 % — ABNORMAL LOW (ref 39.0–52.0)
Hemoglobin: 12.3 g/dL — ABNORMAL LOW (ref 13.0–17.0)
Immature Granulocytes: 0 %
Lymphocytes Relative: 19 %
Lymphs Abs: 1.1 10*3/uL (ref 0.7–4.0)
MCH: 31.6 pg (ref 26.0–34.0)
MCHC: 31.9 g/dL (ref 30.0–36.0)
MCV: 99.2 fL (ref 80.0–100.0)
Monocytes Absolute: 0.6 10*3/uL (ref 0.1–1.0)
Monocytes Relative: 11 %
Neutro Abs: 3.8 10*3/uL (ref 1.7–7.7)
Neutrophils Relative %: 67 %
Platelets: 146 10*3/uL — ABNORMAL LOW (ref 150–400)
RBC: 3.89 MIL/uL — ABNORMAL LOW (ref 4.22–5.81)
RDW: 13.8 % (ref 11.5–15.5)
WBC: 5.8 10*3/uL (ref 4.0–10.5)
nRBC: 0 % (ref 0.0–0.2)

## 2020-12-24 LAB — LACTIC ACID, PLASMA: Lactic Acid, Venous: 1.4 mmol/L (ref 0.5–1.9)

## 2020-12-24 MED ORDER — VANCOMYCIN HCL 1750 MG/350ML IV SOLN
1750.0000 mg | Freq: Once | INTRAVENOUS | Status: AC
  Administered 2020-12-24: 1750 mg via INTRAVENOUS
  Filled 2020-12-24: qty 350

## 2020-12-24 MED ORDER — ALPHA-LIPOIC ACID 600 MG PO CAPS: 600.0000 mg | ORAL_CAPSULE | Freq: Every day | ORAL | Status: DC

## 2020-12-24 MED ORDER — WARFARIN - PHARMACIST DOSING INPATIENT: Freq: Every day | Status: DC

## 2020-12-24 MED ORDER — VANCOMYCIN HCL IN DEXTROSE 1-5 GM/200ML-% IV SOLN: 1000.0000 mg | INTRAVENOUS | Status: DC

## 2020-12-24 MED ORDER — PIPERACILLIN-TAZOBACTAM 3.375 G IVPB 30 MIN
3.3750 g | Freq: Once | INTRAVENOUS | Status: AC
  Administered 2020-12-24: 3.375 g via INTRAVENOUS
  Filled 2020-12-24: qty 50

## 2020-12-24 MED ORDER — SODIUM CHLORIDE 0.9 % IV SOLN
INTRAVENOUS | Status: DC | PRN
  Administered 2020-12-24: 500 mL via INTRAVENOUS

## 2020-12-24 MED ORDER — ACETAMINOPHEN 325 MG PO TABS
650.0000 mg | ORAL_TABLET | Freq: Four times a day (QID) | ORAL | Status: DC | PRN
  Administered 2020-12-24 – 2020-12-25 (×2): 650 mg via ORAL
  Filled 2020-12-24 (×2): qty 2

## 2020-12-24 MED ORDER — WARFARIN 1.25 MG HALF TABLET: 1.2500 mg | ORAL_TABLET | ORAL | Status: DC

## 2020-12-24 MED ORDER — METOPROLOL SUCCINATE ER 50 MG PO TB24
50.0000 mg | ORAL_TABLET | Freq: Every day | ORAL | Status: DC
  Administered 2020-12-25: 50 mg via ORAL
  Filled 2020-12-24: qty 1

## 2020-12-24 MED ORDER — WARFARIN SODIUM 1 MG PO TABS
1.0000 mg | ORAL_TABLET | Freq: Once | ORAL | Status: AC
  Administered 2020-12-24: 1 mg via ORAL
  Filled 2020-12-24: qty 1

## 2020-12-24 MED ORDER — LORATADINE 10 MG PO TABS
10.0000 mg | ORAL_TABLET | Freq: Every day | ORAL | Status: DC
  Administered 2020-12-24: 10 mg via ORAL
  Filled 2020-12-24: qty 1

## 2020-12-24 MED ORDER — FUROSEMIDE 20 MG PO TABS
20.0000 mg | ORAL_TABLET | Freq: Every day | ORAL | Status: DC
  Administered 2020-12-25: 20 mg via ORAL
  Filled 2020-12-24: qty 1

## 2020-12-24 MED ORDER — ACETAMINOPHEN 650 MG RE SUPP: 650.0000 mg | Freq: Four times a day (QID) | RECTAL | Status: DC | PRN

## 2020-12-24 NOTE — Progress Notes (Signed)
Pharmacy Antibiotic Note  [name] is a 87 y.o. male admitted on 12/24/2020 with cellulitis.  Pharmacy has been consulted for Vancomycin dosing.  Plan: Vancomycin 1750 mg IV x 1 dose. Vancomycin 1000 mg IV every 24 hours. Expected AUC 476. Monitor labs, c/s, and vanco level as indicated.  Height: 5\' 6"  (167.6 cm) Weight: 83.9 kg (185 lb) IBW/kg (Calculated) : 63.8  Temp (24hrs), Avg:97.9 F (36.6 C), Min:97.7 F (36.5 C), Max:98 F (36.7 C)  Recent Labs  Lab 12/24/20 1454 12/24/20 1456  WBC 5.8  --   CREATININE 1.21  --   LATICACIDVEN  --  1.4    Estimated Creatinine Clearance: 46.2 mL/min (by C-G formula based on SCr of 1.21 mg/dL).    Allergies  Allergen Reactions  . Shellfish-Derived Products Other (See Comments)    Will cause his gout to get worse, pt states doctor told him to eat no shell fish  . Tape     Blistering    Antimicrobials this admission: Vanco 2/19 >>       Microbiology results: 2/19 BCx: pending 2/19 MRSA PCR: pending  Thank you for allowing pharmacy to be a part of this patient's care.  Ramond Craver 12/24/2020 6:46 PM

## 2020-12-24 NOTE — ED Triage Notes (Signed)
History to injury to right leg 3 months ago.   Wound on leg started to drain last week.  States he thinks it is infected now.

## 2020-12-24 NOTE — Discharge Instructions (Addendum)
Xray shows that there is not bone involvement   Go to the ER for further evaluation and treatment

## 2020-12-24 NOTE — ED Triage Notes (Signed)
Pt ambulatory to er room number 9, pt states that he has two wounds on his leg and he was seen at urgent care today and they did an x ray and said that it wasn't in the bone, but thought at that he needed to go to the er to get iv abx and possible admission.

## 2020-12-24 NOTE — H&P (Addendum)
History and Physical:    [name]   WCH:[id number] DOB: 1934/09/30 DOA: 12/24/2020  Referring MD/provider: Evalee Jefferson, PA PCP: Sharilyn Sites, MD   Patient coming from: Home  Chief Complaint: Increased weeping from right lower extremity wound  History of Present Illness:   [name] is an 87 y.o. male with PMH significant for CAD, atrial fibrillation, diet-controlled DM2 versus prediabetes and chronic venous stasis who was in his usual state of health until 2 to 3 months ago when he started getting blisters in his lower extremities bilaterally.  Of note patient lost his wife of several decades 6 months ago and admits he has been sitting with his legs hanging down a lot since her demise.  Patient states he tried to take care of the blistering in his lower extremities by himself but notes that the blisters have split open and he has had wounds on his legs.  Patient is kept his wounds clean and covered.  Of note, patient does not have a PCP, does not have anybody managing his sugars.  Does not take any medications but checks his fingersticks and notes they are 110-120 in the morning.  He is only followed by cardiology for his atrial fibrillation.  Over the past couple of days patient has noted increased weeping of the wounds on his right lower extremity and he thinks it has had a smell so was worried he was infected.  He went to an urgent care center who sent him to the ED.  The urgent care center thought he had exposed tendons which he does not.  Patient denies any fevers or chills.  No increased tenderness on his lower extremity.  His main concern is increased weeping of crusty yellow fluid.  He also thinks there is a foul odor.  No malaise.  No systemic symptoms at all.   ED Course:  The patient was noted to be afebrile and normotensive.  Laboratory data was revealing only for an absence of leukocytosis.  X-ray of tib-fib was negative for any bony abnormality or OM.  There is some  concern of increased erythema so patient was called in for admission for cellulitis.  ROS:   ROS   Review of Systems: General: Denies fever, chills, malaise,  Respiratory: Denies cough, SOB at rest or hemoptysis Cardiovascular: Denies chest pain or palpitations GI: Denies nausea, vomiting, diarrhea or constipation GU: Denies dysuria, frequency or hematuria CNS: Denies HA, dizziness, confusion, new weakness or clumsiness.   Past Medical History:   Past Medical History:  Diagnosis Date  . Arthritis   . Atrial fibrillation ([place])   . Barrett's esophagus   . CAD (coronary artery disease)    echo 09-05-2010-EF nl, mod-severe dilated Left atrium; myoview 02-08-12-EF 52%, no ischemia  . Cancer [facility])    prostate - radiation only  . Diabetes mellitus without complication ([place])    "prediabetic"-no oral meds  . GERD (gastroesophageal reflux disease)   . Gout   . H/O transesophageal echocardiography (TEE) for monitoring 09/05/10   tee guided AT pace termination, did not proceed with cardioversion due to termination of the atrial flutter through his pacemaker  . History of cardioversion 02/09/13   electrical synchronized cardioversion, on flecainide and warfarin  . History of kidney stones    per pt - no actual diagnosisi  . History of stomach ulcers    many yrs ago  . HTN (hypertension)   . Pacemaker    medtronic  . Pacemaker 09-25-13  .  Prostate cancer ([place]) 09-25-13   tx. with radiation only- dx. 2013    Past Surgical History:   Past Surgical History:  Procedure Laterality Date  . APPENDECTOMY    . CARDIAC CATHETERIZATION  02/18/06   EF 65%, focal napkin ring-like lesion into the prox LAD otherwise normal coronaries  . CARDIOVERSION N/A 02/09/2013   Procedure: CARDIOVERSION;  Surgeon: Sanda Klein, MD;  Location: [facility];  Service: Cardiovascular;  Laterality: N/A;  . PACEMAKER GENERATOR CHANGE N/A 07/27/2014   Procedure: PACEMAKER GENERATOR CHANGE;  Surgeon: Sanda Klein, MD;  Location: [facility] CATH LAB;  Service: Cardiovascular;  Laterality: N/A;  . PACEMAKER INSERTION     medtronic adapta- Dr. Sallyanne Kuster follows- LOV 9'14  . TONSILLECTOMY    . TOTAL KNEE ARTHROPLASTY Right 10/05/2013   Procedure: RIGHT TOTAL KNEE ARTHROPLASTY;  Surgeon: Gearlean Alf, MD;  Location: [facility];  Service: Orthopedics;  Laterality: Right;    Social History:   Social History   Socioeconomic History  . Marital status: Married    Spouse name: Not on file  . Number of children: Not on file  . Years of education: Not on file  . Highest education level: Not on file  Occupational History  . Not on file  Tobacco Use  . Smoking status: Former Smoker    Quit date: 07/22/1970    Years since quitting: 50.4  . Smokeless tobacco: Never Used  . Tobacco comment: Quit in the 56s  Substance and Sexual Activity  . Alcohol use: No  . Drug use: No  . Sexual activity: Not Currently  Other Topics Concern  . Not on file  Social History Narrative  . Not on file   Social Determinants of Health   Financial Resource Strain: Not on file  Food Insecurity: Not on file  Transportation Needs: Not on file  Physical Activity: Not on file  Stress: Not on file  Social Connections: Not on file  Intimate Partner Violence: Not on file    Allergies   Shellfish-derived products and Tape  Family history:   Family History  Problem Relation Age of Onset  . Diabetes Mother   . Diabetes Brother     Current Medications:   Prior to Admission medications   Medication Sig Start Date End Date Taking? Authorizing Provider  acetaminophen (TYLENOL) 650 MG CR tablet Take 650 mg by mouth every 8 (eight) hours as needed for pain.    Yes [provider]  Alpha-Lipoic Acid 600 MG CAPS Take 600 mg by mouth daily.   Yes [provider]  calcium-vitamin D (OSCAL WITH D) 500-200 MG-UNIT tablet Take 1 tablet by mouth.   Yes [provider]  furosemide (LASIX) 20 MG tablet  Take 1 tablet (20 mg total) by mouth as needed for fluid or edema. Patient taking differently: Take 20 mg by mouth daily. 08/24/20  Yes Croitoru, Mihai, MD  loratadine (CLARITIN) 10 MG tablet Take 10 mg by mouth at bedtime.   Yes [provider]  metoprolol succinate (TOPROL-XL) 50 MG 24 hr tablet Take 1 tablet (50 mg total) by mouth daily. Take with or immediately following a meal. 08/24/20  Yes Croitoru, Mihai, MD  Misc Natural Products (TART CHERRY ADVANCED PO) Take 1,200 mg by mouth daily.    Yes [provider]  Omega-3 Fatty Acids (OMEGA 3 PO) Take 1 capsule by mouth daily.    Yes [provider]  Potassium 99 MG TABS Take 1 tablet by mouth daily as needed (  cramping).   Yes [provider]  warfarin (COUMADIN) 2.5 MG tablet Take 1 to 2 tablets daily as directed by the coumadin clinic Patient taking differently: Take 1.25-2.5 mg by mouth See admin instructions. Take 1 to 2 tablets daily as directed by the coumadin clinic; 2.5 mg Mon Wed Fri, 1.25 mg all other days 10/03/20  Yes Croitoru, Mihai, MD    Physical Exam:   Vitals:   12/24/20 1259 12/24/20 1301 12/24/20 1400 12/24/20 1607  BP: (!) 157/82  139/62 136/78  Pulse: 93  (!) 59 61  Resp: 16   20  Temp: 97.7 F (36.5 C)   98 F (36.7 C)  TempSrc: Oral   Oral  SpO2: 100%  97% 96%  Weight:  83.9 kg    Height:  5\' 6"  (1.676 m)       Physical Exam: Blood pressure 136/78, pulse 61, temperature 98 F (36.7 C), temperature source Oral, resp. rate 20, height 5\' 6"  (1.676 m), weight 83.9 kg, SpO2 96 %. Gen: Well dressed, well groomed gentleman looking stated age lying in bed in no acute distress. Eyes: sclera anicteric, conjuctiva clear without injection CVS: S1-S2, irregulary, no gallops Respiratory:  decreased air entry likely secondary to decreased inspiratory effort GI: NABS, soft, NT  LE: Patient has stigmata of chronic venous stasis disease with chronic brawny edema and circumferential  postinflammatory hyperpigmentation bilaterally.  He has a couple of areas of stage II-III ulcers on his right leg and a healing ulcer on his left leg.  There is not really any increase in erythema however there is increased warmth on the right leg that may be suggestive of infection.  No increased tenderness.  There is no purulence.  He does have some serous crusting around his ulcers. Neuro:  grossly nonfocal.  Psych:  mood and affect appropriate to situation.   Data Review:    Labs: Basic Metabolic Panel: Recent Labs  Lab 12/24/20 1454  NA 137  K 3.7  CL 105  CO2 25  GLUCOSE 136*  BUN 28*  CREATININE 1.21  CALCIUM 8.6*   Liver Function Tests: Recent Labs  Lab 12/24/20 1454  AST 20  ALT 16  ALKPHOS 36*  BILITOT 1.2  PROT 6.3*  ALBUMIN 3.5   No results for input(s): LIPASE, AMYLASE in the last 168 hours. No results for input(s): AMMONIA in the last 168 hours. CBC: Recent Labs  Lab 12/24/20 1454  WBC 5.8  NEUTROABS 3.8  HGB 12.3*  HCT 38.6*  MCV 99.2  PLT 146*   Cardiac Enzymes: No results for input(s): CKTOTAL, CKMB, CKMBINDEX, TROPONINI in the last 168 hours.  BNP (last 3 results) No results for input(s): PROBNP in the last 8760 hours. CBG: No results for input(s): GLUCAP in the last 168 hours.  Urinalysis    Component Value Date/Time   COLORURINE YELLOW 09/25/2013 1130   APPEARANCEUR CLEAR 09/25/2013 1130   LABSPEC 1.031 (H) 09/25/2013 1130   PHURINE 5.0 09/25/2013 1130   GLUCOSEU NEGATIVE 09/25/2013 1130   HGBUR NEGATIVE 09/25/2013 1130   BILIRUBINUR NEGATIVE 09/25/2013 1130   KETONESUR NEGATIVE 09/25/2013 1130   PROTEINUR NEGATIVE 09/25/2013 1130   UROBILINOGEN 0.2 09/25/2013 1130   NITRITE NEGATIVE 09/25/2013 1130   LEUKOCYTESUR NEGATIVE 09/25/2013 1130      Radiographic Studies: DG Tibia/Fibula Right  Result Date: 12/24/2020 CLINICAL DATA:  87 year old male with LEFT LOWER extremity pain, swelling and infection. EXAM: RIGHT TIBIA AND  FIBULA - 2 VIEW COMPARISON:  None. FINDINGS: There  is no evidence of acute fracture, subluxation or dislocation. No radiographic evidence of acute osteomyelitis noted. Soft tissue swelling is present. RIGHT total knee arthroplasty changes are identified. Degenerative changes in the midfoot and moderate plantar calcaneal spur is noted. IMPRESSION: 1. Soft tissue swelling without acute bony abnormality. No radiographic evidence of acute osteomyelitis. 2. Degenerative changes in the midfoot and moderate plantar calcaneal spur. Electronically Signed   By: Margarette Canada M.D.   On: 12/24/2020 10:37    EKG: Ordered and pending   Assessment/Plan:   Principal Problem:   Cellulitis and abscess of right lower extremity Active Problems:   Atrial fibrillation (HCC)   HTN (hypertension)   Long term current use of anticoagulant therapy   Chronic diastolic heart failure (HCC)   Diabetes mellitus without complication ([place])  87 year old man with chronic venous stasis disease now presents with some increased warmth surrounding a chronic stage III ulcer.  Cellulitis Patient does not have too many stigmata of cellulitis, no fever, no white count, no increased erythema although it is hard to tell given significant postinflammatory hyperpigmentation. However he does have increased warmth on the right compared to the left so I suspect he probably does have some level of cellulitis. Will admit and start vancomycin per pharmacy consultation  Chronic venous stasis disease Wound consult placed (pictures in PA Idol's note) Patient would really benefit from follow-up in an outpatient wound clinic upon discharge  Atrial fibrillation Continue metoprolol for rate control Continue warfarin with pharmacy consultation   HTN Continue metoprolol  Abnormal blood sugars/DM We will check hemoglobin A1c Patient really needs referral for PCP.    Other information:   DVT prophylaxis: On warfarin Code Status:  Full Family Communication: Patient states no need to call anybody Disposition Plan: Home Consults called: None Admission status: Observation  Elleen Coulibaly Tublu Mackay [facility]  If 7PM-7AM, please contact night-coverage [email or web address] Password [facility] 12/24/2020, 5:43 PM

## 2020-12-24 NOTE — Progress Notes (Addendum)
ANTICOAGULATION CONSULT NOTE - Initial Consult  Pharmacy Consult for warfarin Indication: atrial fibrillation  Allergies  Allergen Reactions  . Shellfish-Derived Products Other (See Comments)    Will cause his gout to get worse, pt states doctor told him to eat no shell fish  . Tape     Blistering    Patient Measurements: Height: 5\' 6"  (167.6 cm) Weight: 83.9 kg (185 lb) IBW/kg (Calculated) : 63.8 Heparin Dosing Weight:   Vital Signs: Temp: 98 F (36.7 C) (02/19 1607) Temp Source: Oral (02/19 1607) BP: 136/78 (02/19 1607) Pulse Rate: 61 (02/19 1607)  Labs: Recent Labs    12/24/20 1454  HGB 12.3*  HCT 38.6*  PLT 146*  CREATININE 1.21    Estimated Creatinine Clearance: 46.2 mL/min (by C-G formula based on SCr of 1.21 mg/dL).   Medical History: Past Medical History:  Diagnosis Date  . Arthritis   . Atrial fibrillation ([place])   . Barrett's esophagus   . CAD (coronary artery disease)    echo 09-05-2010-EF nl, mod-severe dilated Left atrium; myoview 02-08-12-EF 52%, no ischemia  . Cancer [facility])    prostate - radiation only  . Diabetes mellitus without complication ([place])    "prediabetic"-no oral meds  . GERD (gastroesophageal reflux disease)   . Gout   . H/O transesophageal echocardiography (TEE) for monitoring 09/05/10   tee guided AT pace termination, did not proceed with cardioversion due to termination of the atrial flutter through his pacemaker  . History of cardioversion 02/09/13   electrical synchronized cardioversion, on flecainide and warfarin  . History of kidney stones    per pt - no actual diagnosisi  . History of stomach ulcers    many yrs ago  . HTN (hypertension)   . Pacemaker    medtronic  . Pacemaker 09-25-13  . Prostate cancer ([place]) 09-25-13   tx. with radiation only- dx. 2013    Medications:  Medications Prior to Admission  Medication Sig Dispense Refill Last Dose  . acetaminophen (TYLENOL) 650 MG CR tablet Take 650 mg by mouth every 8  (eight) hours as needed for pain.    12/24/2020 at Unknown time  . Alpha-Lipoic Acid 600 MG CAPS Take 600 mg by mouth daily.   12/23/2020 at Unknown time  . calcium-vitamin D (OSCAL WITH D) 500-200 MG-UNIT tablet Take 1 tablet by mouth.   12/23/2020 at Unknown time  . furosemide (LASIX) 20 MG tablet Take 1 tablet (20 mg total) by mouth as needed for fluid or edema. (Patient taking differently: Take 20 mg by mouth daily.) 90 tablet 3 12/24/2020 at Unknown time  . loratadine (CLARITIN) 10 MG tablet Take 10 mg by mouth at bedtime.   12/23/2020 at Unknown time  . metoprolol succinate (TOPROL-XL) 50 MG 24 hr tablet Take 1 tablet (50 mg total) by mouth daily. Take with or immediately following a meal. 90 tablet 3 12/24/2020 at 0730  . Misc Natural Products (TART CHERRY ADVANCED PO) Take 1,200 mg by mouth daily.    12/23/2020 at Unknown time  . Omega-3 Fatty Acids (OMEGA 3 PO) Take 1 capsule by mouth daily.    12/23/2020 at Unknown time  . Potassium 99 MG TABS Take 1 tablet by mouth daily as needed (cramping).   Past Week at Unknown time  . warfarin (COUMADIN) 2.5 MG tablet Take 1 to 2 tablets daily as directed by the coumadin clinic (Patient taking differently: Take 1.25-2.5 mg by mouth See admin instructions. Take 1 to 2 tablets daily as directed  by the coumadin clinic; 2.5 mg Mon Wed Fri, 1.25 mg all other days) 90 tablet 1 12/23/2020 at 2200    Assessment: Pharmacy consulted to dose warfarin in patient with atrial fibrillation.  Patient's last dose 2/18 @ 2200.  Home dose listed as 2.5 mg MWF and 1.25 mg ROW.  Goal of Therapy:  INR 2-3 Monitor platelets by anticoagulation protocol: Yes   Plan:  Warfarin 1 mg x 1 dose. Monitor daily INR and s/s of bleeding.  Ramond Craver 12/24/2020,6:48 PM

## 2020-12-24 NOTE — ED Provider Notes (Signed)
[facility] EMERGENCY DEPARTMENT Provider Note   CSN: [id number] Arrival date & time: 12/24/20  1248     History Chief Complaint  Patient presents with  . Wound Check    BARAA TUBBS is a 87 y.o. male with a history including atrial fibrillation, CAD, diet-controlled prediabetes, stating he checks blood sugars at home and they are generally in the 110-120 range range every morning, hypertension presenting for evaluation of bilateral lower extremity wounds.  He states approximately 8 to 10 weeks ago he started to develop wounds on his bilateral lower legs, initially had a trauma to his right posterior calf region which created a wound, followed by development of a bulla lateral to the initial injury which eventually drained clear fluid, now a another bulla has developed and recently started draining.  He endorses redness and swelling, denies significant pain and has had no fevers or chills.  He has an old healing similar wound on his left lower leg as well.  He endorses worsening drainage from the newest wound site along with a foul odor.  He denies significant pain at the sites, he does endorse having peripheral neuropathy.  He was seen at a urgent care center prior to arrival here, no treatment prior to today except for home treatment with dressings.  He had x-rays at the urgent care center prior to arriving here.  States he had to have an eye surgery for cataracts this week, was trying to get that behind him before he started to address the leg issues.  HPI     Past Medical History:  Diagnosis Date  . Arthritis   . Atrial fibrillation ([place])   . Barrett's esophagus   . CAD (coronary artery disease)    echo 09-05-2010-EF nl, mod-severe dilated Left atrium; myoview 02-08-12-EF 52%, no ischemia  . Cancer [facility])    prostate - radiation only  . Diabetes mellitus without complication ([place])    "prediabetic"-no oral meds  . GERD (gastroesophageal reflux disease)   . Gout   . H/O  transesophageal echocardiography (TEE) for monitoring 09/05/10   tee guided AT pace termination, did not proceed with cardioversion due to termination of the atrial flutter through his pacemaker  . History of cardioversion 02/09/13   electrical synchronized cardioversion, on flecainide and warfarin  . History of kidney stones    per pt - no actual diagnosisi  . History of stomach ulcers    many yrs ago  . HTN (hypertension)   . Pacemaker    medtronic  . Pacemaker 09-25-13  . Prostate cancer ([place]) 09-25-13   tx. with radiation only- dx. 2013    Patient Active Problem List   Diagnosis Date Noted  . SSS (sick sinus syndrome) ([place]) 07/27/2014  . Pacemaker syndrome 07/27/2014  . Tachy-brady syndrome ([place]) 07/27/2014  . Difficulty in walking(719.7) 11/03/2013  . Stiffness of right knee 11/03/2013  . Postoperative anemia due to acute blood loss 10/06/2013  . OA (osteoarthritis) of knee 10/05/2013  . Preoperative cardiovascular examination 07/20/2013  . Chronic diastolic heart failure ([place]) 04/29/2013  . Long QT interval 04/21/2013  . Volume overload 04/21/2013  . Long term current use of anticoagulant therapy 01/09/2013  . HTN (hypertension) 05/20/2011  . Pleural effusion 05/20/2011  . Lactic acid acidosis 05/18/2011  . Pacemaker 05/18/2011  . Anemia 05/18/2011  . Acute renal failure (ARF) ([place]) 05/18/2011  . Pneumonia 05/17/2011  . Fever 05/17/2011  . Atrial fibrillation ([place]) 05/17/2011  . Hyperglycemia 05/17/2011  . Bradycardia 05/17/2011  .  Gout 05/17/2011  . Arthritis 05/17/2011  . Thrombocytopenia ([place]) 05/17/2011    Past Surgical History:  Procedure Laterality Date  . APPENDECTOMY    . CARDIAC CATHETERIZATION  02/18/06   EF 65%, focal napkin ring-like lesion into the prox LAD otherwise normal coronaries  . CARDIOVERSION N/A 02/09/2013   Procedure: CARDIOVERSION;  Surgeon: Sanda Klein, MD;  Location: [place] ENDOSCOPY;  Service: Cardiovascular;  Laterality: N/A;  .  PACEMAKER GENERATOR CHANGE N/A 07/27/2014   Procedure: PACEMAKER GENERATOR CHANGE;  Surgeon: Sanda Klein, MD;  Location: [place] CATH LAB;  Service: Cardiovascular;  Laterality: N/A;  . PACEMAKER INSERTION     medtronic adapta- Dr. Sallyanne Kuster follows- LOV 9'14  . TONSILLECTOMY    . TOTAL KNEE ARTHROPLASTY Right 10/05/2013   Procedure: RIGHT TOTAL KNEE ARTHROPLASTY;  Surgeon: Gearlean Alf, MD;  Location: [facility]S;  Service: Orthopedics;  Laterality: Right;       Family History  Problem Relation Age of Onset  . Diabetes Mother   . Diabetes Brother     Social History   Tobacco Use  . Smoking status: Former Smoker    Quit date: 07/22/1970    Years since quitting: 50.4  . Smokeless tobacco: Never Used  . Tobacco comment: Quit in the 70s  Substance Use Topics  . Alcohol use: No  . Drug use: No    Home Medications Prior to Admission medications   Medication Sig Start Date End Date Taking? Authorizing Provider  acetaminophen (TYLENOL) 650 MG CR tablet Take 650 mg by mouth every 8 (eight) hours as needed for pain.     [provider]  Alpha-Lipoic Acid 600 MG CAPS Take 600 mg by mouth daily.    [provider]  furosemide (LASIX) 20 MG tablet Take 1 tablet (20 mg total) by mouth as needed for fluid or edema. Patient taking differently: Take 20 mg by mouth daily. 08/24/20   Croitoru, Mihai, MD  loratadine (CLARITIN) 10 MG tablet Take 10 mg by mouth at bedtime.    [provider]  metoprolol succinate (TOPROL-XL) 50 MG 24 hr tablet Take 1 tablet (50 mg total) by mouth daily. Take with or immediately following a meal. 08/24/20   Croitoru, Mihai, MD  Misc Natural Products (TART CHERRY ADVANCED PO) Take 1,200 mg by mouth daily.     [provider]  Omega-3 Fatty Acids (OMEGA 3 PO) Take 1 capsule by mouth daily.     [provider]  Potassium 99 MG TABS Take 1 tablet by mouth daily as needed (cramping).    [provider]  warfarin (COUMADIN)  2.5 MG tablet Take 1 to 2 tablets daily as directed by the coumadin clinic Patient taking differently: Take 1.25-2.5 mg by mouth See admin instructions. Take 1 to 2 tablets daily as directed by the coumadin clinic; 2.5 mg Mon Wed Fri, 1.25 mg all other days 10/03/20   Croitoru, Dani Gobble, MD    Allergies    Shellfish-derived products and Tape  Review of Systems   Review of Systems  Constitutional: Negative for chills and fever.  HENT: Negative for congestion.   Eyes: Negative.   Respiratory: Negative for chest tightness and shortness of breath.   Cardiovascular: Negative for chest pain.  Gastrointestinal: Negative for abdominal pain, nausea and vomiting.  Genitourinary: Negative.   Musculoskeletal: Negative for arthralgias, joint swelling and neck pain.  Skin: Positive for color change and wound. Negative for rash.  Neurological: Negative for dizziness, weakness, light-headedness, numbness and headaches.  Psychiatric/Behavioral:  Negative.   All other systems reviewed and are negative.   Physical Exam Updated Vital Signs BP 136/78   Pulse 61   Temp 98 F (36.7 C) (Oral)   Resp 20   Ht 5\' 6"  (1.676 m)   Wt 83.9 kg   SpO2 96%   BMI 29.86 kg/m   Physical Exam Vitals and nursing note reviewed.  Constitutional:      Appearance: He is well-developed and well-nourished.  HENT:     Head: Normocephalic and atraumatic.  Eyes:     Conjunctiva/sclera: Conjunctivae normal.  Cardiovascular:     Rate and Rhythm: Normal rate and regular rhythm.     Pulses: Intact distal pulses.     Heart sounds: Normal heart sounds.  Pulmonary:     Effort: Pulmonary effort is normal.     Breath sounds: Normal breath sounds. No wheezing.  Abdominal:     General: Bowel sounds are normal.     Palpations: Abdomen is soft.     Tenderness: There is no abdominal tenderness.  Musculoskeletal:        General: Swelling present. No tenderness. Normal range of motion.     Cervical back: Normal range of  motion.  Skin:    General: Skin is warm and dry.     Comments: Multiple ulcerative/ macular wounds  right lower extremity.  Erythema and edema.  Refer to photo.  Feet warm, pulses present.  Neurological:     Mental Status: He is alert.  Psychiatric:        Mood and Affect: Mood and affect normal.       ED Results / Procedures / Treatments   Labs (all labs ordered are listed, but only abnormal results are displayed) Labs Reviewed  CBC WITH DIFFERENTIAL/PLATELET - Abnormal; Notable for the following components:      Result Value   RBC 3.89 (*)    Hemoglobin 12.3 (*)    HCT 38.6 (*)    Platelets 146 (*)    All other components within normal limits  COMPREHENSIVE METABOLIC PANEL - Abnormal; Notable for the following components:   Glucose, Bld 136 (*)    BUN 28 (*)    Calcium 8.6 (*)    Total Protein 6.3 (*)    Alkaline Phosphatase 36 (*)    GFR, Estimated 59 (*)    All other components within normal limits  CULTURE, BLOOD (ROUTINE X 2)  CULTURE, BLOOD (ROUTINE X 2)  SARS CORONAVIRUS 2 (TAT 6-24 HRS)  MRSA PCR SCREENING  LACTIC ACID, PLASMA    EKG None  Radiology DG Tibia/Fibula Right  Result Date: 12/24/2020 CLINICAL DATA:  87 year old male with LEFT LOWER extremity pain, swelling and infection. EXAM: RIGHT TIBIA AND FIBULA - 2 VIEW COMPARISON:  None. FINDINGS: There is no evidence of acute fracture, subluxation or dislocation. No radiographic evidence of acute osteomyelitis noted. Soft tissue swelling is present. RIGHT total knee arthroplasty changes are identified. Degenerative changes in the midfoot and moderate plantar calcaneal spur is noted. IMPRESSION: 1. Soft tissue swelling without acute bony abnormality. No radiographic evidence of acute osteomyelitis. 2. Degenerative changes in the midfoot and moderate plantar calcaneal spur. Electronically Signed   By: Margarette Canada M.D.   On: 12/24/2020 10:37    Procedures Procedures   Medications Ordered in ED Medications   piperacillin-tazobactam (ZOSYN) IVPB 3.375 g (0 g Intravenous Stopped 12/24/20 1606)    ED Course  I have reviewed the triage vital signs and the nursing notes.  Pertinent  labs & imaging results that were available during my care of the patient were reviewed by me and considered in my medical decision making (see chart for details).    MDM Rules/Calculators/A&P                          Pt with diabetic ulcers and cellulitis predominantly right lower leg with nearly healed ulcer left posterior lower leg.  Pt was given IV Zosyn, mrsa nasal swab pending, pt does not have h/o mrsa.  Discussed with Dr. Levy Pupa who accepts pt for admission.  Final Clinical Impression(s) / ED Diagnoses Final diagnoses:  Cellulitis of right lower extremity    Rx / DC Orders ED Discharge Orders    None       Landis Martins 12/24/20 [place], Alvin Critchley, DO 12/25/20 [id number]

## 2020-12-24 NOTE — ED Provider Notes (Signed)
[facility]    CSN: [id number] Arrival date & time: 12/24/20  4098      History   Chief Complaint No chief complaint on file.   HPI [name] is a 87 y.o. male.   Reports that he has multiple wounds to his right lower leg and that they have been there for about 3 months. Reports that he has been trying to take care of them himself. Has medical history that includes HTN, A-fib, CHF, acute renal failure, long term use of anticoagulants, s/p pacemaker. Denies pain except for when his leg is swelling, which usually occurs at night. Reports that the wounds started out as blisters and the wounds got deeper as the blisters opened. Reports yellow fluid and a bad smell from the wounds. Denies fever, red streaking from the area.   ROS per HPI  The history is provided by the patient.    Past Medical History:  Diagnosis Date  . Arthritis   . Atrial fibrillation ([place])   . Barrett's esophagus   . CAD (coronary artery disease)    echo 09-05-2010-EF nl, mod-severe dilated Left atrium; myoview 02-08-12-EF 52%, no ischemia  . Cancer [facility])    prostate - radiation only  . Diabetes mellitus without complication ([place])    "prediabetic"-no oral meds  . GERD (gastroesophageal reflux disease)   . Gout   . H/O transesophageal echocardiography (TEE) for monitoring 09/05/10   tee guided AT pace termination, did not proceed with cardioversion due to termination of the atrial flutter through his pacemaker  . History of cardioversion 02/09/13   electrical synchronized cardioversion, on flecainide and warfarin  . History of kidney stones    per pt - no actual diagnosisi  . History of stomach ulcers    many yrs ago  . HTN (hypertension)   . Pacemaker    medtronic  . Pacemaker 09-25-13  . Prostate cancer ([place]) 09-25-13   tx. with radiation only- dx. 2013    Patient Active Problem List   Diagnosis Date Noted  . SSS (sick sinus syndrome) ([place]) 07/27/2014  . Pacemaker syndrome  07/27/2014  . Tachy-brady syndrome ([place]) 07/27/2014  . Difficulty in walking(719.7) 11/03/2013  . Stiffness of right knee 11/03/2013  . Postoperative anemia due to acute blood loss 10/06/2013  . OA (osteoarthritis) of knee 10/05/2013  . Preoperative cardiovascular examination 07/20/2013  . Chronic diastolic heart failure ([place]) 04/29/2013  . Long QT interval 04/21/2013  . Volume overload 04/21/2013  . Long term current use of anticoagulant therapy 01/09/2013  . HTN (hypertension) 05/20/2011  . Pleural effusion 05/20/2011  . Lactic acid acidosis 05/18/2011  . Pacemaker 05/18/2011  . Anemia 05/18/2011  . Acute renal failure (ARF) ([place]) 05/18/2011  . Pneumonia 05/17/2011  . Fever 05/17/2011  . Atrial fibrillation ([place]) 05/17/2011  . Hyperglycemia 05/17/2011  . Bradycardia 05/17/2011  . Gout 05/17/2011  . Arthritis 05/17/2011  . Thrombocytopenia ([place]) 05/17/2011    Past Surgical History:  Procedure Laterality Date  . APPENDECTOMY    . CARDIAC CATHETERIZATION  02/18/06   EF 65%, focal napkin ring-like lesion into the prox LAD otherwise normal coronaries  . CARDIOVERSION N/A 02/09/2013   Procedure: CARDIOVERSION;  Surgeon: Sanda Klein, MD;  Location: [place] ENDOSCOPY;  Service: Cardiovascular;  Laterality: N/A;  . PACEMAKER GENERATOR CHANGE N/A 07/27/2014   Procedure: PACEMAKER GENERATOR CHANGE;  Surgeon: Sanda Klein, MD;  Location: [place] CATH LAB;  Service: Cardiovascular;  Laterality: N/A;  . PACEMAKER INSERTION     medtronic  adapta- Dr. Sallyanne Kuster follows- LOV 9'14  . TONSILLECTOMY    . TOTAL KNEE ARTHROPLASTY Right 10/05/2013   Procedure: RIGHT TOTAL KNEE ARTHROPLASTY;  Surgeon: Gearlean Alf, MD;  Location[facility];  Service: Orthopedics;  Laterality: Right;       Home Medications    Prior to Admission medications   Medication Sig Start Date End Date Taking? Authorizing Provider  acetaminophen (TYLENOL) 650 MG CR tablet Take 650 mg by mouth every 8 (eight) hours as needed for  pain.     [provider]  Alpha-Lipoic Acid 600 MG CAPS Take 600 mg by mouth daily.    [provider]  furosemide (LASIX) 20 MG tablet Take 1 tablet (20 mg total) by mouth as needed for fluid or edema. Patient taking differently: Take 20 mg by mouth daily. 08/24/20   Croitoru, Mihai, MD  loratadine (CLARITIN) 10 MG tablet Take 10 mg by mouth at bedtime.    [provider]  metoprolol succinate (TOPROL-XL) 50 MG 24 hr tablet Take 1 tablet (50 mg total) by mouth daily. Take with or immediately following a meal. 08/24/20   Croitoru, Mihai, MD  Misc Natural Products (TART CHERRY ADVANCED PO) Take 1,200 mg by mouth daily.     [provider]  Omega-3 Fatty Acids (OMEGA 3 PO) Take 1 capsule by mouth daily.     [provider]  Potassium 99 MG TABS Take 1 tablet by mouth daily as needed (cramping).    [provider]  warfarin (COUMADIN) 2.5 MG tablet Take 1 to 2 tablets daily as directed by the coumadin clinic Patient taking differently: Take 1.25-2.5 mg by mouth See admin instructions. Take 1 to 2 tablets daily as directed by the coumadin clinic; 2.5 mg Mon Wed Fri, 1.25 mg all other days 10/03/20   Croitoru, Dani Gobble, MD    Family History Family History  Problem Relation Age of Onset  . Diabetes Mother   . Diabetes Brother     Social History Social History   Tobacco Use  . Smoking status: Former Smoker    Quit date: 07/22/1970    Years since quitting: 50.4  . Smokeless tobacco: Never Used  . Tobacco comment: Quit in the 70s  Substance Use Topics  . Alcohol use: No  . Drug use: No     Allergies   Shellfish-derived products and Tape   Review of Systems Review of Systems   Physical Exam Triage Vital Signs ED Triage Vitals  Enc Vitals Group     BP 12/24/20 0955 (!) 168/85     Pulse Rate 12/24/20 0955 88     Resp 12/24/20 0955 14     Temp 12/24/20 0955 97.9 F (36.6 C)     Temp Source 12/24/20 0955 Oral     SpO2 12/24/20  0955 98 %     Weight --      Height --      Head Circumference --      Peak Flow --      Pain Score 12/24/20 1006 4     Pain Loc --      Pain Edu? --      Excl. in [place]? --    No data found.  Updated Vital Signs BP (!) 168/85 (BP Location: Right Arm)   Pulse 88   Temp 97.9 F (36.6 C) (Oral)   Resp 14   SpO2 98%      Physical Exam Vitals and nursing note reviewed.  Constitutional:  General: He is not in acute distress.    Appearance: Normal appearance. He is well-developed and well-nourished. He is not ill-appearing.  HENT:     Head: Normocephalic and atraumatic.  Eyes:     Extraocular Movements: Extraocular movements intact.     Conjunctiva/sclera: Conjunctivae normal.     Pupils: Pupils are equal, round, and reactive to light.  Cardiovascular:     Rate and Rhythm: Normal rate and regular rhythm.     Heart sounds: No murmur heard.   Pulmonary:     Effort: Pulmonary effort is normal. No respiratory distress.     Breath sounds: Normal breath sounds.  Abdominal:     Palpations: Abdomen is soft.     Tenderness: There is no abdominal tenderness.  Musculoskeletal:        General: No edema.     Cervical back: Normal range of motion and neck supple.  Skin:    General: Skin is warm and dry.     Capillary Refill: Capillary refill takes less than 2 seconds.     Findings: Wound present.          Comments: Areas of open wounds to the RLE. Non tender, no warmth or tenderness to the area. All wounds are draining yellow fluid, and are malodorous.  Neurological:     General: No focal deficit present.     Mental Status: He is alert and oriented to person, place, and time.  Psychiatric:        Mood and Affect: Mood and affect and mood normal.        Behavior: Behavior normal.        Thought Content: Thought content normal.      UC Treatments / Results  Labs (all labs ordered are listed, but only abnormal results are displayed) Labs Reviewed - No data to  display  EKG   Radiology DG Tibia/Fibula Right  Result Date: 12/24/2020 CLINICAL DATA:  87 year old male with LEFT LOWER extremity pain, swelling and infection. EXAM: RIGHT TIBIA AND FIBULA - 2 VIEW COMPARISON:  None. FINDINGS: There is no evidence of acute fracture, subluxation or dislocation. No radiographic evidence of acute osteomyelitis noted. Soft tissue swelling is present. RIGHT total knee arthroplasty changes are identified. Degenerative changes in the midfoot and moderate plantar calcaneal spur is noted. IMPRESSION: 1. Soft tissue swelling without acute bony abnormality. No radiographic evidence of acute osteomyelitis. 2. Degenerative changes in the midfoot and moderate plantar calcaneal spur. Electronically Signed   By: Margarette Canada M.D.   On: 12/24/2020 10:37    Procedures Procedures (including critical care time)  Medications Ordered in UC Medications - No data to display  Initial Impression / Assessment and Plan / UC Course  I have reviewed the triage vital signs and the nursing notes.  Pertinent labs & imaging results that were available during my care of the patient were reviewed by me and considered in my medical decision making (see chart for details).     Right leg pain Wound infection  Xrays show no osteomyelitis Extensive wounds to RLE, at multiple stages of healing Eschar present to anterior RLE wounds Tendons visualized to posterior RLE wound Discussed with patient that he would be best served in the ER for further evaluation and treatment Patient is agreement To ER via POV Final Clinical Impressions(s) / UC Diagnoses   Final diagnoses:  Right leg pain  Wound infection     Discharge Instructions     Xray shows that there is not  bone involvement   Go to the ER for further evaluation and treatment      ED Prescriptions    None     PDMP not reviewed this encounter.   Faustino Congress, NP 12/24/20 1105

## 2020-12-25 DIAGNOSIS — L03115 Cellulitis of right lower limb: Secondary | ICD-10-CM | POA: Diagnosis not present

## 2020-12-25 DIAGNOSIS — I4891 Unspecified atrial fibrillation: Secondary | ICD-10-CM

## 2020-12-25 DIAGNOSIS — I5032 Chronic diastolic (congestive) heart failure: Secondary | ICD-10-CM | POA: Diagnosis not present

## 2020-12-25 DIAGNOSIS — Z7901 Long term (current) use of anticoagulants: Secondary | ICD-10-CM

## 2020-12-25 DIAGNOSIS — L02415 Cutaneous abscess of right lower limb: Secondary | ICD-10-CM

## 2020-12-25 DIAGNOSIS — E119 Type 2 diabetes mellitus without complications: Secondary | ICD-10-CM

## 2020-12-25 DIAGNOSIS — I10 Essential (primary) hypertension: Secondary | ICD-10-CM

## 2020-12-25 LAB — BASIC METABOLIC PANEL
Anion gap: 9 (ref 5–15)
BUN: 26 mg/dL — ABNORMAL HIGH (ref 8–23)
CO2: 23 mmol/L (ref 22–32)
Calcium: 8.2 mg/dL — ABNORMAL LOW (ref 8.9–10.3)
Chloride: 105 mmol/L (ref 98–111)
Creatinine, Ser: 1.05 mg/dL (ref 0.61–1.24)
GFR, Estimated: >60 mL/min (ref >60)
Glucose, Bld: 118 mg/dL — ABNORMAL HIGH (ref 70–99)
Potassium: 3.8 mmol/L (ref 3.5–5.1)
Sodium: 137 mmol/L (ref 135–145)

## 2020-12-25 LAB — CBC
HCT: 39 % (ref 39.0–52.0)
Hemoglobin: 12.3 g/dL — ABNORMAL LOW (ref 13.0–17.0)
MCH: 31.2 pg (ref 26.0–34.0)
MCHC: 31.5 g/dL (ref 30.0–36.0)
MCV: 99 fL (ref 80.0–100.0)
Platelets: 133 10*3/uL — ABNORMAL LOW (ref 150–400)
RBC: 3.94 MIL/uL — ABNORMAL LOW (ref 4.22–5.81)
RDW: 13.8 % (ref 11.5–15.5)
WBC: 6 10*3/uL (ref 4.0–10.5)
nRBC: 0 % (ref 0.0–0.2)

## 2020-12-25 LAB — SARS CORONAVIRUS 2 (TAT 6-24 HRS): SARS Coronavirus 2: NEGATIVE

## 2020-12-25 LAB — PROTIME-INR
INR: 2.8 — ABNORMAL HIGH (ref 0.8–1.2)
Prothrombin Time: 28.6 seconds — ABNORMAL HIGH (ref 11.4–15.2)

## 2020-12-25 MED ORDER — DOXYCYCLINE HYCLATE 100 MG PO CAPS: 100.0000 mg | ORAL_CAPSULE | Freq: Two times a day (BID) | ORAL | 0 refills | Status: AC

## 2020-12-25 MED ORDER — WARFARIN SODIUM 2.5 MG PO TABS: 1.2500 mg | ORAL_TABLET | ORAL | Status: DC

## 2020-12-25 MED ORDER — FUROSEMIDE 20 MG PO TABS: 20.0000 mg | ORAL_TABLET | Freq: Every day | ORAL | Status: DC

## 2020-12-25 MED ORDER — WARFARIN SODIUM 1 MG PO TABS: 1.0000 mg | ORAL_TABLET | Freq: Once | ORAL | Status: DC

## 2020-12-25 NOTE — Progress Notes (Signed)
ANTICOAGULATION CONSULT NOTE -   Pharmacy Consult for warfarin Indication: atrial fibrillation  Allergies  Allergen Reactions  . Shellfish-Derived Products Other (See Comments)    Will cause his gout to get worse, pt states doctor told him to eat no shell fish  . Tape     Blistering    Patient Measurements: Height: 5\' 6"  (167.6 cm) Weight: 83.9 kg (185 lb) IBW/kg (Calculated) : 63.8 Heparin Dosing Weight:   Vital Signs: Temp: 98.3 F (36.8 C) (02/20 0524) Temp Source: Oral (02/19 2200) BP: 134/65 (02/20 0524) Pulse Rate: 59 (02/20 0524)  Labs: Recent Labs    12/24/20 1454 12/25/20 0526  HGB 12.3* 12.3*  HCT 38.6* 39.0  PLT 146* 133*  LABPROT 28.5* 28.6*  INR 2.8* 2.8*  CREATININE 1.21 1.05    Estimated Creatinine Clearance: 53.2 mL/min (by C-G formula based on SCr of 1.05 mg/dL).   Medical History: Past Medical History:  Diagnosis Date  . Arthritis   . Atrial fibrillation ([place])   . Barrett's esophagus   . CAD (coronary artery disease)    echo 09-05-2010-EF nl, mod-severe dilated Left atrium; myoview 02-08-12-EF 52%, no ischemia  . Cancer [facility])    prostate - radiation only  . Diabetes mellitus without complication ([place])    "prediabetic"-no oral meds  . GERD (gastroesophageal reflux disease)   . Gout   . H/O transesophageal echocardiography (TEE) for monitoring 09/05/10   tee guided AT pace termination, did not proceed with cardioversion due to termination of the atrial flutter through his pacemaker  . History of cardioversion 02/09/13   electrical synchronized cardioversion, on flecainide and warfarin  . History of kidney stones    per pt - no actual diagnosisi  . History of stomach ulcers    many yrs ago  . HTN (hypertension)   . Pacemaker    medtronic  . Pacemaker 09-25-13  . Prostate cancer ([place]) 09-25-13   tx. with radiation only- dx. 2013    Medications:  Medications Prior to Admission  Medication Sig Dispense Refill Last Dose  .  acetaminophen (TYLENOL) 650 MG CR tablet Take 650 mg by mouth every 8 (eight) hours as needed for pain.    12/24/2020 at Unknown time  . Alpha-Lipoic Acid 600 MG CAPS Take 600 mg by mouth daily.   12/23/2020 at Unknown time  . calcium-vitamin D (OSCAL WITH D) 500-200 MG-UNIT tablet Take 1 tablet by mouth.   12/23/2020 at Unknown time  . furosemide (LASIX) 20 MG tablet Take 1 tablet (20 mg total) by mouth as needed for fluid or edema. (Patient taking differently: Take 20 mg by mouth daily.) 90 tablet 3 12/24/2020 at Unknown time  . loratadine (CLARITIN) 10 MG tablet Take 10 mg by mouth at bedtime.   12/23/2020 at Unknown time  . metoprolol succinate (TOPROL-XL) 50 MG 24 hr tablet Take 1 tablet (50 mg total) by mouth daily. Take with or immediately following a meal. 90 tablet 3 12/24/2020 at 0730  . Misc Natural Products (TART CHERRY ADVANCED PO) Take 1,200 mg by mouth daily.    12/23/2020 at Unknown time  . Omega-3 Fatty Acids (OMEGA 3 PO) Take 1 capsule by mouth daily.    12/23/2020 at Unknown time  . Potassium 99 MG TABS Take 1 tablet by mouth daily as needed (cramping).   Past Week at Unknown time  . warfarin (COUMADIN) 2.5 MG tablet Take 1 to 2 tablets daily as directed by the coumadin clinic (Patient taking differently: Take 1.25-2.5 mg  by mouth See admin instructions. Take 1 to 2 tablets daily as directed by the coumadin clinic; 2.5 mg Mon Wed Fri, 1.25 mg all other days) 90 tablet 1 12/23/2020 at 2200    Assessment: Pharmacy consulted to dose warfarin in patient with atrial fibrillation.  Patient's last dose 2/18 @ 2200.  Home dose listed as 2.5 mg MWF and 1.25 mg ROW.  INR 2.8  Goal of Therapy:  INR 2-3 Monitor platelets by anticoagulation protocol: Yes   Plan:  Warfarin 1 mg x 1 dose. Monitor daily INR and s/s of bleeding.  Ramond Craver 12/25/2020,9:17 AM

## 2020-12-25 NOTE — Discharge Instructions (Signed)
GO TO WOUND CARE CLINIC TOMORROW SEE PRIMARY CARE PROVIDER THIS WEEK CONTINUE LOCAL WOUND CARE WITH WET TO DRY DRESSING CHANGES TWICE DAILY.  Please monitor your PT/INR more closely while on antibiotics.  Have PT/INR rechecked every 2-3 days while on antibiotics.   RN TO CHECK PT/INR ON 2/22 AND 2/24 AND 2/26 AND REPORT RESULTS TO PCP AND CARDIOLOGY.   Cellulitis, Adult  Cellulitis is a skin infection. The infected area is often warm, red, swollen, and sore. It occurs most often in the arms and lower legs. It is very important to get treated for this condition. What are the causes? This condition is caused by bacteria. The bacteria enter through a break in the skin, such as a cut, burn, insect bite, open sore, or crack. What increases the risk? This condition is more likely to occur in people who:  Have a weak body defense system (immune system).  Have open cuts, burns, bites, or scrapes on the skin.  Are older than 85 years of age.  Have a blood sugar problem (diabetes).  Have a long-lasting (chronic) liver disease (cirrhosis) or kidney disease.  Are very overweight (obese).  Have a skin problem, such as: ? Itchy rash (eczema). ? Slow movement of blood in the veins (venous stasis). ? Fluid buildup below the skin (edema).  Have been treated with high-energy rays (radiation).  Use IV drugs. What are the signs or symptoms? Symptoms of this condition include:  Skin that is: ? Red. ? Streaking. ? Spotting. ? Swollen. ? Sore or painful when you touch it. ? Warm.  A fever.  Chills.  Blisters. How is this diagnosed? This condition is diagnosed based on:  Medical history.  Physical exam.  Blood tests.  Imaging tests. How is this treated? Treatment for this condition may include:  Medicines to treat infections or allergies.  Home care, such as: ? Rest. ? Placing cold or warm cloths (compresses) on the skin.  Hospital care, if the condition is very  bad. Follow these instructions at home: Medicines  Take over-the-counter and prescription medicines only as told by your doctor.  If you were prescribed an antibiotic medicine, take it as told by your doctor. Do not stop taking it even if you start to feel better. General instructions  Drink enough fluid to keep your pee (urine) pale yellow.  Do not touch or rub the infected area.  Raise (elevate) the infected area above the level of your heart while you are sitting or lying down.  Place cold or warm cloths on the area as told by your doctor.  Keep all follow-up visits as told by your doctor. This is important.   Contact a doctor if:  You have a fever.  You do not start to get better after 1-2 days of treatment.  Your bone or joint under the infected area starts to hurt after the skin has healed.  Your infection comes back. This can happen in the same area or another area.  You have a swollen bump in the area.  You have new symptoms.  You feel ill and have muscle aches and pains. Get help right away if:  Your symptoms get worse.  You feel very sleepy.  You throw up (vomit) or have watery poop (diarrhea) for a long time.  You see red streaks coming from the area.  Your red area gets larger.  Your red area turns dark in color. These symptoms may represent a serious problem that is an emergency.  Do not wait to see if the symptoms will go away. Get medical help right away. Call your local emergency services (911 in the [place] Do not drive yourself to the hospital. Summary  Cellulitis is a skin infection. The area is often warm, red, swollen, and sore.  This condition is treated with medicines, rest, and cold and warm cloths.  Take all medicines only as told by your doctor.  Tell your doctor if symptoms do not start to get better after 1-2 days of treatment. This information is not intended to replace advice given to you by your health care provider. Make sure you  discuss any questions you have with your health care provider. Document Revised: 03/13/2018 Document Reviewed: 03/13/2018 Elsevier Patient Education  2021 [employer].   IMPORTANT INFORMATION: PAY CLOSE ATTENTION   PHYSICIAN DISCHARGE INSTRUCTIONS  Follow with Primary care provider  Sharilyn Sites, MD  and other consultants as instructed by your Hospitalist Physician  [facility] IF SYMPTOMS COME BACK, WORSEN OR NEW PROBLEM DEVELOPS   Please note: You were cared for by a hospitalist during your hospital stay. Every effort will be made to forward records to your primary care provider.  You can request that your primary care provider send for your hospital records if they have not received them.  Once you are discharged, your primary care physician will handle any further medical issues. Please note that NO REFILLS for any discharge medications will be authorized once you are discharged, as it is imperative that you return to your primary care physician (or establish a relationship with a primary care physician if you do not have one) for your post hospital discharge needs so that they can reassess your need for medications and monitor your lab values.  Please get a complete blood count and chemistry panel checked by your Primary MD at your next visit, and again as instructed by your Primary MD.  Get Medicines reviewed and adjusted: Please take all your medications with you for your next visit with your Primary MD  Laboratory/radiological data: Please request your Primary MD to go over all hospital tests and procedure/radiological results at the follow up, please ask your primary care provider to get all Hospital records sent to his/her office.  In some cases, they will be blood work, cultures and biopsy results pending at the time of your discharge. Please request that your primary care provider follow up on these results.  If you are diabetic, please bring  your blood sugar readings with you to your follow up appointment with primary care.    Please call and make your follow up appointments as soon as possible.    Also Note the following: If you experience worsening of your admission symptoms, develop shortness of breath, life threatening emergency, suicidal or homicidal thoughts you must seek medical attention immediately by calling 911 or calling your MD immediately  if symptoms less severe.  You must read complete instructions/literature along with all the possible adverse reactions/side effects for all the Medicines you take and that have been prescribed to you. Take any new Medicines after you have completely understood and accpet all the possible adverse reactions/side effects.   Do not drive when taking Pain medications or sleeping medications (Benzodiazepines)  Do not take more than prescribed Pain, Sleep and Anxiety Medications. It is not advisable to combine anxiety,sleep and pain medications without talking with your primary care practitioner  Special Instructions: If you have smoked or chewed  Tobacco  in the last 2 yrs please stop smoking, stop any regular Alcohol  and or any Recreational drug use.  Wear Seat belts while driving.  Do not drive if taking any narcotic, mind altering or controlled substances or recreational drugs or alcohol.

## 2020-12-25 NOTE — Discharge Summary (Signed)
Physician Discharge Summary  [name] CZY:[id number] DOB: Nov 13, 1933 DOA: 12/24/2020  PCP: Sharilyn Sites, MD  Admit date: 12/24/2020 Discharge date: 12/25/2020  Admitted From:  HOME  Disposition:  HOME with [facility]   Recommendations for Outpatient Follow-up:  1. Follow up with PCP in 3 days 2. Go to [facility] tomorrow 3. Please monitor PT/INR closely while on antibiotics.   HOME HEALTH RN TO CHECK PT/INR ON 2/22 AND 2/24 AND 2/26 AND REPORT RESULTS TO PCP AND CARDIOLOGY.   Home Health: RN for Wound Care and check PT/INR   Discharge Condition: STABLE   CODE STATUS: FULL  DIET: resume prior home diet    Brief Hospitalization Summary: Please see all hospital notes, images, labs for full details of the hospitalization. ADMISSION HPI:  87 y.o. male with PMH significant for CAD, atrial fibrillation, diet-controlled DM2 versus prediabetes and chronic venous stasis who was in his usual state of health until 2 to 3 months ago when he started getting blisters in his lower extremities bilaterally.  Of note patient lost his wife of several decades 6 months ago and admits he has been sitting with his legs hanging down a lot since her demise.  Patient states he tried to take care of the blistering in his lower extremities by himself but notes that the blisters have split open and he has had wounds on his legs.  Patient is kept his wounds clean and covered.  Of note, patient does not have a PCP, does not have anybody managing his sugars.  Does not take any medications but checks his fingersticks and notes they are 110-120 in the morning.  He is only followed by cardiology for his atrial fibrillation.  Over the past couple of days patient has noted increased weeping of the wounds on his right lower extremity and he thinks it has had a smell so was worried he was infected.  He went to an urgent care center who sent him to the ED.  The urgent care center thought he had exposed tendons  which he does not.  Patient denies any fevers or chills.  No increased tenderness on his lower extremity.  His main concern is increased weeping of crusty yellow fluid.  He also thinks there is a foul odor.  No malaise.  No systemic symptoms at all.  ED Course:  The patient was noted to be afebrile and normotensive.  Laboratory data was revealing only for an absence of leukocytosis.  X-ray of tib-fib was negative for any bony abnormality or OM.  There is some concern of increased erythema so patient was called in for admission for cellulitis.  Hospital Course Pt was admitted for observation.  He was admitted with a presumed mild nonpurulent cellulitis of right lower extremity.  He has chronic venous stasis ulceration wounds on both legs.  He was treated with IV vancomycin.  He says he is going to follow up with [facility] on 12/26/20 (tomorrow).  He has a PCP.  He has a cardiologist.  [place] RN not available to see him on weekend in hospital.  I have made ambulatory referral for wound care at plastic surgery office.  I have ordered home health RN for wound care and for PT/INR testing while he is on doxycycline for 7 days for fully treating any remaining cellulitis in the legs.  He was advised to go to wound care center tomorrow as he had already planned.  He verbalized understanding.  Continue local wound care with wet to dry dressing changes twice daily.  He is stable to discharge home.    Discharge Diagnoses:  Principal Problem:   Cellulitis and abscess of right lower extremity Active Problems:   Atrial fibrillation (HCC)   HTN (hypertension)   Long term current use of anticoagulant therapy   Chronic diastolic heart failure (HCC)   Diabetes mellitus without complication [facility])  Discharge Instructions: Discharge Instructions    Ambulatory referral to Plastic Surgery   Complete by: As directed    Wound care evaluation right leg wound     Allergies as of 12/25/2020      Reactions    Shellfish-derived Products Other (See Comments)   Will cause his gout to get worse, pt states doctor told him to eat no shell fish   Tape    Blistering      Medication List    TAKE these medications   acetaminophen 650 MG CR tablet Commonly known as: TYLENOL Take 650 mg by mouth every 8 (eight) hours as needed for pain.   Alpha-Lipoic Acid 600 MG Caps Take 600 mg by mouth daily.   calcium-vitamin D 500-200 MG-UNIT tablet Commonly known as: OSCAL WITH D Take 1 tablet by mouth.   doxycycline 100 MG capsule Commonly known as: VIBRAMYCIN Take 1 capsule (100 mg total) by mouth 2 (two) times daily for 7 days.   furosemide 20 MG tablet Commonly known as: LASIX Take 1 tablet (20 mg total) by mouth daily.   loratadine 10 MG tablet Commonly known as: CLARITIN Take 10 mg by mouth at bedtime.   metoprolol succinate 50 MG 24 hr tablet Commonly known as: TOPROL-XL Take 1 tablet (50 mg total) by mouth daily. Take with or immediately following a meal.   OMEGA 3 PO Take 1 capsule by mouth daily.   Potassium 99 MG Tabs Take 1 tablet by mouth daily as needed (cramping).   TART CHERRY ADVANCED PO Take 1,200 mg by mouth daily.   warfarin 2.5 MG tablet Commonly known as: COUMADIN Take as directed. If you are unsure how to take this medication, talk to your nurse or doctor. Original instructions: Take 0.5-1 tablets (1.25-2.5 mg total) by mouth See admin instructions. Take 1 to 2 tablets daily as directed by the coumadin clinic; 2.5 mg Mon Wed Fri, 1.25 mg all other days       Follow-up Information    Sharilyn Sites, MD. Schedule an appointment as soon as possible for a visit in 3 day(s).   Specialty: Family Medicine Contact information: [address] [place] [address] [phone]        Sanda Klein, MD .   Specialty: Cardiology Contact information: [address] [place] [address] [place] [address] [phone]              Allergies  Allergen  Reactions  . Shellfish-Derived Products Other (See Comments)    Will cause his gout to get worse, pt states doctor told him to eat no shell fish  . Tape     Blistering   Allergies as of 12/25/2020      Reactions   Shellfish-derived Products Other (See Comments)   Will cause his gout to get worse, pt states doctor told him to eat no shell fish   Tape    Blistering      Medication List    TAKE these medications   acetaminophen 650 MG CR tablet Commonly known as: TYLENOL Take 650 mg by mouth every  8 (eight) hours as needed for pain.   Alpha-Lipoic Acid 600 MG Caps Take 600 mg by mouth daily.   calcium-vitamin D 500-200 MG-UNIT tablet Commonly known as: OSCAL WITH D Take 1 tablet by mouth.   doxycycline 100 MG capsule Commonly known as: VIBRAMYCIN Take 1 capsule (100 mg total) by mouth 2 (two) times daily for 7 days.   furosemide 20 MG tablet Commonly known as: LASIX Take 1 tablet (20 mg total) by mouth daily.   loratadine 10 MG tablet Commonly known as: CLARITIN Take 10 mg by mouth at bedtime.   metoprolol succinate 50 MG 24 hr tablet Commonly known as: TOPROL-XL Take 1 tablet (50 mg total) by mouth daily. Take with or immediately following a meal.   OMEGA 3 PO Take 1 capsule by mouth daily.   Potassium 99 MG Tabs Take 1 tablet by mouth daily as needed (cramping).   TART CHERRY ADVANCED PO Take 1,200 mg by mouth daily.   warfarin 2.5 MG tablet Commonly known as: COUMADIN Take as directed. If you are unsure how to take this medication, talk to your nurse or doctor. Original instructions: Take 0.5-1 tablets (1.25-2.5 mg total) by mouth See admin instructions. Take 1 to 2 tablets daily as directed by the coumadin clinic; 2.5 mg Mon Wed Fri, 1.25 mg all other days       Procedures/Studies: DG Tibia/Fibula Right  Result Date: 12/24/2020 CLINICAL DATA:  87 year old male with LEFT LOWER extremity pain, swelling and infection. EXAM: RIGHT TIBIA AND FIBULA - 2 VIEW  COMPARISON:  None. FINDINGS: There is no evidence of acute fracture, subluxation or dislocation. No radiographic evidence of acute osteomyelitis noted. Soft tissue swelling is present. RIGHT total knee arthroplasty changes are identified. Degenerative changes in the midfoot and moderate plantar calcaneal spur is noted. IMPRESSION: 1. Soft tissue swelling without acute bony abnormality. No radiographic evidence of acute osteomyelitis. 2. Degenerative changes in the midfoot and moderate plantar calcaneal spur. Electronically Signed   By: Margarette Canada M.D.   On: 12/24/2020 10:37      Subjective: Pt without complaints.  Some mild tenderness in right leg wound with manipulation of dressing.   Discharge Exam: Vitals:   12/25/20 0150 12/25/20 0524  BP: 139/75 134/65  Pulse: 60 (!) 59  Resp: 17 18  Temp: 97.7 F (36.5 C) 98.3 F (36.8 C)  SpO2: 100% 97%   Vitals:   12/24/20 1800 12/24/20 2200 12/25/20 0150 12/25/20 0524  BP: (!) 182/80 (!) 162/76 139/75 134/65  Pulse: 62 64 60 (!) 59  Resp: 18 16 17 18   Temp: (!) 97.5 F (36.4 C) 97.6 F (36.4 C) 97.7 F (36.5 C) 98.3 F (36.8 C)  TempSrc: Oral Oral    SpO2: 100% 100% 100% 97%  Weight:      Height:        General: Pt is alert, awake, not in acute distress Cardiovascular: normal S1/S2 +, no rubs, no gallops Respiratory: CTA bilaterally, no wheezing, no rhonchi Abdominal: Soft, NT, ND, bowel sounds + Extremities: no purulent cellulitis seen.      The results of significant diagnostics from this hospitalization (including imaging, microbiology, ancillary and laboratory) are listed below for reference.     Microbiology: Recent Results (from the past 240 hour(s))  SARS CORONAVIRUS 2 (TAT 6-24 HRS) Nasopharyngeal Nasopharyngeal Swab     Status: None   Collection Time: 12/21/20  7:20 AM   Specimen: Nasopharyngeal Swab  Result Value Ref Range Status   SARS Coronavirus  2 NEGATIVE NEGATIVE Final    Comment: (NOTE) SARS-CoV-2 target  nucleic acids are NOT DETECTED.  The SARS-CoV-2 RNA is generally detectable in upper and lower respiratory specimens during the acute phase of infection. Negative results do not preclude SARS-CoV-2 infection, do not rule out co-infections with other pathogens, and should not be used as the sole basis for treatment or other patient management decisions. Negative results must be combined with clinical observations, patient history, and epidemiological information. The expected result is Negative.  Fact Sheet for Patients: SugarRoll.be  Fact Sheet for Healthcare Providers: https://www.woods-mathews.com/  This test is not yet approved or cleared by the [place] FDA and  has been authorized for detection and/or diagnosis of SARS-CoV-2 by FDA under an Emergency Use Authorization (EUA). This EUA will remain  in effect (meaning this test can be used) for the duration of the COVID-19 declaration under Se ction 564(b)(1) of the Act, 21 U.S.C. section 360bbb-3(b)(1), unless the authorization is terminated or revoked sooner.  Performed at [facility], [place] [address], [place], [place] [address]   Blood culture (routine x 2)     Status: None (Preliminary result)   Collection Time: 12/24/20  2:45 PM   Specimen: BLOOD RIGHT WRIST  Result Value Ref Range Status   Specimen Description Peripheral BLOOD RIGHT WRIST  Final   Special Requests   Final    BOTTLES DRAWN AEROBIC AND ANAEROBIC Blood Culture adequate volume Performed at [facility], [place] [address] [place], [place] [address]    Culture PENDING  Incomplete   Report Status PENDING  Incomplete  SARS CORONAVIRUS 2 (TAT 6-24 HRS) Nasopharyngeal Nasopharyngeal Swab     Status: None   Collection Time: 12/24/20  2:55 PM   Specimen: Nasopharyngeal Swab  Result Value Ref Range Status   SARS Coronavirus 2 NEGATIVE NEGATIVE Final    Comment: (NOTE) SARS-CoV-2 target  nucleic acids are NOT DETECTED.  The SARS-CoV-2 RNA is generally detectable in upper and lower respiratory specimens during the acute phase of infection. Negative results do not preclude SARS-CoV-2 infection, do not rule out co-infections with other pathogens, and should not be used as the sole basis for treatment or other patient management decisions. Negative results must be combined with clinical observations, patient history, and epidemiological information. The expected result is Negative.  Fact Sheet for Patients: SugarRoll.be  Fact Sheet for Healthcare Providers: https://www.woods-mathews.com/  This test is not yet approved or cleared by the [place] FDA and  has been authorized for detection and/or diagnosis of SARS-CoV-2 by FDA under an Emergency Use Authorization (EUA). This EUA will remain  in effect (meaning this test can be used) for the duration of the COVID-19 declaration under Se ction 564(b)(1) of the Act, 21 U.S.C. section 360bbb-3(b)(1), unless the authorization is terminated or revoked sooner.  Performed at [facility], [place] [address] [place], [place] [address]   Blood culture (routine x 2)     Status: None (Preliminary result)   Collection Time: 12/24/20  2:56 PM   Specimen: Left Antecubital; Blood  Result Value Ref Range Status   Specimen Description Peripheral LEFT ANTECUBITAL  Final   Special Requests   Final    BOTTLES DRAWN AEROBIC AND ANAEROBIC Blood Culture adequate volume Performed at [facility], [place] [address] [place], [place] [address]    Culture PENDING  Incomplete   Report Status PENDING  Incomplete     Labs: BNP (last 3 results) No  results for input(s): BNP in the last 8760 hours. Basic Metabolic Panel: Recent Labs  Lab 12/24/20 1454 12/25/20 0526  NA 137 137  K 3.7 3.8  CL 105 105  CO2 25 23  GLUCOSE 136* 118*  BUN 28* 26*  CREATININE 1.21 1.05   CALCIUM 8.6* 8.2*   Liver Function Tests: Recent Labs  Lab 12/24/20 1454  AST 20  ALT 16  ALKPHOS 36*  BILITOT 1.2  PROT 6.3*  ALBUMIN 3.5   No results for input(s): LIPASE, AMYLASE in the last 168 hours. No results for input(s): AMMONIA in the last 168 hours. CBC: Recent Labs  Lab 12/24/20 1454 12/25/20 0526  WBC 5.8 6.0  NEUTROABS 3.8  --   HGB 12.3* 12.3*  HCT 38.6* 39.0  MCV 99.2 99.0  PLT 146* 133*   Cardiac Enzymes: No results for input(s): CKTOTAL, CKMB, CKMBINDEX, TROPONINI in the last 168 hours. BNP: Invalid input(s): POCBNP CBG: No results for input(s): GLUCAP in the last 168 hours. D-Dimer No results for input(s): DDIMER in the last 72 hours. Hgb A1c No results for input(s): HGBA1C in the last 72 hours. Lipid Profile No results for input(s): CHOL, HDL, LDLCALC, TRIG, CHOLHDL, LDLDIRECT in the last 72 hours. Thyroid function studies No results for input(s): TSH, T4TOTAL, T3FREE, THYROIDAB in the last 72 hours.  Invalid input(s): FREET3 Anemia work up No results for input(s): VITAMINB12, FOLATE, FERRITIN, TIBC, IRON, RETICCTPCT in the last 72 hours. Urinalysis    Component Value Date/Time   COLORURINE YELLOW 09/25/2013 1130   APPEARANCEUR CLEAR 09/25/2013 1130   LABSPEC 1.031 (H) 09/25/2013 1130   PHURINE 5.0 09/25/2013 1130   GLUCOSEU NEGATIVE 09/25/2013 1130   HGBUR NEGATIVE 09/25/2013 1130   BILIRUBINUR NEGATIVE 09/25/2013 1130   KETONESUR NEGATIVE 09/25/2013 1130   PROTEINUR NEGATIVE 09/25/2013 1130   UROBILINOGEN 0.2 09/25/2013 1130   NITRITE NEGATIVE 09/25/2013 1130   LEUKOCYTESUR NEGATIVE 09/25/2013 1130   Sepsis Labs Invalid input(s): PROCALCITONIN,  WBC,  LACTICIDVEN Microbiology Recent Results (from the past 240 hour(s))  SARS CORONAVIRUS 2 (TAT 6-24 HRS) Nasopharyngeal Nasopharyngeal Swab     Status: None   Collection Time: 12/21/20  7:20 AM   Specimen: Nasopharyngeal Swab  Result Value Ref Range Status   SARS Coronavirus 2  NEGATIVE NEGATIVE Final    Comment: (NOTE) SARS-CoV-2 target nucleic acids are NOT DETECTED.  The SARS-CoV-2 RNA is generally detectable in upper and lower respiratory specimens during the acute phase of infection. Negative results do not preclude SARS-CoV-2 infection, do not rule out co-infections with other pathogens, and should not be used as the sole basis for treatment or other patient management decisions. Negative results must be combined with clinical observations, patient history, and epidemiological information. The expected result is Negative.  Fact Sheet for Patients: SugarRoll.be  Fact Sheet for Healthcare Providers: https://www.woods-mathews.com/  This test is not yet approved or cleared by the [place] FDA and  has been authorized for detection and/or diagnosis of SARS-CoV-2 by FDA under an Emergency Use Authorization (EUA). This EUA will remain  in effect (meaning this test can be used) for the duration of the COVID-19 declaration under Se ction 564(b)(1) of the Act, 21 U.S.C. section 360bbb-3(b)(1), unless the authorization is terminated or revoked sooner.  Performed at [facility], [place] [address], [place], [place] [address]   Blood culture (routine x 2)     Status: None (Preliminary result)   Collection Time: 12/24/20  2:45 PM   Specimen: BLOOD RIGHT  WRIST  Result Value Ref Range Status   Specimen Description Peripheral BLOOD RIGHT WRIST  Final   Special Requests   Final    BOTTLES DRAWN AEROBIC AND ANAEROBIC Blood Culture adequate volume Performed at [facility], [place] [address], [place], [place] [address]    Culture PENDING  Incomplete   Report Status PENDING  Incomplete  SARS CORONAVIRUS 2 (TAT 6-24 HRS) Nasopharyngeal Nasopharyngeal Swab     Status: None   Collection Time: 12/24/20  2:55 PM   Specimen: Nasopharyngeal Swab  Result Value Ref Range Status   SARS Coronavirus 2  NEGATIVE NEGATIVE Final    Comment: (NOTE) SARS-CoV-2 target nucleic acids are NOT DETECTED.  The SARS-CoV-2 RNA is generally detectable in upper and lower respiratory specimens during the acute phase of infection. Negative results do not preclude SARS-CoV-2 infection, do not rule out co-infections with other pathogens, and should not be used as the sole basis for treatment or other patient management decisions. Negative results must be combined with clinical observations, patient history, and epidemiological information. The expected result is Negative.  Fact Sheet for Patients: SugarRoll.be  Fact Sheet for Healthcare Providers: https://www.woods-mathews.com/  This test is not yet approved or cleared by the [place] FDA and  has been authorized for detection and/or diagnosis of SARS-CoV-2 by FDA under an Emergency Use Authorization (EUA). This EUA will remain  in effect (meaning this test can be used) for the duration of the COVID-19 declaration under Se ction 564(b)(1) of the Act, 21 U.S.C. section 360bbb-3(b)(1), unless the authorization is terminated or revoked sooner.  Performed at [facility], [place] [address], [place], [place] [address]   Blood culture (routine x 2)     Status: None (Preliminary result)   Collection Time: 12/24/20  2:56 PM   Specimen: Left Antecubital; Blood  Result Value Ref Range Status   Specimen Description Peripheral LEFT ANTECUBITAL  Final   Special Requests   Final    BOTTLES DRAWN AEROBIC AND ANAEROBIC Blood Culture adequate volume Performed at [facility], [place] [address], [place], [place] [address]    Culture PENDING  Incomplete   Report Status PENDING  Incomplete    Time coordinating discharge:   SIGNED:  Irwin Brakeman, MD  [facility] 12/25/2020, 11:03 AM How to contact the [facility] Attending or Consulting provider [place] or covering provider during  after hours [place], for this patient?  1. Check the care team in [facility] and look for a) attending/consulting [facility] provider listed and b) the [facility] team listed 2. Log into [email or web address] and use [place]'s universal password to access. If you do not have the password, please contact the hospital operator. 3. Locate the [facility] provider you are looking for under [facility] and page to a number that you can be directly reached. 4. If you still have difficulty reaching the provider, please page the [facility] (Director on Call) for the Hospitalists listed on amion for assistance.

## 2020-12-25 NOTE — Consult Note (Signed)
[facility] Nurse Consult Note: Reason for Consult: RLE full thickness wounds.  Patient is being discharged today and will follow up in the outpatient [facility] tomorrow. NS moistened gauze dressings are to be placed twice daily in the interim.  [facility] nursing team will not follow, but will remain available to this patient, the nursing and medical teams.  Please re-consult if needed. Thanks, Maudie Flakes, MSN, RN, [place], [facility]  Pager# [phone]

## 2020-12-26 ENCOUNTER — Encounter (HOSPITAL_COMMUNITY): Payer: Self-pay | Admitting: Ophthalmology

## 2020-12-30 ENCOUNTER — Telehealth: Payer: Self-pay | Admitting: Internal Medicine

## 2020-12-30 LAB — CULTURE, BLOOD (ROUTINE X 2)
Culture: NO GROWTH
Culture: NO GROWTH
Special Requests: ADEQUATE
Special Requests: ADEQUATE

## 2021-01-02 NOTE — H&P (Signed)
Surgical History & Physical  Patient Name: [name] DOB: 04-02-1934  Surgery: Cataract extraction with intraocular lens implant phacoemulsification; Right Eye  Surgeon: [name] MD Surgery Date:  01/09/2021 Pre-Op Date:  12/29/2020  HPI: A 77 Yr. old male patient PO-OS/Pre op OD The patient is returning after cataract surgery. The left eye is affected. Status post cataract surgery, which began 1 week ago: Since the last visit, the affected area is doing well. The patient's vision is improved and stable. Patient is following medication instructions. Omni TID OS. The patient experiences no increase in flashes, floater, shadow, curtain or veil. The patient complains of difficulty when driving at night, which began 3 ago. The right eye is affected. The episode is gradual. The condition's severity increased since last visit. Symptoms occur when the patient is driving, outside and reading. This is negatively affecting the patient's quality of life. HPI was performed by [name] .  Medical History: Cataracts Diabetes Gout Heart Problem  Review of Systems Negative Allergic/Immunologic Negative Cardiovascular Negative Constitutional Negative Ear, Nose, Mouth & Throat Negative Endocrine Negative Eyes Negative Gastrointestinal Negative Genitourinary Negative Hemotologic/Lymphatic Negative Integumentary Negative Musculoskeletal Negative Neurological Negative Psychiatry Negative Respiratory  Social   Never smoked  Medication Prednisolone-gatiflox-bromfenac,  Allopurinol, Metoprolol, Pantoprazole, Warfarin,   Sx/Procedures Phaco c IOL OS,  Appendectomy, Knee Replacement, Pacemaker,   Drug Allergies   NKDA  History & Physical: Heent:  Cataract, Right eye NECK: supple without bruits LUNGS: lungs clear to auscultation CV: regular rate and rhythm Abdomen: soft and non-tender  Impression & Plan: Assessment: 1.  CATARACT EXTRACTION STATUS; Left Eye (Z98.42) 2.   INTRAOCULAR LENS IOL (Z96.1) 3.  NUCLEAR SCLEROSIS AGE RELATED; , Right Eye (H25.11) 4.  Presbyopia ; Both Eyes (H52.4)  Plan: 1.  1 week after cataract surgery. Doing well with improved vision and normal eye pressure. Call with any problems or concerns. Continue Pred-Moxi-Brom 2x/day for 3 more weeks. 2.  Doing well since surgery Continue Post-op medications 3.  Cataract accounts for the patient's decreased vision. This visual impairment is not correctable with a tolerable change in glasses or contact lenses. Cataract surgery with an implantation of a new lens should significantly improve the visual and functional status of the patient. Discussed all risks, benefits, alternatives, and potential complications. Discussed the procedures and recovery. Patient desires to have surgery. A-scan ordered and performed today for intra-ocular lens calculations. The surgery will be performed in order to improve vision for driving, reading, and for eye examinations. Recommend phacoemulsification with intra-ocular lens. Recommend Dextenza for post-operative pain and inflammation. Right Eye. Surgery required to correct imbalance of vision. Dilates well - shugarcaine by protocol.

## 2021-01-03 ENCOUNTER — Encounter (HOSPITAL_COMMUNITY): Payer: Self-pay | Admitting: Physical Therapy

## 2021-01-03 ENCOUNTER — Other Ambulatory Visit: Payer: Self-pay

## 2021-01-03 ENCOUNTER — Ambulatory Visit (HOSPITAL_COMMUNITY): Payer: Medicare Other | Attending: Internal Medicine | Admitting: Physical Therapy

## 2021-01-03 DIAGNOSIS — M79604 Pain in right leg: Secondary | ICD-10-CM | POA: Diagnosis present

## 2021-01-03 DIAGNOSIS — S81801A Unspecified open wound, right lower leg, initial encounter: Secondary | ICD-10-CM | POA: Insufficient documentation

## 2021-01-03 DIAGNOSIS — R262 Difficulty in walking, not elsewhere classified: Secondary | ICD-10-CM | POA: Insufficient documentation

## 2021-01-03 DIAGNOSIS — S81802A Unspecified open wound, left lower leg, initial encounter: Secondary | ICD-10-CM | POA: Diagnosis present

## 2021-01-03 NOTE — Therapy (Signed)
[place], [place], [address] Phone: [phone]   Fax:  [phone]  Wound Care Evaluation  Patient Details  Name: [name] MRN: [id number] Date of Birth: 1934/08/16 Referring Provider (PT): Jeanella Craze, DO   Encounter Date: 01/03/2021   PT End of Session - 01/03/21 1536    Visit Number 1    Number of Visits 16    Date for PT Re-Evaluation 02/28/21    Authorization Type [employer] medicare, no auth or VL    Progress Note Due on Visit 10    PT Start Time 1402    PT Stop Time 1520    PT Time Calculation (min) 78 min    Activity Tolerance Patient limited by pain    Behavior During Therapy [facility] for tasks assessed/performed           Past Medical History:  Diagnosis Date  . Arthritis   . Atrial fibrillation ([place])   . Barrett's esophagus   . CAD (coronary artery disease)    echo 09-05-2010-EF nl, mod-severe dilated Left atrium; myoview 02-08-12-EF 52%, no ischemia  . Cancer [facility])    prostate - radiation only  . Diabetes mellitus without complication ([place])    "prediabetic"-no oral meds  . GERD (gastroesophageal reflux disease)   . Gout   . H/O transesophageal echocardiography (TEE) for monitoring 09/05/10   tee guided AT pace termination, did not proceed with cardioversion due to termination of the atrial flutter through his pacemaker  . History of cardioversion 02/09/13   electrical synchronized cardioversion, on flecainide and warfarin  . History of kidney stones    per pt - no actual diagnosisi  . History of stomach ulcers    many yrs ago  . HTN (hypertension)   . Pacemaker    medtronic  . Pacemaker 09-25-13  . Prostate cancer ([place]) 09-25-13   tx. with radiation only- dx. 2013    Past Surgical History:  Procedure Laterality Date  . APPENDECTOMY    . CARDIAC CATHETERIZATION  02/18/06   EF 65%, focal napkin ring-like lesion into the prox LAD otherwise normal coronaries  . CARDIOVERSION N/A 02/09/2013    Procedure: CARDIOVERSION;  Surgeon: Sanda Klein, MD;  Location: [place] ENDOSCOPY;  Service: Cardiovascular;  Laterality: N/A;  . CATARACT EXTRACTION W/PHACO Left 12/23/2020   Procedure: CATARACT EXTRACTION PHACO AND INTRAOCULAR LENS PLACEMENT ([place]);  Surgeon: [name], MD;  Location: [facility];  Service: Ophthalmology;  Laterality: Left;  CDE 18.55  . PACEMAKER GENERATOR CHANGE N/A 07/27/2014   Procedure: PACEMAKER GENERATOR CHANGE;  Surgeon: Sanda Klein, MD;  Location: [place] [facility] CATH LAB;  Service: Cardiovascular;  Laterality: N/A;  . PACEMAKER INSERTION     medtronic adapta- Dr. Sallyanne Kuster follows- LOV 9'14  . TONSILLECTOMY    . TOTAL KNEE ARTHROPLASTY Right 10/05/2013   Procedure: RIGHT TOTAL KNEE ARTHROPLASTY;  Surgeon: Gearlean Alf, MD;  Location: [facility];  Service: Orthopedics;  Laterality: Right;    There were no vitals filed for this visit.     [facility] PT Assessment - 01/03/21 0001      Assessment   Medical Diagnosis wounds on right leg    Referring Provider (PT) Pratick Manuella Ghazi, DO    Prior Therapy no      Balance Screen   Has the patient fallen in the past 6 months No      [place] residence    Living Arrangements Alone    Available  Help at Discharge Family   daughter checks on him after work   [employer] loss adjuster - [employer]      Prior Function   Level of Independence Independent      Cognition   Overall Cognitive Status Within Functional Limits for tasks assessed      Observation/Other Assessments-Edema    Edema --   pitting edema bilaterally 4+ in both legs          Wound Therapy - 01/03/21 0001    Subjective Patient states that his blisters started about 3 months and they were healing but then they got infected. States the spot on the back of his leg was not a blister but instead a spot where he hit his leg. States that he has been treating it with Neosporin and keeping it covered. He went to urgent care last week and they  sent him to the ED. They gave him a weeks worth of antibiotics and a referral to the wound clinic. Patient reports the wounds are painful with walking. He has been changing the dressings daily and notices a moderate amount of drainage. States he lives alone and does not use a cane at baseline but has one at home.    Patient and Family Stated Goals to have wounds to heal    Date of Onset --   3 months ago   Prior Treatments neosporin, gauze, antibitics    Pain Scale Faces    Faces Pain Scale Hurts whole lot    Pain Type Acute pain    Pain Location Leg    Pain Orientation Right;Left    Pain Descriptors / Indicators Sharp    Pain Onset With Activity   with debridemnet,   Pain Intervention(s) Emotional support    Evaluation and Treatment Procedures Explained to Patient/Family Yes    Evaluation and Treatment Procedures agreed to    Wound Properties Date First Assessed: 01/03/21 Time First Assessed: 1415 Wound Type: Venous stasis ulcer Location: Pretibial Location Orientation: Left;Proximal Wound Description (Comments): left anterior wound Present on Admission: Yes   Dressing Type Gauze (Comment)   neosporin   Dressing Changed Changed    Dressing Status Old drainage    Dressing Change Frequency PRN    Site / Wound Assessment Pale;Pink    % Wound base Red or Granulating 100%    % Wound base Yellow/Fibrinous Exudate 0%    Peri-wound Assessment Edema;Hemosiderin    Wound Length (cm) 1 cm    Wound Width (cm) 2 cm    Wound Depth (cm) 0 cm    Wound Volume (cm^3) 0 cm^3    Wound Surface Area (cm^2) 2 cm^2    Margins Attached edges (approximated)    Drainage Amount Moderate    Drainage Description Serous    Treatment Cleansed    Wound Properties Date First Assessed: 01/03/21 Time First Assessed: 1430 Wound Type: Venous stasis ulcer Location: Pretibial Location Orientation: Proximal;Right Wound Description (Comments): right upper shin blister Present on Admission: Yes   Wound Image View All Images  View Images    Dressing Type Gauze (Comment)   neosporin   Dressing Changed Changed    Dressing Status Old drainage    Dressing Change Frequency PRN    Site / Wound Assessment Red;Pink    % Wound base Red or Granulating 100%    % Wound base Yellow/Fibrinous Exudate 0%    Peri-wound Assessment Edema   dry skin   Wound Length (cm) 2.5 cm  Wound Width (cm) 2 cm    Wound Depth (cm) 0 cm    Wound Volume (cm^3) 0 cm^3    Wound Surface Area (cm^2) 5 cm^2    Margins Attached edges (approximated)    Drainage Amount Moderate    Drainage Description Serous    Treatment Cleansed;Debridement (Selective)    Wound Properties Date First Assessed: 01/03/21 Time First Assessed: 1430 Wound Type: Diabetic ulcer Location: Pretibial Location Orientation: Distal;Right Wound Description (Comments): right lower wound Present on Admission: Yes   Wound Image View All Images View Images    Dressing Type Gauze (Comment)   neosporin   Dressing Changed Changed    Dressing Status Old drainage    Dressing Change Frequency PRN    Site / Wound Assessment Pale;Red;Yellow;Black;Painful    % Wound base Red or Granulating 5%    % Wound base Yellow/Fibrinous Exudate 90%    % Wound base Black/Eschar 5%    Peri-wound Assessment Edema;Erythema (blanchable)   dry skin   Wound Length (cm) 5.2 cm    Wound Width (cm) 4.3 cm    Wound Depth (cm) 0.4 cm    Wound Volume (cm^3) 8.94 cm^3    Wound Surface Area (cm^2) 22.36 cm^2    Margins Unattached edges (unapproximated)    Drainage Amount Moderate    Drainage Description Serosanguineous    Treatment Cleansed;Debridement (Selective)    Wound Properties Date First Assessed: 01/03/21 Wound Type: Diabetic ulcer Location: Tibial Location Orientation: Distal;Posterior;Right Wound Description (Comments): right posterior wound Present on Admission: Yes   Wound Image View All Images View Images    Dressing Type Gauze (Comment)   neosporin   Dressing Changed Changed    Dressing  Status Old drainage    Dressing Change Frequency PRN    Site / Wound Assessment Yellow;Red;Pink;Pale;Painful    % Wound base Yellow/Fibrinous Exudate 100%    Peri-wound Assessment Edema   dry skin   Wound Length (cm) 4.1 cm    Wound Width (cm) 4.3 cm    Wound Depth (cm) 0.3 cm    Wound Volume (cm^3) 5.29 cm^3    Wound Surface Area (cm^2) 17.63 cm^2    Margins Unattached edges (unapproximated)    Drainage Amount Moderate    Drainage Description Serosanguineous    Treatment Cleansed;Debridement (Selective)    Selective Debridement - Location to right wounds    Selective Debridement - Tools Used Forceps;Scalpel    Selective Debridement - Tissue Removed devitalized tissue, necrotic tissue.    Wound Therapy - Clinical Statement Patient presents to therapy with 3 wounds on the right lower leg and one blister on the left leg. He also presents with small scabs over both legs. Patient has a-fib and a pacemaker and is on anticoagulants, reports they told him he was diabetic at the hospital but is not currently monitoring his blood sugars, and has pitting edema 4+ in both legs. Wounds currently limit patient's ability to walk and cause pain with all activities. Educated patient on wound care and how it can help promote wound healing. Observed reduced hair, dry skin and cool toes bilateral, given history of heart condition, concerned that there may be arterial insufficiency as well as venous insufficiency. Will contact referring MD to determine if ABI is warranted. Wrapped in profore lite with thorough education on when and how to properly doff dressings if need be. Dressed slough-based wounds with medihoney and granulated wounds with xeroform. Patient would greatly benefit from skilled physical therapy to improve overall function  and quality of life.    Wound Therapy - Functional Problem List difficulty walking, bathing    Factors Delaying/Impairing Wound Healing Vascular compromise;Infection -  systemic/local;Diabetes Mellitus   recent infection, heart disease   Wound Therapy - Frequency 2X / week    Wound Therapy - Current Recommendations PT    Wound Plan dressing changes and debridement of wounds. CAUTION - patient on anticoagulents, allergic to adhesive (no tape on skin). following up with MD about obtaining ABI    Dressing  left leg: - ORDER for right leg only - no debridement on left- lotion, vaseoline, xeroform, alginate on xeroform, profore lite #5 netting    Dressing R leg: lotion, vaseoline, xeroform to superior blister, medihoney to gauze to wounds with slough, covered with alginate al wound were weaping, profore lite, #5 netting                 Objective measurements completed on examination: See above findings.             PT Education - 01/03/21 1535    Education Details on when and how to doff dressings, on compression therapy and benefits, on using cane with walking    Person(s) Educated Patient    Methods Explanation    Comprehension Verbalized understanding            PT Short Term Goals - 01/03/21 1552      PT SHORT TERM GOAL #1   Title Wound on left leg will be completely healed to reduce risk of infection.    Time 4    Period Weeks    Status New    Target Date 01/31/21      PT SHORT TERM GOAL #2   Title Anterior lower wound on right leg will be 100% granulated to deomstrate improved wound healing.    Time 4    Period Weeks    Status New    Target Date 01/31/21      PT SHORT TERM GOAL #3   Title Posterior  wound on right leg will be 100% granulated to demonstrate improved wound healing.    Time 4    Period Weeks    Status New    Target Date 01/31/21      PT SHORT TERM GOAL #4   Title Patient will report importance of wearing daily compression garments to reduce swelling and risk of forming blisters    Time 4    Period Weeks    Status New    Target Date 02/28/21             PT Long Term Goals - 01/03/21 1553       PT LONG TERM GOAL #1   Time 8    Period Weeks    Status New    Target Date 02/28/21      PT LONG TERM GOAL #2   Title Anterior lower wound on right leg will be completely healed to reduce risk of infection.    Time 8    Period Weeks    Status New    Target Date 02/28/21      PT LONG TERM GOAL #3   Title Posterior wound on right leg will be completely healed to reduce risk of infection.    Time 8    Period Weeks    Status New    Target Date 02/28/21      PT LONG TERM GOAL #4   Title Patietn will report <3/10 pain  in legs due to wounds to reduce pain with walking                Plan - 01/03/21 1536    Personal Factors and Comorbidities Comorbidity 1;Comorbidity 2;Comorbidity 3+    Comorbidities pacemaker, afib, on anticoagulents, vaicose veins.    Examination-Activity Limitations Locomotion Level;Hygiene/Grooming;Stand    Examination-Participation Restrictions Community Activity    Stability/Clinical Decision Making Stable/Uncomplicated    Clinical Decision Making Low           Patient will benefit from skilled therapeutic intervention in order to improve the following deficits and impairments:  Other (comment),Pain,Increased edema,Decreased skin integrity,Decreased mobility,Decreased activity tolerance (wounds)  Visit Diagnosis: Difficulty in walking, not elsewhere classified  Open leg wound, left, initial encounter  Leg wound, right, initial encounter    Problem List Patient Active Problem List   Diagnosis Date Noted  . Cellulitis and abscess of right lower extremity 12/24/2020  . Diabetes mellitus without complication ([place])   . SSS (sick sinus syndrome) ([place]) 07/27/2014  . Pacemaker syndrome 07/27/2014  . Tachy-brady syndrome ([place]) 07/27/2014  . Difficulty in walking(719.7) 11/03/2013  . Stiffness of right knee 11/03/2013  . Postoperative anemia due to acute blood loss 10/06/2013  . OA (osteoarthritis) of knee 10/05/2013  . Preoperative  cardiovascular examination 07/20/2013  . Chronic diastolic heart failure ([place]) 04/29/2013  . Long QT interval 04/21/2013  . Volume overload 04/21/2013  . Long term current use of anticoagulant therapy 01/09/2013  . HTN (hypertension) 05/20/2011  . Pleural effusion 05/20/2011  . Lactic acid acidosis 05/18/2011  . Pacemaker 05/18/2011  . Anemia 05/18/2011  . Acute renal failure (ARF) ([place]) 05/18/2011  . Pneumonia 05/17/2011  . Fever 05/17/2011  . Atrial fibrillation ([place]) 05/17/2011  . Hyperglycemia 05/17/2011  . Bradycardia 05/17/2011  . Gout 05/17/2011  . Arthritis 05/17/2011  . Thrombocytopenia ([place]) 05/17/2011   4:43 PM, 01/03/21 Jerene Pitch, DPT Physical Therapy with [facility]  [phone] office  [place] [address] [place], [place], [address] Phone: [phone]   Fax:  [phone]  Name: CORLEONE BIEGLER MRN: [id number] Date of Birth: 1934-04-28

## 2021-01-04 ENCOUNTER — Encounter (HOSPITAL_COMMUNITY)
Admission: RE | Admit: 2021-01-04 | Discharge: 2021-01-04 | Disposition: A | Discharge status: Home / Self Care | Payer: Medicare Other | Source: Ambulatory Visit | Attending: Ophthalmology | Admitting: Ophthalmology

## 2021-01-05 ENCOUNTER — Other Ambulatory Visit: Payer: Self-pay | Admitting: *Deleted

## 2021-01-05 ENCOUNTER — Other Ambulatory Visit: Payer: Self-pay

## 2021-01-05 ENCOUNTER — Ambulatory Visit (HOSPITAL_COMMUNITY): Payer: Medicare Other | Admitting: Physical Therapy

## 2021-01-05 ENCOUNTER — Encounter (HOSPITAL_COMMUNITY): Payer: Self-pay | Admitting: Physical Therapy

## 2021-01-05 DIAGNOSIS — R262 Difficulty in walking, not elsewhere classified: Secondary | ICD-10-CM | POA: Diagnosis not present

## 2021-01-05 DIAGNOSIS — S81802A Unspecified open wound, left lower leg, initial encounter: Secondary | ICD-10-CM

## 2021-01-05 DIAGNOSIS — I739 Peripheral vascular disease, unspecified: Secondary | ICD-10-CM

## 2021-01-05 DIAGNOSIS — S81801A Unspecified open wound, right lower leg, initial encounter: Secondary | ICD-10-CM

## 2021-01-05 NOTE — Therapy (Signed)
[place], [place], [address] Phone: [phone]   Fax:  [phone]  Wound Care Therapy  Patient Details  Name: [name] MRN: [id number] Date of Birth: [birth year] Referring Provider (PT): Jeanella Craze, DO   Encounter Date: 01/05/2021   PT End of Session - 01/05/21 1114    Visit Number 2    Number of Visits 16    Date for PT Re-Evaluation 02/28/21    Authorization Type UHC medicare, no auth or VL    Progress Note Due on Visit 10    PT Start Time 1000    PT Stop Time 1114    PT Time Calculation (min) 74 min    Activity Tolerance Patient limited by pain    Behavior During Therapy [facility] for tasks assessed/performed           Past Medical History:  Diagnosis Date  . Arthritis   . Atrial fibrillation ([place])   . Barrett's esophagus   . CAD (coronary artery disease)    echo 09-05-2010-EF nl, mod-severe dilated Left atrium; myoview 02-08-12-EF 52%, no ischemia  . Cancer [facility])    prostate - radiation only  . Diabetes mellitus without complication ([place])    "prediabetic"-no oral meds  . GERD (gastroesophageal reflux disease)   . Gout   . H/O transesophageal echocardiography (TEE) for monitoring 09/05/10   tee guided AT pace termination, did not proceed with cardioversion due to termination of the atrial flutter through his pacemaker  . History of cardioversion 02/09/13   electrical synchronized cardioversion, on flecainide and warfarin  . History of kidney stones    per pt - no actual diagnosisi  . History of stomach ulcers    many yrs ago  . HTN (hypertension)   . Pacemaker    medtronic  . Pacemaker 09-25-13  . Prostate cancer ([place]) 09-25-13   tx. with radiation only- dx. 2013    Past Surgical History:  Procedure Laterality Date  . APPENDECTOMY    . CARDIAC CATHETERIZATION  02/18/06   EF 65%, focal napkin ring-like lesion into the prox LAD otherwise normal coronaries  . CARDIOVERSION N/A 02/09/2013    Procedure: CARDIOVERSION;  Surgeon: Sanda Klein, MD;  Location: [facility];  Service: Cardiovascular;  Laterality: N/A;  . CATARACT EXTRACTION W/PHACO Left 12/23/2020   Procedure: CATARACT EXTRACTION PHACO AND INTRAOCULAR LENS PLACEMENT ([place]);  Surgeon: [name], MD;  Location: [facility];  Service: Ophthalmology;  Laterality: Left;  CDE 18.55  . PACEMAKER GENERATOR CHANGE N/A 07/27/2014   Procedure: PACEMAKER GENERATOR CHANGE;  Surgeon: Sanda Klein, MD;  Location: [place] CATH LAB;  Service: Cardiovascular;  Laterality: N/A;  . PACEMAKER INSERTION     medtronic adapta- Dr. Sallyanne Kuster follows- LOV 9'14  . TONSILLECTOMY    . TOTAL KNEE ARTHROPLASTY Right 10/05/2013   Procedure: RIGHT TOTAL KNEE ARTHROPLASTY;  Surgeon: Gearlean Alf, MD;  Location: [facility];  Service: Orthopedics;  Laterality: Right;    There were no vitals filed for this visit.      [facility] PT Assessment - 01/05/21 0001      Assessment   Medical Diagnosis wounds on right leg    Referring Provider (PT) Pratick Manuella Ghazi, DO                   Wound Therapy - 01/05/21 0001    Subjective States his right leg throbbed the first night but otherwise was fine. States compression was OK    Patient  and Family Stated Goals to have wounds to heal    Date of Onset --   3 months ago   Prior Treatments neosporin, gauze, antibitics    Pain Scale Faces    Pain Score 10-Worst pain ever    Faces Pain Scale Hurts whole lot    Pain Type Acute pain    Pain Location Leg    Pain Orientation Right    Pain Descriptors / Indicators Sharp    Pain Onset With Activity    Pain Intervention(s) Emotional support    Evaluation and Treatment Procedures Explained to Patient/Family Yes    Evaluation and Treatment Procedures agreed to    Wound Properties Date First Assessed: 01/03/21 Time First Assessed: 1415 Wound Type: Venous stasis ulcer Location: Pretibial Location Orientation: Left;Proximal Wound Description (Comments): left anterior wound  Present on Admission: Yes   Dressing Type Compression wrap;Gauze (Comment);Impregnated gauze (bismuth)   aliginate,   Dressing Changed Changed    Dressing Status Old drainage    Dressing Change Frequency PRN    Site / Wound Assessment Pink;Red    % Wound base Red or Granulating 100%    % Wound base Yellow/Fibrinous Exudate 0%    Peri-wound Assessment Edema;Maceration    Margins Attached edges (approximated)    Drainage Amount Moderate    Drainage Description Serous    Treatment Cleansed    Wound Properties Date First Assessed: 01/03/21 Time First Assessed: 1430 Wound Type: Venous stasis ulcer Location: Pretibial Location Orientation: Proximal;Right Wound Description (Comments): right upper shin blister Present on Admission: Yes   Dressing Type Compression wrap;Gauze (Comment);Impregnated gauze (bismuth)   medihoney on gauze, xeroform   Dressing Changed Changed    Dressing Status Old drainage    Dressing Change Frequency PRN    Site / Wound Assessment Pink;Pale    % Wound base Red or Granulating 90%    % Wound base Yellow/Fibrinous Exudate 10%    Peri-wound Assessment Edema;Maceration    Margins Attached edges (approximated)    Drainage Amount Moderate    Drainage Description Serous    Treatment Cleansed;Debridement (Selective)    Wound Properties Date First Assessed: 01/03/21 Time First Assessed: 1430 Wound Type: Diabetic ulcer Location: Pretibial Location Orientation: Distal;Right Wound Description (Comments): right lower wound Present on Admission: Yes   Dressing Type Compression wrap;Impregnated gauze (bismuth);Gauze (Comment)   medihoney on gauze   Dressing Changed Changed    Dressing Status Old drainage    Dressing Change Frequency PRN    Site / Wound Assessment Yellow;Red;Granulation tissue;Painful    % Wound base Red or Granulating 20%    % Wound base Yellow/Fibrinous Exudate 75%    % Wound base Black/Eschar 5%    Peri-wound Assessment Edema;Erythema (blanchable)    Margins  Unattached edges (unapproximated)    Drainage Amount Moderate    Drainage Description Serosanguineous    Treatment Cleansed;Debridement (Selective)    Wound Properties Date First Assessed: 01/03/21 Wound Type: Diabetic ulcer Location: Tibial Location Orientation: Distal;Posterior;Right Wound Description (Comments): right posterior wound Present on Admission: Yes   Dressing Type Compression wrap;Impregnated gauze (bismuth);Gauze (Comment)   medihoney   Dressing Changed Changed    Dressing Status Old drainage    Dressing Change Frequency PRN    Site / Wound Assessment Yellow;Red;Granulation tissue;Painful    % Wound base Red or Granulating 25%    % Wound base Yellow/Fibrinous Exudate 75%    Peri-wound Assessment Edema;Erythema (blanchable)    Margins Unattached edges (unapproximated)    Drainage Amount  Moderate    Drainage Description Serosanguineous    Treatment Cleansed;Debridement (Selective)    Selective Debridement - Location to right wounds    Selective Debridement - Tools Used Forceps;Scalpel    Selective Debridement - Tissue Removed devitalized tissue, necrotic tissue.    Wound Therapy - Clinical Statement Granulation tissue increasing in wound beds on right leg. Continued to have severe pain with debridement. Toes warm with compression garments on and off. Discussed PT following up with Cardiac MD about potential benefits of ABI and patient agreed. Continued with debridement as tolerated though right leg was very painful. Continued with medihoney followed by alginate on slough based wounds and xeroform followed by alginate on more granulated wounds. Continued with profore lite, and overall edema looks improved on this date. Will continue with current POC as tolerated.    Wound Therapy - Functional Problem List difficulty walking, bathing    Factors Delaying/Impairing Wound Healing Vascular compromise;Infection - systemic/local;Diabetes Mellitus   recent infection, heart disease   Wound  Therapy - Frequency 2X / week    Wound Therapy - Current Recommendations PT    Wound Plan dressing changes and debridement of wounds. CAUTION - patient on anticoagulents, allergic to adhesive (no tape on skin). following up with MD about obtaining ABI    Dressing  left leg: - ORDER for right leg only - no debridement on left- lotion, vaseoline, xeroform, alginate on xeroform, profore lite #5 netting    Dressing R leg: lotion, vaseoline, xeroform to superior blister, medihoney to gauze to wounds with slough, covered with alginate al wound were weaping, profore lite, #5 netting                     PT Short Term Goals - 01/03/21 1552      PT SHORT TERM GOAL #1   Title Wound on left leg will be completely healed to reduce risk of infection.    Time 4    Period Weeks    Status New    Target Date 01/31/21      PT SHORT TERM GOAL #2   Title Anterior lower wound on right leg will be 100% granulated to deomstrate improved wound healing.    Time 4    Period Weeks    Status New    Target Date 01/31/21      PT SHORT TERM GOAL #3   Title Posterior  wound on right leg will be 100% granulated to demonstrate improved wound healing.    Time 4    Period Weeks    Status New    Target Date 01/31/21      PT SHORT TERM GOAL #4   Title Patient will report importance of wearing daily compression garments to reduce swelling and risk of forming blisters    Time 4    Period Weeks    Status New    Target Date 02/28/21             PT Long Term Goals - 01/03/21 1553      PT LONG TERM GOAL #1   Time 8    Period Weeks    Status New    Target Date 02/28/21      PT LONG TERM GOAL #2   Title Anterior lower wound on right leg will be completely healed to reduce risk of infection.    Time 8    Period Weeks    Status New    Target Date 02/28/21  PT LONG TERM GOAL #3   Title Posterior wound on right leg will be completely healed to reduce risk of infection.    Time 8    Period  Weeks    Status New    Target Date 02/28/21      PT LONG TERM GOAL #4   Title Patietn will report <3/10 pain in legs due to wounds to reduce pain with walking                 Plan - 01/05/21 1114    Clinical Impression Statement see above    Personal Factors and Comorbidities Comorbidity 1;Comorbidity 2;Comorbidity 3+    Comorbidities pacemaker, afib, on anticoagulents, vaicose veins.    Examination-Activity Limitations Locomotion Level;Hygiene/Grooming;Stand    Examination-Participation Restrictions Community Activity    Stability/Clinical Decision Making Stable/Uncomplicated    PT Frequency 2x / week    PT Duration 8 weeks    PT Treatment/Interventions ADLs/Self Care Home Management;Manual techniques;Other (comment)   wound care, modalities   PT Next Visit Plan continue with debridement and dressing changes - follow up with cardiac MD about ABI, measure for compression garements when appropriate    PT Home Exercise Plan ankle pumps    Consulted and Agree with Plan of Care Patient           Patient will benefit from skilled therapeutic intervention in order to improve the following deficits and impairments:  Other (comment),Pain,Increased edema,Decreased skin integrity,Decreased mobility,Decreased activity tolerance (wounds)  Visit Diagnosis: Difficulty in walking, not elsewhere classified  Open leg wound, left, initial encounter  Leg wound, right, initial encounter     Problem List Patient Active Problem List   Diagnosis Date Noted  . Cellulitis and abscess of right lower extremity 12/24/2020  . Diabetes mellitus without complication ([place])   . SSS (sick sinus syndrome) ([place]) 07/27/2014  . Pacemaker syndrome 07/27/2014  . Tachy-brady syndrome ([place]) 07/27/2014  . Difficulty in walking(719.7) 11/03/2013  . Stiffness of right knee 11/03/2013  . Postoperative anemia due to acute blood loss 10/06/2013  . OA (osteoarthritis) of knee 10/05/2013  . Preoperative  cardiovascular examination 07/20/2013  . Chronic diastolic heart failure ([place]) 04/29/2013  . Long QT interval 04/21/2013  . Volume overload 04/21/2013  . Long term current use of anticoagulant therapy 01/09/2013  . HTN (hypertension) 05/20/2011  . Pleural effusion 05/20/2011  . Lactic acid acidosis 05/18/2011  . Pacemaker 05/18/2011  . Anemia 05/18/2011  . Acute renal failure (ARF) ([place]) 05/18/2011  . Pneumonia 05/17/2011  . Fever 05/17/2011  . Atrial fibrillation ([place]) 05/17/2011  . Hyperglycemia 05/17/2011  . Bradycardia 05/17/2011  . Gout 05/17/2011  . Arthritis 05/17/2011  . Thrombocytopenia ([place]) 05/17/2011   11:29 AM, 01/05/21 Jerene Pitch, DPT Physical Therapy with [facility]  [phone] office  [place] [address] [place], [place], [address] Phone: [phone]   Fax:  [phone]  Name: [name] MRN: [id number] Date of Birth: 26-Sep-1934

## 2021-01-06 ENCOUNTER — Other Ambulatory Visit (HOSPITAL_COMMUNITY)
Admission: RE | Admit: 2021-01-06 | Discharge: 2021-01-06 | Disposition: A | Discharge status: Home / Self Care | Payer: Medicare Other | Source: Ambulatory Visit | Attending: Ophthalmology | Admitting: Ophthalmology

## 2021-01-06 ENCOUNTER — Other Ambulatory Visit: Payer: Self-pay

## 2021-01-06 DIAGNOSIS — Z20822 Contact with and (suspected) exposure to covid-19: Secondary | ICD-10-CM | POA: Diagnosis not present

## 2021-01-06 DIAGNOSIS — Z01812 Encounter for preprocedural laboratory examination: Secondary | ICD-10-CM | POA: Insufficient documentation

## 2021-01-06 LAB — SARS CORONAVIRUS 2 (TAT 6-24 HRS): SARS Coronavirus 2: NEGATIVE

## 2021-01-09 ENCOUNTER — Ambulatory Visit (HOSPITAL_COMMUNITY): Payer: Medicare Other | Admitting: Anesthesiology

## 2021-01-09 ENCOUNTER — Other Ambulatory Visit: Payer: Self-pay

## 2021-01-09 ENCOUNTER — Encounter (HOSPITAL_COMMUNITY): Payer: Self-pay | Admitting: Ophthalmology

## 2021-01-09 ENCOUNTER — Encounter (HOSPITAL_COMMUNITY)
Admission: RE | Disposition: A | Discharge status: Home / Self Care | Payer: Self-pay | Source: Home / Self Care | Attending: Ophthalmology

## 2021-01-09 ENCOUNTER — Ambulatory Visit (HOSPITAL_COMMUNITY)
Admission: RE | Admit: 2021-01-09 | Discharge: 2021-01-09 | Disposition: A | Discharge status: Home / Self Care | Payer: Medicare Other | Attending: Ophthalmology | Admitting: Ophthalmology

## 2021-01-09 DIAGNOSIS — H2511 Age-related nuclear cataract, right eye: Secondary | ICD-10-CM | POA: Diagnosis present

## 2021-01-09 DIAGNOSIS — Z9842 Cataract extraction status, left eye: Secondary | ICD-10-CM | POA: Insufficient documentation

## 2021-01-09 DIAGNOSIS — Z961 Presence of intraocular lens: Secondary | ICD-10-CM | POA: Insufficient documentation

## 2021-01-09 DIAGNOSIS — E1136 Type 2 diabetes mellitus with diabetic cataract: Secondary | ICD-10-CM | POA: Insufficient documentation

## 2021-01-09 DIAGNOSIS — Z7901 Long term (current) use of anticoagulants: Secondary | ICD-10-CM | POA: Diagnosis not present

## 2021-01-09 DIAGNOSIS — H524 Presbyopia: Secondary | ICD-10-CM | POA: Diagnosis not present

## 2021-01-09 DIAGNOSIS — Z79899 Other long term (current) drug therapy: Secondary | ICD-10-CM | POA: Insufficient documentation

## 2021-01-09 HISTORY — PX: CATARACT EXTRACTION W/PHACO: SHX586

## 2021-01-09 SURGERY — PHACOEMULSIFICATION, CATARACT, WITH IOL INSERTION
Anesthesia: Monitor Anesthesia Care | Site: Eye | Laterality: Right

## 2021-01-09 MED ORDER — PROVISC 10 MG/ML IO SOLN
INTRAOCULAR | Status: DC | PRN
  Administered 2021-01-09: 0.85 mL via INTRAOCULAR

## 2021-01-09 MED ORDER — STERILE WATER FOR IRRIGATION IR SOLN
Status: DC | PRN
  Administered 2021-01-09: 250 mL

## 2021-01-09 MED ORDER — PHENYLEPHRINE HCL 2.5 % OP SOLN
1.0000 [drp] | OPHTHALMIC | Status: AC | PRN
  Administered 2021-01-09 (×3): 1 [drp] via OPHTHALMIC

## 2021-01-09 MED ORDER — BSS IO SOLN
INTRAOCULAR | Status: DC | PRN
  Administered 2021-01-09: 15 mL via INTRAOCULAR

## 2021-01-09 MED ORDER — SODIUM HYALURONATE 23 MG/ML IO SOLN
INTRAOCULAR | Status: DC | PRN
  Administered 2021-01-09: 0.6 mL via INTRAOCULAR

## 2021-01-09 MED ORDER — LIDOCAINE HCL (PF) 1 % IJ SOLN
INTRAOCULAR | Status: DC | PRN
  Administered 2021-01-09: 1 mL via OPHTHALMIC

## 2021-01-09 MED ORDER — LIDOCAINE HCL 3.5 % OP GEL
1.0000 "application " | Freq: Once | OPHTHALMIC | Status: AC
  Administered 2021-01-09: 1 via OPHTHALMIC

## 2021-01-09 MED ORDER — POVIDONE-IODINE 5 % OP SOLN
OPHTHALMIC | Status: DC | PRN
  Administered 2021-01-09: 1 via OPHTHALMIC

## 2021-01-09 MED ORDER — TETRACAINE HCL 0.5 % OP SOLN
1.0000 [drp] | OPHTHALMIC | Status: AC | PRN
  Administered 2021-01-09 (×3): 1 [drp] via OPHTHALMIC

## 2021-01-09 MED ORDER — EPINEPHRINE PF 1 MG/ML IJ SOLN
INTRAOCULAR | Status: DC | PRN
  Administered 2021-01-09: 500 mL

## 2021-01-09 MED ORDER — TROPICAMIDE 1 % OP SOLN
1.0000 [drp] | OPHTHALMIC | Status: AC
  Administered 2021-01-09 (×3): 1 [drp] via OPHTHALMIC

## 2021-01-09 MED ORDER — NEOMYCIN-POLYMYXIN-DEXAMETH 3.5-10000-0.1 OP SUSP
OPHTHALMIC | Status: DC | PRN
  Administered 2021-01-09: 1 [drp] via OPHTHALMIC

## 2021-01-09 SURGICAL SUPPLY — 12 items
CLOTH BEACON ORANGE TIMEOUT ST (SAFETY) ×1 IMPLANT
EYE SHIELD UNIVERSAL CLEAR (GAUZE/BANDAGES/DRESSINGS) ×1 IMPLANT
GLOVE SURG UNDER POLY LF SZ6.5 (GLOVE) ×1 IMPLANT
GLOVE SURG UNDER POLY LF SZ7 (GLOVE) ×1 IMPLANT
NDL HYPO 18GX1.5 BLUNT FILL (NEEDLE) IMPLANT
NEEDLE HYPO 18GX1.5 BLUNT FILL (NEEDLE) ×2 IMPLANT
PAD ARMBOARD 7.5X6 YLW CONV (MISCELLANEOUS) ×1 IMPLANT
SYR TB 1ML LL NO SAFETY (SYRINGE) ×1 IMPLANT
TAPE PAPER MEDFIX 1IN X 10YD (GAUZE/BANDAGES/DRESSINGS) ×1 IMPLANT
TECNIS IOL (Intraocular Lens) ×1 IMPLANT
VISCOELASTIC ADDITIONAL (OPHTHALMIC RELATED) IMPLANT
WATER STERILE IRR 250ML POUR (IV SOLUTION) ×1 IMPLANT

## 2021-01-09 NOTE — Anesthesia Preprocedure Evaluation (Signed)
Anesthesia Evaluation  Patient identified by MRN, date of birth, ID band Patient awake    Reviewed: Allergy & Precautions, NPO status , Patient's Chart, lab work & pertinent test results, reviewed documented beta blocker date and time   Airway Mallampati: II  TM Distance: >3 FB Neck ROM: Full    Dental  (+) Dental Advisory Given No notable dental injury :   Pulmonary pneumonia, resolved, former smoker,    Pulmonary exam normal breath sounds clear to auscultation       Cardiovascular Exercise Tolerance: Good hypertension, Pt. on home beta blockers and Pt. on medications + CAD  Normal cardiovascular exam+ dysrhythmias Atrial Fibrillation + pacemaker  Rhythm:Regular Rate:Normal     Neuro/Psych negative psych ROS   GI/Hepatic Neg liver ROS, GERD  ,  Endo/Other  diabetes, Well Controlled, Type 2  Renal/GU Renal InsufficiencyRenal disease     Musculoskeletal  (+) Arthritis ,   Abdominal   Peds  Hematology  (+) anemia ,   Anesthesia Other Findings   Reproductive/Obstetrics                             Anesthesia Physical  Anesthesia Plan  ASA: III  Anesthesia Plan: MAC   Post-op Pain Management:    Induction:   PONV Risk Score and Plan:   Airway Management Planned: Nasal Cannula and Natural Airway  Additional Equipment:   Intra-op Plan:   Post-operative Plan:   Informed Consent: I have reviewed the patients History and Physical, chart, labs and discussed the procedure including the risks, benefits and alternatives for the proposed anesthesia with the patient or authorized representative who has indicated his/her understanding and acceptance.     Dental advisory given  Plan Discussed with: CRNA and Surgeon  Anesthesia Plan Comments:         Anesthesia Quick Evaluation

## 2021-01-09 NOTE — Anesthesia Postprocedure Evaluation (Signed)
Anesthesia Post Note  Patient: [name]  Procedure(s) Performed: CATARACT EXTRACTION PHACO AND INTRAOCULAR LENS PLACEMENT (IOC) (Right Eye)  Patient location during evaluation: Phase II Anesthesia Type: MAC Level of consciousness: awake and oriented Pain management: satisfactory to patient Vital Signs Assessment: post-procedure vital signs reviewed and stable Respiratory status: spontaneous breathing and respiratory function stable Cardiovascular status: stable and blood pressure returned to baseline Postop Assessment: adequate PO intake and no apparent nausea or vomiting Anesthetic complications: no   No complications documented.   Last Vitals:  Vitals:   01/09/21 1124  BP: (!) 156/80  Pulse: 66  Resp: 14  Temp: 36.7 C  SpO2: 99%    Last Pain:  Vitals:   01/09/21 1124  TempSrc: Oral  PainSc: 4                  Karna Dupes

## 2021-01-09 NOTE — Interval H&P Note (Signed)
History and Physical Interval Note:  01/09/2021 12:48 PM  [name]  has presented today for surgery, with the diagnosis of Nuclear sclerotic cataract - Right eye.  The various methods of treatment have been discussed with the patient and family. After consideration of risks, benefits and other options for treatment, the patient has consented to  Procedure(s) with comments: CATARACT EXTRACTION PHACO AND INTRAOCULAR LENS PLACEMENT ([facility]) (Right) - right as a surgical intervention.  The patient's history has been reviewed, patient examined, no change in status, stable for surgery.  I have reviewed the patient's chart and labs.  Questions were answered to the patient's satisfaction.     [name]

## 2021-01-09 NOTE — Transfer of Care (Signed)
Immediate Anesthesia Transfer of Care Note  Patient: [name]  Procedure(s) Performed: CATARACT EXTRACTION PHACO AND INTRAOCULAR LENS PLACEMENT ([facility]) (Right Eye)  Patient Location: PACU  Anesthesia Type:MAC  Level of Consciousness: awake, alert  and oriented  Airway & Oxygen Therapy: Patient Spontanous Breathing  Post-op Assessment: Report given to RN and Post -op Vital signs reviewed and stable  Post vital signs: Reviewed and stable  Last Vitals:  Vitals Value Taken Time  BP    Temp    Pulse    Resp    SpO2      Last Pain:  Vitals:   01/09/21 1124  TempSrc: Oral  PainSc: 4       Patients Stated Pain Goal: 2 (84/78/41 2820)  Complications: No complications documented.

## 2021-01-09 NOTE — Discharge Instructions (Signed)
Please discharge patient when stable, will follow up today with Dr. Tangela Dolliver at the [facility] office immediately following discharge.  Leave shield in place until visit.  All paperwork with discharge instructions will be given at the office.  [facility] Address:  [address]  [place], [place] [address]  

## 2021-01-09 NOTE — Op Note (Signed)
Date of procedure: 01/09/21  Pre-operative diagnosis: Visually significant age-related nuclear cataract, Right Eye (H25.11)  Post-operative diagnosis: Visually significant age-related nuclear cataract, Right Eye  Procedure: Removal of cataract via phacoemulsification and insertion of intra-ocular lens Wynetta Emery and [employer] [id number]  +21.0D into the capsular bag of the Right Eye  Attending surgeon: Gerda Diss. Charidy Cappelletti, MD, MA  Anesthesia: MAC, Topical Akten  Complications: None  Estimated Blood Loss: <36m (minimal)  Specimens: None  Implants: As above  Indications:  Visually significant age-related cataract, Right Eye  Procedure:  The patient was seen and identified in the pre-operative area. The operative eye was identified and dilated.  The operative eye was marked.  Topical anesthesia was administered to the operative eye.     The patient was then to the operative suite and placed in the supine position.  A timeout was performed confirming the patient, procedure to be performed, and all other relevant information.   The patient's face was prepped and draped in the usual fashion for intra-ocular surgery.  A lid speculum was placed into the operative eye and the surgical microscope moved into place and focused.  A superotemporal paracentesis was created using a 20 gauge paracentesis blade.  Shugarcaine was injected into the anterior chamber.  Viscoelastic was injected into the anterior chamber.  A temporal clear-corneal main wound incision was created using a 2.462mmicrokeratome.  A continuous curvilinear capsulorrhexis was initiated using an irrigating cystitome and completed using capsulorrhexis forceps.  Hydrodissection and hydrodeliniation were performed.  Viscoelastic was injected into the anterior chamber.  A phacoemulsification handpiece and a chopper as a second instrument were used to remove the nucleus and epinucleus. The irrigation/aspiration handpiece was used to remove any  remaining cortical material.   The capsular bag was reinflated with viscoelastic, checked, and found to be intact.  The intraocular lens was inserted into the capsular bag.  The irrigation/aspiration handpiece was used to remove any remaining viscoelastic.  The clear corneal wound and paracentesis wounds were then hydrated and checked with Weck-Cels to be watertight.  The lid-speculum and drape was removed, and the patient's face was cleaned with a wet and dry 4x4.  Maxitrol was instilled in the eye before a clear shield was taped over the eye. The patient was taken to the post-operative care unit in good condition, having tolerated the procedure well.  Post-Op Instructions: [facility] and will receive all other orders and instructions.

## 2021-01-10 ENCOUNTER — Encounter (HOSPITAL_COMMUNITY): Payer: Self-pay | Admitting: Ophthalmology

## 2021-01-10 ENCOUNTER — Ambulatory Visit (HOSPITAL_COMMUNITY): Payer: Medicare Other | Admitting: Physical Therapy

## 2021-01-10 DIAGNOSIS — R262 Difficulty in walking, not elsewhere classified: Secondary | ICD-10-CM

## 2021-01-10 DIAGNOSIS — S81801A Unspecified open wound, right lower leg, initial encounter: Secondary | ICD-10-CM

## 2021-01-10 NOTE — Therapy (Signed)
[place], [place], [address] Phone: [phone]   Fax:  [phone]  Wound Care Therapy  Patient Details  Name: [name] MRN: [id number] Date of Birth: 12-30-1933 Referring Provider (PT): Jeanella Craze, DO   Encounter Date: 01/10/2021   PT End of Session - 01/10/21 1643    Visit Number 3    Number of Visits 16    Date for PT Re-Evaluation 02/28/21    Authorization Type UHC medicare, no auth or VL    Progress Note Due on Visit 10    PT Start Time 1530    PT Stop Time 1614    PT Time Calculation (min) 44 min    Activity Tolerance Patient limited by pain    Behavior During Therapy [facility] for tasks assessed/performed           Past Medical History:  Diagnosis Date  . Arthritis   . Atrial fibrillation ([place])   . Barrett's esophagus   . CAD (coronary artery disease)    echo 09-05-2010-EF nl, mod-severe dilated Left atrium; myoview 02-08-12-EF 52%, no ischemia  . Cancer [facility])    prostate - radiation only  . Diabetes mellitus without complication ([place])    "prediabetic"-no oral meds  . GERD (gastroesophageal reflux disease)   . Gout   . H/O transesophageal echocardiography (TEE) for monitoring 09/05/10   tee guided AT pace termination, did not proceed with cardioversion due to termination of the atrial flutter through his pacemaker  . History of cardioversion 02/09/13   electrical synchronized cardioversion, on flecainide and warfarin  . History of kidney stones    per pt - no actual diagnosisi  . History of stomach ulcers    many yrs ago  . HTN (hypertension)   . Pacemaker    medtronic  . Pacemaker 09-25-13  . Prostate cancer ([place]) 09-25-13   tx. with radiation only- dx. 2013    Past Surgical History:  Procedure Laterality Date  . APPENDECTOMY    . CARDIAC CATHETERIZATION  02/18/06   EF 65%, focal napkin ring-like lesion into the prox LAD otherwise normal coronaries  . CARDIOVERSION N/A 02/09/2013    Procedure: CARDIOVERSION;  Surgeon: Sanda Klein, MD;  Location: [place] [facility];  Service: Cardiovascular;  Laterality: N/A;  . CATARACT EXTRACTION W/PHACO Left 12/23/2020   Procedure: CATARACT EXTRACTION PHACO AND INTRAOCULAR LENS PLACEMENT ([place]);  Surgeon: [name], MD;  [facility];  Service: Ophthalmology;  Laterality: Left;  CDE 18.55  . CATARACT EXTRACTION W/PHACO Right 01/09/2021   Procedure: CATARACT EXTRACTION PHACO AND INTRAOCULAR LENS PLACEMENT ([facility]);  Surgeon: [name], MD;  Location: [facility];  Service: Ophthalmology;  Laterality: Right;  CDE: 28.65  . PACEMAKER GENERATOR CHANGE N/A 07/27/2014   Procedure: PACEMAKER GENERATOR CHANGE;  Surgeon: Sanda Klein, MD;  Location: [place] CATH LAB;  Service: Cardiovascular;  Laterality: N/A;  . PACEMAKER INSERTION     medtronic adapta- Dr. Sallyanne Kuster follows- LOV 9'14  . TONSILLECTOMY    . TOTAL KNEE ARTHROPLASTY Right 10/05/2013   Procedure: RIGHT TOTAL KNEE ARTHROPLASTY;  Surgeon: Gearlean Alf, MD;  [facility];  Service: Orthopedics;  Laterality: Right;    There were no vitals filed for this visit.               Wound Therapy - 01/10/21 1629    Subjective Pt states that his legs hurt when we work on them and that night but then the pain calms down  Patient and Family Stated Goals to have wounds to heal    Prior Treatments neosporin, gauze, antibitics    Pain Scale 0-10    Pain Score 4     Multiple Pain Sites No    Wound Properties Date First Assessed: 01/03/21 Time First Assessed: 1415 Wound Type: Venous stasis ulcer Location: Pretibial Location Orientation: Left;Proximal Wound Description (Comments): left anterior wound Present on Admission: Yes   Treatment Cleansed   moisturized   Wound Properties Date First Assessed: 01/03/21 Time First Assessed: 1430 Wound Type: Venous stasis ulcer Location: Pretibial Location Orientation: Proximal;Right Wound Description (Comments): right upper shin blister Present on  Admission: Yes   Dressing Type Compression wrap;Bismuth petroleum    Dressing Changed Changed    Dressing Status Old drainage    Dressing Change Frequency PRN    Site / Wound Assessment Yellow;Red    % Wound base Red or Granulating 90%    % Wound base Yellow/Fibrinous Exudate 10%    Drainage Amount Minimal    Drainage Description Purulent;Green;Serous    Treatment Cleansed;Debridement (Selective)    Wound Properties Date First Assessed: 01/03/21 Time First Assessed: 1430 Wound Type: Diabetic ulcer Location: Pretibial Location Orientation: Distal;Right Wound Description (Comments): right lower wound Present on Admission: Yes   Dressing Type Bismuth petroleum;Compression wrap    Dressing Changed Changed    Dressing Status Old drainage;Other (Comment)    Site / Wound Assessment Red;Yellow    % Wound base Yellow/Fibrinous Exudate 60%    % Wound base Black/Eschar 40%    Drainage Amount Minimal    Drainage Description Sanguineous    Treatment Cleansed;Debridement (Selective)    Wound Properties Date First Assessed: 01/03/21 Wound Type: Diabetic ulcer Location: Tibial Location Orientation: Distal;Posterior;Right Wound Description (Comments): right posterior wound Present on Admission: Yes   Dressing Type Compression wrap    Dressing Changed Changed    Dressing Status Old drainage    Dressing Change Frequency PRN    Site / Wound Assessment Red;Yellow    % Wound base Red or Granulating 40%    % Wound base Yellow/Fibrinous Exudate 60%    Drainage Amount Moderate    Treatment Cleansed;Debridement (Selective)    Selective Debridement - Location Rt wounds    Selective Debridement - Tools Used Forceps    Selective Debridement - Tissue Removed devitalized tissue, necrotic tissue.    Wound Therapy - Clinical Statement All wounds on LT have healed leg measured for compression garment.  Therapist gave pt sheet for elastiv therapy and recommended that pt call and purchase a butler as well.  Therapist  continued to bandage this leg with profore to prevent new wounds from breaking out.  Pt very sensitive on Rt LE with minimal debridement allowed.  Noted green drainage therefore therapist placed medihoney on wounds prior to xeroform .  Pt has heard about ABI but still has not scheduled.    Wound Therapy - Functional Problem List difficulty walking, bathing    Factors Delaying/Impairing Wound Healing Vascular compromise;Infection - systemic/local;Diabetes Mellitus   recent infection, heart disease   Wound Therapy - Frequency 2X / week    Wound Plan debridement and dressing change to wounds.  Educate on donning compression garment when it comes in.  Assess if there is green drainage.    Dressing  medihoney, xeroform proforelite for RT; profore lite on Lt                     PT Short Term Goals -  01/10/21 1646      PT SHORT TERM GOAL #1   Title Wound on left leg will be completely healed to reduce risk of infection.    Time 4    Period Weeks    Status On-going    Target Date 01/31/21      PT SHORT TERM GOAL #2   Title Anterior lower wound on right leg will be 100% granulated to deomstrate improved wound healing.    Time 4    Period Weeks    Status On-going    Target Date 01/31/21      PT SHORT TERM GOAL #3   Title Posterior  wound on right leg will be 100% granulated to demonstrate improved wound healing.    Time 4    Period Weeks    Status On-going    Target Date 01/31/21      PT SHORT TERM GOAL #4   Title Patient will report importance of wearing daily compression garments to reduce swelling and risk of forming blisters    Time 4    Period Weeks    Status On-going    Target Date 02/28/21             PT Long Term Goals - 01/10/21 1646      PT LONG TERM GOAL #1   Time 8    Period Weeks    Status New      PT LONG TERM GOAL #2   Title Anterior lower wound on right leg will be completely healed to reduce risk of infection.    Time 8    Period Weeks     Status On-going      PT LONG TERM GOAL #3   Title Posterior wound on right leg will be completely healed to reduce risk of infection.    Time 8    Period Weeks    Status On-going      PT LONG TERM GOAL #4   Title Patietn will report <3/10 pain in legs due to wounds to reduce pain with walking    Status On-going                 Plan - 01/10/21 1645    Clinical Impression Statement see above    Personal Factors and Comorbidities Comorbidity 1;Comorbidity 2;Comorbidity 3+    Comorbidities pacemaker, afib, on anticoagulents, vaicose veins.    Examination-Activity Limitations Locomotion Level;Hygiene/Grooming;Stand    Examination-Participation Restrictions Community Activity    Stability/Clinical Decision Making Stable/Uncomplicated    PT Frequency 2x / week    PT Duration 8 weeks    PT Treatment/Interventions ADLs/Self Care Home Management;Manual techniques;Other (comment)   wound care, modalities   PT Next Visit Plan continue with debridement and dressing changes - measure wound s    PT Home Exercise Plan ankle pumps    Consulted and Agree with Plan of Care Patient           Patient will benefit from skilled therapeutic intervention in order to improve the following deficits and impairments:  Other (comment),Pain,Increased edema,Decreased skin integrity,Decreased mobility,Decreased activity tolerance (wounds)  Visit Diagnosis: Difficulty in walking, not elsewhere classified  Leg wound, right, initial encounter     Problem List Patient Active Problem List   Diagnosis Date Noted  . Cellulitis and abscess of right lower extremity 12/24/2020  . Diabetes mellitus without complication ([place])   . SSS (sick sinus syndrome) ([place]) 07/27/2014  . Pacemaker syndrome 07/27/2014  . Tachy-brady syndrome ([place]) 07/27/2014  .  Difficulty in walking(719.7) 11/03/2013  . Stiffness of right knee 11/03/2013  . Postoperative anemia due to acute blood loss 10/06/2013  . OA  (osteoarthritis) of knee 10/05/2013  . Preoperative cardiovascular examination 07/20/2013  . Chronic diastolic heart failure ([place]) 04/29/2013  . Long QT interval 04/21/2013  . Volume overload 04/21/2013  . Long term current use of anticoagulant therapy 01/09/2013  . HTN (hypertension) 05/20/2011  . Pleural effusion 05/20/2011  . Lactic acid acidosis 05/18/2011  . Pacemaker 05/18/2011  . Anemia 05/18/2011  . Acute renal failure (ARF) ([place]) 05/18/2011  . Pneumonia 05/17/2011  . Fever 05/17/2011  . Atrial fibrillation ([place]) 05/17/2011  . Hyperglycemia 05/17/2011  . Bradycardia 05/17/2011  . Gout 05/17/2011  . Arthritis 05/17/2011  . Thrombocytopenia ([place]) 05/17/2011    Rayetta Humphrey, PT CLT [phone] 01/10/2021, 4:48 PM  [place] [address] [place], [place], [address] Phone: [phone]   Fax:  [phone]  Name: DIAMANTE RUBIN MRN: [id number] Date of Birth: [birth year]

## 2021-01-12 ENCOUNTER — Other Ambulatory Visit: Payer: Self-pay

## 2021-01-12 ENCOUNTER — Ambulatory Visit (HOSPITAL_COMMUNITY): Payer: Medicare Other | Admitting: Physical Therapy

## 2021-01-12 ENCOUNTER — Encounter (HOSPITAL_COMMUNITY): Payer: Self-pay | Admitting: Physical Therapy

## 2021-01-12 DIAGNOSIS — R262 Difficulty in walking, not elsewhere classified: Secondary | ICD-10-CM | POA: Diagnosis not present

## 2021-01-12 DIAGNOSIS — S81801A Unspecified open wound, right lower leg, initial encounter: Secondary | ICD-10-CM

## 2021-01-12 DIAGNOSIS — S81802A Unspecified open wound, left lower leg, initial encounter: Secondary | ICD-10-CM

## 2021-01-12 NOTE — Therapy (Signed)
[place], [place], [address] Phone: [phone]   Fax:  [phone]  Wound Care Therapy  Patient Details  Name: [name] MRN: [id number] Date of Birth: [birth year] Referring Provider (PT): Jeanella Craze, DO   Encounter Date: 01/12/2021   PT End of Session - 01/12/21 1157    Visit Number 4    Number of Visits 16    Date for PT Re-Evaluation 02/28/21    Authorization Type UHC medicare, no auth or VL    Progress Note Due on Visit 10    PT Start Time 1045    PT Stop Time 1125    PT Time Calculation (min) 40 min    Activity Tolerance Patient limited by pain    Behavior During Therapy [facility] for tasks assessed/performed           Past Medical History:  Diagnosis Date  . Arthritis   . Atrial fibrillation ([place])   . Barrett's esophagus   . CAD (coronary artery disease)    echo 09-05-2010-EF nl, mod-severe dilated Left atrium; myoview 02-08-12-EF 52%, no ischemia  . Cancer [facility])    prostate - radiation only  . Diabetes mellitus without complication ([place])    "prediabetic"-no oral meds  . GERD (gastroesophageal reflux disease)   . Gout   . H/O transesophageal echocardiography (TEE) for monitoring 09/05/10   tee guided AT pace termination, did not proceed with cardioversion due to termination of the atrial flutter through his pacemaker  . History of cardioversion 02/09/13   electrical synchronized cardioversion, on flecainide and warfarin  . History of kidney stones    per pt - no actual diagnosisi  . History of stomach ulcers    many yrs ago  . HTN (hypertension)   . Pacemaker    medtronic  . Pacemaker 09-25-13  . Prostate cancer ([place]) 09-25-13   tx. with radiation only- dx. 2013    Past Surgical History:  Procedure Laterality Date  . APPENDECTOMY    . CARDIAC CATHETERIZATION  02/18/06   EF 65%, focal napkin ring-like lesion into the prox LAD otherwise normal coronaries  . CARDIOVERSION N/A 02/09/2013    Procedure: CARDIOVERSION;  Surgeon: Sanda Klein, MD;  Location: [place] ENDOSCOPY;  Service: Cardiovascular;  Laterality: N/A;  . CATARACT EXTRACTION W/PHACO Left 12/23/2020   Procedure: CATARACT EXTRACTION PHACO AND INTRAOCULAR LENS PLACEMENT ([place]);  Surgeon: [name], MD;  [facility];  Service: Ophthalmology;  Laterality: Left;  CDE 18.55  . CATARACT EXTRACTION W/PHACO Right 01/09/2021   Procedure: CATARACT EXTRACTION PHACO AND INTRAOCULAR LENS PLACEMENT ([facility]);  Surgeon: [name], MD;  [facility];  Service: Ophthalmology;  Laterality: Right;  CDE: 28.65  . PACEMAKER GENERATOR CHANGE N/A 07/27/2014   Procedure: PACEMAKER GENERATOR CHANGE;  Surgeon: Sanda Klein, MD;  Location: [place] CATH LAB;  Service: Cardiovascular;  Laterality: N/A;  . PACEMAKER INSERTION     medtronic adapta- Dr. Sallyanne Kuster follows- LOV 9'14  . TONSILLECTOMY    . TOTAL KNEE ARTHROPLASTY Right 10/05/2013   Procedure: RIGHT TOTAL KNEE ARTHROPLASTY;  Surgeon: Gearlean Alf, MD;  [facility];  Service: Orthopedics;  Laterality: Right;    There were no vitals filed for this visit.      [facility] PT Assessment - 01/12/21 0001      Assessment   Medical Diagnosis wounds on right leg    Referring Provider (PT) Pratick Manuella Ghazi, DO  Wound Therapy - 01/12/21 0001    Subjective Patient reports he went to his MD yesterday and they took the dressings off and then reapplied them. States that the pain comes on after it has been messed with and the pain lingers all day.    Patient and Family Stated Goals to have wounds to heal    Prior Treatments neosporin, gauze, antibitics    Pain Scale 0-10    Pain Score 10-Worst pain ever    Evaluation and Treatment Procedures Explained to Patient/Family Yes    Evaluation and Treatment Procedures agreed to    Wound Properties Date First Assessed: 01/03/21 Time First Assessed: 1430 Wound Type: Venous stasis ulcer Location: Pretibial Location  Orientation: Proximal;Right Wound Description (Comments): right upper shin blister Present on Admission: Yes   Dressing Type Compression wrap   vaseoline gauze   Dressing Changed Changed    Dressing Status Old drainage    Dressing Change Frequency PRN    Site / Wound Assessment Yellow;Red;Pink    % Wound base Red or Granulating 100%    % Wound base Yellow/Fibrinous Exudate 0%    Wound Length (cm) 0.4 cm    Wound Width (cm) 0.4 cm    Wound Depth (cm) 0 cm    Wound Volume (cm^3) 0 cm^3    Wound Surface Area (cm^2) 0.16 cm^2    Margins Attached edges (approximated)    Drainage Amount Minimal    Drainage Description Serosanguineous;No odor    Treatment Cleansed;Debridement (Selective)    Wound Properties Date First Assessed: 01/03/21 Time First Assessed: 1430 Wound Type: Diabetic ulcer Location: Pretibial Location Orientation: Distal;Right Wound Description (Comments): right lower wound Present on Admission: Yes   Dressing Type Compression wrap   vaeoline gauze   Dressing Changed Changed    Dressing Status Old drainage    Site / Wound Assessment Red;Yellow    % Wound base Yellow/Fibrinous Exudate 35%    % Wound base Black/Eschar 65%    Peri-wound Assessment Edema;Erythema (blanchable);Maceration    Wound Length (cm) 5.2 cm    Wound Width (cm) 4 cm    Wound Depth (cm) 0.3 cm    Wound Volume (cm^3) 6.24 cm^3    Wound Surface Area (cm^2) 20.8 cm^2    Margins Unattached edges (unapproximated)    Drainage Amount Moderate    Drainage Description Serosanguineous    Treatment Cleansed;Debridement (Selective)    Wound Properties Date First Assessed: 01/03/21 Wound Type: Diabetic ulcer Location: Tibial Location Orientation: Distal;Posterior;Right Wound Description (Comments): right posterior wound Present on Admission: Yes   Dressing Type Compression wrap   gauze vaseoline   Dressing Changed Changed    Dressing Status Old drainage    Dressing Change Frequency PRN    Site / Wound Assessment  Red;Yellow    % Wound base Red or Granulating 30%    % Wound base Yellow/Fibrinous Exudate 70%    Peri-wound Assessment Maceration;Edema;Erythema (blanchable)    Wound Length (cm) 3.9 cm    Wound Width (cm) 4.2 cm    Wound Depth (cm) 0.3 cm    Wound Volume (cm^3) 4.91 cm^3    Wound Surface Area (cm^2) 16.38 cm^2    Margins Unattached edges (unapproximated)    Drainage Amount Minimal    Drainage Description Serosanguineous    Treatment Cleansed;Debridement (Selective)    Selective Debridement - Location Rt wounds    Selective Debridement - Tools Used Forceps;Scalpel    Selective Debridement - Tissue Removed devitalized tissue, necrotic tissue.  Wound Therapy - Clinical Statement Pain limiting debridement during session with 10/10 pain reported. Continued slough in wound keeping wound from approximating. Patient may benefit from santyl as debridement is too painful for patient to tolerate, will contact MD about possible benefits of santyl. Maceration noted on right leg near wound beds, likely due to dressing change yesterday. Applied vaseoline all over leg and continued with medihoney but added alginate secondary to maceration noted. Increased skin irritation surrounding wound bed. No green drainage noted on this date. Encouraged patient to get compression garments as he had not ordered them yet.    Wound Therapy - Functional Problem List difficulty walking, bathing    Factors Delaying/Impairing Wound Healing Vascular compromise;Infection - systemic/local;Diabetes Mellitus   recent infection, heart disease   Wound Therapy - Frequency 2X / week    Wound Plan debridement and dressing change to wounds.  Educate on donning compression garment when it comes in.    Dressing  medihoney, then alginate, vaseoline,proforelite for RT; profore lite on Lt                     PT Short Term Goals - 01/10/21 1646      PT SHORT TERM GOAL #1   Title Wound on left leg will be completely healed to  reduce risk of infection.    Time 4    Period Weeks    Status On-going    Target Date 01/31/21      PT SHORT TERM GOAL #2   Title Anterior lower wound on right leg will be 100% granulated to deomstrate improved wound healing.    Time 4    Period Weeks    Status On-going    Target Date 01/31/21      PT SHORT TERM GOAL #3   Title Posterior  wound on right leg will be 100% granulated to demonstrate improved wound healing.    Time 4    Period Weeks    Status On-going    Target Date 01/31/21      PT SHORT TERM GOAL #4   Title Patient will report importance of wearing daily compression garments to reduce swelling and risk of forming blisters    Time 4    Period Weeks    Status On-going    Target Date 02/28/21             PT Long Term Goals - 01/10/21 1646      PT LONG TERM GOAL #1   Time 8    Period Weeks    Status New      PT LONG TERM GOAL #2   Title Anterior lower wound on right leg will be completely healed to reduce risk of infection.    Time 8    Period Weeks    Status On-going      PT LONG TERM GOAL #3   Title Posterior wound on right leg will be completely healed to reduce risk of infection.    Time 8    Period Weeks    Status On-going      PT LONG TERM GOAL #4   Title Patietn will report <3/10 pain in legs due to wounds to reduce pain with walking    Status On-going                 Plan - 01/12/21 1157    Clinical Impression Statement see above    Personal Factors and Comorbidities Comorbidity 1;Comorbidity 2;Comorbidity 3+  Comorbidities pacemaker, afib, on anticoagulents, vaicose veins.    Examination-Activity Limitations Locomotion Level;Hygiene/Grooming;Stand    Examination-Participation Restrictions Community Activity    Stability/Clinical Decision Making Stable/Uncomplicated    PT Frequency 2x / week    PT Duration 8 weeks    PT Treatment/Interventions ADLs/Self Care Home Management;Manual techniques;Other (comment)   wound care,  modalities   PT Next Visit Plan continue with debridement and dressing changes - measure wounds    PT Home Exercise Plan ankle pumps    Consulted and Agree with Plan of Care Patient           Patient will benefit from skilled therapeutic intervention in order to improve the following deficits and impairments:  Other (comment),Pain,Increased edema,Decreased skin integrity,Decreased mobility,Decreased activity tolerance (wounds)  Visit Diagnosis: Difficulty in walking, not elsewhere classified  Leg wound, right, initial encounter  Open leg wound, left, initial encounter     Problem List Patient Active Problem List   Diagnosis Date Noted  . Cellulitis and abscess of right lower extremity 12/24/2020  . Diabetes mellitus without complication ([place])   . SSS (sick sinus syndrome) ([place]) 07/27/2014  . Pacemaker syndrome 07/27/2014  . Tachy-brady syndrome ([place]) 07/27/2014  . Difficulty in walking(719.7) 11/03/2013  . Stiffness of right knee 11/03/2013  . Postoperative anemia due to acute blood loss 10/06/2013  . OA (osteoarthritis) of knee 10/05/2013  . Preoperative cardiovascular examination 07/20/2013  . Chronic diastolic heart failure ([place]) 04/29/2013  . Long QT interval 04/21/2013  . Volume overload 04/21/2013  . Long term current use of anticoagulant therapy 01/09/2013  . HTN (hypertension) 05/20/2011  . Pleural effusion 05/20/2011  . Lactic acid acidosis 05/18/2011  . Pacemaker 05/18/2011  . Anemia 05/18/2011  . Acute renal failure (ARF) ([place]) 05/18/2011  . Pneumonia 05/17/2011  . Fever 05/17/2011  . Atrial fibrillation ([place]) 05/17/2011  . Hyperglycemia 05/17/2011  . Bradycardia 05/17/2011  . Gout 05/17/2011  . Arthritis 05/17/2011  . Thrombocytopenia ([place]) 05/17/2011   12:36 PM, 01/12/21 Jerene Pitch, DPT Physical Therapy with [facility]  [phone] office  [place], [place], [address] Phone: [phone]   Fax:  [phone]  Name: [name] MRN: [id number] Date of Birth: 06-10-1934

## 2021-01-17 ENCOUNTER — Other Ambulatory Visit: Payer: Self-pay

## 2021-01-17 ENCOUNTER — Ambulatory Visit (HOSPITAL_COMMUNITY): Payer: Medicare Other | Admitting: Physical Therapy

## 2021-01-17 DIAGNOSIS — S81802A Unspecified open wound, left lower leg, initial encounter: Secondary | ICD-10-CM

## 2021-01-17 DIAGNOSIS — S81801A Unspecified open wound, right lower leg, initial encounter: Secondary | ICD-10-CM

## 2021-01-17 DIAGNOSIS — R262 Difficulty in walking, not elsewhere classified: Secondary | ICD-10-CM | POA: Diagnosis not present

## 2021-01-17 NOTE — Addendum Note (Signed)
Addended by: Jerene Pitch R on: 01/17/2021 04:01 PM   Modules accepted: Orders

## 2021-01-17 NOTE — Therapy (Signed)
[place], [place], [address] Phone: [phone]   Fax:  [phone]  Wound Care Therapy  Patient Details  Name: [name] MRN: [id number] Date of Birth: [birth year] Referring Provider (PT): Jeanella Craze, DO   Encounter Date: 01/17/2021   PT End of Session - 01/17/21 1155    Visit Number 5    Number of Visits 16    Date for PT Re-Evaluation 02/28/21    Authorization Type UHC medicare, no auth or VL    Progress Note Due on Visit 10    PT Start Time 1052    PT Stop Time 1120    PT Time Calculation (min) 28 min    Activity Tolerance Patient limited by pain    Behavior During Therapy [facility] for tasks assessed/performed           Past Medical History:  Diagnosis Date  . Arthritis   . Atrial fibrillation ([place])   . Barrett's esophagus   . CAD (coronary artery disease)    echo 09-05-2010-EF nl, mod-severe dilated Left atrium; myoview 02-08-12-EF 52%, no ischemia  . Cancer [facility])    prostate - radiation only  . Diabetes mellitus without complication ([place])    "prediabetic"-no oral meds  . GERD (gastroesophageal reflux disease)   . Gout   . H/O transesophageal echocardiography (TEE) for monitoring 09/05/10   tee guided AT pace termination, did not proceed with cardioversion due to termination of the atrial flutter through his pacemaker  . History of cardioversion 02/09/13   electrical synchronized cardioversion, on flecainide and warfarin  . History of kidney stones    per pt - no actual diagnosisi  . History of stomach ulcers    many yrs ago  . HTN (hypertension)   . Pacemaker    medtronic  . Pacemaker 09-25-13  . Prostate cancer ([place]) 09-25-13   tx. with radiation only- dx. 2013    Past Surgical History:  Procedure Laterality Date  . APPENDECTOMY    . CARDIAC CATHETERIZATION  02/18/06   EF 65%, focal napkin ring-like lesion into the prox LAD otherwise normal coronaries  . CARDIOVERSION N/A 02/09/2013    Procedure: CARDIOVERSION;  Surgeon: Sanda Klein, MD;  Location: [place] [facility];  Service: Cardiovascular;  Laterality: N/A;  . CATARACT EXTRACTION W/PHACO Left 12/23/2020   Procedure: CATARACT EXTRACTION PHACO AND INTRAOCULAR LENS PLACEMENT ([place]);  Surgeon: [name], MD;  [facility];  Service: Ophthalmology;  Laterality: Left;  CDE 18.55  . CATARACT EXTRACTION W/PHACO Right 01/09/2021   Procedure: CATARACT EXTRACTION PHACO AND INTRAOCULAR LENS PLACEMENT ([facility]);  Surgeon: [name], MD;  Location: [facility];  Service: Ophthalmology;  Laterality: Right;  CDE: 28.65  . PACEMAKER GENERATOR CHANGE N/A 07/27/2014   Procedure: PACEMAKER GENERATOR CHANGE;  Surgeon: Sanda Klein, MD;  Location: [place] CATH LAB;  Service: Cardiovascular;  Laterality: N/A;  . PACEMAKER INSERTION     medtronic adapta- Dr. Sallyanne Kuster follows- LOV 9'14  . TONSILLECTOMY    . TOTAL KNEE ARTHROPLASTY Right 10/05/2013   Procedure: RIGHT TOTAL KNEE ARTHROPLASTY;  Surgeon: Gearlean Alf, MD;  Location[facility];  Service: Orthopedics;  Laterality: Right;    There were no vitals filed for this visit.               Wound Therapy - 01/17/21 1143    Subjective pt comes toay with prescription for santyl.  States his compression stockings should be here by his next appointment and he has  a podiatrist appt scheduled end of this month.    Patient and Family Stated Goals to have wounds to heal    Prior Treatments neosporin, gauze, antibitics    Pain Scale 0-10    Pain Score 0-No pain   unless touching, debriding then goes up to 10/10   Evaluation and Treatment Procedures Explained to Patient/Family Yes    Evaluation and Treatment Procedures agreed to    Wound Properties Date First Assessed: 01/03/21 Time First Assessed: 1430 Wound Type: Venous stasis ulcer Location: Pretibial Location Orientation: Proximal;Right Wound Description (Comments): right upper shin blister Present on Admission: Yes   Dressing Type Compression  wrap    Dressing Changed Changed    Dressing Status Old drainage    Dressing Change Frequency PRN    Site / Wound Assessment Yellow;Red;Pink    % Wound base Red or Granulating 100%    Drainage Amount Minimal    Drainage Description Serosanguineous    Treatment Cleansed;Debridement (Selective)    Wound Properties Date First Assessed: 01/03/21 Time First Assessed: 1430 Wound Type: Diabetic ulcer Location: Pretibial Location Orientation: Distal;Right Wound Description (Comments): right lower wound Present on Admission: Yes   Dressing Type Compression wrap    Dressing Changed Changed    Dressing Status Old drainage    Site / Wound Assessment Red;Yellow    % Wound base Yellow/Fibrinous Exudate 40%    % Wound base Black/Eschar 60%    Peri-wound Assessment Edema;Erythema (blanchable)    Margins Unattached edges (unapproximated)    Drainage Amount Moderate    Drainage Description Serosanguineous;Green;Purulent    Treatment Cleansed;Debridement (Selective)    Wound Properties Date First Assessed: 01/03/21 Wound Type: Diabetic ulcer Location: Tibial Location Orientation: Distal;Posterior;Right Wound Description (Comments): right posterior wound Present on Admission: Yes   Dressing Type Compression wrap    Dressing Changed Changed    Dressing Status Old drainage    Dressing Change Frequency PRN    Site / Wound Assessment Red;Yellow    % Wound base Red or Granulating 60%    % Wound base Yellow/Fibrinous Exudate 40%    Peri-wound Assessment Maceration;Erythema (blanchable)    Drainage Amount Moderate    Drainage Description Serosanguineous;Purulent;Green    Treatment Cleansed;Debridement (Selective)    Selective Debridement - Location Rt wounds    Selective Debridement - Tools Used Forceps;Scalpel    Selective Debridement - Tissue Removed devitalized tissue, necrotic tissue.    Wound Therapy - Clinical Statement Noted purlulent green drainage mixed in with serosanginous drainage today.   Wounds, however shown increased granluation today as compared to last session following dressing removal and clean up.  Perimeter red but no signs of infection.  Changed to santyl for larger 2 wounds.  Discussed need to get toenails cut prior to using compression stockings, however pt does not have appt until end of this month.  Also informed patient (and front office) that he will have to increase his visits to 3X week since he is now using santyl.    Wound Therapy - Functional Problem List difficulty walking, bathing    Factors Delaying/Impairing Wound Healing Vascular compromise;Infection - systemic/local;Diabetes Mellitus   recent infection, heart disease   Wound Therapy - Frequency 2X / week    Wound Plan debridement and dressing change to wounds.  Educate on donning compression garment when it comes in.    Dressing  santyl on larger 2 wounds, xeroform on small area, 4X4 and profore lite  PT Short Term Goals - 01/10/21 1646      PT SHORT TERM GOAL #1   Title Wound on left leg will be completely healed to reduce risk of infection.    Time 4    Period Weeks    Status On-going    Target Date 01/31/21      PT SHORT TERM GOAL #2   Title Anterior lower wound on right leg will be 100% granulated to deomstrate improved wound healing.    Time 4    Period Weeks    Status On-going    Target Date 01/31/21      PT SHORT TERM GOAL #3   Title Posterior  wound on right leg will be 100% granulated to demonstrate improved wound healing.    Time 4    Period Weeks    Status On-going    Target Date 01/31/21      PT SHORT TERM GOAL #4   Title Patient will report importance of wearing daily compression garments to reduce swelling and risk of forming blisters    Time 4    Period Weeks    Status On-going    Target Date 02/28/21             PT Long Term Goals - 01/10/21 1646      PT LONG TERM GOAL #1   Time 8    Period Weeks    Status New      PT LONG TERM  GOAL #2   Title Anterior lower wound on right leg will be completely healed to reduce risk of infection.    Time 8    Period Weeks    Status On-going      PT LONG TERM GOAL #3   Title Posterior wound on right leg will be completely healed to reduce risk of infection.    Time 8    Period Weeks    Status On-going      PT LONG TERM GOAL #4   Title Patietn will report <3/10 pain in legs due to wounds to reduce pain with walking    Status On-going                  Patient will benefit from skilled therapeutic intervention in order to improve the following deficits and impairments:     Visit Diagnosis: Difficulty in walking, not elsewhere classified  Leg wound, right, initial encounter  Open leg wound, left, initial encounter     Problem List Patient Active Problem List   Diagnosis Date Noted  . Cellulitis and abscess of right lower extremity 12/24/2020  . Diabetes mellitus without complication ([place])   . SSS (sick sinus syndrome) ([place]) 07/27/2014  . Pacemaker syndrome 07/27/2014  . Tachy-brady syndrome ([place]) 07/27/2014  . Difficulty in walking(719.7) 11/03/2013  . Stiffness of right knee 11/03/2013  . Postoperative anemia due to acute blood loss 10/06/2013  . OA (osteoarthritis) of knee 10/05/2013  . Preoperative cardiovascular examination 07/20/2013  . Chronic diastolic heart failure ([place]) 04/29/2013  . Long QT interval 04/21/2013  . Volume overload 04/21/2013  . Long term current use of anticoagulant therapy 01/09/2013  . HTN (hypertension) 05/20/2011  . Pleural effusion 05/20/2011  . Lactic acid acidosis 05/18/2011  . Pacemaker 05/18/2011  . Anemia 05/18/2011  . Acute renal failure (ARF) ([place]) 05/18/2011  . Pneumonia 05/17/2011  . Fever 05/17/2011  . Atrial fibrillation ([place]) 05/17/2011  . Hyperglycemia 05/17/2011  . Bradycardia 05/17/2011  . Gout 05/17/2011  .  Arthritis 05/17/2011  . Thrombocytopenia ([place]) 05/17/2011   Teena Irani,  PTA/CLT [phone]  Teena Irani 01/17/2021, 11:55 AM  [facility] [address] [place], [place], [address] Phone: [phone]   Fax:  [phone]  Name: [name] MRN: [id number] Date of Birth: 1934/01/15

## 2021-01-18 ENCOUNTER — Ambulatory Visit (INDEPENDENT_AMBULATORY_CARE_PROVIDER_SITE_OTHER): Payer: Medicare Other | Admitting: *Deleted

## 2021-01-18 DIAGNOSIS — I4891 Unspecified atrial fibrillation: Secondary | ICD-10-CM | POA: Diagnosis not present

## 2021-01-18 DIAGNOSIS — Z7901 Long term (current) use of anticoagulants: Secondary | ICD-10-CM

## 2021-01-18 LAB — POCT INR: INR: 1.9 — AB (ref 2.0–3.0)

## 2021-01-18 NOTE — Patient Instructions (Signed)
Take warfarin 1 1/2 tablets tonight then resume 1/2 pill everyday except 1 pill each Monday, Wednesday and Friday.  Repeat INR in 4 weeks.

## 2021-01-19 ENCOUNTER — Other Ambulatory Visit: Payer: Self-pay

## 2021-01-19 ENCOUNTER — Ambulatory Visit (HOSPITAL_COMMUNITY): Payer: Medicare Other

## 2021-01-19 ENCOUNTER — Encounter (HOSPITAL_COMMUNITY): Payer: Self-pay

## 2021-01-19 DIAGNOSIS — R262 Difficulty in walking, not elsewhere classified: Secondary | ICD-10-CM

## 2021-01-19 DIAGNOSIS — S81802A Unspecified open wound, left lower leg, initial encounter: Secondary | ICD-10-CM

## 2021-01-19 DIAGNOSIS — S81801A Unspecified open wound, right lower leg, initial encounter: Secondary | ICD-10-CM

## 2021-01-19 NOTE — Therapy (Signed)
[place], [place], [address] Phone: [phone]   Fax:  [phone]  Wound Care Therapy  Patient Details  Name: [name] MRN: [id number] Date of Birth: Jan 29, 1934 Referring Provider (PT): Jeanella Craze, DO   Encounter Date: 01/19/2021   PT End of Session - 01/19/21 1116    Visit Number 6    Number of Visits 29    Date for PT Re-Evaluation 03/14/21    Authorization Type UHC medicare, no auth or VL    Progress Note Due on Visit 10    PT Start Time 1003    PT Stop Time 1055    PT Time Calculation (min) 52 min    Activity Tolerance Patient limited by pain    Behavior During Therapy [facility] for tasks assessed/performed           Past Medical History:  Diagnosis Date  . Arthritis   . Atrial fibrillation ([place])   . Barrett's esophagus   . CAD (coronary artery disease)    echo 09-05-2010-EF nl, mod-severe dilated Left atrium; myoview 02-08-12-EF 52%, no ischemia  . Cancer [facility])    prostate - radiation only  . Diabetes mellitus without complication ([place])    "prediabetic"-no oral meds  . GERD (gastroesophageal reflux disease)   . Gout   . H/O transesophageal echocardiography (TEE) for monitoring 09/05/10   tee guided AT pace termination, did not proceed with cardioversion due to termination of the atrial flutter through his pacemaker  . History of cardioversion 02/09/13   electrical synchronized cardioversion, on flecainide and warfarin  . History of kidney stones    per pt - no actual diagnosisi  . History of stomach ulcers    many yrs ago  . HTN (hypertension)   . Pacemaker    medtronic  . Pacemaker 09-25-13  . Prostate cancer ([place]) 09-25-13   tx. with radiation only- dx. 2013    Past Surgical History:  Procedure Laterality Date  . APPENDECTOMY    . CARDIAC CATHETERIZATION  02/18/06   EF 65%, focal napkin ring-like lesion into the prox LAD otherwise normal coronaries  . CARDIOVERSION N/A 02/09/2013    Procedure: CARDIOVERSION;  Surgeon: Sanda Klein, MD;  Location: [place] [facility]OS[facility];  Service: Cardiovascular;  Laterality: N/A;  . CATARACT EXTRACTION W/PHACO Left 12/23/2020   Procedure: CATARACT EXTRACTION PHACO AND INTRAOCULAR LENS PLACEMENT ([place]);  Surgeon: [name], MD;  [facility];  Service: Ophthalmology;  Laterality: Left;  CDE 18.55  . CATARACT EXTRACTION W/PHACO Right 01/09/2021   Procedure: CATARACT EXTRACTION PHACO AND INTRAOCULAR LENS PLACEMENT ([facility]);  Surgeon: [name], MD;  [facility];  Service: Ophthalmology;  Laterality: Right;  CDE: 28.65  . PACEMAKER GENERATOR CHANGE N/A 07/27/2014   Procedure: PACEMAKER GENERATOR CHANGE;  Surgeon: Sanda Klein, MD;  Location: [place] CATH LAB;  Service: Cardiovascular;  Laterality: N/A;  . PACEMAKER INSERTION     medtronic adapta- Dr. Sallyanne Kuster follows- LOV 9'14  . TONSILLECTOMY    . TOTAL KNEE ARTHROPLASTY Right 10/05/2013   Procedure: RIGHT TOTAL KNEE ARTHROPLASTY;  Surgeon: Gearlean Alf, MD;  [facility];  Service: Orthopedics;  Laterality: Right;    There were no vitals filed for this visit.    Subjective Assessment - 01/19/21 1100    Subjective Pt arrived wiht dressings intact, reports apt wiht foot MD beginning next month.  Stated granddaughter used to work in [employer].    Pain Score 3  Pain Location Leg    Pain Orientation Right    Pain Descriptors / Indicators Throbbing    Pain Type Chronic pain                     Wound Therapy - 01/19/21 1100    Subjective Pt arrived wiht dressings intact, reports apt wiht foot MD beginning next month.  Stated granddaughter used to work in [employer].    Patient and Family Stated Goals to have wounds to heal    Prior Treatments neosporin, gauze, antibitics    Evaluation and Treatment Procedures Explained to Patient/Family Yes    Evaluation and Treatment Procedures agreed to    Wound Properties Date First Assessed:  01/03/21 Time First Assessed: 1430 Wound Type: Venous stasis ulcer Location: Pretibial Location Orientation: Proximal;Right Wound Description (Comments): right upper shin blister Present on Admission: Yes   Dressing Type Compression wrap;Impregnated gauze (bismuth)    Dressing Changed Changed    Dressing Status Old drainage    Dressing Change Frequency PRN    Site / Wound Assessment Pink;Red    % Wound base Red or Granulating 100%    Peri-wound Assessment Edema;Maceration    Margins Attached edges (approximated)    Drainage Amount Scant    Drainage Description Serosanguineous    Treatment Cleansed;Debridement (Selective)    Wound Properties Date First Assessed: 01/03/21 Time First Assessed: 1430 Wound Type: Diabetic ulcer Location: Pretibial Location Orientation: Distal;Right Wound Description (Comments): right lower wound Present on Admission: Yes   Wound Image View All Images View Images    Dressing Type Compression wrap   Santyl on 2x2, 4x4, gauze, profore lite   Dressing Changed Changed    Dressing Status Old drainage    Site / Wound Assessment Red;Yellow    % Wound base Yellow/Fibrinous Exudate 45%    % Wound base Black/Eschar 55%    Peri-wound Assessment Edema;Erythema (blanchable)    Wound Length (cm) 5.7 cm    Wound Width (cm) 3.9 cm    Wound Depth (cm) 0.3 cm    Wound Volume (cm^3) 6.67 cm^3    Wound Surface Area (cm^2) 22.23 cm^2    Margins Unattached edges (unapproximated)    Drainage Amount Moderate    Drainage Description Serosanguineous    Treatment Cleansed;Debridement (Selective)    Wound Properties Date First Assessed: 01/03/21 Wound Type: Diabetic ulcer Location: Tibial Location Orientation: Distal;Posterior;Right Wound Description (Comments): right posterior wound Present on Admission: Yes   Wound Image View All Images View Images    Dressing Type Compression wrap   Santyl on 2x2, 4x4, profore lite   Dressing Changed Changed    Dressing Status Old drainage     Dressing Change Frequency PRN    Site / Wound Assessment Red;Yellow    % Wound base Red or Granulating 65%    % Wound base Yellow/Fibrinous Exudate 35%    Peri-wound Assessment Maceration;Erythema (blanchable)    Wound Length (cm) 3.8 cm    Wound Width (cm) 4.2 cm    Wound Depth (cm) 0.3 cm    Wound Volume (cm^3) 4.79 cm^3    Wound Surface Area (cm^2) 15.96 cm^2    Margins Unattached edges (unapproximated)    Drainage Amount Moderate    Drainage Description Serosanguineous    Treatment Cleansed;Debridement (Selective)    Selective Debridement - Location Rt wounds    Selective Debridement - Tools Used Forceps;Scalpel    Selective Debridement - Tissue Removed devitalized tissue, necrotic tissue.  Wound Therapy - Clinical Statement Continues to have purlulent green drainage miexed wiht serosanginous.  Increased ease wiht selective debridement for removal of adherent slough with change to Santyl.  Pt reports some discomfort during selective debridmenet.  Pt stated he has to wait til end of month or beginning of next month for podiatrist, stated he might ask granddaughter to cut nails to be able to wear compression stockings on Lt, pt strongly encouraged to have professional trim nails to reduce risk of infection.  Reports of comfort at EOS.    Wound Therapy - Functional Problem List difficulty walking, bathing    Factors Delaying/Impairing Wound Healing Vascular compromise;Infection - systemic/local;Diabetes Mellitus   recent infection, heart disease   Wound Therapy - Frequency 2X / week    Wound Therapy - Current Recommendations PT    Wound Plan debridement and dressing change to wounds.  Educate on donning compression garment when it comes in.    Dressing  santyl on larger 2 wounds, xeroform on small area, 4X4 and profore lite                     PT Short Term Goals - 01/10/21 1646      PT SHORT TERM GOAL #1   Title Wound on left leg will be completely healed to reduce risk  of infection.    Time 4    Period Weeks    Status On-going    Target Date 01/31/21      PT SHORT TERM GOAL #2   Title Anterior lower wound on right leg will be 100% granulated to deomstrate improved wound healing.    Time 4    Period Weeks    Status On-going    Target Date 01/31/21      PT SHORT TERM GOAL #3   Title Posterior  wound on right leg will be 100% granulated to demonstrate improved wound healing.    Time 4    Period Weeks    Status On-going    Target Date 01/31/21      PT SHORT TERM GOAL #4   Title Patient will report importance of wearing daily compression garments to reduce swelling and risk of forming blisters    Time 4    Period Weeks    Status On-going    Target Date 02/28/21             PT Long Term Goals - 01/10/21 1646      PT LONG TERM GOAL #1   Time 8    Period Weeks    Status New      PT LONG TERM GOAL #2   Title Anterior lower wound on right leg will be completely healed to reduce risk of infection.    Time 8    Period Weeks    Status On-going      PT LONG TERM GOAL #3   Title Posterior wound on right leg will be completely healed to reduce risk of infection.    Time 8    Period Weeks    Status On-going      PT LONG TERM GOAL #4   Title Patietn will report <3/10 pain in legs due to wounds to reduce pain with walking    Status On-going                  Patient will benefit from skilled therapeutic intervention in order to improve the following deficits and impairments:  Visit Diagnosis: Leg wound, right, initial encounter  Open leg wound, left, initial encounter  Difficulty in walking, not elsewhere classified     Problem List Patient Active Problem List   Diagnosis Date Noted  . Cellulitis and abscess of right lower extremity 12/24/2020  . Diabetes mellitus without complication ([place])   . SSS (sick sinus syndrome) ([place]) 07/27/2014  . Pacemaker syndrome 07/27/2014  . Tachy-brady syndrome ([place]) 07/27/2014  .  Difficulty in walking(719.7) 11/03/2013  . Stiffness of right knee 11/03/2013  . Postoperative anemia due to acute blood loss 10/06/2013  . OA (osteoarthritis) of knee 10/05/2013  . Preoperative cardiovascular examination 07/20/2013  . Chronic diastolic heart failure ([place]) 04/29/2013  . Long QT interval 04/21/2013  . Volume overload 04/21/2013  . Long term current use of anticoagulant therapy 01/09/2013  . HTN (hypertension) 05/20/2011  . Pleural effusion 05/20/2011  . Lactic acid acidosis 05/18/2011  . Pacemaker 05/18/2011  . Anemia 05/18/2011  . Acute renal failure (ARF) ([place]) 05/18/2011  . Pneumonia 05/17/2011  . Fever 05/17/2011  . Atrial fibrillation ([place]) 05/17/2011  . Hyperglycemia 05/17/2011  . Bradycardia 05/17/2011  . Gout 05/17/2011  . Arthritis 05/17/2011  . Thrombocytopenia ([place]) 05/17/2011   Ihor Austin, LPTA/CLT; CBIS [phone]  Aldona Lento 01/19/2021, 11:23 AM  [facility] [address] [place], [place], [address] Phone: [phone]   Fax:  [phone]  Name: [name] MRN: [id number] Date of Birth: 1934-06-17

## 2021-01-20 ENCOUNTER — Encounter (HOSPITAL_COMMUNITY): Payer: Self-pay | Admitting: Physical Therapy

## 2021-01-20 ENCOUNTER — Ambulatory Visit (HOSPITAL_COMMUNITY): Payer: Medicare Other | Admitting: Physical Therapy

## 2021-01-20 DIAGNOSIS — R262 Difficulty in walking, not elsewhere classified: Secondary | ICD-10-CM | POA: Diagnosis not present

## 2021-01-20 DIAGNOSIS — S81802A Unspecified open wound, left lower leg, initial encounter: Secondary | ICD-10-CM

## 2021-01-20 DIAGNOSIS — S81801A Unspecified open wound, right lower leg, initial encounter: Secondary | ICD-10-CM

## 2021-01-20 NOTE — Therapy (Signed)
[place], [place], [address] Phone: [phone]   Fax:  [phone]  Wound Care Therapy  Patient Details  Name: [name] MRN: [id number] Date of Birth: Jan 24, 1934 Referring Provider (PT): Jeanella Craze, DO   Encounter Date: 01/20/2021   PT End of Session - 01/20/21 1538    Visit Number 7    Number of Visits 29    Date for PT Re-Evaluation 03/14/21    Authorization Type UHC medicare, no auth or VL    Progress Note Due on Visit 10    PT Start Time 1350    PT Stop Time 1430    PT Time Calculation (min) 40 min    Activity Tolerance Patient limited by pain    Behavior During Therapy [facility] for tasks assessed/performed           Past Medical History:  Diagnosis Date  . Arthritis   . Atrial fibrillation ([place])   . Barrett's esophagus   . CAD (coronary artery disease)    echo 09-05-2010-EF nl, mod-severe dilated Left atrium; myoview 02-08-12-EF 52%, no ischemia  . Cancer [facility])    prostate - radiation only  . Diabetes mellitus without complication ([place])    "prediabetic"-no oral meds  . GERD (gastroesophageal reflux disease)   . Gout   . H/O transesophageal echocardiography (TEE) for monitoring 09/05/10   tee guided AT pace termination, did not proceed with cardioversion due to termination of the atrial flutter through his pacemaker  . History of cardioversion 02/09/13   electrical synchronized cardioversion, on flecainide and warfarin  . History of kidney stones    per pt - no actual diagnosisi  . History of stomach ulcers    many yrs ago  . HTN (hypertension)   . Pacemaker    medtronic  . Pacemaker 09-25-13  . Prostate cancer ([place]) 09-25-13   tx. with radiation only- dx. 2013    Past Surgical History:  Procedure Laterality Date  . APPENDECTOMY    . CARDIAC CATHETERIZATION  02/18/06   EF 65%, focal napkin ring-like lesion into the prox LAD otherwise normal coronaries  . CARDIOVERSION N/A 02/09/2013    Procedure: CARDIOVERSION;  Surgeon: Sanda Klein, MD;  Location: [place] [facility];  Service: Cardiovascular;  Laterality: N/A;  . CATARACT EXTRACTION W/PHACO Left 12/23/2020   Procedure: CATARACT EXTRACTION PHACO AND INTRAOCULAR LENS PLACEMENT ([place]);  Surgeon: [name], MD;  [facility];  Service: Ophthalmology;  Laterality: Left;  CDE 18.55  . CATARACT EXTRACTION W/PHACO Right 01/09/2021   Procedure: CATARACT EXTRACTION PHACO AND INTRAOCULAR LENS PLACEMENT (I[facility]);  Surgeon: [name], MD;  Location: [facility];  Service: Ophthalmology;  Laterality: Right;  CDE: 28.65  . PACEMAKER GENERATOR CHANGE N/A 07/27/2014   Procedure: PACEMAKER GENERATOR CHANGE;  Surgeon: Sanda Klein, MD;  Location: [place] CATH LAB;  Service: Cardiovascular;  Laterality: N/A;  . PACEMAKER INSERTION     medtronic adapta- Dr. Sallyanne Kuster follows- LOV 9'14  . TONSILLECTOMY    . TOTAL KNEE ARTHROPLASTY Right 10/05/2013   Procedure: RIGHT TOTAL KNEE ARTHROPLASTY;  Surgeon: Gearlean Alf, MD;  [facility];  Service: Orthopedics;  Laterality: Right;    There were no vitals filed for this visit.               Wound Therapy - 01/20/21 0001    Subjective Patient states wounds have had some burning with new cream. Dressings felt fine.    Patient and Family Stated Goals to  have wounds to heal    Prior Treatments neosporin, gauze, antibitics    Faces Pain Scale Hurts little more    Evaluation and Treatment Procedures Explained to Patient/Family Yes    Evaluation and Treatment Procedures agreed to    Wound Properties Date First Assessed: 01/03/21 Time First Assessed: 1430 Wound Type: Venous stasis ulcer Location: Pretibial Location Orientation: Proximal;Right Wound Description (Comments): right upper shin blister Present on Admission: Yes   Dressing Type Compression wrap;Impregnated gauze (bismuth)    Dressing Changed Changed    Dressing Status Old drainage    Dressing Change Frequency PRN    Site / Wound  Assessment Red;Pink    % Wound base Red or Granulating 100%    Margins Attached edges (approximated)    Drainage Amount Scant    Drainage Description Serosanguineous    Treatment Cleansed;Debridement (Selective)    Wound Properties Date First Assessed: 01/03/21 Time First Assessed: 1430 Wound Type: Diabetic ulcer Location: Pretibial Location Orientation: Distal;Right Wound Description (Comments): right lower wound Present on Admission: Yes   Dressing Type Compression wrap   Santyl on 2x2, xeroform 4x4, gauze, profore lite   Dressing Changed Changed    Dressing Status Old drainage    Dressing Change Frequency PRN    Site / Wound Assessment Red;Yellow    % Wound base Red or Granulating 65%    % Wound base Yellow/Fibrinous Exudate 35%    Peri-wound Assessment Edema;Erythema (blanchable)    Margins Unattached edges (unapproximated)    Drainage Amount Moderate    Drainage Description Purulent;Serosanguineous    Treatment Cleansed;Debridement (Selective)    Wound Properties Date First Assessed: 01/03/21 Wound Type: Diabetic ulcer Location: Tibial Location Orientation: Distal;Posterior;Right Wound Description (Comments): right posterior wound Present on Admission: Yes   Dressing Type Compression wrap   Santyl on 2x2, xeroform 4x4, gauze, profore lite   Dressing Changed Changed    Dressing Status Old drainage    Dressing Change Frequency PRN    Site / Wound Assessment Red;Yellow    % Wound base Red or Granulating 75%    % Wound base Yellow/Fibrinous Exudate 25%    Peri-wound Assessment Maceration;Erythema (blanchable)    Margins Unattached edges (unapproximated)    Drainage Amount Moderate    Drainage Description Serosanguineous;Purulent    Treatment Cleansed;Debridement (Selective)    Selective Debridement - Location Rt wounds    Selective Debridement - Tools Used Forceps;Scalpel    Selective Debridement - Tissue Removed devitalized tissue, necrotic tissue.    Wound Therapy - Clinical  Statement Patient with minimal to moderate amounts of purulent drainage from anterior and posterior tibial wounds. Patient showing improvement in granulation tissue in both wounds with some adherent slough. Patient does not tolerate selective debridement well secondary to pain. Continued with santyl on 2x2 and added xeroform on top to avoid wound drying and abd pad if drainage continues. Also, continued with xeroform on small blisters on RLE. Applied compression wrap and patient stated it was comfortable at end of session.    Wound Therapy - Functional Problem List difficulty walking, bathing    Factors Delaying/Impairing Wound Healing Vascular compromise;Infection - systemic/local;Diabetes Mellitus   recent infection, heart disease   Wound Therapy - Frequency 2X / week    Wound Therapy - Current Recommendations PT    Wound Plan debridement and dressing change to wounds.  Educate on donning compression garment when it comes in.    Dressing  santyl on larger 2 wounds, xeroform on small area, 4X4, abd pad,  and profore lite                     PT Short Term Goals - 01/10/21 1646      PT SHORT TERM GOAL #1   Title Wound on left leg will be completely healed to reduce risk of infection.    Time 4    Period Weeks    Status On-going    Target Date 01/31/21      PT SHORT TERM GOAL #2   Title Anterior lower wound on right leg will be 100% granulated to deomstrate improved wound healing.    Time 4    Period Weeks    Status On-going    Target Date 01/31/21      PT SHORT TERM GOAL #3   Title Posterior  wound on right leg will be 100% granulated to demonstrate improved wound healing.    Time 4    Period Weeks    Status On-going    Target Date 01/31/21      PT SHORT TERM GOAL #4   Title Patient will report importance of wearing daily compression garments to reduce swelling and risk of forming blisters    Time 4    Period Weeks    Status On-going    Target Date 02/28/21              PT Long Term Goals - 01/10/21 1646      PT LONG TERM GOAL #1   Time 8    Period Weeks    Status New      PT LONG TERM GOAL #2   Title Anterior lower wound on right leg will be completely healed to reduce risk of infection.    Time 8    Period Weeks    Status On-going      PT LONG TERM GOAL #3   Title Posterior wound on right leg will be completely healed to reduce risk of infection.    Time 8    Period Weeks    Status On-going      PT LONG TERM GOAL #4   Title Patietn will report <3/10 pain in legs due to wounds to reduce pain with walking    Status On-going                 Plan - 01/20/21 1539    Clinical Impression Statement see above    Personal Factors and Comorbidities Comorbidity 1;Comorbidity 2;Comorbidity 3+    Comorbidities pacemaker, afib, on anticoagulents, vaicose veins.    Examination-Activity Limitations Locomotion Level;Hygiene/Grooming;Stand    Examination-Participation Restrictions Community Activity    Stability/Clinical Decision Making Stable/Uncomplicated    PT Frequency 3x / week    PT Duration 8 weeks    PT Treatment/Interventions ADLs/Self Care Home Management;Manual techniques;Other (comment)   wound care, modalities   PT Next Visit Plan continue with debridement and dressing changes - measure wounds    PT Home Exercise Plan ankle pumps    Consulted and Agree with Plan of Care Patient           Patient will benefit from skilled therapeutic intervention in order to improve the following deficits and impairments:  Other (comment),Pain,Increased edema,Decreased skin integrity,Decreased mobility,Decreased activity tolerance (wounds)  Visit Diagnosis: Leg wound, right, initial encounter  Open leg wound, left, initial encounter  Difficulty in walking, not elsewhere classified     Problem List Patient Active Problem List   Diagnosis Date Noted  . Cellulitis  and abscess of right lower extremity 12/24/2020  . Diabetes mellitus  without complication ([place])   . SSS (sick sinus syndrome) ([place]) 07/27/2014  . Pacemaker syndrome 07/27/2014  . Tachy-brady syndrome ([place]) 07/27/2014  . Difficulty in walking(719.7) 11/03/2013  . Stiffness of right knee 11/03/2013  . Postoperative anemia due to acute blood loss 10/06/2013  . OA (osteoarthritis) of knee 10/05/2013  . Preoperative cardiovascular examination 07/20/2013  . Chronic diastolic heart failure ([place]) 04/29/2013  . Long QT interval 04/21/2013  . Volume overload 04/21/2013  . Long term current use of anticoagulant therapy 01/09/2013  . HTN (hypertension) 05/20/2011  . Pleural effusion 05/20/2011  . Lactic acid acidosis 05/18/2011  . Pacemaker 05/18/2011  . Anemia 05/18/2011  . Acute renal failure (ARF) ([place]) 05/18/2011  . Pneumonia 05/17/2011  . Fever 05/17/2011  . Atrial fibrillation ([place]) 05/17/2011  . Hyperglycemia 05/17/2011  . Bradycardia 05/17/2011  . Gout 05/17/2011  . Arthritis 05/17/2011  . Thrombocytopenia ([place]) 05/17/2011    3:53 PM, 01/20/21 Mearl Latin PT, DPT Physical Therapist at [place], [place], [address] Phone: [phone]   Fax:  [phone]  Name: [name] MRN: [id number] Date of Birth: Nov 07, 1933

## 2021-01-24 ENCOUNTER — Encounter (HOSPITAL_COMMUNITY): Payer: Self-pay | Admitting: Physical Therapy

## 2021-01-24 ENCOUNTER — Ambulatory Visit (HOSPITAL_COMMUNITY): Payer: Medicare Other | Admitting: Physical Therapy

## 2021-01-24 ENCOUNTER — Other Ambulatory Visit: Payer: Self-pay

## 2021-01-24 DIAGNOSIS — S81801A Unspecified open wound, right lower leg, initial encounter: Secondary | ICD-10-CM

## 2021-01-24 DIAGNOSIS — R262 Difficulty in walking, not elsewhere classified: Secondary | ICD-10-CM | POA: Diagnosis not present

## 2021-01-24 DIAGNOSIS — S81802A Unspecified open wound, left lower leg, initial encounter: Secondary | ICD-10-CM

## 2021-01-24 NOTE — Therapy (Signed)
[place], [place], [address] Phone: [phone]   Fax:  [phone]  Wound Care Therapy  Patient Details  Name: [name] MRN: [id number] Date of Birth: [birth year] Referring Provider (PT): Jeanella Craze, DO   Encounter Date: 01/24/2021   PT End of Session - 01/24/21 0957    Visit Number 8    Number of Visits 29    Date for PT Re-Evaluation 03/14/21    Authorization Type UHC medicare, no auth or VL    Progress Note Due on Visit 10    PT Start Time 0916    PT Stop Time 0955    PT Time Calculation (min) 39 min    Activity Tolerance Patient limited by pain    Behavior During Therapy [facility] for tasks assessed/performed           Past Medical History:  Diagnosis Date  . Arthritis   . Atrial fibrillation ([place])   . Barrett's esophagus   . CAD (coronary artery disease)    echo 09-05-2010-EF nl, mod-severe dilated Left atrium; myoview 02-08-12-EF 52%, no ischemia  . Cancer [facility])    prostate - radiation only  . Diabetes mellitus without complication ([place])    "prediabetic"-no oral meds  . GERD (gastroesophageal reflux disease)   . Gout   . H/O transesophageal echocardiography (TEE) for monitoring 09/05/10   tee guided AT pace termination, did not proceed with cardioversion due to termination of the atrial flutter through his pacemaker  . History of cardioversion 02/09/13   electrical synchronized cardioversion, on flecainide and warfarin  . History of kidney stones    per pt - no actual diagnosisi  . History of stomach ulcers    many yrs ago  . HTN (hypertension)   . Pacemaker    medtronic  . Pacemaker 09-25-13  . Prostate cancer ([place]) 09-25-13   tx. with radiation only- dx. 2013    Past Surgical History:  Procedure Laterality Date  . APPENDECTOMY    . CARDIAC CATHETERIZATION  02/18/06   EF 65%, focal napkin ring-like lesion into the prox LAD otherwise normal coronaries  . CARDIOVERSION N/A 02/09/2013    Procedure: CARDIOVERSION;  Surgeon: Sanda Klein, MD;  Location: [place] [facility];  Service: Cardiovascular;  Laterality: N/A;  . CATARACT EXTRACTION W/PHACO Left 12/23/2020   Procedure: CATARACT EXTRACTION PHACO AND INTRAOCULAR LENS PLACEMENT ([place]);  Surgeon: [name], MD;  [facility];  Service: Ophthalmology;  Laterality: Left;  CDE 18.55  . CATARACT EXTRACTION W/PHACO Right 01/09/2021   Procedure: CATARACT EXTRACTION PHACO AND INTRAOCULAR LENS PLACEMENT (I[facility]);  Surgeon: [name], MD;  Location: [facility];  Service: Ophthalmology;  Laterality: Right;  CDE: 28.65  . PACEMAKER GENERATOR CHANGE N/A 07/27/2014   Procedure: PACEMAKER GENERATOR CHANGE;  Surgeon: Sanda Klein, MD;  Location: [place] CATH LAB;  Service: Cardiovascular;  Laterality: N/A;  . PACEMAKER INSERTION     medtronic adapta- Dr. Sallyanne Kuster follows- LOV 9'14  . TONSILLECTOMY    . TOTAL KNEE ARTHROPLASTY Right 10/05/2013   Procedure: RIGHT TOTAL KNEE ARTHROPLASTY;  Surgeon: Gearlean Alf, MD;  [facility];  Service: Orthopedics;  Laterality: Right;    There were no vitals filed for this visit.      [facility] PT Assessment - 01/24/21 0001      Assessment   Medical Diagnosis wounds on right leg    Referring Provider (PT) Pratick Manuella Ghazi, DO  Wound Therapy - 01/24/21 0001    Subjective Reports he sees MD tomorrow morning and gets his ABI in the evening. STates that it occasionally hurts where his wounds are. States that his bandage slipped down a bit.    Patient and Family Stated Goals to have wounds to heal    Prior Treatments neosporin, gauze, antibitics    Pain Scale 0-10    Pain Score 6     Faces Pain Scale --    Pain Type Chronic pain    Pain Location Leg    Pain Orientation Right    Pain Descriptors / Indicators Throbbing    Evaluation and Treatment Procedures Explained to Patient/Family Yes    Evaluation and Treatment Procedures agreed to    Wound Properties Date First Assessed:  01/03/21 Time First Assessed: 1430 Wound Type: Diabetic ulcer Location: Pretibial Location Orientation: Distal;Right Wound Description (Comments): right lower wound Present on Admission: Yes   Wound Image View All Images View Images    Dressing Type Compression wrap   santyl on gauze and xeroform   Dressing Changed Changed    Dressing Status Old drainage    Dressing Change Frequency PRN    Site / Wound Assessment Yellow;Red;Painful    % Wound base Red or Granulating 25%    % Wound base Yellow/Fibrinous Exudate 75%    Peri-wound Assessment Edema;Erythema (blanchable);Maceration    Wound Length (cm) 5.6 cm   was 5.7   Wound Width (cm) 4 cm   was 3.9   Wound Depth (cm) 0.3 cm   was .3   Wound Volume (cm^3) 6.72 cm^3    Wound Surface Area (cm^2) 22.4 cm^2    Margins Unattached edges (unapproximated)    Drainage Amount Moderate    Drainage Description No odor;Purulent;Green;Serosanguineous    Treatment Cleansed;Debridement (Selective)    Wound Properties Date First Assessed: 01/03/21 Wound Type: Diabetic ulcer Location: Tibial Location Orientation: Distal;Posterior;Right Wound Description (Comments): right posterior wound Present on Admission: Yes   Wound Image View All Images View Images    Dressing Type Compression wrap   santyl on gauze and xeroform   Dressing Changed Changed    Dressing Status Old drainage    Dressing Change Frequency PRN    Site / Wound Assessment Red;Yellow    % Wound base Red or Granulating 40%    % Wound base Yellow/Fibrinous Exudate 60%    Peri-wound Assessment Maceration;Erythema (blanchable);Edema    Wound Length (cm) 3.8 cm   was 3.8   Wound Width (cm) 4.2 cm   was 4.2   Wound Depth (cm) 0.3 cm   was .3   Wound Volume (cm^3) 4.79 cm^3    Wound Surface Area (cm^2) 15.96 cm^2    Margins Unattached edges (unapproximated)    Drainage Amount Moderate    Drainage Description Serosanguineous;Green;Purulent;No odor    Treatment Cleansed;Debridement (Selective)     Selective Debridement - Location Rt wounds    Selective Debridement - Tools Used Forceps    Selective Debridement - Tissue Removed devitalized tissue, necrotic tissue.    Wound Therapy - Clinical Statement Patient with 3 blisters noted in right lower leg, 2 of which are adjacent to current wounds. Decreased granulation tissue noted with very adherent slough and lateral wound. Tried to crosshatch the slough as tolerated but patient with significant pain limiting debridement and cross hatching. Posterior wound with less adherent slough and pain. Maceration noted surrounding both wounds, continued with abd pad and cut to fit dressings to wound  size. Patient to get ABI tomorrow, will follow up with patient next session to determine if ABI a concern at this time. Continued with Santyl xeroform on blisters and profore lite.    Wound Therapy - Functional Problem List difficulty walking, bathing    Factors Delaying/Impairing Wound Healing Vascular compromise;Infection - systemic/local;Diabetes Mellitus   recent infection, heart disease   Wound Therapy - Frequency 2X / week    Wound Therapy - Current Recommendations PT    Wound Plan debridement and dressing change to wounds.  Educate on donning compression garment when it comes in.    Dressing  santyl on larger 2 wounds (cut to size, moist guaze cut ot size), xeroform on small blister areas, 4X4, abd pad, and profore lite netting #5                     PT Short Term Goals - 01/10/21 1646      PT SHORT TERM GOAL #1   Title Wound on left leg will be completely healed to reduce risk of infection.    Time 4    Period Weeks    Status On-going    Target Date 01/31/21      PT SHORT TERM GOAL #2   Title Anterior lower wound on right leg will be 100% granulated to deomstrate improved wound healing.    Time 4    Period Weeks    Status On-going    Target Date 01/31/21      PT SHORT TERM GOAL #3   Title Posterior  wound on right leg will be 100%  granulated to demonstrate improved wound healing.    Time 4    Period Weeks    Status On-going    Target Date 01/31/21      PT SHORT TERM GOAL #4   Title Patient will report importance of wearing daily compression garments to reduce swelling and risk of forming blisters    Time 4    Period Weeks    Status On-going    Target Date 02/28/21             PT Long Term Goals - 01/10/21 1646      PT LONG TERM GOAL #1   Time 8    Period Weeks    Status New      PT LONG TERM GOAL #2   Title Anterior lower wound on right leg will be completely healed to reduce risk of infection.    Time 8    Period Weeks    Status On-going      PT LONG TERM GOAL #3   Title Posterior wound on right leg will be completely healed to reduce risk of infection.    Time 8    Period Weeks    Status On-going      PT LONG TERM GOAL #4   Title Patietn will report <3/10 pain in legs due to wounds to reduce pain with walking    Status On-going                 Plan - 01/24/21 0958    Clinical Impression Statement see above    Personal Factors and Comorbidities Comorbidity 1;Comorbidity 2;Comorbidity 3+    Comorbidities pacemaker, afib, on anticoagulents, vaicose veins.    Examination-Activity Limitations Locomotion Level;Hygiene/Grooming;Stand    Examination-Participation Restrictions Community Activity    Stability/Clinical Decision Making Stable/Uncomplicated    PT Frequency 3x / week    PT Duration 8  weeks    PT Treatment/Interventions ADLs/Self Care Home Management;Manual techniques;Other (comment)   wound care, modalities   PT Next Visit Plan continue with debridement and dressing changes - measure wounds    PT Home Exercise Plan ankle pumps    Consulted and Agree with Plan of Care Patient           Patient will benefit from skilled therapeutic intervention in order to improve the following deficits and impairments:  Other (comment),Pain,Increased edema,Decreased skin  integrity,Decreased mobility,Decreased activity tolerance (wounds)  Visit Diagnosis: Leg wound, right, initial encounter  Open leg wound, left, initial encounter  Difficulty in walking, not elsewhere classified     Problem List Patient Active Problem List   Diagnosis Date Noted  . Cellulitis and abscess of right lower extremity 12/24/2020  . Diabetes mellitus without complication ([place])   . SSS (sick sinus syndrome) ([place]) 07/27/2014  . Pacemaker syndrome 07/27/2014  . Tachy-brady syndrome ([place]) 07/27/2014  . Difficulty in walking(719.7) 11/03/2013  . Stiffness of right knee 11/03/2013  . Postoperative anemia due to acute blood loss 10/06/2013  . OA (osteoarthritis) of knee 10/05/2013  . Preoperative cardiovascular examination 07/20/2013  . Chronic diastolic heart failure ([place]) 04/29/2013  . Long QT interval 04/21/2013  . Volume overload 04/21/2013  . Long term current use of anticoagulant therapy 01/09/2013  . HTN (hypertension) 05/20/2011  . Pleural effusion 05/20/2011  . Lactic acid acidosis 05/18/2011  . Pacemaker 05/18/2011  . Anemia 05/18/2011  . Acute renal failure (ARF) ([place]) 05/18/2011  . Pneumonia 05/17/2011  . Fever 05/17/2011  . Atrial fibrillation ([place]) 05/17/2011  . Hyperglycemia 05/17/2011  . Bradycardia 05/17/2011  . Gout 05/17/2011  . Arthritis 05/17/2011  . Thrombocytopenia ([place]) 05/17/2011    10:25 AM, 01/24/21 Jerene Pitch, DPT Physical Therapy with [facility]  [phone] office  [place] [address] [place], [place], [address] Phone: [phone]   Fax:  [phone]  Name: NIVAAN DICENZO MRN: [id number] Date of Birth: 01-Apr-1934

## 2021-01-25 ENCOUNTER — Ambulatory Visit (HOSPITAL_COMMUNITY)
Admission: RE | Admit: 2021-01-25 | Discharge: 2021-01-25 | Disposition: A | Discharge status: Home / Self Care | Payer: Medicare Other | Source: Ambulatory Visit | Attending: Cardiology | Admitting: Cardiology

## 2021-01-25 DIAGNOSIS — I739 Peripheral vascular disease, unspecified: Secondary | ICD-10-CM | POA: Diagnosis present

## 2021-01-26 ENCOUNTER — Other Ambulatory Visit: Payer: Self-pay

## 2021-01-26 ENCOUNTER — Ambulatory Visit (HOSPITAL_COMMUNITY): Payer: Medicare Other | Admitting: Physical Therapy

## 2021-01-26 ENCOUNTER — Encounter (HOSPITAL_COMMUNITY): Payer: Self-pay | Admitting: Physical Therapy

## 2021-01-26 DIAGNOSIS — S81801A Unspecified open wound, right lower leg, initial encounter: Secondary | ICD-10-CM

## 2021-01-26 DIAGNOSIS — S81802A Unspecified open wound, left lower leg, initial encounter: Secondary | ICD-10-CM

## 2021-01-26 DIAGNOSIS — R262 Difficulty in walking, not elsewhere classified: Secondary | ICD-10-CM | POA: Diagnosis not present

## 2021-01-26 NOTE — Therapy (Signed)
[place], [place], [address] Phone: [phone]   Fax:  [phone]  Wound Care Therapy  Patient Details  Name: [name] MRN: [id number] Date of Birth: 1934-04-28 Referring Provider (PT): Jeanella Craze, DO   Encounter Date: 01/26/2021   PT End of Session - 01/26/21 1055    Visit Number 9    Number of Visits 29    Date for PT Re-Evaluation 03/14/21    Authorization Type UHC medicare, no auth or VL    Progress Note Due on Visit 10    PT Start Time 1000    PT Stop Time 1040    PT Time Calculation (min) 40 min    Activity Tolerance Patient limited by pain    Behavior During Therapy [facility] for tasks assessed/performed           Past Medical History:  Diagnosis Date  . Arthritis   . Atrial fibrillation ([place])   . Barrett's esophagus   . CAD (coronary artery disease)    echo 09-05-2010-EF nl, mod-severe dilated Left atrium; myoview 02-08-12-EF 52%, no ischemia  . Cancer [facility])    prostate - radiation only  . Diabetes mellitus without complication ([place])    "prediabetic"-no oral meds  . GERD (gastroesophageal reflux disease)   . Gout   . H/O transesophageal echocardiography (TEE) for monitoring 09/05/10   tee guided AT pace termination, did not proceed with cardioversion due to termination of the atrial flutter through his pacemaker  . History of cardioversion 02/09/13   electrical synchronized cardioversion, on flecainide and warfarin  . History of kidney stones    per pt - no actual diagnosisi  . History of stomach ulcers    many yrs ago  . HTN (hypertension)   . Pacemaker    medtronic  . Pacemaker 09-25-13  . Prostate cancer ([place]) 09-25-13   tx. with radiation only- dx. 2013    Past Surgical History:  Procedure Laterality Date  . APPENDECTOMY    . CARDIAC CATHETERIZATION  02/18/06   EF 65%, focal napkin ring-like lesion into the prox LAD otherwise normal coronaries  . CARDIOVERSION N/A 02/09/2013    Procedure: CARDIOVERSION;  Surgeon: Sanda Klein, MD;  Location: [place] ENDOS[facility];  Service: Cardiovascular;  Laterality: N/A;  . CATARACT EXTRACTION W/PHACO Left 12/23/2020   Procedure: CATARACT EXTRACTION PHACO AND INTRAOCULAR LENS PLACEMENT ([place]);  Surgeon: [name], MD;  [facility];  Service: Ophthalmology;  Laterality: Left;  CDE 18.55  . CATARACT EXTRACTION W/PHACO Right 01/09/2021   Procedure: CATARACT EXTRACTION PHACO AND INTRAOCULAR LENS PLACEMENT (IOC);  Surgeon: [name], MD;  Location: [facility];  Service: Ophthalmology;  Laterality: Right;  CDE: 28.65  . PACEMAKER GENERATOR CHANGE N/A 07/27/2014   Procedure: PACEMAKER GENERATOR CHANGE;  Surgeon: Sanda Klein, MD;  Location: [place] CATH LAB;  Service: Cardiovascular;  Laterality: N/A;  . PACEMAKER INSERTION     medtronic adapta- Dr. Sallyanne Kuster follows- LOV 9'14  . TONSILLECTOMY    . TOTAL KNEE ARTHROPLASTY Right 10/05/2013   Procedure: RIGHT TOTAL KNEE ARTHROPLASTY;  Surgeon: Gearlean Alf, MD;  [facility];  Service: Orthopedics;  Laterality: Right;    There were no vitals filed for this visit.      [facility] PT Assessment - 01/26/21 0001      Assessment   Medical Diagnosis wounds on right leg    Referring Provider (PT) Pratick Manuella Ghazi, DO  Wound Therapy - 01/26/21 0001    Subjective States he had his artery study completed yesterday and his leg hurt so much last night. Reports they told him they did not need to do an ABI and instead could do the dublex study which showed no significant findings on the right (mild abnormal findings on the left). Reports they changed his bandage yesterday. Reports he also saw his MD yesterday who said his A1C was elevated, he is waiting on the medication at the pharmacy.    Patient and Family Stated Goals to have wounds to heal    Prior Treatments neosporin, gauze, antibitics    Faces Pain Scale Hurts whole lot    Pain Type Chronic pain    Pain Location Leg     Pain Orientation Right    Pain Descriptors / Indicators Throbbing    Evaluation and Treatment Procedures Explained to Patient/Family Yes    Evaluation and Treatment Procedures agreed to    Wound Properties Date First Assessed: 01/03/21 Time First Assessed: 1430 Wound Type: Diabetic ulcer Location: Pretibial Location Orientation: Distal;Right Wound Description (Comments): right lower wound Present on Admission: Yes   Dressing Type Impregnated gauze (bismuth)   kerlix   Dressing Changed Changed    Dressing Status Old drainage    Dressing Change Frequency PRN    Site / Wound Assessment Dry;Yellow;Red;Pink;Bleeding    % Wound base Red or Granulating 35%    % Wound base Yellow/Fibrinous Exudate 65%    Peri-wound Assessment Edema;Erythema (blanchable)    Margins Unattached edges (unapproximated)    Drainage Amount Moderate    Drainage Description Serosanguineous;Purulent;Green    Treatment Cleansed;Debridement (Selective)    Wound Properties Date First Assessed: 01/03/21 Wound Type: Diabetic ulcer Location: Tibial Location Orientation: Distal;Posterior;Right Wound Description (Comments): right posterior wound Present on Admission: Yes   Dressing Type Impregnated gauze (bismuth)   kerlix   Dressing Changed Changed    Dressing Status Old drainage    Dressing Change Frequency PRN    Site / Wound Assessment Red;Yellow;Granulation tissue    % Wound base Red or Granulating 70%    % Wound base Yellow/Fibrinous Exudate 30%    Peri-wound Assessment Maceration;Erythema (blanchable)    Margins Unattached edges (unapproximated)    Drainage Amount Moderate    Drainage Description Serosanguineous;Purulent;Green    Treatment Cleansed;Debridement (Selective)    Selective Debridement - Location Rt wounds    Selective Debridement - Tools Used Forceps;Scalpel    Selective Debridement - Tissue Removed devitalized tissue, necrotic tissue.    Wound Therapy - Clinical Statement Patient with 2 remaining  blisters around current wounds. Skin along shin macerated, only applied thin xeroform layer to blisters and covered legs in Vaseline. Actual wound beds dry with continued adherent slough on lateral wound. Debrided as tolerated and cross hatched slough as tolerated as this was very painful. Improvement in granulation tissue noted in both wound beds. Continued with Santyl and moist gauze cut to fit. Green drainage noted but no discoloring in wound bed. Continued with profore lite despite patient reporting not significant findings with ultrasound study, will confirm findings with patient report prior to increasing compression. Continued swelling noted throughout leg. Patient's report of uncontrolled A1c likely contributing to decreased healing, will follow up with this and patient's new prescription medication for this.  If maceration continues may change blister dressings to more absorptive dressing. Will continue with current POC as tolerated    Wound Therapy - Functional Problem List difficulty walking, bathing  Factors Delaying/Impairing Wound Healing Vascular compromise;Infection - systemic/local;Diabetes Mellitus   recent infection, heart disease   Wound Therapy - Frequency 2X / week    Wound Therapy - Current Recommendations PT    Wound Plan debridement and dressing change to wounds.  Educate on donning compression garment when it comes in. If swelling continues trial profore with pt education (duplex negative for arterial disease in right LE), Add more absorptive layer to leg if maceration continues.    Dressing  santyl on larger 2 wounds (cut to size, moist guaze cut ot size), thin piece of  xeroform on small blister areas, vaseoline, profore lite netting #5                     PT Short Term Goals - 01/10/21 1646      PT SHORT TERM GOAL #1   Title Wound on left leg will be completely healed to reduce risk of infection.    Time 4    Period Weeks    Status On-going    Target Date  01/31/21      PT SHORT TERM GOAL #2   Title Anterior lower wound on right leg will be 100% granulated to deomstrate improved wound healing.    Time 4    Period Weeks    Status On-going    Target Date 01/31/21      PT SHORT TERM GOAL #3   Title Posterior  wound on right leg will be 100% granulated to demonstrate improved wound healing.    Time 4    Period Weeks    Status On-going    Target Date 01/31/21      PT SHORT TERM GOAL #4   Title Patient will report importance of wearing daily compression garments to reduce swelling and risk of forming blisters    Time 4    Period Weeks    Status On-going    Target Date 02/28/21             PT Long Term Goals - 01/10/21 1646      PT LONG TERM GOAL #1   Time 8    Period Weeks    Status New      PT LONG TERM GOAL #2   Title Anterior lower wound on right leg will be completely healed to reduce risk of infection.    Time 8    Period Weeks    Status On-going      PT LONG TERM GOAL #3   Title Posterior wound on right leg will be completely healed to reduce risk of infection.    Time 8    Period Weeks    Status On-going      PT LONG TERM GOAL #4   Title Patietn will report <3/10 pain in legs due to wounds to reduce pain with walking    Status On-going                 Plan - 01/26/21 1056    Clinical Impression Statement see above    Personal Factors and Comorbidities Comorbidity 1;Comorbidity 2;Comorbidity 3+    Comorbidities pacemaker, afib, on anticoagulents, vaicose veins.    Examination-Activity Limitations Locomotion Level;Hygiene/Grooming;Stand    Examination-Participation Restrictions Community Activity    Stability/Clinical Decision Making Stable/Uncomplicated    PT Frequency 3x / week    PT Duration 8 weeks    PT Treatment/Interventions ADLs/Self Care Home Management;Manual techniques;Other (comment)   wound care, modalities   PT  Next Visit Plan continue with debridement and dressing changes - measure  wounds    PT Home Exercise Plan ankle pumps    Consulted and Agree with Plan of Care Patient           Patient will benefit from skilled therapeutic intervention in order to improve the following deficits and impairments:  Other (comment),Pain,Increased edema,Decreased skin integrity,Decreased mobility,Decreased activity tolerance (wounds)  Visit Diagnosis: Leg wound, right, initial encounter  Open leg wound, left, initial encounter  Difficulty in walking, not elsewhere classified     Problem List Patient Active Problem List   Diagnosis Date Noted  . Cellulitis and abscess of right lower extremity 12/24/2020  . Diabetes mellitus without complication ([place])   . SSS (sick sinus syndrome) ([place]) 07/27/2014  . Pacemaker syndrome 07/27/2014  . Tachy-brady syndrome ([place]) 07/27/2014  . Difficulty in walking(719.7) 11/03/2013  . Stiffness of right knee 11/03/2013  . Postoperative anemia due to acute blood loss 10/06/2013  . OA (osteoarthritis) of knee 10/05/2013  . Preoperative cardiovascular examination 07/20/2013  . Chronic diastolic heart failure ([place]) 04/29/2013  . Long QT interval 04/21/2013  . Volume overload 04/21/2013  . Long term current use of anticoagulant therapy 01/09/2013  . HTN (hypertension) 05/20/2011  . Pleural effusion 05/20/2011  . Lactic acid acidosis 05/18/2011  . Pacemaker 05/18/2011  . Anemia 05/18/2011  . Acute renal failure (ARF) ([place]) 05/18/2011  . Pneumonia 05/17/2011  . Fever 05/17/2011  . Atrial fibrillation ([place]) 05/17/2011  . Hyperglycemia 05/17/2011  . Bradycardia 05/17/2011  . Gout 05/17/2011  . Arthritis 05/17/2011  . Thrombocytopenia ([place]) 05/17/2011    11:07 AM, 01/26/21 Jerene Pitch, DPT Physical Therapy with [facility]  [phone] office  [place] [address] [place], [place], [address] Phone: [phone]   Fax:  [phone]  Name: EMERSEN CARROLL MRN:  [id number] Date of Birth: Jul 18, 1934

## 2021-01-27 ENCOUNTER — Ambulatory Visit (HOSPITAL_COMMUNITY): Payer: Medicare Other | Admitting: Physical Therapy

## 2021-01-27 ENCOUNTER — Encounter (HOSPITAL_COMMUNITY): Payer: Self-pay | Admitting: Physical Therapy

## 2021-01-27 DIAGNOSIS — S81802A Unspecified open wound, left lower leg, initial encounter: Secondary | ICD-10-CM

## 2021-01-27 DIAGNOSIS — S81801A Unspecified open wound, right lower leg, initial encounter: Secondary | ICD-10-CM

## 2021-01-27 DIAGNOSIS — R262 Difficulty in walking, not elsewhere classified: Secondary | ICD-10-CM

## 2021-01-27 NOTE — Therapy (Signed)
[place], [place], [address] Phone: [phone]   Fax:  [phone]  Wound Care Therapy  Patient Details  Name: [name] MRN: [id number] Date of Birth: Apr 15, 1934 Referring Provider (PT): Jeanella Craze, DO   Progress Note Reporting Period 01/10/2021  to 01/27/2021  See note below for Objective Data and Assessment of Progress/Goals.    Date: 01/27/2021   PT End of Session - 01/27/21 1537    Visit Number 10    Date for PT Re-Evaluation 03/14/21    Authorization Type UHC medicare, no auth or VL    Progress Note Due on Visit 20    PT Start Time 1300    PT Stop Time 1350    PT Time Calculation (min) 50 min    Activity Tolerance Patient limited by pain    Behavior During Therapy WFL for tasks assessed/performed             Past Medical History:  Diagnosis Date  . Arthritis   . Atrial fibrillation ([place])   . Barrett's esophagus   . CAD (coronary artery disease)    echo 09-05-2010-EF nl, mod-severe dilated Left atrium; myoview 02-08-12-EF 52%, no ischemia  . Cancer [facility])    prostate - radiation only  . Diabetes mellitus without complication ([place])    "prediabetic"-no oral meds  . GERD (gastroesophageal reflux disease)   . Gout   . H/O transesophageal echocardiography (TEE) for monitoring 09/05/10   tee guided AT pace termination, did not proceed with cardioversion due to termination of the atrial flutter through his pacemaker  . History of cardioversion 02/09/13   electrical synchronized cardioversion, on flecainide and warfarin  . History of kidney stones    per pt - no actual diagnosisi  . History of stomach ulcers    many yrs ago  . HTN (hypertension)   . Pacemaker    medtronic  . Pacemaker 09-25-13  . Prostate cancer ([place]) 09-25-13   tx. with radiation only- dx. 2013    Past Surgical History:  Procedure Laterality Date  . APPENDECTOMY    . CARDIAC CATHETERIZATION  02/18/06   EF 65%, focal  napkin ring-like lesion into the prox LAD otherwise normal coronaries  . CARDIOVERSION N/A 02/09/2013   Procedure: CARDIOVERSION;  Surgeon: Sanda Klein, MD;  Location: [place] ENDOSCOPY;  Service: Cardiovascular;  Laterality: N/A;  . CATARACT EXTRACTION W/PHACO Left 12/23/2020   Procedure: CATARACT EXTRACTION PHACO AND INTRAOCULAR LENS PLACEMENT ([place]);  Surgeon: [name], MD;  [facility];  Service: Ophthalmology;  Laterality: Left;  CDE 18.55  . CATARACT EXTRACTION W/PHACO Right 01/09/2021   Procedure: CATARACT EXTRACTION PHACO AND INTRAOCULAR LENS PLACEMENT (IOC);  Surgeon: [name], MD;  [facility];  Service: Ophthalmology;  Laterality: Right;  CDE: 28.65  . PACEMAKER GENERATOR CHANGE N/A 07/27/2014   Procedure: PACEMAKER GENERATOR CHANGE;  Surgeon: Sanda Klein, MD;  Location: [place] CATH LAB;  Service: Cardiovascular;  Laterality: N/A;  . PACEMAKER INSERTION     medtronic adapta- Dr. Sallyanne Kuster follows- LOV 9'14  . TONSILLECTOMY    . TOTAL KNEE ARTHROPLASTY Right 10/05/2013   Procedure: RIGHT TOTAL KNEE ARTHROPLASTY;  Surgeon: Gearlean Alf, MD;  [facility];  Service: Orthopedics;  Laterality: Right;    There were no vitals filed for this visit.       Wound on Lt LE has healed, Rt LE is now progressing since we began using santyl this week.  Wound Therapy - 01/27/21 0001    Subjective PT states that his legs were a little sore from yesterday's treatment.  Noted weeping on his LT leg, he is unable to wear compression hose as his toenails are so long they poke holes in them request therapist to compression wrap LT LE    Patient and Family Stated Goals wounds to heal    Prior Treatments neosporin, gauze, antibitics    Pain Scale 0-10    Pain Score 6     Pain Type Chronic pain    Pain Location Leg    Pain Orientation Right    Pain Descriptors / Indicators Throbbing    Evaluation and Treatment Procedures Explained to Patient/Family Yes    Evaluation  and Treatment Procedures agreed to    Wound Properties Date First Assessed: 01/03/21 Time First Assessed: 1415 Wound Type: Venous stasis ulcer Location: Pretibial Location Orientation: Left;Proximal Wound Description (Comments): left anterior wound Present on Admission: Yes   Treatment --   small 1 cm open area noted on lateral aspect of LE, therapist placed xeroform f/b profore lite to prevent wound from increasing.   Wound Properties Date First Assessed: 01/03/21 Time First Assessed: 1430 Wound Type: Diabetic ulcer Location: Pretibial Location Orientation: Distal;Right Wound Description (Comments): right lower wound Present on Admission: Yes   Wound Image View All Images View Images    Dressing Type Compression wrap;Santyl;Gauze (Comment)    Dressing Changed Changed    Dressing Status Old drainage    Dressing Change Frequency PRN    Site / Wound Assessment Red;Yellow    % Wound base Red or Granulating 45%   was 40%   % Wound base Yellow/Fibrinous Exudate 55%   was 60%   Peri-wound Assessment Edema    Wound Length (cm) 5.7 cm    Wound Width (cm) 3.9 cm    Wound Depth (cm) 0.2 cm    Wound Volume (cm^3) 4.45 cm^3    Wound Surface Area (cm^2) 22.23 cm^2    Drainage Amount Moderate    Drainage Description Serous;Purulent    Treatment Cleansed;Debridement (Selective)    Wound Properties Date First Assessed: 01/03/21 Wound Type: Diabetic ulcer Location: Tibial Location Orientation: Distal;Posterior;Right Wound Description (Comments): right posterior wound Present on Admission: Yes   Wound Image View All Images View Images    Dressing Type Compression wrap;Gauze (Comment);Santyl    Dressing Changed Changed    Dressing Status Old drainage    Dressing Change Frequency PRN    Site / Wound Assessment Granulation tissue;Red;Yellow    % Wound base Red or Granulating 85%   was 40%   % Wound base Yellow/Fibrinous Exudate 15%   was 60%   Wound Length (cm) 3.6 cm    Wound Width (cm) 4.2 cm    Wound  Depth (cm) 0.2 cm    Wound Volume (cm^3) 3.02 cm^3    Wound Surface Area (cm^2) 15.12 cm^2    Drainage Amount Moderate    Drainage Description Serous    Treatment Cleansed;Debridement (Selective)    Selective Debridement - Location Wound beds on Rt LE    Selective Debridement - Tools Used Forceps;Scalpel    Selective Debridement - Tissue Removed slough    Wound Therapy - Clinical Statement PT requested to have LT LE compression bandage as he has not been able to get an appointment with the podiatrist to trim his nails and his nails ripped a hole in his compression garment.  Without compression he noticed his Lt LE  was weeping there is a small 100% granulated wound on the lateral aspect of his Lt LE. Therapist placed xeroform followed by profore lite compression garment.  Rt wounds are all improved as far as depth and granulation.  Wounds were debrided and dressed appropriately    Wound Therapy - Functional Problem List difficulty walking, bathing    Factors Delaying/Impairing Wound Healing Vascular compromise;Infection - systemic/local;Diabetes Mellitus   recent infection, heart disease   Wound Therapy - Frequency 2X / week    Wound Therapy - Current Recommendations PT    Wound Plan Let pt know about toeless compression garments.  Continue with debridement and dressing change using santyl for chemical debridement.    Dressing  santyl on both lateral and posterior wound f/b 4x4 and profore lite compression dressing with vasilne above and around wounds to prevent drying out.                     PT Short Term Goals - 01/10/21 1646      PT SHORT TERM GOAL #1   Title Wound on left leg will be completely healed to reduce risk of infection.    Time 4    Period Weeks    Status On-going    Target Date 01/31/21      PT SHORT TERM GOAL #2   Title Anterior lower wound on right leg will be 100% granulated to deomstrate improved wound healing.    Time 4    Period Weeks    Status On-going     Target Date 01/31/21      PT SHORT TERM GOAL #3   Title Posterior  wound on right leg will be 100% granulated to demonstrate improved wound healing.    Time 4    Period Weeks    Status On-going    Target Date 01/31/21      PT SHORT TERM GOAL #4   Title Patient will report importance of wearing daily compression garments to reduce swelling and risk of forming blisters    Time 4    Period Weeks    Status On-going    Target Date 02/28/21             PT Long Term Goals - 01/10/21 1646      PT LONG TERM GOAL #1   Time 8    Period Weeks    Status New      PT LONG TERM GOAL #2   Title Anterior lower wound on right leg will be completely healed to reduce risk of infection.    Time 8    Period Weeks    Status On-going      PT LONG TERM GOAL #3   Title Posterior wound on right leg will be completely healed to reduce risk of infection.    Time 8    Period Weeks    Status On-going      PT LONG TERM GOAL #4   Title Patietn will report <3/10 pain in legs due to wounds to reduce pain with walking    Status On-going                 Plan - 01/27/21 1534    Clinical Impression Statement see above    Personal Factors and Comorbidities Comorbidity 1;Comorbidity 2;Comorbidity 3+    Comorbidities pacemaker, afib, on anticoagulents, vaicose veins.    Examination-Activity Limitations Locomotion Level;Hygiene/Grooming;Stand    Examination-Participation Restrictions Community Activity    Stability/Clinical Decision Making  Stable/Uncomplicated    PT Frequency 3x / week    PT Duration 8 weeks    PT Treatment/Interventions ADLs/Self Care Home Management;Manual techniques;Other (comment)   wound care, modalities   PT Next Visit Plan continue with debridement and dressing changes - measure wounds    PT Home Exercise Plan ankle pumps    Consulted and Agree with Plan of Care Patient           Patient will benefit from skilled therapeutic intervention in order to improve the  following deficits and impairments:  Other (comment),Pain,Increased edema,Decreased skin integrity,Decreased mobility,Decreased activity tolerance (wounds)  Visit Diagnosis: Leg wound, right, initial encounter  Open leg wound, left, initial encounter  Difficulty in walking, not elsewhere classified     Problem List Patient Active Problem List   Diagnosis Date Noted  . Cellulitis and abscess of right lower extremity 12/24/2020  . Diabetes mellitus without complication ([place])   . SSS (sick sinus syndrome) ([place]) 07/27/2014  . Pacemaker syndrome 07/27/2014  . Tachy-brady syndrome ([place]) 07/27/2014  . Difficulty in walking(719.7) 11/03/2013  . Stiffness of right knee 11/03/2013  . Postoperative anemia due to acute blood loss 10/06/2013  . OA (osteoarthritis) of knee 10/05/2013  . Preoperative cardiovascular examination 07/20/2013  . Chronic diastolic heart failure ([place]) 04/29/2013  . Long QT interval 04/21/2013  . Volume overload 04/21/2013  . Long term current use of anticoagulant therapy 01/09/2013  . HTN (hypertension) 05/20/2011  . Pleural effusion 05/20/2011  . Lactic acid acidosis 05/18/2011  . Pacemaker 05/18/2011  . Anemia 05/18/2011  . Acute renal failure (ARF) ([place]) 05/18/2011  . Pneumonia 05/17/2011  . Fever 05/17/2011  . Atrial fibrillation ([place]) 05/17/2011  . Hyperglycemia 05/17/2011  . Bradycardia 05/17/2011  . Gout 05/17/2011  . Arthritis 05/17/2011  . Thrombocytopenia ([place]) 05/17/2011   Rayetta Humphrey, PT CLT [phone] 01/27/2021, 3:38 PM  [place] [address] [place], [place], [address] Phone: [phone]   Fax:  [phone]  Name: [name] MRN: [id number] Date of Birth: 1934-05-10

## 2021-01-30 ENCOUNTER — Ambulatory Visit (HOSPITAL_COMMUNITY): Payer: Medicare Other | Admitting: Physical Therapy

## 2021-01-30 ENCOUNTER — Other Ambulatory Visit: Payer: Self-pay

## 2021-01-30 DIAGNOSIS — S81801A Unspecified open wound, right lower leg, initial encounter: Secondary | ICD-10-CM

## 2021-01-30 DIAGNOSIS — S81802A Unspecified open wound, left lower leg, initial encounter: Secondary | ICD-10-CM

## 2021-01-30 DIAGNOSIS — R262 Difficulty in walking, not elsewhere classified: Secondary | ICD-10-CM | POA: Diagnosis not present

## 2021-01-30 NOTE — Therapy (Signed)
[place], [place], [address] Phone: [phone]   Fax:  [phone]  Wound Care Therapy  Patient Details  Name: [name] MRN: [id number] Date of Birth: [birth year] Referring Provider (PT): Jeanella Craze, DO   Encounter Date: 01/30/2021   PT End of Session - 01/30/21 1524    Visit Number 11    Number of Visits 29    Date for PT Re-Evaluation 03/14/21    Authorization Type UHC medicare, no auth or VL    Progress Note Due on Visit 20    PT Start Time 1335   pt arrived late for appt.   PT Stop Time 1405    PT Time Calculation (min) 30 min    Activity Tolerance Patient limited by pain    Behavior During Therapy [facility] for tasks assessed/performed           Past Medical History:  Diagnosis Date  . Arthritis   . Atrial fibrillation ([employer])   . Barrett's esophagus   . CAD (coronary artery disease)    echo 09-05-2010-EF nl, mod-severe dilated Left atrium; myoview 02-08-12-EF 52%, no ischemia  . Cancer [facility])    prostate - radiation only  . Diabetes mellitus without complication ([place])    "prediabetic"-no oral meds  . GERD (gastroesophageal reflux disease)   . Gout   . H/O transesophageal echocardiography (TEE) for monitoring 09/05/10   tee guided AT pace termination, did not proceed with cardioversion due to termination of the atrial flutter through his pacemaker  . History of cardioversion 02/09/13   electrical synchronized cardioversion, on flecainide and warfarin  . History of kidney stones    per pt - no actual diagnosisi  . History of stomach ulcers    many yrs ago  . HTN (hypertension)   . Pacemaker    medtronic  . Pacemaker 09-25-13  . Prostate cancer ([place]) 09-25-13   tx. with radiation only- dx. 2013    Past Surgical History:  Procedure Laterality Date  . APPENDECTOMY    . CARDIAC CATHETERIZATION  02/18/06   EF 65%, focal napkin ring-like lesion into the prox LAD otherwise normal coronaries  .  CARDIOVERSION N/A 02/09/2013   Procedure: CARDIOVERSION;  Surgeon: Sanda Klein, MD;  Location: [place] ENDOS[facility];  Service: Cardiovascular;  Laterality: N/A;  . CATARACT EXTRACTION W/PHACO Left 12/23/2020   Procedure: CATARACT EXTRACTION PHACO AND INTRAOCULAR LENS PLACEMENT ([place]);  Surgeon: [name], MD;  Location[facility];  Service: Ophthalmology;  Laterality: Left;  CDE 18.55  . CATARACT EXTRACTION W/PHACO Right 01/09/2021   Procedure: CATARACT EXTRACTION PHACO AND INTRAOCULAR LENS PLACEMENT ([facility]);  Surgeon: [name], MD;  Location: [facility];  Service: Ophthalmology;  Laterality: Right;  CDE: 28.65  . PACEMAKER GENERATOR CHANGE N/A 07/27/2014   Procedure: PACEMAKER GENERATOR CHANGE;  Surgeon: Sanda Klein, MD;  Location: [place] CATH LAB;  Service: Cardiovascular;  Laterality: N/A;  . PACEMAKER INSERTION     medtronic adapta- Dr. Sallyanne Kuster follows- LOV 9'14  . TONSILLECTOMY    . TOTAL KNEE ARTHROPLASTY Right 10/05/2013   Procedure: RIGHT TOTAL KNEE ARTHROPLASTY;  Surgeon: Gearlean Alf, MD;  Location[facility];  Service: Orthopedics;  Laterality: Right;    There were no vitals filed for this visit.               Wound Therapy - 01/30/21 0001    Subjective Pt reports no issues currently.  Goes to the podiatrist next week to get his nails  cut.    Patient and Family Stated Goals wounds to heal    Prior Treatments neosporin, gauze, antibitics    Pain Scale 0-10    Pain Score 6     Pain Type Chronic pain    Pain Location Leg    Pain Orientation Right    Pain Descriptors / Indicators Throbbing    Evaluation and Treatment Procedures Explained to Patient/Family Yes    Evaluation and Treatment Procedures agreed to    Wound Properties Date First Assessed: 01/03/21 Time First Assessed: 1430 Wound Type: Diabetic ulcer Location: Pretibial Location Orientation: Distal;Right Wound Description (Comments): right lower wound Present on Admission: Yes   Dressing Type Compression wrap     Dressing Changed Changed    Dressing Status Old drainage    Dressing Change Frequency PRN    Site / Wound Assessment Red;Yellow;Pink    % Wound base Red or Granulating 50%    % Wound base Yellow/Fibrinous Exudate 50%    Peri-wound Assessment Edema    Margins Unattached edges (unapproximated)    Drainage Amount Minimal    Drainage Description Serosanguineous    Treatment Cleansed;Debridement (Selective)    Wound Properties Date First Assessed: 01/03/21 Wound Type: Diabetic ulcer Location: Tibial Location Orientation: Distal;Posterior;Right Wound Description (Comments): right posterior wound Present on Admission: Yes   Dressing Type Compression wrap    Dressing Changed Changed    Dressing Status Old drainage    Dressing Change Frequency PRN    Site / Wound Assessment Pink;Red;Yellow    % Wound base Red or Granulating 85%    % Wound base Yellow/Fibrinous Exudate 15%    Drainage Amount Scant    Drainage Description Serosanguineous    Treatment Cleansed;Debridement (Selective)    Selective Debridement - Location Wound beds on Rt LE    Selective Debridement - Tools Used Forceps;Scalpel    Selective Debridement - Tissue Removed slough    Wound Therapy - Clinical Statement Lt LE profore applied Friday so did not disturb this dressing.  Rt LE wounds slowly improving with good tolerance of debridement this session.  Lateral wound with more granualtion than medial one.  Edges remain epibole on most sides making approximation slow and difficult.  Pt is not tolerant of debridement around edges.  Continued with santyl on wound beds.    Wound Therapy - Functional Problem List difficulty walking, bathing    Factors Delaying/Impairing Wound Healing Vascular compromise;Infection - systemic/local;Diabetes Mellitus   recent infection, heart disease   Wound Therapy - Frequency 2X / week    Wound Therapy - Current Recommendations PT    Wound Plan Continue with debridement and dressing change using santyl for  chemical debridement.  Change profore on Lt Friday    Dressing  santyl on both lateral and posterior wound f/b 4x4 and profore lite compression dressing with vasilne above and around wounds to prevent drying out.                     PT Short Term Goals - 01/10/21 1646      PT SHORT TERM GOAL #1   Title Wound on left leg will be completely healed to reduce risk of infection.    Time 4    Period Weeks    Status On-going    Target Date 01/31/21      PT SHORT TERM GOAL #2   Title Anterior lower wound on right leg will be 100% granulated to deomstrate improved wound healing.  Time 4    Period Weeks    Status On-going    Target Date 01/31/21      PT SHORT TERM GOAL #3   Title Posterior  wound on right leg will be 100% granulated to demonstrate improved wound healing.    Time 4    Period Weeks    Status On-going    Target Date 01/31/21      PT SHORT TERM GOAL #4   Title Patient will report importance of wearing daily compression garments to reduce swelling and risk of forming blisters    Time 4    Period Weeks    Status On-going    Target Date 02/28/21             PT Long Term Goals - 01/10/21 1646      PT LONG TERM GOAL #1   Time 8    Period Weeks    Status New      PT LONG TERM GOAL #2   Title Anterior lower wound on right leg will be completely healed to reduce risk of infection.    Time 8    Period Weeks    Status On-going      PT LONG TERM GOAL #3   Title Posterior wound on right leg will be completely healed to reduce risk of infection.    Time 8    Period Weeks    Status On-going      PT LONG TERM GOAL #4   Title Patietn will report <3/10 pain in legs due to wounds to reduce pain with walking    Status On-going                  Patient will benefit from skilled therapeutic intervention in order to improve the following deficits and impairments:     Visit Diagnosis: Leg wound, right, initial encounter  Open leg wound, left,  initial encounter  Difficulty in walking, not elsewhere classified     Problem List Patient Active Problem List   Diagnosis Date Noted  . Cellulitis and abscess of right lower extremity 12/24/2020  . Diabetes mellitus without complication ([place])   . SSS (sick sinus syndrome) ([place]) 07/27/2014  . Pacemaker syndrome 07/27/2014  . Tachy-brady syndrome ([place]) 07/27/2014  . Difficulty in walking(719.7) 11/03/2013  . Stiffness of right knee 11/03/2013  . Postoperative anemia due to acute blood loss 10/06/2013  . OA (osteoarthritis) of knee 10/05/2013  . Preoperative cardiovascular examination 07/20/2013  . Chronic diastolic heart failure ([place]) 04/29/2013  . Long QT interval 04/21/2013  . Volume overload 04/21/2013  . Long term current use of anticoagulant therapy 01/09/2013  . HTN (hypertension) 05/20/2011  . Pleural effusion 05/20/2011  . Lactic acid acidosis 05/18/2011  . Pacemaker 05/18/2011  . Anemia 05/18/2011  . Acute renal failure (ARF) ([place]) 05/18/2011  . Pneumonia 05/17/2011  . Fever 05/17/2011  . Atrial fibrillation ([place]) 05/17/2011  . Hyperglycemia 05/17/2011  . Bradycardia 05/17/2011  . Gout 05/17/2011  . Arthritis 05/17/2011  . Thrombocytopenia ([place]) 05/17/2011   Teena Irani, PTA/CLT [phone]  Teena Irani 01/30/2021, 3:26 PM  [place] [address] [place], [place], [address] Phone: [phone]   Fax:  [phone]  Name: [name] MRN: [id number] Date of Birth: [birth year]

## 2021-01-31 ENCOUNTER — Ambulatory Visit (HOSPITAL_COMMUNITY): Payer: Medicare Other | Admitting: Physical Therapy

## 2021-02-01 ENCOUNTER — Other Ambulatory Visit: Payer: Self-pay

## 2021-02-01 ENCOUNTER — Ambulatory Visit (HOSPITAL_COMMUNITY): Payer: Medicare Other | Admitting: Physical Therapy

## 2021-02-01 ENCOUNTER — Encounter (HOSPITAL_COMMUNITY): Payer: Self-pay | Admitting: Physical Therapy

## 2021-02-01 DIAGNOSIS — R262 Difficulty in walking, not elsewhere classified: Secondary | ICD-10-CM

## 2021-02-01 DIAGNOSIS — M79604 Pain in right leg: Secondary | ICD-10-CM

## 2021-02-01 DIAGNOSIS — S81801A Unspecified open wound, right lower leg, initial encounter: Secondary | ICD-10-CM

## 2021-02-01 NOTE — Therapy (Signed)
[place], [place], [address] Phone: [phone]   Fax:  [phone]  Wound Care Therapy  Patient Details  Name: [name] MRN: [id number] Date of Birth: 1934/06/01 Referring Provider (PT): Jeanella Craze, DO   Encounter Date: 02/01/2021   PT End of Session - 02/01/21 1559    Visit Number 12    Number of Visits 29    Date for PT Re-Evaluation 03/14/21    Authorization Type UHC medicare, no auth or VL    PT Start Time 1444    PT Stop Time 1530    PT Time Calculation (min) 46 min    Activity Tolerance Patient limited by pain    Behavior During Therapy [facility] for tasks assessed/performed           Past Medical History:  Diagnosis Date  . Arthritis   . Atrial fibrillation ([place])   . Barrett's esophagus   . CAD (coronary artery disease)    echo 09-05-2010-EF nl, mod-severe dilated Left atrium; myoview 02-08-12-EF 52%, no ischemia  . Cancer [facility])    prostate - radiation only  . Diabetes mellitus without complication ([place])    "prediabetic"-no oral meds  . GERD (gastroesophageal reflux disease)   . Gout   . H/O transesophageal echocardiography (TEE) for monitoring 09/05/10   tee guided AT pace termination, did not proceed with cardioversion due to termination of the atrial flutter through his pacemaker  . History of cardioversion 02/09/13   electrical synchronized cardioversion, on flecainide and warfarin  . History of kidney stones    per pt - no actual diagnosisi  . History of stomach ulcers    many yrs ago  . HTN (hypertension)   . Pacemaker    medtronic  . Pacemaker 09-25-13  . Prostate cancer ([place]) 09-25-13   tx. with radiation only- dx. 2013    Past Surgical History:  Procedure Laterality Date  . APPENDECTOMY    . CARDIAC CATHETERIZATION  02/18/06   EF 65%, focal napkin ring-like lesion into the prox LAD otherwise normal coronaries  . CARDIOVERSION N/A 02/09/2013   Procedure: CARDIOVERSION;   Surgeon: Sanda Klein, MD;  Location: [place] ENDOSCOPY;  Service: Cardiovascular;  Laterality: N/A;  . CATARACT EXTRACTION W/PHACO Left 12/23/2020   Procedure: CATARACT EXTRACTION PHACO AND INTRAOCULAR LENS PLACEMENT ([place]);  Surgeon: [name], MD;  Location[facility];  Service: Ophthalmology;  Laterality: Left;  CDE 18.55  . CATARACT EXTRACTION W/PHACO Right 01/09/2021   Procedure: CATARACT EXTRACTION PHACO AND INTRAOCULAR LENS PLACEMENT ([facility]);  Surgeon: [name], MD;  [facility];  Service: Ophthalmology;  Laterality: Right;  CDE: 28.65  . PACEMAKER GENERATOR CHANGE N/A 07/27/2014   Procedure: PACEMAKER GENERATOR CHANGE;  Surgeon: Sanda Klein, MD;  Location: [place] CATH LAB;  Service: Cardiovascular;  Laterality: N/A;  . PACEMAKER INSERTION     medtronic adapta- Dr. Sallyanne Kuster follows- LOV 9'14  . TONSILLECTOMY    . TOTAL KNEE ARTHROPLASTY Right 10/05/2013   Procedure: RIGHT TOTAL KNEE ARTHROPLASTY;  Surgeon: Gearlean Alf, MD;  [facility];  Service: Orthopedics;  Laterality: Right;    There were no vitals filed for this visit.               Wound Therapy - 02/01/21 0001    Subjective PT voices that he is having minimal pain and his legs feel good    Patient and Family Stated Goals wounds to heal    Prior Treatments neosporin, gauze,  antibitics    Pain Scale 0-10    Pain Score 2     Pain Type Chronic pain    Pain Location Leg    Pain Orientation Right    Pain Descriptors / Indicators Discomfort    Pain Intervention(s) Emotional support    Evaluation and Treatment Procedures Explained to Patient/Family Yes    Evaluation and Treatment Procedures agreed to    Wound Properties Date First Assessed: 01/03/21 Time First Assessed: 1430 Wound Type: Diabetic ulcer Location: Pretibial Location Orientation: Distal;Right Wound Description (Comments): right lower wound Present on Admission: Yes   Dressing Type Compression wrap;Gauze (Comment);Santyl    Dressing Changed  Changed    Dressing Status Old drainage    Dressing Change Frequency PRN    Site / Wound Assessment Red;Yellow    % Wound base Red or Granulating 65%    % Wound base Yellow/Fibrinous Exudate 35%    Margins Attached edges (approximated)    Drainage Amount Moderate    Drainage Description Purulent;Serous   slight green tint   Treatment Cleansed;Debridement (Selective)    Wound Properties Date First Assessed: 01/03/21 Wound Type: Diabetic ulcer Location: Tibial Location Orientation: Distal;Posterior;Right Wound Description (Comments): right posterior wound Present on Admission: Yes   Dressing Type Compression wrap;Gauze (Comment);Santyl    Dressing Changed Changed    Dressing Status Old drainage    Dressing Change Frequency PRN    Site / Wound Assessment Red;Yellow    % Wound base Red or Granulating 85%    % Wound base Yellow/Fibrinous Exudate 15%    Drainage Amount Minimal    Drainage Description Serous    Treatment Cleansed;Debridement (Selective)    Selective Debridement - Location wound beds of both wounds    Selective Debridement - Tools Used Forceps;Scalpel    Selective Debridement - Tissue Removed slough    Wound Therapy - Clinical Statement PT anterior wound debrided well today. Posterior wound continues to approximate.  No edema noted in LE.  Debridement continues to be painful to pt therefore use of santyl is still necessary.  Therapist educated pt that there are toeless compresssion garments, however, pt is not interested at this time.    Wound Therapy - Functional Problem List difficulty walking, bathing    Factors Delaying/Impairing Wound Healing Vascular compromise;Infection - systemic/local;Diabetes Mellitus   recent infection, heart disease   Wound Therapy - Frequency 3X / week    Wound Therapy - Current Recommendations PT    Dressing  santyl on both wounds, gauze and profore lite.                     PT Short Term Goals - 01/10/21 1646      PT SHORT TERM  GOAL #1   Title Wound on left leg will be completely healed to reduce risk of infection.    Time 4    Period Weeks    Status On-going    Target Date 01/31/21      PT SHORT TERM GOAL #2   Title Anterior lower wound on right leg will be 100% granulated to deomstrate improved wound healing.    Time 4    Period Weeks    Status On-going    Target Date 01/31/21      PT SHORT TERM GOAL #3   Title Posterior  wound on right leg will be 100% granulated to demonstrate improved wound healing.    Time 4    Period Weeks    Status On-going  Target Date 01/31/21      PT SHORT TERM GOAL #4   Title Patient will report importance of wearing daily compression garments to reduce swelling and risk of forming blisters    Time 4    Period Weeks    Status On-going    Target Date 02/28/21             PT Long Term Goals - 01/10/21 1646      PT LONG TERM GOAL #1   Time 8    Period Weeks    Status New      PT LONG TERM GOAL #2   Title Anterior lower wound on right leg will be completely healed to reduce risk of infection.    Time 8    Period Weeks    Status On-going      PT LONG TERM GOAL #3   Title Posterior wound on right leg will be completely healed to reduce risk of infection.    Time 8    Period Weeks    Status On-going      PT LONG TERM GOAL #4   Title Patietn will report <3/10 pain in legs due to wounds to reduce pain with walking    Status On-going                 Plan - 02/01/21 1600    Clinical Impression Statement see above    Personal Factors and Comorbidities Comorbidity 1;Comorbidity 2;Comorbidity 3+    Comorbidities pacemaker, afib, on anticoagulents, vaicose veins.    Examination-Activity Limitations Locomotion Level;Hygiene/Grooming;Stand    Examination-Participation Restrictions Community Activity    Stability/Clinical Decision Making Stable/Uncomplicated    PT Frequency 3x / week    PT Duration 8 weeks    PT Treatment/Interventions ADLs/Self Care  Home Management;Manual techniques;Other (comment)   wound care, modalities   PT Next Visit Plan continue with debridement and dressing changes - measure wounds next session, Change Lt profore .   PT Home Exercise Plan ankle pumps    Consulted and Agree with Plan of Care Patient           Patient will benefit from skilled therapeutic intervention in order to improve the following deficits and impairments:  Other (comment),Pain,Increased edema,Decreased skin integrity,Decreased mobility,Decreased activity tolerance (wounds)  Visit Diagnosis: Leg wound, right, initial encounter  Difficulty in walking, not elsewhere classified  Pain in right leg     Problem List Patient Active Problem List   Diagnosis Date Noted  . Cellulitis and abscess of right lower extremity 12/24/2020  . Diabetes mellitus without complication ([place])   . SSS (sick sinus syndrome) ([place]) 07/27/2014  . Pacemaker syndrome 07/27/2014  . Tachy-brady syndrome ([place]) 07/27/2014  . Difficulty in walking(719.7) 11/03/2013  . Stiffness of right knee 11/03/2013  . Postoperative anemia due to acute blood loss 10/06/2013  . OA (osteoarthritis) of knee 10/05/2013  . Preoperative cardiovascular examination 07/20/2013  . Chronic diastolic heart failure ([place]) 04/29/2013  . Long QT interval 04/21/2013  . Volume overload 04/21/2013  . Long term current use of anticoagulant therapy 01/09/2013  . HTN (hypertension) 05/20/2011  . Pleural effusion 05/20/2011  . Lactic acid acidosis 05/18/2011  . Pacemaker 05/18/2011  . Anemia 05/18/2011  . Acute renal failure (ARF) ([place]) 05/18/2011  . Pneumonia 05/17/2011  . Fever 05/17/2011  . Atrial fibrillation ([place]) 05/17/2011  . Hyperglycemia 05/17/2011  . Bradycardia 05/17/2011  . Gout 05/17/2011  . Arthritis 05/17/2011  . Thrombocytopenia ([place]) 05/17/2011   Caren Griffins  Joneen Caraway, [place] [phone] 02/01/2021, 4:01 PM  [place], [place], [address] Phone: [phone]   Fax:  [phone]  Name: ARGYLE GUSTAFSON MRN: [id number] Date of Birth: [birth year]

## 2021-02-02 ENCOUNTER — Ambulatory Visit (HOSPITAL_COMMUNITY): Payer: Medicare Other | Admitting: Physical Therapy

## 2021-02-03 ENCOUNTER — Other Ambulatory Visit: Payer: Self-pay

## 2021-02-03 ENCOUNTER — Ambulatory Visit (HOSPITAL_COMMUNITY): Payer: Medicare Other | Attending: Internal Medicine | Admitting: Physical Therapy

## 2021-02-03 DIAGNOSIS — R262 Difficulty in walking, not elsewhere classified: Secondary | ICD-10-CM | POA: Diagnosis present

## 2021-02-03 DIAGNOSIS — S81801A Unspecified open wound, right lower leg, initial encounter: Secondary | ICD-10-CM | POA: Diagnosis not present

## 2021-02-03 DIAGNOSIS — S81802A Unspecified open wound, left lower leg, initial encounter: Secondary | ICD-10-CM | POA: Diagnosis present

## 2021-02-03 DIAGNOSIS — M79604 Pain in right leg: Secondary | ICD-10-CM | POA: Diagnosis present

## 2021-02-03 NOTE — Therapy (Signed)
[place], [place], [address] Phone: [phone]   Fax:  [phone]  Wound Care Therapy  Patient Details  Name: [name] MRN: [id number] Date of Birth: 1934/03/13 Referring Provider (PT): Jeanella Craze, DO   Encounter Date: 02/03/2021   PT End of Session - 02/03/21 1532    Visit Number 13    Number of Visits 29    Date for PT Re-Evaluation 03/14/21    Authorization Type UHC medicare, no auth or VL    Progress Note Due on Visit 20    PT Start Time 1415    PT Stop Time 1455    PT Time Calculation (min) 40 min           Past Medical History:  Diagnosis Date  . Arthritis   . Atrial fibrillation ([place])   . Barrett's esophagus   . CAD (coronary artery disease)    echo 09-05-2010-EF nl, mod-severe dilated Left atrium; myoview 02-08-12-EF 52%, no ischemia  . Cancer [facility])    prostate - radiation only  . Diabetes mellitus without complication ([place])    "prediabetic"-no oral meds  . GERD (gastroesophageal reflux disease)   . Gout   . H/O transesophageal echocardiography (TEE) for monitoring 09/05/10   tee guided AT pace termination, did not proceed with cardioversion due to termination of the atrial flutter through his pacemaker  . History of cardioversion 02/09/13   electrical synchronized cardioversion, on flecainide and warfarin  . History of kidney stones    per pt - no actual diagnosisi  . History of stomach ulcers    many yrs ago  . HTN (hypertension)   . Pacemaker    medtronic  . Pacemaker 09-25-13  . Prostate cancer ([place]) 09-25-13   tx. with radiation only- dx. 2013    Past Surgical History:  Procedure Laterality Date  . APPENDECTOMY    . CARDIAC CATHETERIZATION  02/18/06   EF 65%, focal napkin ring-like lesion into the prox LAD otherwise normal coronaries  . CARDIOVERSION N/A 02/09/2013   Procedure: CARDIOVERSION;  Surgeon: Sanda Klein, MD;  Location: [facility];  Service: Cardiovascular;   Laterality: N/A;  . CATARACT EXTRACTION W/PHACO Left 12/23/2020   Procedure: CATARACT EXTRACTION PHACO AND INTRAOCULAR LENS PLACEMENT ([place]);  Surgeon: [name], MD;  [facility];  Service: Ophthalmology;  Laterality: Left;  CDE 18.55  . CATARACT EXTRACTION W/PHACO Right 01/09/2021   Procedure: CATARACT EXTRACTION PHACO AND INTRAOCULAR LENS PLACEMENT ([facility]);  Surgeon: [name], MD;  Location: [facility];  Service: Ophthalmology;  Laterality: Right;  CDE: 28.65  . PACEMAKER GENERATOR CHANGE N/A 07/27/2014   Procedure: PACEMAKER GENERATOR CHANGE;  Surgeon: Sanda Klein, MD;  Location: [facility];  Service: Cardiovascular;  Laterality: N/A;  . PACEMAKER INSERTION     medtronic adapta- Dr. Sallyanne Kuster follows- LOV 9'14  . TONSILLECTOMY    . TOTAL KNEE ARTHROPLASTY Right 10/05/2013   Procedure: RIGHT TOTAL KNEE ARTHROPLASTY;  Surgeon: Gearlean Alf, MD;  [facility];  Service: Orthopedics;  Laterality: Right;    There were no vitals filed for this visit.               Wound Therapy - 02/03/21 0001    Subjective Pt states that his leg feels the best that it has.    Patient and Family Stated Goals wounds to heal    Prior Treatments neosporin, gauze, antibitics    Pain Scale 0-10    Pain Score  10-Worst pain ever    Pain Type Chronic pain    Pain Location Leg    Pain Orientation Right    Pain Descriptors / Indicators Discomfort    Evaluation and Treatment Procedures Explained to Patient/Family Yes    Evaluation and Treatment Procedures agreed to    Wound Properties Date First Assessed: 01/03/21 Time First Assessed: 1430 Wound Type: Diabetic ulcer Location: Pretibial Location Orientation: Distal;Right Wound Description (Comments): right lower wound Present on Admission: Yes   Wound Image View All Images View Images    Dressing Type Compression wrap;Santyl;Hydrogel    Dressing Changed Changed    Dressing Status Old drainage    Dressing Change Frequency PRN    Site /  Wound Assessment Red;Yellow    % Wound base Red or Granulating 80%    % Wound base Yellow/Fibrinous Exudate 20%    Margins Attached edges (approximated)    Drainage Amount Minimal    Drainage Description Serous    Treatment Cleansed;Debridement (Selective)    Wound Properties Date First Assessed: 01/03/21 Wound Type: Diabetic ulcer Location: Tibial Location Orientation: Distal;Posterior;Right Wound Description (Comments): right posterior wound Present on Admission: Yes   Wound Image View All Images View Images    Dressing Type Hydrogel;Compression wrap    Dressing Changed Changed    Dressing Status Old drainage    Dressing Change Frequency PRN    Site / Wound Assessment Red;Yellow    % Wound base Red or Granulating 95%    % Wound base Yellow/Fibrinous Exudate 5%    Drainage Amount Minimal    Drainage Description Serous    Treatment Cleansed;Debridement (Selective)    Selective Debridement - Location wound bed    Selective Debridement - Tools Used Forceps    Selective Debridement - Tissue Removed slouth    Wound Therapy - Clinical Statement Wound beds continue to increase in granualtion.  Pt less tolerant of debridement with forceps vs scapel    Wound Therapy - Functional Problem List difficulty with dressing    Factors Delaying/Impairing Wound Healing Vascular compromise;Infection - systemic/local;Diabetes Mellitus   recent infection, heart disease   Wound Therapy - Frequency 3X / week    Wound Therapy - Current Recommendations PT    Dressing  santyl to border of lateral wound, hydrogel to center f/b profore lite;  posterior wound hydrogel, 2x2 and profore lite;  Lt profore changed today with no wounds noted on LT LE                     PT Short Term Goals - 01/10/21 1646      PT SHORT TERM GOAL #1   Title Wound on left leg will be completely healed to reduce risk of infection.    Time 4    Period Weeks    Status On-going    Target Date 01/31/21      PT SHORT TERM  GOAL #2   Title Anterior lower wound on right leg will be 100% granulated to deomstrate improved wound healing.    Time 4    Period Weeks    Status On-going    Target Date 01/31/21      PT SHORT TERM GOAL #3   Title Posterior  wound on right leg will be 100% granulated to demonstrate improved wound healing.    Time 4    Period Weeks    Status On-going    Target Date 01/31/21      PT SHORT TERM GOAL #4  Title Patient will report importance of wearing daily compression garments to reduce swelling and risk of forming blisters    Time 4    Period Weeks    Status On-going    Target Date 02/28/21             PT Long Term Goals - 01/10/21 1646      PT LONG TERM GOAL #1   Time 8    Period Weeks    Status New      PT LONG TERM GOAL #2   Title Anterior lower wound on right leg will be completely healed to reduce risk of infection.    Time 8    Period Weeks    Status On-going      PT LONG TERM GOAL #3   Title Posterior wound on right leg will be completely healed to reduce risk of infection.    Time 8    Period Weeks    Status On-going      PT LONG TERM GOAL #4   Title Patietn will report <3/10 pain in legs due to wounds to reduce pain with walking    Status On-going                 Plan - 02/03/21 1533    Clinical Impression Statement see above    Personal Factors and Comorbidities Comorbidity 1;Comorbidity 2;Comorbidity 3+    Comorbidities pacemaker, afib, on anticoagulents, vaicose veins.    Examination-Activity Limitations Locomotion Level;Hygiene/Grooming;Stand    Examination-Participation Restrictions Community Activity    Stability/Clinical Decision Making Stable/Uncomplicated    PT Frequency 3x / week    PT Duration 8 weeks    PT Treatment/Interventions ADLs/Self Care Home Management;Manual techniques;Other (comment)   wound care, modalities   PT Next Visit Plan continue with debridement and dressing changes - measure wounds    PT Home Exercise Plan  ankle pumps    Consulted and Agree with Plan of Care Patient           Patient will benefit from skilled therapeutic intervention in order to improve the following deficits and impairments:  Other (comment),Pain,Increased edema,Decreased skin integrity,Decreased mobility,Decreased activity tolerance (wounds)  Visit Diagnosis: Leg wound, right, initial encounter  Difficulty in walking, not elsewhere classified  Pain in right leg     Problem List Patient Active Problem List   Diagnosis Date Noted  . Cellulitis and abscess of right lower extremity 12/24/2020  . Diabetes mellitus without complication ([place])   . SSS (sick sinus syndrome) ([place]) 07/27/2014  . Pacemaker syndrome 07/27/2014  . Tachy-brady syndrome ([place]) 07/27/2014  . Difficulty in walking(719.7) 11/03/2013  . Stiffness of right knee 11/03/2013  . Postoperative anemia due to acute blood loss 10/06/2013  . OA (osteoarthritis) of knee 10/05/2013  . Preoperative cardiovascular examination 07/20/2013  . Chronic diastolic heart failure ([place]) 04/29/2013  . Long QT interval 04/21/2013  . Volume overload 04/21/2013  . Long term current use of anticoagulant therapy 01/09/2013  . HTN (hypertension) 05/20/2011  . Pleural effusion 05/20/2011  . Lactic acid acidosis 05/18/2011  . Pacemaker 05/18/2011  . Anemia 05/18/2011  . Acute renal failure (ARF) ([place]) 05/18/2011  . Pneumonia 05/17/2011  . Fever 05/17/2011  . Atrial fibrillation ([place]) 05/17/2011  . Hyperglycemia 05/17/2011  . Bradycardia 05/17/2011  . Gout 05/17/2011  . Arthritis 05/17/2011  . Thrombocytopenia ([place]) 05/17/2011    Rayetta Humphrey, PT CLT [phone] 02/03/2021, 3:34 PM  [facility] [address]  [place], [place], [address] Phone: [phone]   Fax:  [phone]  Name: JAYSHON DOMMER MRN: [id number] Date of Birth: 11/12/1933

## 2021-02-06 ENCOUNTER — Other Ambulatory Visit: Payer: Self-pay

## 2021-02-06 ENCOUNTER — Ambulatory Visit (HOSPITAL_COMMUNITY): Payer: Medicare Other | Admitting: Physical Therapy

## 2021-02-06 DIAGNOSIS — M79604 Pain in right leg: Secondary | ICD-10-CM

## 2021-02-06 DIAGNOSIS — S81801A Unspecified open wound, right lower leg, initial encounter: Secondary | ICD-10-CM

## 2021-02-06 DIAGNOSIS — R262 Difficulty in walking, not elsewhere classified: Secondary | ICD-10-CM

## 2021-02-06 NOTE — Therapy (Signed)
[place], [place], [address] Phone: [phone]   Fax:  [phone]  Wound Care Therapy  Patient Details  Name: [name] MRN: [id number] Date of Birth: 16-Sep-1934 Referring Provider (PT): Jeanella Craze, DO   Encounter Date: 02/06/2021   PT End of Session - 02/06/21 1141    Visit Number 14    Number of Visits 29    Date for PT Re-Evaluation 03/14/21    Authorization Type UHC medicare, no auth or VL    Progress Note Due on Visit 20    PT Start Time 1050    PT Stop Time 1135    PT Time Calculation (min) 45 min           Past Medical History:  Diagnosis Date  . Arthritis   . Atrial fibrillation ([place])   . Barrett's esophagus   . CAD (coronary artery disease)    echo 09-05-2010-EF nl, mod-severe dilated Left atrium; myoview 02-08-12-EF 52%, no ischemia  . Cancer [facility])    prostate - radiation only  . Diabetes mellitus without complication ([place])    "prediabetic"-no oral meds  . GERD (gastroesophageal reflux disease)   . Gout   . H/O transesophageal echocardiography (TEE) for monitoring 09/05/10   tee guided AT pace termination, did not proceed with cardioversion due to termination of the atrial flutter through his pacemaker  . History of cardioversion 02/09/13   electrical synchronized cardioversion, on flecainide and warfarin  . History of kidney stones    per pt - no actual diagnosisi  . History of stomach ulcers    many yrs ago  . HTN (hypertension)   . Pacemaker    medtronic  . Pacemaker 09-25-13  . Prostate cancer ([place]) 09-25-13   tx. with radiation only- dx. 2013    Past Surgical History:  Procedure Laterality Date  . APPENDECTOMY    . CARDIAC CATHETERIZATION  02/18/06   EF 65%, focal napkin ring-like lesion into the prox LAD otherwise normal coronaries  . CARDIOVERSION N/A 02/09/2013   Procedure: CARDIOVERSION;  Surgeon: Sanda Klein, MD;  Location: [facility];  Service: Cardiovascular;   Laterality: N/A;  . CATARACT EXTRACTION W/PHACO Left 12/23/2020   Procedure: CATARACT EXTRACTION PHACO AND INTRAOCULAR LENS PLACEMENT ([place]);  Surgeon: [name], MD;  [facility];  Service: Ophthalmology;  Laterality: Left;  CDE 18.55  . CATARACT EXTRACTION W/PHACO Right 01/09/2021   Procedure: CATARACT EXTRACTION PHACO AND INTRAOCULAR LENS PLACEMENT ([facility]);  Surgeon: [name], MD;  [facility];  Service: Ophthalmology;  Laterality: Right;  CDE: 28.65  . PACEMAKER GENERATOR CHANGE N/A 07/27/2014   Procedure: PACEMAKER GENERATOR CHANGE;  Surgeon: Sanda Klein, MD;  Location: [facility]ATH LAB;  Service: Cardiovascular;  Laterality: N/A;  . PACEMAKER INSERTION     medtronic adapta- Dr. Sallyanne Oconnor follows- LOV 9'14  . TONSILLECTOMY    . TOTAL KNEE ARTHROPLASTY Right 10/05/2013   Procedure: RIGHT TOTAL KNEE ARTHROPLASTY;  Surgeon: Gearlean Alf, MD;  [facility];  Service: Orthopedics;  Laterality: Right;    There were no vitals filed for this visit.      [facility] PT Assessment - 02/06/21 0001      Assessment   Medical Diagnosis wounds on right leg    Referring Provider (PT) Pratick Manuella Ghazi, DO                   Wound Therapy - 02/06/21 0001    Subjective States his leg  doesn't hurt as much as it did but still with occasional pain.    Patient and Family Stated Goals wounds to heal    Prior Treatments neosporin, gauze, antibitics    Pain Scale Faces    Faces Pain Scale Hurts little more    Pain Type Chronic pain    Pain Location Leg    Pain Orientation Right    Pain Descriptors / Indicators Discomfort    Pain Intervention(s) Emotional support    Evaluation and Treatment Procedures Explained to Patient/Family Yes    Evaluation and Treatment Procedures agreed to    Wound Properties Date First Assessed: 01/03/21 Time First Assessed: 1430 Wound Type: Diabetic ulcer Location: Pretibial Location Orientation: Distal;Right Wound Description (Comments): right lower wound  Present on Admission: Yes   Wound Image View All Images View Images    Dressing Type Compression wrap;Hydrogel   santyl on gauze   Dressing Changed Changed    Dressing Status Old drainage    Dressing Change Frequency PRN    Site / Wound Assessment Red;Yellow;Granulation tissue    % Wound base Red or Granulating 90%    % Wound base Yellow/Fibrinous Exudate 10%    Peri-wound Assessment Maceration;Edema;Erythema (blanchable)    Wound Length (cm) 5.4 cm   was 5.7   Wound Width (cm) 3.6 cm   was 3.9   Wound Depth (cm) 0.2 cm   .2   Wound Volume (cm^3) 3.89 cm^3    Wound Surface Area (cm^2) 19.44 cm^2    Margins Attached edges (approximated)    Drainage Amount Moderate    Drainage Description Serosanguineous;Sanguineous;Green;No odor    Treatment Cleansed;Debridement (Selective)    Wound Properties Date First Assessed: 01/03/21 Wound Type: Diabetic ulcer Location: Tibial Location Orientation: Distal;Posterior;Right Wound Description (Comments): right posterior wound Present on Admission: Yes   Wound Image View All Images View Images    Dressing Type Hydrogel;Compression wrap    Dressing Changed Changed    Dressing Status Old drainage    Dressing Change Frequency PRN    Site / Wound Assessment Red;Yellow    % Wound base Red or Granulating 95%    % Wound base Yellow/Fibrinous Exudate 5%    Wound Length (cm) 3.6 cm   was 3.6   Wound Width (cm) 3.9 cm   was 4.2   Wound Depth (cm) 0.1 cm   was .2   Wound Volume (cm^3) 1.4 cm^3    Wound Surface Area (cm^2) 14.04 cm^2    Margins Attached edges (approximated)    Drainage Amount Moderate    Drainage Description Serosanguineous;No odor    Treatment Cleansed;Debridement (Selective)    Selective Debridement - Location wound bed    Selective Debridement - Tools Used Forceps;Scalpel    Selective Debridement - Tissue Removed slouth    Wound Therapy - Clinical Statement Blister above posterior wound healing well. One small blister noted along front  of upper shin, covered with Vaseline and gauze and will keep an eye on it. Green drainage from lateral wound but Santyl currently being used here and no signs of infection noted. Maceration noted along both wound, added alginate to help with absorption. Continued with cross hatching adherent slough along wound edges and applying Santyl to wound edges. Hydrogel in center of lateral wound and hydrogel for posterior wound. Continued with profore lite. Will continue with current POC.    Wound Therapy - Functional Problem List difficulty with dressing    Factors Delaying/Impairing Wound Healing Vascular compromise;Infection - systemic/local;Diabetes Mellitus  recent infection, heart disease   Wound Therapy - Frequency 3X / week    Wound Therapy - Current Recommendations PT    Dressing  santyl to border of lateral wound, hydrogel to center f/b alginate and profore lite;  posterior wound hydrogel, 2x2, alginate and profore lite;  Lt profore changed today with no wounds noted on LT LE                     PT Short Term Goals - 01/10/21 1646      PT SHORT TERM GOAL #1   Title Wound on left leg will be completely healed to reduce risk of infection.    Time 4    Period Weeks    Status On-going    Target Date 01/31/21      PT SHORT TERM GOAL #2   Title Anterior lower wound on right leg will be 100% granulated to deomstrate improved wound healing.    Time 4    Period Weeks    Status On-going    Target Date 01/31/21      PT SHORT TERM GOAL #3   Title Posterior  wound on right leg will be 100% granulated to demonstrate improved wound healing.    Time 4    Period Weeks    Status On-going    Target Date 01/31/21      PT SHORT TERM GOAL #4   Title Patient will report importance of wearing daily compression garments to reduce swelling and risk of forming blisters    Time 4    Period Weeks    Status On-going    Target Date 02/28/21             PT Long Term Goals - 01/10/21 1646       PT LONG TERM GOAL #1   Time 8    Period Weeks    Status New      PT LONG TERM GOAL #2   Title Anterior lower wound on right leg will be completely healed to reduce risk of infection.    Time 8    Period Weeks    Status On-going      PT LONG TERM GOAL #3   Title Posterior wound on right leg will be completely healed to reduce risk of infection.    Time 8    Period Weeks    Status On-going      PT LONG TERM GOAL #4   Title Patietn will report <3/10 pain in legs due to wounds to reduce pain with walking    Status On-going                 Plan - 02/06/21 1142    Clinical Impression Statement see above    Personal Factors and Comorbidities Comorbidity 1;Comorbidity 2;Comorbidity 3+    Comorbidities pacemaker, afib, on anticoagulents, vaicose veins.    Examination-Activity Limitations Locomotion Level;Hygiene/Grooming;Stand    Examination-Participation Restrictions Community Activity    Stability/Clinical Decision Making Stable/Uncomplicated    PT Frequency 3x / week    PT Duration 8 weeks    PT Treatment/Interventions ADLs/Self Care Home Management;Manual techniques;Other (comment)   wound care, modalities   PT Next Visit Plan continue with debridement and dressing changes - measure wounds    PT Home Exercise Plan ankle pumps    Consulted and Agree with Plan of Care Patient           Patient will benefit from skilled therapeutic intervention  in order to improve the following deficits and impairments:  Other (comment),Pain,Increased edema,Decreased skin integrity,Decreased mobility,Decreased activity tolerance (wounds)  Visit Diagnosis: Leg wound, right, initial encounter  Difficulty in walking, not elsewhere classified  Pain in right leg     Problem List Patient Active Problem List   Diagnosis Date Noted  . Cellulitis and abscess of right lower extremity 12/24/2020  . Diabetes mellitus without complication ([place])   . SSS (sick sinus syndrome) ([place])  07/27/2014  . Pacemaker syndrome 07/27/2014  . Tachy-brady syndrome ([place]) 07/27/2014  . Difficulty in walking(719.7) 11/03/2013  . Stiffness of right knee 11/03/2013  . Postoperative anemia due to acute blood loss 10/06/2013  . OA (osteoarthritis) of knee 10/05/2013  . Preoperative cardiovascular examination 07/20/2013  . Chronic diastolic heart failure ([place]) 04/29/2013  . Long QT interval 04/21/2013  . Volume overload 04/21/2013  . Long term current use of anticoagulant therapy 01/09/2013  . HTN (hypertension) 05/20/2011  . Pleural effusion 05/20/2011  . Lactic acid acidosis 05/18/2011  . Pacemaker 05/18/2011  . Anemia 05/18/2011  . Acute renal failure (ARF) ([place]) 05/18/2011  . Pneumonia 05/17/2011  . Fever 05/17/2011  . Atrial fibrillation ([place]) 05/17/2011  . Hyperglycemia 05/17/2011  . Bradycardia 05/17/2011  . Gout 05/17/2011  . Arthritis 05/17/2011  . Thrombocytopenia ([place]) 05/17/2011   11:53 AM, 02/06/21 Jerene Pitch, DPT Physical Therapy with [facility]  [phone] office [place] [address] [place], [place], [address] Phone: [phone]   Fax:  [phone]  Name: WLADYSLAW HENRICHS MRN: [id number] Date of Birth: July 24, 1934

## 2021-02-07 ENCOUNTER — Ambulatory Visit (HOSPITAL_COMMUNITY): Payer: Medicare Other

## 2021-02-08 ENCOUNTER — Ambulatory Visit (HOSPITAL_COMMUNITY): Payer: Medicare Other | Admitting: Physical Therapy

## 2021-02-08 ENCOUNTER — Other Ambulatory Visit: Payer: Self-pay

## 2021-02-08 ENCOUNTER — Ambulatory Visit (INDEPENDENT_AMBULATORY_CARE_PROVIDER_SITE_OTHER): Payer: Medicare Other

## 2021-02-08 DIAGNOSIS — R262 Difficulty in walking, not elsewhere classified: Secondary | ICD-10-CM

## 2021-02-08 DIAGNOSIS — S81802A Unspecified open wound, left lower leg, initial encounter: Secondary | ICD-10-CM

## 2021-02-08 DIAGNOSIS — I495 Sick sinus syndrome: Secondary | ICD-10-CM

## 2021-02-08 DIAGNOSIS — S81801A Unspecified open wound, right lower leg, initial encounter: Secondary | ICD-10-CM

## 2021-02-08 DIAGNOSIS — M79604 Pain in right leg: Secondary | ICD-10-CM

## 2021-02-08 NOTE — Therapy (Signed)
[place], [place], [address] Phone: [phone]   Fax:  [phone]  Wound Care Therapy  Patient Details  Name: [name] MRN: [id number] Date of Birth: September 16, 1934 Referring Provider (PT): Jeanella Craze, DO   Encounter Date: 02/08/2021   PT End of Session - 02/08/21 1614    Visit Number 15    Number of Visits 29    Date for PT Re-Evaluation 03/14/21    Authorization Type UHC medicare, no auth or VL    Progress Note Due on Visit 20    PT Start Time 1404    PT Stop Time 1440    PT Time Calculation (min) 36 min           Past Medical History:  Diagnosis Date  . Arthritis   . Atrial fibrillation ([place])   . Barrett's esophagus   . CAD (coronary artery disease)    echo 09-05-2010-EF nl, mod-severe dilated Left atrium; myoview 02-08-12-EF 52%, no ischemia  . Cancer [facility])    prostate - radiation only  . Diabetes mellitus without complication ([place])    "prediabetic"-no oral meds  . GERD (gastroesophageal reflux disease)   . Gout   . H/O transesophageal echocardiography (TEE) for monitoring 09/05/10   tee guided AT pace termination, did not proceed with cardioversion due to termination of the atrial flutter through his pacemaker  . History of cardioversion 02/09/13   electrical synchronized cardioversion, on flecainide and warfarin  . History of kidney stones    per pt - no actual diagnosisi  . History of stomach ulcers    many yrs ago  . HTN (hypertension)   . Pacemaker    medtronic  . Pacemaker 09-25-13  . Prostate cancer ([place]) 09-25-13   tx. with radiation only- dx. 2013    Past Surgical History:  Procedure Laterality Date  . APPENDECTOMY    . CARDIAC CATHETERIZATION  02/18/06   EF 65%, focal napkin ring-like lesion into the prox LAD otherwise normal coronaries  . CARDIOVERSION N/A 02/09/2013   Procedure: CARDIOVERSION;  Surgeon: Sanda Klein, MD;  Location: [place] ENDOSCOPY;  Service: Cardiovascular;   Laterality: N/A;  . CATARACT EXTRACTION W/PHACO Left 12/23/2020   Procedure: CATARACT EXTRACTION PHACO AND INTRAOCULAR LENS PLACEMENT ([place]);  Surgeon: [name], MD;  Location: [facility];  Service: Ophthalmology;  Laterality: Left;  CDE 18.55  . CATARACT EXTRACTION W/PHACO Right 01/09/2021   Procedure: CATARACT EXTRACTION PHACO AND INTRAOCULAR LENS PLACEMENT ([facility]);  Surgeon: [name], MD;  Location: [facility];  Service: Ophthalmology;  Laterality: Right;  CDE: 28.65  . PACEMAKER GENERATOR CHANGE N/A 07/27/2014   Procedure: PACEMAKER GENERATOR CHANGE;  Surgeon: Sanda Klein, MD;  Location: [place] CATH LAB;  Service: Cardiovascular;  Laterality: N/A;  . PACEMAKER INSERTION     medtronic adapta- Dr. Sallyanne Kuster follows- LOV 9'14  . TONSILLECTOMY    . TOTAL KNEE ARTHROPLASTY Right 10/05/2013   Procedure: RIGHT TOTAL KNEE ARTHROPLASTY;  Surgeon: Gearlean Alf, MD;  Location: [facility];  Service: Orthopedics;  Laterality: Right;    There were no vitals filed for this visit.               Wound Therapy - 02/08/21 1602    Subjective pt states his leg is feeling much better.  States he walked at [employer] for an hour yesterday.    Patient and Family Stated Goals wounds to heal    Prior Treatments neosporin, gauze, antibitics    Pain  Scale 0-10    Pain Score 0-No pain    Evaluation and Treatment Procedures Explained to Patient/Family Yes    Evaluation and Treatment Procedures agreed to    Wound Properties Date First Assessed: 01/03/21 Time First Assessed: 1430 Wound Type: Diabetic ulcer Location: Pretibial Location Orientation: Distal;Right Wound Description (Comments): right lower wound Present on Admission: Yes   Dressing Type Compression wrap    Dressing Changed Changed    Dressing Status Old drainage    Dressing Change Frequency PRN    Site / Wound Assessment Red;Yellow   hypergranluation   % Wound base Red or Granulating 90%    % Wound base Yellow/Fibrinous Exudate 10%    Peri-wound  Assessment Maceration    Margins Unattached edges (unapproximated)    Drainage Amount Moderate    Drainage Description Serosanguineous    Treatment Cleansed;Debridement (Selective)    Wound Properties Date First Assessed: 01/03/21 Wound Type: Diabetic ulcer Location: Tibial Location Orientation: Distal;Posterior;Right Wound Description (Comments): right posterior wound Present on Admission: Yes   Dressing Type Hydrogel;Compression wrap    Dressing Changed Changed    Dressing Status Old drainage    Dressing Change Frequency PRN    Site / Wound Assessment Red;Yellow    % Wound base Red or Granulating 95%    % Wound base Yellow/Fibrinous Exudate 5%    Margins Unattached edges (unapproximated)    Drainage Amount Moderate    Drainage Description Serosanguineous    Treatment Cleansed;Debridement (Selective)    Selective Debridement - Location wound bed    Selective Debridement - Tools Used Forceps;Scalpel    Selective Debridement - Tissue Removed slouth    Wound Therapy - Clinical Statement small blisters appear to be healed.  Hypergranulation noted in medial posteiror wound.  Changed dressing on this one to silver hydrofiber to help promote reduced hypergranualtion.  Continued with santyl along border of lateral wound and hydrogel gauze in remaining area.  pt tolerating debridement better and overall improving.  Lt LE to be changed on Fridays.    Wound Therapy - Functional Problem List difficulty with dressing    Factors Delaying/Impairing Wound Healing Vascular compromise;Infection - systemic/local;Diabetes Mellitus   recent infection, heart disease   Wound Therapy - Frequency 3X / week    Wound Therapy - Current Recommendations PT    Wound Plan continue with debridement and appropriate dressing change.  Follow up on reduced hypergranulation of medial posterior wound.  Change LT LE dressing on FRIDAYS (1 week).  Pt to transition to compression stocking when toenails are cut on 4/12.    Dressing   lateral LE:  santly to border, hydrogel gauze, profore lite    Dressing medial posterior  LE:  Silverhydrofiber, gauze, profore lite                     PT Short Term Goals - 01/10/21 1646      PT SHORT TERM GOAL #1   Title Wound on left leg will be completely healed to reduce risk of infection.    Time 4    Period Weeks    Status On-going    Target Date 01/31/21      PT SHORT TERM GOAL #2   Title Anterior lower wound on right leg will be 100% granulated to deomstrate improved wound healing.    Time 4    Period Weeks    Status On-going    Target Date 01/31/21      PT SHORT TERM GOAL #  3   Title Posterior  wound on right leg will be 100% granulated to demonstrate improved wound healing.    Time 4    Period Weeks    Status On-going    Target Date 01/31/21      PT SHORT TERM GOAL #4   Title Patient will report importance of wearing daily compression garments to reduce swelling and risk of forming blisters    Time 4    Period Weeks    Status On-going    Target Date 02/28/21             PT Long Term Goals - 01/10/21 1646      PT LONG TERM GOAL #1   Time 8    Period Weeks    Status New      PT LONG TERM GOAL #2   Title Anterior lower wound on right leg will be completely healed to reduce risk of infection.    Time 8    Period Weeks    Status On-going      PT LONG TERM GOAL #3   Title Posterior wound on right leg will be completely healed to reduce risk of infection.    Time 8    Period Weeks    Status On-going      PT LONG TERM GOAL #4   Title Patietn will report <3/10 pain in legs due to wounds to reduce pain with walking    Status On-going                  Patient will benefit from skilled therapeutic intervention in order to improve the following deficits and impairments:     Visit Diagnosis: Pain in right leg  Leg wound, right, initial encounter  Open leg wound, left, initial encounter  Difficulty in walking, not elsewhere  classified     Problem List Patient Active Problem List   Diagnosis Date Noted  . Cellulitis and abscess of right lower extremity 12/24/2020  . Diabetes mellitus without complication ([place])   . SSS (sick sinus syndrome) ([place]) 07/27/2014  . Pacemaker syndrome 07/27/2014  . Tachy-brady syndrome ([place]) 07/27/2014  . Difficulty in walking(719.7) 11/03/2013  . Stiffness of right knee 11/03/2013  . Postoperative anemia due to acute blood loss 10/06/2013  . OA (osteoarthritis) of knee 10/05/2013  . Preoperative cardiovascular examination 07/20/2013  . Chronic diastolic heart failure ([place]) 04/29/2013  . Long QT interval 04/21/2013  . Volume overload 04/21/2013  . Long term current use of anticoagulant therapy 01/09/2013  . HTN (hypertension) 05/20/2011  . Pleural effusion 05/20/2011  . Lactic acid acidosis 05/18/2011  . Pacemaker 05/18/2011  . Anemia 05/18/2011  . Acute renal failure (ARF) ([place]) 05/18/2011  . Pneumonia 05/17/2011  . Fever 05/17/2011  . Atrial fibrillation ([place]) 05/17/2011  . Hyperglycemia 05/17/2011  . Bradycardia 05/17/2011  . Gout 05/17/2011  . Arthritis 05/17/2011  . Thrombocytopenia ([place]) 05/17/2011   Teena Irani, PTA/CLT [phone]  Teena Irani 02/08/2021, 4:15 PM  [place] [address] [place], [place], [address] Phone: [phone]   Fax:  [phone]  Name: [name] MRN: [id number] Date of Birth: December 17, 1933

## 2021-02-09 ENCOUNTER — Ambulatory Visit (HOSPITAL_COMMUNITY): Payer: Medicare Other | Admitting: Physical Therapy

## 2021-02-09 LAB — CUP PACEART REMOTE DEVICE CHECK
Battery Impedance: 408 Ohm
Battery Remaining Longevity: 112 mo
Battery Voltage: 2.77 V
Brady Statistic RV Percent Paced: 97 %
Date Time Interrogation Session: 20220406122932
Implantable Lead Implant Date: 20070417
Implantable Lead Implant Date: 20070417
Implantable Lead Location: 753859
Implantable Lead Location: 753860
Implantable Lead Model: 5092
Implantable Lead Model: 5594
Implantable Pulse Generator Implant Date: 20150922
Lead Channel Impedance Value: 641 Ohm
Lead Channel Impedance Value: 67 Ohm
Lead Channel Pacing Threshold Amplitude: 0.625 V
Lead Channel Pacing Threshold Pulse Width: 0.4 ms
Lead Channel Setting Pacing Amplitude: 2 V
Lead Channel Setting Pacing Pulse Width: 0.46 ms
Lead Channel Setting Sensing Sensitivity: 5.6 mV

## 2021-02-10 ENCOUNTER — Encounter (HOSPITAL_COMMUNITY): Payer: Self-pay | Admitting: Physical Therapy

## 2021-02-10 ENCOUNTER — Other Ambulatory Visit: Payer: Self-pay

## 2021-02-10 ENCOUNTER — Ambulatory Visit (HOSPITAL_COMMUNITY): Payer: Medicare Other | Admitting: Physical Therapy

## 2021-02-10 DIAGNOSIS — S81801A Unspecified open wound, right lower leg, initial encounter: Secondary | ICD-10-CM | POA: Diagnosis not present

## 2021-02-10 DIAGNOSIS — R262 Difficulty in walking, not elsewhere classified: Secondary | ICD-10-CM

## 2021-02-10 DIAGNOSIS — M79604 Pain in right leg: Secondary | ICD-10-CM

## 2021-02-10 DIAGNOSIS — S81802A Unspecified open wound, left lower leg, initial encounter: Secondary | ICD-10-CM

## 2021-02-10 NOTE — Therapy (Signed)
[place], [place], [address] Phone: [phone]   Fax:  [phone]  Wound Care Therapy  Patient Details  Name: [name] MRN: [id number] Date of Birth: 11-Jul-1934 Referring Provider (PT): Jeanella Craze, DO   Encounter Date: 02/10/2021   PT End of Session - 02/10/21 1542    Visit Number 16    Number of Visits 29    Date for PT Re-Evaluation 03/14/21    Authorization Type UHC medicare, no auth or VL    Progress Note Due on Visit 20    PT Start Time 6283    PT Stop Time 1530    PT Time Calculation (min) 45 min           Past Medical History:  Diagnosis Date  . Arthritis   . Atrial fibrillation ([place])   . Barrett's esophagus   . CAD (coronary artery disease)    echo 09-05-2010-EF nl, mod-severe dilated Left atrium; myoview 02-08-12-EF 52%, no ischemia  . Cancer [facility])    prostate - radiation only  . Diabetes mellitus without complication ([place])    "prediabetic"-no oral meds  . GERD (gastroesophageal reflux disease)   . Gout   . H/O transesophageal echocardiography (TEE) for monitoring 09/05/10   tee guided AT pace termination, did not proceed with cardioversion due to termination of the atrial flutter through his pacemaker  . History of cardioversion 02/09/13   electrical synchronized cardioversion, on flecainide and warfarin  . History of kidney stones    per pt - no actual diagnosisi  . History of stomach ulcers    many yrs ago  . HTN (hypertension)   . Pacemaker    medtronic  . Pacemaker 09-25-13  . Prostate cancer ([place]) 09-25-13   tx. with radiation only- dx. 2013    Past Surgical History:  Procedure Laterality Date  . APPENDECTOMY    . CARDIAC CATHETERIZATION  02/18/06   EF 65%, focal napkin ring-like lesion into the prox LAD otherwise normal coronaries  . CARDIOVERSION N/A 02/09/2013   Procedure: CARDIOVERSION;  Surgeon: Sanda Klein, MD;  Location: [place] ENDOSCOPY;  Service: Cardiovascular;   Laterality: N/A;  . CATARACT EXTRACTION W/PHACO Left 12/23/2020   Procedure: CATARACT EXTRACTION PHACO AND INTRAOCULAR LENS PLACEMENT ([place]);  Surgeon: [name], MD;  [facility];  Service: Ophthalmology;  Laterality: Left;  CDE 18.55  . CATARACT EXTRACTION W/PHACO Right 01/09/2021   Procedure: CATARACT EXTRACTION PHACO AND INTRAOCULAR LENS PLACEMENT ([facility]);  Surgeon: [name], MD;  [facility];  Service: Ophthalmology;  Laterality: Right;  CDE: 28.65  . PACEMAKER GENERATOR CHANGE N/A 07/27/2014   Procedure: PACEMAKER GENERATOR CHANGE;  Surgeon: Sanda Klein, MD;  Location: [place] CATH LAB;  Service: Cardiovascular;  Laterality: N/A;  . PACEMAKER INSERTION     medtronic adapta- Dr. Sallyanne Kuster follows- LOV 9'14  . TONSILLECTOMY    . TOTAL KNEE ARTHROPLASTY Right 10/05/2013   Procedure: RIGHT TOTAL KNEE ARTHROPLASTY;  Surgeon: Gearlean Alf, MD;  [facility];  Service: Orthopedics;  Laterality: Right;    There were no vitals filed for this visit.      [facility] PT Assessment - 02/10/21 0001      Assessment   Medical Diagnosis wounds on right leg    Referring Provider (PT) Pratick Manuella Ghazi, DO                   Wound Therapy - 02/10/21 0001    Subjective pt states his  leg is feeling much better.  States he is walking and standing more.    Patient and Family Stated Goals wounds to heal    Prior Treatments neosporin, gauze, antibitics    Pain Scale 0-10    Pain Score 0-No pain    Evaluation and Treatment Procedures Explained to Patient/Family Yes    Evaluation and Treatment Procedures agreed to    Wound Properties Date First Assessed: 01/03/21 Time First Assessed: 1430 Wound Type: Diabetic ulcer Location: Pretibial Location Orientation: Distal;Right Wound Description (Comments): right lower wound Present on Admission: Yes   Dressing Type Compression wrap    Dressing Changed Changed    Dressing Status Old drainage    Dressing Change Frequency PRN    Site / Wound  Assessment Yellow;Red    % Wound base Red or Granulating 90%    % Wound base Yellow/Fibrinous Exudate 10%    Peri-wound Assessment Erythema (blanchable);Edema    Margins Unattached edges (unapproximated)    Drainage Amount Moderate    Drainage Description Green;Serosanguineous    Treatment Cleansed;Debridement (Selective)    Wound Properties Date First Assessed: 01/03/21 Wound Type: Diabetic ulcer Location: Tibial Location Orientation: Distal;Posterior;Right Wound Description (Comments): right posterior wound Present on Admission: Yes   Dressing Type Silver hydrofiber    Dressing Changed Changed    Dressing Status Old drainage    Dressing Change Frequency PRN    Site / Wound Assessment Red;Yellow    % Wound base Red or Granulating 95%    % Wound base Yellow/Fibrinous Exudate 5%    Peri-wound Assessment Erythema (blanchable);Edema    Margins Attached edges (approximated)    Drainage Amount Moderate    Drainage Description Serosanguineous    Treatment Cleansed;Debridement (Selective)    Selective Debridement - Location wound beds    Selective Debridement - Tools Used Scalpel    Selective Debridement - Tissue Removed slouth    Wound Therapy - Clinical Statement No maceration noted with either wounds and all blisters including one superior to posterior wound healing. Changed profore lite on left leg, anticipate toe nails to be cut next week and transition to compression garments on left leg at that time. Hyper granulation throughout posterior wound, added  inch foam to help with hyper granulation but continued with silver hydro fiber secondary to drainage. Posterior wound with slough initially but this was easily debrided. Continued to use Santyl along lateral border of anterior wound as slough is still adherent, crosshatched surface to adherent slough. Continued with profore lite and number 5 netting. Wounds filling in and increasing in granulation tissue, will measure and take photos next  session.    Wound Therapy - Functional Problem List difficulty with dressing    Factors Delaying/Impairing Wound Healing Vascular compromise;Infection - systemic/local;Diabetes Mellitus   recent infection, heart disease   Wound Therapy - Frequency 3X / week    Wound Therapy - Current Recommendations PT    Wound Plan continue with debridement and appropriate dressing change.  Follow up on reduced hypergranulation of medial posterior wound.  Change LT LE dressing on FRIDAYS (1 week).  Take photos and measure on MONDAYS. Pt to transition to compression stocking when toenails are cut on 4/12.    Dressing  lateral LE:  santly to border, hydrogel gauze, profore lite    Dressing medial posterior  LE:  Silverhydrofiber, gauze, 1/4 inche foam, profore lite                     PT Short  Term Goals - 01/10/21 1646      PT SHORT TERM GOAL #1   Title Wound on left leg will be completely healed to reduce risk of infection.    Time 4    Period Weeks    Status On-going    Target Date 01/31/21      PT SHORT TERM GOAL #2   Title Anterior lower wound on right leg will be 100% granulated to deomstrate improved wound healing.    Time 4    Period Weeks    Status On-going    Target Date 01/31/21      PT SHORT TERM GOAL #3   Title Posterior  wound on right leg will be 100% granulated to demonstrate improved wound healing.    Time 4    Period Weeks    Status On-going    Target Date 01/31/21      PT SHORT TERM GOAL #4   Title Patient will report importance of wearing daily compression garments to reduce swelling and risk of forming blisters    Time 4    Period Weeks    Status On-going    Target Date 02/28/21             PT Long Term Goals - 01/10/21 1646      PT LONG TERM GOAL #1   Time 8    Period Weeks    Status New      PT LONG TERM GOAL #2   Title Anterior lower wound on right leg will be completely healed to reduce risk of infection.    Time 8    Period Weeks    Status  On-going      PT LONG TERM GOAL #3   Title Posterior wound on right leg will be completely healed to reduce risk of infection.    Time 8    Period Weeks    Status On-going      PT LONG TERM GOAL #4   Title Patietn will report <3/10 pain in legs due to wounds to reduce pain with walking    Status On-going                 Plan - 02/10/21 1542    Clinical Impression Statement see above    Personal Factors and Comorbidities Comorbidity 1;Comorbidity 2;Comorbidity 3+    Comorbidities pacemaker, afib, on anticoagulents, vaicose veins.    Examination-Activity Limitations Locomotion Level;Hygiene/Grooming;Stand    Examination-Participation Restrictions Community Activity    Stability/Clinical Decision Making Stable/Uncomplicated    PT Frequency 3x / week    PT Duration 8 weeks    PT Treatment/Interventions ADLs/Self Care Home Management;Manual techniques;Other (comment)   wound care, modalities   PT Next Visit Plan continue with debridement and dressing changes - measure wounds    PT Home Exercise Plan ankle pumps    Consulted and Agree with Plan of Care Patient           Patient will benefit from skilled therapeutic intervention in order to improve the following deficits and impairments:  Other (comment),Pain,Increased edema,Decreased skin integrity,Decreased mobility,Decreased activity tolerance (wounds)  Visit Diagnosis: Pain in right leg  Leg wound, right, initial encounter  Open leg wound, left, initial encounter  Difficulty in walking, not elsewhere classified     Problem List Patient Active Problem List   Diagnosis Date Noted  . Cellulitis and abscess of right lower extremity 12/24/2020  . Diabetes mellitus without complication ([place])   . SSS (sick sinus  syndrome) ([place]) 07/27/2014  . Pacemaker syndrome 07/27/2014  . Tachy-brady syndrome ([place]) 07/27/2014  . Difficulty in walking(719.7) 11/03/2013  . Stiffness of right knee 11/03/2013  . Postoperative anemia  due to acute blood loss 10/06/2013  . OA (osteoarthritis) of knee 10/05/2013  . Preoperative cardiovascular examination 07/20/2013  . Chronic diastolic heart failure ([place]) 04/29/2013  . Long QT interval 04/21/2013  . Volume overload 04/21/2013  . Long term current use of anticoagulant therapy 01/09/2013  . HTN (hypertension) 05/20/2011  . Pleural effusion 05/20/2011  . Lactic acid acidosis 05/18/2011  . Pacemaker 05/18/2011  . Anemia 05/18/2011  . Acute renal failure (ARF) ([place]) 05/18/2011  . Pneumonia 05/17/2011  . Fever 05/17/2011  . Atrial fibrillation ([place]) 05/17/2011  . Hyperglycemia 05/17/2011  . Bradycardia 05/17/2011  . Gout 05/17/2011  . Arthritis 05/17/2011  . Thrombocytopenia ([place]) 05/17/2011    3:51 PM, 02/10/21 Jerene Pitch, DPT Physical Therapy with [facility]  [phone] office  [place] [address] [place], [place], [address] Phone: [phone]   Fax:  [phone]  Name: [name] MRN: [id number] Date of Birth: [birth year]

## 2021-02-13 ENCOUNTER — Other Ambulatory Visit: Payer: Self-pay

## 2021-02-13 ENCOUNTER — Ambulatory Visit (HOSPITAL_COMMUNITY): Payer: Medicare Other | Admitting: Physical Therapy

## 2021-02-13 ENCOUNTER — Encounter (HOSPITAL_COMMUNITY): Payer: Self-pay | Admitting: Physical Therapy

## 2021-02-13 DIAGNOSIS — S81802A Unspecified open wound, left lower leg, initial encounter: Secondary | ICD-10-CM

## 2021-02-13 DIAGNOSIS — M79604 Pain in right leg: Secondary | ICD-10-CM

## 2021-02-13 DIAGNOSIS — R262 Difficulty in walking, not elsewhere classified: Secondary | ICD-10-CM

## 2021-02-13 DIAGNOSIS — S81801A Unspecified open wound, right lower leg, initial encounter: Secondary | ICD-10-CM

## 2021-02-13 NOTE — Therapy (Signed)
[facility] [place], [place], [address] Phone: [phone]   Fax:  [phone]  Wound Care Therapy  Patient Details  Name: [name] MRN: [id number] Date of Birth: 01/11/1934 Referring Provider (PT): Jeanella Craze, DO   Encounter Date: 02/13/2021   PT End of Session - 02/13/21 1454    Visit Number 17    Number of Visits 29    Date for PT Re-Evaluation 03/14/21    Authorization Type UHC medicare, no auth or VL    Progress Note Due on Visit 20    PT Start Time 1315    PT Stop Time 1355    PT Time Calculation (min) 40 min           Past Medical History:  Diagnosis Date  . Arthritis   . Atrial fibrillation ([place])   . Barrett's esophagus   . CAD (coronary artery disease)    echo 09-05-2010-EF nl, mod-severe dilated Left atrium; myoview 02-08-12-EF 52%, no ischemia  . Cancer [facility])    prostate - radiation only  . Diabetes mellitus without complication ([place])    "prediabetic"-no oral meds  . GERD (gastroesophageal reflux disease)   . Gout   . H/O transesophageal echocardiography (TEE) for monitoring 09/05/10   tee guided AT pace termination, did not proceed with cardioversion due to termination of the atrial flutter through his pacemaker  . History of cardioversion 02/09/13   electrical synchronized cardioversion, on flecainide and warfarin  . History of kidney stones    per pt - no actual diagnosisi  . History of stomach ulcers    many yrs ago  . HTN (hypertension)   . Pacemaker    medtronic  . Pacemaker 09-25-13  . Prostate cancer ([place]) 09-25-13   tx. with radiation only- dx. 2013    Past Surgical History:  Procedure Laterality Date  . APPENDECTOMY    . CARDIAC CATHETERIZATION  02/18/06   EF 65%, focal napkin ring-like lesion into the prox LAD otherwise normal coronaries  . CARDIOVERSION N/A 02/09/2013   Procedure: CARDIOVERSION;  Surgeon: Sanda Klein, MD;  Location: [facility];  Service: Cardiovascular;   Laterality: N/A;  . CATARACT EXTRACTION W/PHACO Left 12/23/2020   Procedure: CATARACT EXTRACTION PHACO AND INTRAOCULAR LENS PLACEMENT ([place]);  Surgeon: [name], MD;  Location[facility];  Service: Ophthalmology;  Laterality: Left;  CDE 18.55  . CATARACT EXTRACTION W/PHACO Right 01/09/2021   Procedure: CATARACT EXTRACTION PHACO AND INTRAOCULAR LENS PLACEMENT ([facility]);  Surgeon: [name], MD;  Location: [facility];  Service: Ophthalmology;  Laterality: Right;  CDE: 28.65  . PACEMAKER GENERATOR CHANGE N/A 07/27/2014   Procedure: PACEMAKER GENERATOR CHANGE;  Surgeon: Sanda Klein, MD;  Location: [facility];  Service: Cardiovascular;  Laterality: N/A;  . PACEMAKER INSERTION     medtronic adapta- Dr. Sallyanne Kuster follows- LOV 9'14  . TONSILLECTOMY    . TOTAL KNEE ARTHROPLASTY Right 10/05/2013   Procedure: RIGHT TOTAL KNEE ARTHROPLASTY;  Surgeon: Gearlean Alf, MD;  Location: [facility];  Service: Orthopedics;  Laterality: Right;    There were no vitals filed for this visit.      [facility] PT Assessment - 02/13/21 0001      Assessment   Medical Diagnosis wounds on right leg    Referring Provider (PT) Pratick Manuella Ghazi, DO                   Wound Therapy - 02/13/21 0001    Subjective Pt reports minor  pain in lateral leg day of debridement but otherwise he is feeling much better    Patient and Family Stated Goals wounds to heal    Prior Treatments neosporin, gauze, antibitics    Pain Scale Faces    Faces Pain Scale Hurts even more   with debridement   Pain Type Chronic pain    Pain Location Leg    Pain Orientation Right    Pain Descriptors / Indicators Discomfort    Pain Intervention(s) Emotional support    Evaluation and Treatment Procedures Explained to Patient/Family Yes    Evaluation and Treatment Procedures agreed to    Wound Properties Date First Assessed: 01/03/21 Time First Assessed: 1430 Wound Type: Diabetic ulcer Location: Pretibial Location Orientation: Distal;Right Wound  Description (Comments): right lower wound Present on Admission: Yes   Wound Image View All Images View Images    Dressing Type Compression wrap   santyl on gauze   Dressing Changed Changed    Dressing Status Old drainage    Dressing Change Frequency PRN    Site / Wound Assessment Yellow;Red    % Wound base Red or Granulating 90%    % Wound base Yellow/Fibrinous Exudate 10%    Peri-wound Assessment Erythema (blanchable);Edema    Wound Length (cm) 5.2 cm   was 5.4   Wound Width (cm) 3.5 cm   was 3.6   Wound Depth (cm) 0.2 cm   was .2   Wound Volume (cm^3) 3.64 cm^3    Wound Surface Area (cm^2) 18.2 cm^2    Margins Unattached edges (unapproximated)    Drainage Amount Moderate    Drainage Description Serosanguineous;Green;No odor    Treatment Cleansed;Debridement (Selective)    Wound Properties Date First Assessed: 01/03/21 Wound Type: Diabetic ulcer Location: Tibial Location Orientation: Distal;Posterior;Right Wound Description (Comments): right posterior wound Present on Admission: Yes   Wound Image View All Images View Images    Dressing Type Compression wrap;Silver hydrofiber    Dressing Changed Changed    Dressing Change Frequency PRN    Site / Wound Assessment Red;Yellow;Granulation tissue;Bleeding    % Wound base Red or Granulating 100%    % Wound base Yellow/Fibrinous Exudate 0%    Peri-wound Assessment Erythema (blanchable);Edema;Maceration    Wound Length (cm) 3.5 cm   was 3.6   Wound Width (cm) 3.9 cm   was 3.9   Wound Depth (cm) 0 cm   was .1   Wound Volume (cm^3) 0 cm^3    Wound Surface Area (cm^2) 13.65 cm^2    Margins Attached edges (approximated)    Drainage Amount Moderate    Drainage Description Serosanguineous    Treatment Cleansed;Debridement (Selective)    Selective Debridement - Location wound beds    Selective Debridement - Tools Used Scalpel;Forceps    Selective Debridement - Tissue Removed wound beds, devitalized tissue and dead skin    Wound Therapy -  Clinical Statement Patient's wound continues to improve in granulation. Posterior wound with less hyper granulation with addition of  inch foam. Posterior wound now 100% granulated and healing well, opened edges to improve closure. Anterior wound with continued slough along lateral border, able to debride a bit but patent still not tolerating much debridement. Continued with santyl to adherent slough and xeroform to rest of wound bed. Opened medial wound edges to assist with wound approximation. Continued with profore lite.    Wound Therapy - Functional Problem List difficulty with dressing    Factors Delaying/Impairing Wound Healing Vascular compromise;Infection - systemic/local;Diabetes Mellitus  recent infection, heart disease   Wound Therapy - Frequency 3X / week    Wound Therapy - Current Recommendations PT    Wound Plan continue with debridement and appropriate dressing change.  Follow up on reduced hypergranulation of medial posterior wound.  Change LT LE dressing on FRIDAYS (1 week).  Take photos and measure on MONDAYS. Pt to transition to compression stocking when toenails are cut on 4/12.    Dressing  lateral LE:  santly to lateral border, xeroform, profore lite    Dressing medial posterior  LE:  Silverhydrofiber, gauze, 1/4 inch foam, profore lite                     PT Short Term Goals - 01/10/21 1646      PT SHORT TERM GOAL #1   Title Wound on left leg will be completely healed to reduce risk of infection.    Time 4    Period Weeks    Status On-going    Target Date 01/31/21      PT SHORT TERM GOAL #2   Title Anterior lower wound on right leg will be 100% granulated to deomstrate improved wound healing.    Time 4    Period Weeks    Status On-going    Target Date 01/31/21      PT SHORT TERM GOAL #3   Title Posterior  wound on right leg will be 100% granulated to demonstrate improved wound healing.    Time 4    Period Weeks    Status On-going    Target Date  01/31/21      PT SHORT TERM GOAL #4   Title Patient will report importance of wearing daily compression garments to reduce swelling and risk of forming blisters    Time 4    Period Weeks    Status On-going    Target Date 02/28/21             PT Long Term Goals - 01/10/21 1646      PT LONG TERM GOAL #1   Time 8    Period Weeks    Status New      PT LONG TERM GOAL #2   Title Anterior lower wound on right leg will be completely healed to reduce risk of infection.    Time 8    Period Weeks    Status On-going      PT LONG TERM GOAL #3   Title Posterior wound on right leg will be completely healed to reduce risk of infection.    Time 8    Period Weeks    Status On-going      PT LONG TERM GOAL #4   Title Patietn will report <3/10 pain in legs due to wounds to reduce pain with walking    Status On-going                 Plan - 02/13/21 1454    Clinical Impression Statement see above    Personal Factors and Comorbidities Comorbidity 1;Comorbidity 2;Comorbidity 3+    Comorbidities pacemaker, afib, on anticoagulents, vaicose veins.    Examination-Activity Limitations Locomotion Level;Hygiene/Grooming;Stand    Examination-Participation Restrictions Community Activity    Stability/Clinical Decision Making Stable/Uncomplicated    PT Frequency 3x / week    PT Duration 8 weeks    PT Treatment/Interventions ADLs/Self Care Home Management;Manual techniques;Other (comment)   wound care, modalities   PT Next Visit Plan continue with debridement and dressing  changes - measure wounds    PT Home Exercise Plan ankle pumps    Consulted and Agree with Plan of Care Patient           Patient will benefit from skilled therapeutic intervention in order to improve the following deficits and impairments:  Other (comment),Pain,Increased edema,Decreased skin integrity,Decreased mobility,Decreased activity tolerance (wounds)  Visit Diagnosis: Pain in right leg  Leg wound, right,  initial encounter  Open leg wound, left, initial encounter  Difficulty in walking, not elsewhere classified     Problem List Patient Active Problem List   Diagnosis Date Noted  . Cellulitis and abscess of right lower extremity 12/24/2020  . Diabetes mellitus without complication ([place])   . SSS (sick sinus syndrome) ([place]) 07/27/2014  . Pacemaker syndrome 07/27/2014  . Tachy-brady syndrome ([place]) 07/27/2014  . Difficulty in walking(719.7) 11/03/2013  . Stiffness of right knee 11/03/2013  . Postoperative anemia due to acute blood loss 10/06/2013  . OA (osteoarthritis) of knee 10/05/2013  . Preoperative cardiovascular examination 07/20/2013  . Chronic diastolic heart failure ([place]) 04/29/2013  . Long QT interval 04/21/2013  . Volume overload 04/21/2013  . Long term current use of anticoagulant therapy 01/09/2013  . HTN (hypertension) 05/20/2011  . Pleural effusion 05/20/2011  . Lactic acid acidosis 05/18/2011  . Pacemaker 05/18/2011  . Anemia 05/18/2011  . Acute renal failure (ARF) ([place]) 05/18/2011  . Pneumonia 05/17/2011  . Fever 05/17/2011  . Atrial fibrillation ([place]) 05/17/2011  . Hyperglycemia 05/17/2011  . Bradycardia 05/17/2011  . Gout 05/17/2011  . Arthritis 05/17/2011  . Thrombocytopenia ([place]) 05/17/2011    [name] 02/13/2021, 3:03 PM  [facility] [address] [place], [place], [address] Phone: [phone]   Fax:  [phone]  Name: [name] MRN: [id number] Date of Birth: 1934/06/01

## 2021-02-14 ENCOUNTER — Ambulatory Visit (HOSPITAL_COMMUNITY): Payer: Medicare Other | Admitting: Physical Therapy

## 2021-02-15 ENCOUNTER — Ambulatory Visit (HOSPITAL_COMMUNITY): Payer: Medicare Other | Admitting: Physical Therapy

## 2021-02-15 ENCOUNTER — Ambulatory Visit (INDEPENDENT_AMBULATORY_CARE_PROVIDER_SITE_OTHER): Payer: Medicare Other | Admitting: *Deleted

## 2021-02-15 ENCOUNTER — Other Ambulatory Visit: Payer: Self-pay

## 2021-02-15 DIAGNOSIS — S81802A Unspecified open wound, left lower leg, initial encounter: Secondary | ICD-10-CM

## 2021-02-15 DIAGNOSIS — S81801A Unspecified open wound, right lower leg, initial encounter: Secondary | ICD-10-CM

## 2021-02-15 DIAGNOSIS — Z7901 Long term (current) use of anticoagulants: Secondary | ICD-10-CM

## 2021-02-15 DIAGNOSIS — R262 Difficulty in walking, not elsewhere classified: Secondary | ICD-10-CM

## 2021-02-15 DIAGNOSIS — I4891 Unspecified atrial fibrillation: Secondary | ICD-10-CM

## 2021-02-15 DIAGNOSIS — M79604 Pain in right leg: Secondary | ICD-10-CM

## 2021-02-15 LAB — POCT INR: INR: 1.8 — AB (ref 2.0–3.0)

## 2021-02-15 NOTE — Therapy (Signed)
[facility] [place], [place], [address] Phone: [phone]   Fax:  [phone]  Wound Care Therapy  Patient Details  Name: [name] MRN: [id number] Date of Birth: 1934/03/10 Referring Provider (PT): Jeanella Craze, DO   Encounter Date: 02/15/2021   PT End of Session - 02/15/21 1325    Visit Number 18    Number of Visits 29    Date for PT Re-Evaluation 03/14/21    Authorization Type UHC medicare, no auth or VL    Progress Note Due on Visit 20    PT Start Time 1004    PT Stop Time 1045    PT Time Calculation (min) 41 min           Past Medical History:  Diagnosis Date  . Arthritis   . Atrial fibrillation ([place])   . Barrett's esophagus   . CAD (coronary artery disease)    echo 09-05-2010-EF nl, mod-severe dilated Left atrium; myoview 02-08-12-EF 52%, no ischemia  . Cancer [facility])    prostate - radiation only  . Diabetes mellitus without complication ([place])    "prediabetic"-no oral meds  . GERD (gastroesophageal reflux disease)   . Gout   . H/O transesophageal echocardiography (TEE) for monitoring 09/05/10   tee guided AT pace termination, did not proceed with cardioversion due to termination of the atrial flutter through his pacemaker  . History of cardioversion 02/09/13   electrical synchronized cardioversion, on flecainide and warfarin  . History of kidney stones    per pt - no actual diagnosisi  . History of stomach ulcers    many yrs ago  . HTN (hypertension)   . Pacemaker    medtronic  . Pacemaker 09-25-13  . Prostate cancer ([place]) 09-25-13   tx. with radiation only- dx. 2013    Past Surgical History:  Procedure Laterality Date  . APPENDECTOMY    . CARDIAC CATHETERIZATION  02/18/06   EF 65%, focal napkin ring-like lesion into the prox LAD otherwise normal coronaries  . CARDIOVERSION N/A 02/09/2013   Procedure: CARDIOVERSION;  Surgeon: Sanda Klein, MD;  Location: [facility];  Service: Cardiovascular;   Laterality: N/A;  . CATARACT EXTRACTION W/PHACO Left 12/23/2020   Procedure: CATARACT EXTRACTION PHACO AND INTRAOCULAR LENS PLACEMENT ([place]);  Surgeon: [name], MD;  Location: [facility];  Service: Ophthalmology;  Laterality: Left;  CDE 18.55  . CATARACT EXTRACTION W/PHACO Right 01/09/2021   Procedure: CATARACT EXTRACTION PHACO AND INTRAOCULAR LENS PLACEMENT ([facility]);  Surgeon: [name], MD;  [facility]: [facility];  Service: Ophthalmology;  Laterality: Right;  CDE: 28.65  . PACEMAKER GENERATOR CHANGE N/A 07/27/2014   Procedure: PACEMAKER GENERATOR CHANGE;  Surgeon: Sanda Klein, MD;  Location: [facility] CATH LAB;  Service: Cardiovascular;  Laterality: N/A;  . PACEMAKER INSERTION     medtronic adapta- Dr. Sallyanne Kuster follows- LOV 9'14  . TONSILLECTOMY    . TOTAL KNEE ARTHROPLASTY Right 10/05/2013   Procedure: RIGHT TOTAL KNEE ARTHROPLASTY;  Surgeon: Gearlean Alf, MD;  [facility];  Service: Orthopedics;  Laterality: Right;    There were no vitals filed for this visit.    Subjective Assessment - 02/15/21 1310    Subjective pt states the MD cut and filed his nails.  States it's not hurting like it was.                     Wound Therapy - 02/15/21 1311    Subjective pt states the MD cut and  filed his nails.  States it's not hurting like it was.    Patient and Family Stated Goals wounds to heal    Prior Treatments neosporin, gauze, antibitics    Pain Scale Faces    Faces Pain Scale Hurts a little bit   with debridement   Evaluation and Treatment Procedures Explained to Patient/Family Yes    Evaluation and Treatment Procedures agreed to    Wound Properties Date First Assessed: 01/03/21 Time First Assessed: 1430 Wound Type: Diabetic ulcer Location: Pretibial Location Orientation: Distal;Right Wound Description (Comments): right lower wound Present on Admission: Yes   Dressing Type Compression wrap    Dressing Changed Changed    Dressing Status Old drainage    Dressing Change  Frequency PRN    Site / Wound Assessment Yellow;Red    % Wound base Red or Granulating 90%    % Wound base Yellow/Fibrinous Exudate 10%    Wound Properties Date First Assessed: 01/03/21 Wound Type: Diabetic ulcer Location: Tibial Location Orientation: Distal;Posterior;Right Wound Description (Comments): right posterior wound Present on Admission: Yes   Dressing Type Compression wrap    Dressing Changed Changed    Dressing Status Old drainage    Dressing Change Frequency PRN    Site / Wound Assessment Red;Granulation tissue    % Wound base Red or Granulating 100%    % Wound base Yellow/Fibrinous Exudate 0%    Peri-wound Assessment Erythema (blanchable)    Margins Attached edges (approximated)    Drainage Amount Moderate    Drainage Description Serosanguineous    Treatment Cleansed;Debridement (Selective)    Selective Debridement - Location wound beds    Selective Debridement - Tools Used Scalpel;Forceps    Selective Debridement - Tissue Removed wound beds, devitalized tissue and dead skin    Wound Therapy - Clinical Statement Pt comes today with toenails clipped following podiatrist appt and with compression stocking for Lt LE.  Profore lite removed from both LE's. Wounds are overall improving with debridement needed along edges to further increase approximation and layer of slough removed from lateral wound.  Noted increased hypergranulation of lateral wound as well this session along most medial border and centrally.  Added " foam to this wound in addition to medial one to help reduce this.  Also added calcium alginate without silver over lateral wound as it is also increased in drainage with maceration perimeter this session.  Worked on donning compression stocking on Lt LE and pt is unable to complete this.  Therapist donned stocking and told to keep in place as he returns tomorrow (no appts on Friday).  Informed we would work with the sock butler tomorrow as we had run out of time this  session to attempt this.  Pt is to order a butler and may need to return to profore lite on Lt LE until receives and is independent in donning/doffing stocking.    Wound Therapy - Functional Problem List difficulty with dressing    Factors Delaying/Impairing Wound Healing Vascular compromise;Infection - systemic/local;Diabetes Mellitus   recent infection, heart disease   Wound Therapy - Frequency 3X / week    Wound Therapy - Current Recommendations PT    Wound Plan continue with debridement and appropriate dressing change.  Take photos and measure on MONDAYS. Train with sock butler for donning compression stockings next session.  May want to reapply profore next session until gets his stocking donner    Dressing  lateral LE:  santly to lateral border, xeroform, calcium alginate, 1/2" foam,  profore lite    Dressing medial posterior  LE:  Silverhydrofiber, gauze, 1/2 inch foam, profore lite                     PT Short Term Goals - 01/10/21 1646      PT SHORT TERM GOAL #1   Title Wound on left leg will be completely healed to reduce risk of infection.    Time 4    Period Weeks    Status On-going    Target Date 01/31/21      PT SHORT TERM GOAL #2   Title Anterior lower wound on right leg will be 100% granulated to deomstrate improved wound healing.    Time 4    Period Weeks    Status On-going    Target Date 01/31/21      PT SHORT TERM GOAL #3   Title Posterior  wound on right leg will be 100% granulated to demonstrate improved wound healing.    Time 4    Period Weeks    Status On-going    Target Date 01/31/21      PT SHORT TERM GOAL #4   Title Patient will report importance of wearing daily compression garments to reduce swelling and risk of forming blisters    Time 4    Period Weeks    Status On-going    Target Date 02/28/21             PT Long Term Goals - 01/10/21 1646      PT LONG TERM GOAL #1   Time 8    Period Weeks    Status New      PT LONG TERM  GOAL #2   Title Anterior lower wound on right leg will be completely healed to reduce risk of infection.    Time 8    Period Weeks    Status On-going      PT LONG TERM GOAL #3   Title Posterior wound on right leg will be completely healed to reduce risk of infection.    Time 8    Period Weeks    Status On-going      PT LONG TERM GOAL #4   Title Patietn will report <3/10 pain in legs due to wounds to reduce pain with walking    Status On-going                  Patient will benefit from skilled therapeutic intervention in order to improve the following deficits and impairments:     Visit Diagnosis: No diagnosis found.     Problem List Patient Active Problem List   Diagnosis Date Noted  . Cellulitis and abscess of right lower extremity 12/24/2020  . Diabetes mellitus without complication ([place])   . SSS (sick sinus syndrome) ([place]) 07/27/2014  . Pacemaker syndrome 07/27/2014  . Tachy-brady syndrome ([place]) 07/27/2014  . Difficulty in walking(719.7) 11/03/2013  . Stiffness of right knee 11/03/2013  . Postoperative anemia due to acute blood loss 10/06/2013  . OA (osteoarthritis) of knee 10/05/2013  . Preoperative cardiovascular examination 07/20/2013  . Chronic diastolic heart failure ([place]) 04/29/2013  . Long QT interval 04/21/2013  . Volume overload 04/21/2013  . Long term current use of anticoagulant therapy 01/09/2013  . HTN (hypertension) 05/20/2011  . Pleural effusion 05/20/2011  . Lactic acid acidosis 05/18/2011  . Pacemaker 05/18/2011  . Anemia 05/18/2011  . Acute renal failure (ARF) ([place]) 05/18/2011  . Pneumonia 05/17/2011  .  Fever 05/17/2011  . Atrial fibrillation ([place]) 05/17/2011  . Hyperglycemia 05/17/2011  . Bradycardia 05/17/2011  . Gout 05/17/2011  . Arthritis 05/17/2011  . Thrombocytopenia ([place]) 05/17/2011   Teena Irani, PTA/CLT [phone]  Teena Irani 02/15/2021, 1:25 PM  [facility] [address] [place], [place], [address] Phone: [phone]   Fax:  [phone]  Name: MACARI ZALESKY MRN: [id number] Date of Birth: [birth year]

## 2021-02-15 NOTE — Patient Instructions (Signed)
Take warfarin 1 1/2 tablets tonight then increase dose to 1 tablet daily except 1/2 tablet on Sundays, Tuesdays and Thursdays.   Repeat INR in 3 weeks.

## 2021-02-16 ENCOUNTER — Ambulatory Visit (HOSPITAL_COMMUNITY): Payer: Medicare Other | Admitting: Physical Therapy

## 2021-02-16 DIAGNOSIS — S81801A Unspecified open wound, right lower leg, initial encounter: Secondary | ICD-10-CM

## 2021-02-16 DIAGNOSIS — M79604 Pain in right leg: Secondary | ICD-10-CM

## 2021-02-16 DIAGNOSIS — S81802A Unspecified open wound, left lower leg, initial encounter: Secondary | ICD-10-CM

## 2021-02-16 DIAGNOSIS — R262 Difficulty in walking, not elsewhere classified: Secondary | ICD-10-CM

## 2021-02-16 NOTE — Therapy (Signed)
[facility] [place], [place], [address] Phone: [phone]   Fax:  [phone]  Wound Care Therapy  Patient Details  Name: [name] MRN: [id number] Date of Birth: 1934/07/28 Referring Provider (PT): Jeanella Craze, DO   Encounter Date: 02/16/2021   PT End of Session - 02/16/21 1607    Visit Number 19    Number of Visits 29    Date for PT Re-Evaluation 03/14/21    Authorization Type UHC medicare, no auth or VL    Progress Note Due on Visit 20    PT Start Time 1406    PT Stop Time 1450    PT Time Calculation (min) 44 min           Past Medical History:  Diagnosis Date  . Arthritis   . Atrial fibrillation ([place])   . Barrett's esophagus   . CAD (coronary artery disease)    echo 09-05-2010-EF nl, mod-severe dilated Left atrium; myoview 02-08-12-EF 52%, no ischemia  . Cancer [facility])    prostate - radiation only  . Diabetes mellitus without complication ([place])    "prediabetic"-no oral meds  . GERD (gastroesophageal reflux disease)   . Gout   . H/O transesophageal echocardiography (TEE) for monitoring 09/05/10   tee guided AT pace termination, did not proceed with cardioversion due to termination of the atrial flutter through his pacemaker  . History of cardioversion 02/09/13   electrical synchronized cardioversion, on flecainide and warfarin  . History of kidney stones    per pt - no actual diagnosisi  . History of stomach ulcers    many yrs ago  . HTN (hypertension)   . Pacemaker    medtronic  . Pacemaker 09-25-13  . Prostate cancer ([place]) 09-25-13   tx. with radiation only- dx. 2013    Past Surgical History:  Procedure Laterality Date  . APPENDECTOMY    . CARDIAC CATHETERIZATION  02/18/06   EF 65%, focal napkin ring-like lesion into the prox LAD otherwise normal coronaries  . CARDIOVERSION N/A 02/09/2013   Procedure: CARDIOVERSION;  Surgeon: Sanda Klein, MD;  Location: [facility];  Service: Cardiovascular;   Laterality: N/A;  . CATARACT EXTRACTION W/PHACO Left 12/23/2020   Procedure: CATARACT EXTRACTION PHACO AND INTRAOCULAR LENS PLACEMENT ([place]);  Surgeon: [name], MD;  Location: [facility];  Service: Ophthalmology;  Laterality: Left;  CDE 18.55  . CATARACT EXTRACTION W/PHACO Right 01/09/2021   Procedure: CATARACT EXTRACTION PHACO AND INTRAOCULAR LENS PLACEMENT ([facility]);  Surgeon: [name], MD;  Location: [facility];  Service: Ophthalmology;  Laterality: Right;  CDE: 28.65  . PACEMAKER GENERATOR CHANGE N/A 07/27/2014   Procedure: PACEMAKER GENERATOR CHANGE;  Surgeon: Sanda Klein, MD;  Location: [facility];  Service: Cardiovascular;  Laterality: N/A;  . PACEMAKER INSERTION     medtronic adapta- Dr. Sallyanne Kuster follows- LOV 9'14  . TONSILLECTOMY    . TOTAL KNEE ARTHROPLASTY Right 10/05/2013   Procedure: RIGHT TOTAL KNEE ARTHROPLASTY;  Surgeon: Gearlean Alf, MD;  Location: [facility];  Service: Orthopedics;  Laterality: Right;    There were no vitals filed for this visit.    Subjective Assessment - 02/16/21 1536    Subjective pt states his leg is feeling good with the compression.  STates he has not called yet to get his butler because he had misplaced the pamphlet.  He plans on calling today.    Currently in Pain? No/denies  Wound Therapy - 02/16/21 1537    Subjective Pt reports no issues or pain today.    Patient and Family Stated Goals wounds to heal    Prior Treatments neosporin, gauze, antibitics    Pain Scale 0-10    Pain Score 0-No pain    Evaluation and Treatment Procedures Explained to Patient/Family Yes    Evaluation and Treatment Procedures agreed to    Wound Properties Date First Assessed: 01/03/21 Time First Assessed: 1430 Wound Type: Diabetic ulcer Location: Pretibial Location Orientation: Distal;Right Wound Description (Comments): right lower wound Present on Admission: Yes   Dressing Type Compression wrap    Dressing Changed Changed    Dressing  Status Old drainage    Dressing Change Frequency PRN    Site / Wound Assessment Yellow;Red    % Wound base Red or Granulating 90%    % Wound base Yellow/Fibrinous Exudate 10%    Peri-wound Assessment Erythema (blanchable)    Margins Unattached edges (unapproximated)    Drainage Amount Minimal    Drainage Description Serosanguineous    Treatment Cleansed;Debridement (Selective)    Wound Properties Date First Assessed: 01/03/21 Wound Type: Diabetic ulcer Location: Tibial Location Orientation: Distal;Posterior;Right Wound Description (Comments): right posterior wound Present on Admission: Yes   Dressing Type Compression wrap    Dressing Changed Changed    Dressing Status Old drainage    Dressing Change Frequency PRN    Site / Wound Assessment Red;Granulation tissue    % Wound base Red or Granulating 100%    % Wound base Yellow/Fibrinous Exudate 0%    Peri-wound Assessment Erythema (blanchable)    Margins Attached edges (approximated)    Drainage Amount Minimal    Drainage Description Serosanguineous    Treatment Cleansed;Debridement (Selective)    Selective Debridement - Location wound beds    Selective Debridement - Tools Used Scalpel;Forceps    Selective Debridement - Tissue Removed wound beds, devitalized tissue and dead skin    Wound Therapy - Clinical Statement wounds continue to approximate and granulate.  Addition of foam has encouraged approximation due to reducing hypergranulation.  Able to blade away more slough from posterior border of lateral wound with less santyl needed in this area.  Used mostly xeroform in both wounds today with just a small amount of santyl along posterior border of lateral wound.  Pt able to doff and donn garment on Lt LE today using the gray sock butler.  It did take increased time but pateint was able to complete without any manual assist from therapist.  Resumed profore on Lt LE until pateint receives his butler.  Moisturized LE's well today as they were  both dry and scaly.    Wound Therapy - Functional Problem List difficulty with dressing    Factors Delaying/Impairing Wound Healing Vascular compromise;Infection - systemic/local;Diabetes Mellitus   recent infection, heart disease   Wound Therapy - Frequency 3X / week    Wound Therapy - Current Recommendations PT    Wound Plan continue with debridement and appropriate dressing change.  Take photos and measure on MONDAYS.  Continue with profore until receives butler changing weekly on [facility].    Dressing  lateral LE:  santyl to lateral border, xeroform,calcium alginate, 1/2" foam,  profore lite    Dressing medial posterior  LE: xerform,  Silverhydrofiber, gauze, 1/2 inch foam, profore lite                     PT Short Term Goals - 01/10/21 1646  PT SHORT TERM GOAL #1   Title Wound on left leg will be completely healed to reduce risk of infection.    Time 4    Period Weeks    Status On-going    Target Date 01/31/21      PT SHORT TERM GOAL #2   Title Anterior lower wound on right leg will be 100% granulated to deomstrate improved wound healing.    Time 4    Period Weeks    Status On-going    Target Date 01/31/21      PT SHORT TERM GOAL #3   Title Posterior  wound on right leg will be 100% granulated to demonstrate improved wound healing.    Time 4    Period Weeks    Status On-going    Target Date 01/31/21      PT SHORT TERM GOAL #4   Title Patient will report importance of wearing daily compression garments to reduce swelling and risk of forming blisters    Time 4    Period Weeks    Status On-going    Target Date 02/28/21             PT Long Term Goals - 01/10/21 1646      PT LONG TERM GOAL #1   Time 8    Period Weeks    Status New      PT LONG TERM GOAL #2   Title Anterior lower wound on right leg will be completely healed to reduce risk of infection.    Time 8    Period Weeks    Status On-going      PT LONG TERM GOAL #3   Title Posterior  wound on right leg will be completely healed to reduce risk of infection.    Time 8    Period Weeks    Status On-going      PT LONG TERM GOAL #4   Title Patietn will report <3/10 pain in legs due to wounds to reduce pain with walking    Status On-going                  Patient will benefit from skilled therapeutic intervention in order to improve the following deficits and impairments:     Visit Diagnosis: Leg wound, right, initial encounter  Pain in right leg  Open leg wound, left, initial encounter  Difficulty in walking, not elsewhere classified     Problem List Patient Active Problem List   Diagnosis Date Noted  . Cellulitis and abscess of right lower extremity 12/24/2020  . Diabetes mellitus without complication ([place])   . SSS (sick sinus syndrome) ([place]) 07/27/2014  . Pacemaker syndrome 07/27/2014  . Tachy-brady syndrome ([place]) 07/27/2014  . Difficulty in walking(719.7) 11/03/2013  . Stiffness of right knee 11/03/2013  . Postoperative anemia due to acute blood loss 10/06/2013  . OA (osteoarthritis) of knee 10/05/2013  . Preoperative cardiovascular examination 07/20/2013  . Chronic diastolic heart failure ([place]) 04/29/2013  . Long QT interval 04/21/2013  . Volume overload 04/21/2013  . Long term current use of anticoagulant therapy 01/09/2013  . HTN (hypertension) 05/20/2011  . Pleural effusion 05/20/2011  . Lactic acid acidosis 05/18/2011  . Pacemaker 05/18/2011  . Anemia 05/18/2011  . Acute renal failure (ARF) ([place]) 05/18/2011  . Pneumonia 05/17/2011  . Fever 05/17/2011  . Atrial fibrillation ([place]) 05/17/2011  . Hyperglycemia 05/17/2011  . Bradycardia 05/17/2011  . Gout 05/17/2011  . Arthritis 05/17/2011  . Thrombocytopenia ([place]) 05/17/2011  Teena Irani, PTA/CLT [phone]  Teena Irani 02/16/2021, 4:08 PM  [facility] [place] [address] [place], [place], [address] Phone: [phone]   Fax:   [phone]  Name: [name] MRN: [id number] Date of Birth: May 17, 1934

## 2021-02-20 ENCOUNTER — Encounter (HOSPITAL_COMMUNITY): Payer: Self-pay | Admitting: Physical Therapy

## 2021-02-20 ENCOUNTER — Ambulatory Visit (HOSPITAL_COMMUNITY): Payer: Medicare Other | Admitting: Physical Therapy

## 2021-02-20 ENCOUNTER — Other Ambulatory Visit: Payer: Self-pay

## 2021-02-20 DIAGNOSIS — R262 Difficulty in walking, not elsewhere classified: Secondary | ICD-10-CM

## 2021-02-20 DIAGNOSIS — S81802A Unspecified open wound, left lower leg, initial encounter: Secondary | ICD-10-CM

## 2021-02-20 DIAGNOSIS — S81801A Unspecified open wound, right lower leg, initial encounter: Secondary | ICD-10-CM

## 2021-02-20 DIAGNOSIS — M79604 Pain in right leg: Secondary | ICD-10-CM

## 2021-02-20 NOTE — Progress Notes (Signed)
Remote pacemaker transmission.   

## 2021-02-20 NOTE — Therapy (Addendum)
[facility], [place], [address] Phone: [phone]   Fax:  [phone]  Wound Care Therapy and Progress Note  Patient Details  Name: [name] MRN: [id number] Date of Birth: [birth year] Referring Provider (PT): Jeanella Craze, DO  Progress Note Reporting Period 01/27/21 to 02/20/21  See note below for Objective Data and Assessment of Progress/Goals.       Encounter Date: 02/20/2021   PT End of Session - 02/20/21 1319    Visit Number 20    Number of Visits 29    Date for PT Re-Evaluation 03/14/21    Authorization Type UHC medicare, no auth or VL    Progress Note Due on Visit 30    PT Start Time 1050    PT Stop Time 1129    PT Time Calculation (min) 39 min           Past Medical History:  Diagnosis Date  . Arthritis   . Atrial fibrillation ([place])   . Barrett's esophagus   . CAD (coronary artery disease)    echo 09-05-2010-EF nl, mod-severe dilated Left atrium; myoview 02-08-12-EF 52%, no ischemia  . Cancer [facility])    prostate - radiation only  . Diabetes mellitus without complication ([place])    "prediabetic"-no oral meds  . GERD (gastroesophageal reflux disease)   . Gout   . H/O transesophageal echocardiography (TEE) for monitoring 09/05/10   tee guided AT pace termination, did not proceed with cardioversion due to termination of the atrial flutter through his pacemaker  . History of cardioversion 02/09/13   electrical synchronized cardioversion, on flecainide and warfarin  . History of kidney stones    per pt - no actual diagnosisi  . History of stomach ulcers    many yrs ago  . HTN (hypertension)   . Pacemaker    medtronic  . Pacemaker 09-25-13  . Prostate cancer ([place]) 09-25-13   tx. with radiation only- dx. 2013    Past Surgical History:  Procedure Laterality Date  . APPENDECTOMY    . CARDIAC CATHETERIZATION  02/18/06   EF 65%, focal napkin ring-like lesion into the prox LAD otherwise normal  coronaries  . CARDIOVERSION N/A 02/09/2013   Procedure: CARDIOVERSION;  Surgeon: Sanda Klein, MD;  Location: [facility];  Service: Cardiovascular;  Laterality: N/A;  . CATARACT EXTRACTION W/PHACO Left 12/23/2020   Procedure: CATARACT EXTRACTION PHACO AND INTRAOCULAR LENS PLACEMENT ([place]);  Surgeon: [name], MD;  [facility];  Service: Ophthalmology;  Laterality: Left;  CDE 18.55  . CATARACT EXTRACTION W/PHACO Right 01/09/2021   Procedure: CATARACT EXTRACTION PHACO AND INTRAOCULAR LENS PLACEMENT ([facility]);  Surgeon: [name], MD;  [facility];  Service: Ophthalmology;  Laterality: Right;  CDE: 28.65  . PACEMAKER GENERATOR CHANGE N/A 07/27/2014   Procedure: PACEMAKER GENERATOR CHANGE;  Surgeon: Sanda Klein, MD;  Location: [facility] CATH LAB;  Service: Cardiovascular;  Laterality: N/A;  . PACEMAKER INSERTION     medtronic adapta- Dr. Sallyanne Kuster follows- LOV 9'14  . TONSILLECTOMY    . TOTAL KNEE ARTHROPLASTY Right 10/05/2013   Procedure: RIGHT TOTAL KNEE ARTHROPLASTY;  Surgeon: Gearlean Alf, MD;  [facility];  Service: Orthopedics;  Laterality: Right;    There were no vitals filed for this visit.      [facility] PT Assessment - 02/20/21 0001      Assessment   Medical Diagnosis wounds on right leg    Referring Provider (PT) Pratick Manuella Ghazi, DO  Wound Therapy - 02/20/21 0001    Subjective Reports no pain. Dressings in place.    Patient and Family Stated Goals wounds to heal    Prior Treatments neosporin, gauze, antibitics    Pain Scale 0-10    Pain Score 0-No pain    Evaluation and Treatment Procedures Explained to Patient/Family Yes    Evaluation and Treatment Procedures agreed to    Wound Properties Date First Assessed: 01/03/21 Time First Assessed: 1430 Wound Type: Diabetic ulcer Location: Pretibial Location Orientation: Distal;Right Wound Description (Comments): right lower wound Present on Admission: Yes   Dressing Type Compression wrap    santyl, alginate xeorform   Dressing Changed Changed    Dressing Status Old drainage    Dressing Change Frequency PRN    Site / Wound Assessment Yellow;Red;Bleeding;Granulation tissue    % Wound base Red or Granulating 95%    % Wound base Yellow/Fibrinous Exudate 5%    Peri-wound Assessment Erythema (blanchable);Edema    Wound Length (cm) 4.8 cm   was 5.2   Wound Width (cm) 3.2 cm   was 3.5   Wound Depth (cm) 1 cm   was .2   Wound Volume (cm^3) 15.36 cm^3    Wound Surface Area (cm^2) 15.36 cm^2    Margins Unattached edges (unapproximated)    Drainage Amount Minimal    Drainage Description Serosanguineous    Treatment Debridement (Selective);Cleansed    Wound Properties Date First Assessed: 01/03/21 Wound Type: Diabetic ulcer Location: Tibial Location Orientation: Distal;Posterior;Right Wound Description (Comments): right posterior wound Present on Admission: Yes   Dressing Type Compression wrap    Dressing Changed Changed    Dressing Status Old drainage    Dressing Change Frequency PRN    Site / Wound Assessment Red;Bleeding;Granulation tissue    % Wound base Red or Granulating 100%    % Wound base Yellow/Fibrinous Exudate 0%    Peri-wound Assessment Erythema (blanchable);Edema    Wound Length (cm) 3.2 cm   was 3.5   Wound Width (cm) 3.2 cm   was  3.9   Wound Depth (cm) 0 cm    Wound Volume (cm^3) 0 cm^3    Wound Surface Area (cm^2) 10.24 cm^2    Margins Unattached edges (unapproximated)    Drainage Amount Minimal    Drainage Description Serosanguineous    Treatment Cleansed;Debridement (Selective)    Selective Debridement - Location wound beds    Selective Debridement - Tools Used Scalpel;Forceps    Selective Debridement - Tissue Removed wound beds, devitalized tissue and dead skin    Wound Therapy - Clinical Statement Patient present for progress note. Improved granulation and approximation in both wounds. Continued with Santyl along lateral border of lateral wound and  xeroform and alginate. Xeroform and silver hydro fiber to posterior wound then applied profore lite. Will decrease visits to 2x/week once Santyl no longer needed. Patient has met  short term goals and is working towards long term goals. Will continue with current POC as tolerated.    Wound Therapy - Functional Problem List difficulty with dressing    Factors Delaying/Impairing Wound Healing Vascular compromise;Infection - systemic/local;Diabetes Mellitus   recent infection, heart disease   Wound Therapy - Frequency 3X / week    Wound Therapy - Current Recommendations PT    Wound Plan REDUCE TO 2X/WEEK ONCE SANTYL DRESSING COMPLETED; continue with debridement and appropriate dressing change.  Take photos and measure on MONDAYS.  Continue with profore until receives butler changing weekly on [facility].  Dressing  lateral LE:  santyl to lateral border, xeroform,calcium alginate, 1/2" foam,  profore lite    Dressing medial posterior  LE: xerform,  Silverhydrofiber, gauze, 1/2 inch foam, profore lite                     PT Short Term Goals - 02/20/21 1320      PT SHORT TERM GOAL #1   Title Wound on left leg will be completely healed to reduce risk of infection.    Time 4    Period Weeks    Status Achieved    Target Date 01/31/21      PT SHORT TERM GOAL #2   Title Anterior lower wound on right leg will be 100% granulated to deomstrate improved wound healing.    Time 4    Period Weeks    Status On-going    Target Date 01/31/21      PT SHORT TERM GOAL #3   Title Posterior  wound on right leg will be 100% granulated to demonstrate improved wound healing.    Time 4    Period Weeks    Status Achieved    Target Date 01/31/21      PT SHORT TERM GOAL #4   Title Patient will report importance of wearing daily compression garments to reduce swelling and risk of forming blisters    Time 4    Period Weeks    Status Achieved    Target Date 02/28/21             PT Long Term  Goals - 02/20/21 1321      PT LONG TERM GOAL #1   Time 8    Period Weeks    Status New      PT LONG TERM GOAL #2   Title Anterior lower wound on right leg will be completely healed to reduce risk of infection.    Time 8    Period Weeks    Status On-going      PT LONG TERM GOAL #3   Title Posterior wound on right leg will be completely healed to reduce risk of infection.    Time 8    Period Weeks    Status On-going      PT LONG TERM GOAL #4   Title Patietn will report <3/10 pain in legs due to wounds to reduce pain with walking    Baseline continued pain with debridement    Time 8    Period Weeks    Status On-going                 Plan - 02/20/21 1320    Clinical Impression Statement see above    Personal Factors and Comorbidities Comorbidity 1;Comorbidity 2;Comorbidity 3+    Comorbidities pacemaker, afib, on anticoagulents, vaicose veins.    Examination-Activity Limitations Locomotion Level;Hygiene/Grooming;Stand    Examination-Participation Restrictions Community Activity    Stability/Clinical Decision Making Stable/Uncomplicated    PT Frequency 2x / week    PT Duration 4 weeks    PT Treatment/Interventions ADLs/Self Care Home Management;Manual techniques;Other (comment)   wound care, modalities   PT Next Visit Plan continue with debridement and dressing changes - measure wounds    PT Home Exercise Plan ankle pumps    Consulted and Agree with Plan of Care Patient           Patient will benefit from skilled therapeutic intervention in order to improve the following deficits and impairments:  Other (  comment),Pain,Increased edema,Decreased skin integrity,Decreased mobility,Decreased activity tolerance (wounds)  Visit Diagnosis: Leg wound, right, initial encounter  Pain in right leg  Difficulty in walking, not elsewhere classified  Open leg wound, left, initial encounter     Problem List Patient Active Problem List   Diagnosis Date Noted  .  Cellulitis and abscess of right lower extremity 12/24/2020  . Diabetes mellitus without complication ([place])   . SSS (sick sinus syndrome) ([place]) 07/27/2014  . Pacemaker syndrome 07/27/2014  . Tachy-brady syndrome ([place]) 07/27/2014  . Difficulty in walking(719.7) 11/03/2013  . Stiffness of right knee 11/03/2013  . Postoperative anemia due to acute blood loss 10/06/2013  . OA (osteoarthritis) of knee 10/05/2013  . Preoperative cardiovascular examination 07/20/2013  . Chronic diastolic heart failure ([place]) 04/29/2013  . Long QT interval 04/21/2013  . Volume overload 04/21/2013  . Long term current use of anticoagulant therapy 01/09/2013  . HTN (hypertension) 05/20/2011  . Pleural effusion 05/20/2011  . Lactic acid acidosis 05/18/2011  . Pacemaker 05/18/2011  . Anemia 05/18/2011  . Acute renal failure (ARF) ([place]) 05/18/2011  . Pneumonia 05/17/2011  . Fever 05/17/2011  . Atrial fibrillation ([place]) 05/17/2011  . Hyperglycemia 05/17/2011  . Bradycardia 05/17/2011  . Gout 05/17/2011  . Arthritis 05/17/2011  . Thrombocytopenia ([place]) 05/17/2011    1:44 PM, 02/20/21 Jerene Pitch, DPT Physical Therapy with [facility]  [phone] office  [place] [address] [place], [place], [address] Phone: [phone]   Fax:  [phone]  Name: [name] MRN: [id number] Date of Birth: [birth year]

## 2021-02-22 ENCOUNTER — Ambulatory Visit (HOSPITAL_COMMUNITY): Payer: Medicare Other | Admitting: Physical Therapy

## 2021-02-22 ENCOUNTER — Other Ambulatory Visit: Payer: Self-pay

## 2021-02-22 DIAGNOSIS — S81801A Unspecified open wound, right lower leg, initial encounter: Secondary | ICD-10-CM | POA: Diagnosis not present

## 2021-02-22 DIAGNOSIS — M79604 Pain in right leg: Secondary | ICD-10-CM

## 2021-02-22 NOTE — Therapy (Signed)
[place], [place], [address] Phone: [phone]   Fax:  [phone]  Wound Care Therapy  Patient Details  Name: [name] MRN: [id number] Date of Birth: 10/29/1934 Referring Provider (PT): Jeanella Craze, DO   Encounter Date: 02/22/2021   PT End of Session - 02/22/21 1503    Visit Number 21    Number of Visits 29    Date for PT Re-Evaluation 03/14/21    Authorization Type UHC medicare, no auth or VL    Progress Note Due on Visit 30    PT Start Time 1320    PT Stop Time 1355    PT Time Calculation (min) 35 min           Past Medical History:  Diagnosis Date  . Arthritis   . Atrial fibrillation ([place])   . Barrett's esophagus   . CAD (coronary artery disease)    echo 09-05-2010-EF nl, mod-severe dilated Left atrium; myoview 02-08-12-EF 52%, no ischemia  . Cancer [facility])    prostate - radiation only  . Diabetes mellitus without complication ([place])    "prediabetic"-no oral meds  . GERD (gastroesophageal reflux disease)   . Gout   . H/O transesophageal echocardiography (TEE) for monitoring 09/05/10   tee guided AT pace termination, did not proceed with cardioversion due to termination of the atrial flutter through his pacemaker  . History of cardioversion 02/09/13   electrical synchronized cardioversion, on flecainide and warfarin  . History of kidney stones    per pt - no actual diagnosisi  . History of stomach ulcers    many yrs ago  . HTN (hypertension)   . Pacemaker    medtronic  . Pacemaker 09-25-13  . Prostate cancer ([place]) 09-25-13   tx. with radiation only- dx. 2013    Past Surgical History:  Procedure Laterality Date  . APPENDECTOMY    . CARDIAC CATHETERIZATION  02/18/06   EF 65%, focal napkin ring-like lesion into the prox LAD otherwise normal coronaries  . CARDIOVERSION N/A 02/09/2013   Procedure: CARDIOVERSION;  Surgeon: Sanda Klein, MD;  Location: [facility];  Service: Cardiovascular;   Laterality: N/A;  . CATARACT EXTRACTION W/PHACO Left 12/23/2020   Procedure: CATARACT EXTRACTION PHACO AND INTRAOCULAR LENS PLACEMENT ([place]);  Surgeon: [name], MD;  Location: [facility];  Service: Ophthalmology;  Laterality: Left;  CDE 18.55  . CATARACT EXTRACTION W/PHACO Right 01/09/2021   Procedure: CATARACT EXTRACTION PHACO AND INTRAOCULAR LENS PLACEMENT ([facility]);  Surgeon: [name], MD;  Location: [facility];  Service: Ophthalmology;  Laterality: Right;  CDE: 28.65  . PACEMAKER GENERATOR CHANGE N/A 07/27/2014   Procedure: PACEMAKER GENERATOR CHANGE;  Surgeon: Sanda Klein, MD;  Location: [facility];  Service: Cardiovascular;  Laterality: N/A;  . PACEMAKER INSERTION     medtronic adapta- Dr. Sallyanne Kuster follows- LOV 9'14  . TONSILLECTOMY    . TOTAL KNEE ARTHROPLASTY Right 10/05/2013   Procedure: RIGHT TOTAL KNEE ARTHROPLASTY;  Surgeon: Gearlean Alf, MD;  Location: [facility];  Service: Orthopedics;  Laterality: Right;    There were no vitals filed for this visit.    Subjective Assessment - 02/22/21 1411    Subjective Pt states he is feeling good today.    Currently in Pain? No/denies                     Wound Therapy - 02/22/21 1412    Subjective Pt without any issues today.  STates he got  his butler ordered and should have it by next visit.    Patient and Family Stated Goals wounds to heal    Prior Treatments neosporin, gauze, antibitics    Pain Scale 0-10    Pain Score 0-No pain    Evaluation and Treatment Procedures Explained to Patient/Family Yes    Evaluation and Treatment Procedures agreed to    Wound Properties Date First Assessed: 01/03/21 Time First Assessed: 1430 Wound Type: Diabetic ulcer Location: Pretibial Location Orientation: Distal;Right Wound Description (Comments): right lower wound Present on Admission: Yes   Dressing Type Compression wrap    Dressing Changed Changed    Dressing Status Old drainage    Dressing Change Frequency PRN    Site / Wound  Assessment Pink;Red    % Wound base Red or Granulating --   98%   % Wound base Yellow/Fibrinous Exudate --   2%   Margins Attached edges (approximated)    Drainage Amount Minimal    Drainage Description Serosanguineous    Treatment Cleansed;Debridement (Selective)    Wound Properties Date First Assessed: 01/03/21 Wound Type: Diabetic ulcer Location: Tibial Location Orientation: Distal;Posterior;Right Wound Description (Comments): right posterior wound Present on Admission: Yes   Dressing Type Compression wrap    Dressing Changed Changed    Dressing Status Old drainage    Dressing Change Frequency PRN    % Wound base Red or Granulating 100%    % Wound base Yellow/Fibrinous Exudate 0%    Peri-wound Assessment Erythema (blanchable)    Margins Attached edges (approximated)    Drainage Amount Minimal    Drainage Description Serosanguineous    Treatment Cleansed;Debridement (Selective)    Selective Debridement - Location wound beds    Selective Debridement - Tools Used Scalpel;Forceps    Selective Debridement - Tissue Removed wound beds, devitalized tissue and dead skin    Wound Therapy - Clinical Statement Wounds continue to granulate, approximate and improve overall.  There was increased drainage of both wounds this session that went through to foam.  Foam replaced this session and reduced to " thickness.  Alginate used over both dressings to help reduce maceration perimeter of wounds.  Discontinued use of santyl on lateral wound so now only using xeroform on both.    Wound Therapy - Functional Problem List difficulty with dressing    Factors Delaying/Impairing Wound Healing Vascular compromise;Infection - systemic/local;Diabetes Mellitus   recent infection, heart disease   Wound Therapy - Frequency 3X / week    Wound Therapy - Current Recommendations PT    Wound Plan REDUCE TO 2X/WEEK ONCE SANTYL DRESSING COMPLETED; continue with debridement and appropriate dressing change.  Take photos and  measure on MONDAYS.  Continue with profore until receives butler changing weekly on [facility].    Dressing  lateral BB:CWUGQBVQ,XIHWTUU alginate, 1/2" foam,  profore lite    Dressing medial posterior  LE: xerform,  Silverhydrofiber, gauze, 1/2 inch foam, profore lite                     PT Short Term Goals - 02/20/21 1320      PT SHORT TERM GOAL #1   Title Wound on left leg will be completely healed to reduce risk of infection.    Time 4    Period Weeks    Status Achieved    Target Date 01/31/21      PT SHORT TERM GOAL #2   Title Anterior lower wound on right leg will be 100% granulated to deomstrate improved  wound healing.    Time 4    Period Weeks    Status On-going    Target Date 01/31/21      PT SHORT TERM GOAL #3   Title Posterior  wound on right leg will be 100% granulated to demonstrate improved wound healing.    Time 4    Period Weeks    Status Achieved    Target Date 01/31/21      PT SHORT TERM GOAL #4   Title Patient will report importance of wearing daily compression garments to reduce swelling and risk of forming blisters    Time 4    Period Weeks    Status Achieved    Target Date 02/28/21             PT Long Term Goals - 02/20/21 1321      PT LONG TERM GOAL #1   Time 8    Period Weeks    Status New      PT LONG TERM GOAL #2   Title Anterior lower wound on right leg will be completely healed to reduce risk of infection.    Time 8    Period Weeks    Status On-going      PT LONG TERM GOAL #3   Title Posterior wound on right leg will be completely healed to reduce risk of infection.    Time 8    Period Weeks    Status On-going      PT LONG TERM GOAL #4   Title Patietn will report <3/10 pain in legs due to wounds to reduce pain with walking    Baseline continued pain with debridement    Time 8    Period Weeks    Status On-going                  Patient will benefit from skilled therapeutic intervention in order to  improve the following deficits and impairments:     Visit Diagnosis: Pain in right leg  Leg wound, right, initial encounter     Problem List Patient Active Problem List   Diagnosis Date Noted  . Cellulitis and abscess of right lower extremity 12/24/2020  . Diabetes mellitus without complication ([place])   . SSS (sick sinus syndrome) ([place]) 07/27/2014  . Pacemaker syndrome 07/27/2014  . Tachy-brady syndrome ([place]) 07/27/2014  . Difficulty in walking(719.7) 11/03/2013  . Stiffness of right knee 11/03/2013  . Postoperative anemia due to acute blood loss 10/06/2013  . OA (osteoarthritis) of knee 10/05/2013  . Preoperative cardiovascular examination 07/20/2013  . Chronic diastolic heart failure ([place]) 04/29/2013  . Long QT interval 04/21/2013  . Volume overload 04/21/2013  . Long term current use of anticoagulant therapy 01/09/2013  . HTN (hypertension) 05/20/2011  . Pleural effusion 05/20/2011  . Lactic acid acidosis 05/18/2011  . Pacemaker 05/18/2011  . Anemia 05/18/2011  . Acute renal failure (ARF) ([place]) 05/18/2011  . Pneumonia 05/17/2011  . Fever 05/17/2011  . Atrial fibrillation ([place]) 05/17/2011  . Hyperglycemia 05/17/2011  . Bradycardia 05/17/2011  . Gout 05/17/2011  . Arthritis 05/17/2011  . Thrombocytopenia ([place]) 05/17/2011   Teena Irani, PTA/CLT [phone]  Teena Irani 02/22/2021, 3:04 PM  [place], [place], [address] Phone: [phone]   Fax:  [phone]  Name: BODHI MORADI MRN: [id number] Date of Birth: Feb 23, 1934

## 2021-02-24 ENCOUNTER — Encounter (HOSPITAL_COMMUNITY): Payer: Self-pay | Admitting: Physical Therapy

## 2021-02-24 ENCOUNTER — Other Ambulatory Visit: Payer: Self-pay

## 2021-02-24 ENCOUNTER — Ambulatory Visit (HOSPITAL_COMMUNITY): Payer: Medicare Other | Admitting: Physical Therapy

## 2021-02-24 DIAGNOSIS — M79604 Pain in right leg: Secondary | ICD-10-CM

## 2021-02-24 DIAGNOSIS — S81802A Unspecified open wound, left lower leg, initial encounter: Secondary | ICD-10-CM

## 2021-02-24 DIAGNOSIS — S81801A Unspecified open wound, right lower leg, initial encounter: Secondary | ICD-10-CM

## 2021-02-24 DIAGNOSIS — R262 Difficulty in walking, not elsewhere classified: Secondary | ICD-10-CM

## 2021-02-24 NOTE — Therapy (Signed)
[place], [place], [address] Phone: [phone]   Fax:  [phone]  Wound Care Therapy  Patient Details  Name: [name] MRN: [id number] Date of Birth: 1934-07-23 Referring Provider (PT): Jeanella Craze, DO   Encounter Date: 02/24/2021   PT End of Session - 02/24/21 1233    Visit Number 22    Number of Visits 29    Date for PT Re-Evaluation 03/14/21    Authorization Type UHC medicare, no auth or VL    Progress Note Due on Visit 30    PT Start Time 1141    PT Stop Time 1208    PT Time Calculation (min) 27 min           Past Medical History:  Diagnosis Date  . Arthritis   . Atrial fibrillation ([place])   . Barrett's esophagus   . CAD (coronary artery disease)    echo 09-05-2010-EF nl, mod-severe dilated Left atrium; myoview 02-08-12-EF 52%, no ischemia  . Cancer [facility])    prostate - radiation only  . Diabetes mellitus without complication ([place])    "prediabetic"-no oral meds  . GERD (gastroesophageal reflux disease)   . Gout   . H/O transesophageal echocardiography (TEE) for monitoring 09/05/10   tee guided AT pace termination, did not proceed with cardioversion due to termination of the atrial flutter through his pacemaker  . History of cardioversion 02/09/13   electrical synchronized cardioversion, on flecainide and warfarin  . History of kidney stones    per pt - no actual diagnosisi  . History of stomach ulcers    many yrs ago  . HTN (hypertension)   . Pacemaker    medtronic  . Pacemaker 09-25-13  . Prostate cancer ([place]) 09-25-13   tx. with radiation only- dx. 2013    Past Surgical History:  Procedure Laterality Date  . APPENDECTOMY    . CARDIAC CATHETERIZATION  02/18/06   EF 65%, focal napkin ring-like lesion into the prox LAD otherwise normal coronaries  . CARDIOVERSION N/A 02/09/2013   Procedure: CARDIOVERSION;  Surgeon: Sanda Klein, MD;  Location: [facility];  Service: Cardiovascular;   Laterality: N/A;  . CATARACT EXTRACTION W/PHACO Left 12/23/2020   Procedure: CATARACT EXTRACTION PHACO AND INTRAOCULAR LENS PLACEMENT ([place]);  Surgeon: [name], MD;  Location: [facility];  Service: Ophthalmology;  Laterality: Left;  CDE 18.55  . CATARACT EXTRACTION W/PHACO Right 01/09/2021   Procedure: CATARACT EXTRACTION PHACO AND INTRAOCULAR LENS PLACEMENT ([facility]);  Surgeon: [name], MD;  Location: [facility];  Service: Ophthalmology;  Laterality: Right;  CDE: 28.65  . PACEMAKER GENERATOR CHANGE N/A 07/27/2014   Procedure: PACEMAKER GENERATOR CHANGE;  Surgeon: Sanda Klein, MD;  Location: [facility];  Service: Cardiovascular;  Laterality: N/A;  . PACEMAKER INSERTION     medtronic adapta- Dr. Sallyanne Kuster follows- LOV 9'14  . TONSILLECTOMY    . TOTAL KNEE ARTHROPLASTY Right 10/05/2013   Procedure: RIGHT TOTAL KNEE ARTHROPLASTY;  Surgeon: Gearlean Alf, MD;  [facility];  Service: Orthopedics;  Laterality: Right;    There were no vitals filed for this visit.      [facility] PT Assessment - 02/24/21 0001      Assessment   Medical Diagnosis wounds on right leg    Referring Provider (PT) Pratick Manuella Ghazi, DO                   Wound Therapy - 02/24/21 0001    Subjective No pain noted  states butler should be coming today    Patient and Family Stated Goals wounds to heal    Prior Treatments neosporin, gauze, antibitics    Pain Scale 0-10    Pain Score 0-No pain    Evaluation and Treatment Procedures Explained to Patient/Family Yes    Evaluation and Treatment Procedures agreed to    Wound Properties Date First Assessed: 01/03/21 Time First Assessed: 1430 Wound Type: Diabetic ulcer Location: Pretibial Location Orientation: Distal;Right Wound Description (Comments): right lower wound Present on Admission: Yes   Dressing Type Gauze (Comment);Impregnated gauze (bismuth);Silver hydrofiber    Dressing Changed Changed    Dressing Status Old drainage    Dressing Change Frequency PRN     Site / Wound Assessment Pink;Bleeding;Granulation tissue    % Wound base Red or Granulating --   97   % Wound base Yellow/Fibrinous Exudate --   3%   Peri-wound Assessment Erythema (blanchable);Edema    Margins Attached edges (approximated)    Drainage Amount Minimal    Drainage Description Serosanguineous    Treatment Cleansed;Debridement (Selective)    Wound Properties Date First Assessed: 01/03/21 Wound Type: Diabetic ulcer Location: Tibial Location Orientation: Distal;Posterior;Right Wound Description (Comments): right posterior wound Present on Admission: Yes   Dressing Type Compression wrap;Silver hydrofiber;Impregnated gauze (bismuth)    Dressing Changed Changed    Dressing Status Old drainage    Dressing Change Frequency PRN    % Wound base Red or Granulating 100%    % Wound base Yellow/Fibrinous Exudate 0%    Peri-wound Assessment Erythema (blanchable);Edema    Margins Attached edges (approximated)    Drainage Amount Minimal    Drainage Description Serosanguineous    Treatment Cleansed;Debridement (Selective)    Selective Debridement - Location wound beds    Selective Debridement - Tools Used Scalpel;Forceps    Selective Debridement - Tissue Removed wound beds, devitalized tissue and dead skin    Wound Therapy - Clinical Statement Wound conitnues to improve in granulation tissue. Reduced apteitn to 2x/week secondary to no longer using Santyl. Continued with debridment and lateral wound still with one small spot of adherent slough, crosshatched this area. Continued wtih xerofrom and alginate,  coveringwith 1/2 inch foam and profore lite. Will continue with current POC as tolerated.    Wound Therapy - Functional Problem List difficulty with dressing    Factors Delaying/Impairing Wound Healing Vascular compromise;Infection - systemic/local;Diabetes Mellitus   recent infection, heart disease   Wound Therapy - Frequency 3X / week    Wound Therapy - Current Recommendations PT    Wound  Plan continue with debridement and appropriate dressing change.  Take photos and measure on MONDAYS.  Continue with profore until receives butler changing weekly on THURSDAYS.    Dressing  lateral MV:HQIONGEX,BMWUXLK alginate, 1/2" foam,  profore lite    Dressing medial posterior  LE: xerform,  alginate, gauze, 1/2 inch foam, profore lite                     PT Short Term Goals - 02/20/21 1320      PT SHORT TERM GOAL #1   Title Wound on left leg will be completely healed to reduce risk of infection.    Time 4    Period Weeks    Status Achieved    Target Date 01/31/21      PT SHORT TERM GOAL #2   Title Anterior lower wound on right leg will be 100% granulated to deomstrate improved wound healing.  Time 4    Period Weeks    Status On-going    Target Date 01/31/21      PT SHORT TERM GOAL #3   Title Posterior  wound on right leg will be 100% granulated to demonstrate improved wound healing.    Time 4    Period Weeks    Status Achieved    Target Date 01/31/21      PT SHORT TERM GOAL #4   Title Patient will report importance of wearing daily compression garments to reduce swelling and risk of forming blisters    Time 4    Period Weeks    Status Achieved    Target Date 02/28/21             PT Long Term Goals - 02/20/21 1321      PT LONG TERM GOAL #1   Time 8    Period Weeks    Status New      PT LONG TERM GOAL #2   Title Anterior lower wound on right leg will be completely healed to reduce risk of infection.    Time 8    Period Weeks    Status On-going      PT LONG TERM GOAL #3   Title Posterior wound on right leg will be completely healed to reduce risk of infection.    Time 8    Period Weeks    Status On-going      PT LONG TERM GOAL #4   Title Patietn will report <3/10 pain in legs due to wounds to reduce pain with walking    Baseline continued pain with debridement    Time 8    Period Weeks    Status On-going                   Patient will benefit from skilled therapeutic intervention in order to improve the following deficits and impairments:     Visit Diagnosis: Leg wound, right, initial encounter  Pain in right leg  Difficulty in walking, not elsewhere classified  Open leg wound, left, initial encounter     Problem List Patient Active Problem List   Diagnosis Date Noted  . Cellulitis and abscess of right lower extremity 12/24/2020  . Diabetes mellitus without complication ([place])   . SSS (sick sinus syndrome) ([place]) 07/27/2014  . Pacemaker syndrome 07/27/2014  . Tachy-brady syndrome ([place]) 07/27/2014  . Difficulty in walking(719.7) 11/03/2013  . Stiffness of right knee 11/03/2013  . Postoperative anemia due to acute blood loss 10/06/2013  . OA (osteoarthritis) of knee 10/05/2013  . Preoperative cardiovascular examination 07/20/2013  . Chronic diastolic heart failure ([place]) 04/29/2013  . Long QT interval 04/21/2013  . Volume overload 04/21/2013  . Long term current use of anticoagulant therapy 01/09/2013  . HTN (hypertension) 05/20/2011  . Pleural effusion 05/20/2011  . Lactic acid acidosis 05/18/2011  . Pacemaker 05/18/2011  . Anemia 05/18/2011  . Acute renal failure (ARF) ([place]) 05/18/2011  . Pneumonia 05/17/2011  . Fever 05/17/2011  . Atrial fibrillation ([place]) 05/17/2011  . Hyperglycemia 05/17/2011  . Bradycardia 05/17/2011  . Gout 05/17/2011  . Arthritis 05/17/2011  . Thrombocytopenia ([place]) 05/17/2011    12:34 PM, 02/24/21 Jerene Pitch, DPT Physical Therapy with [facility]  [phone] office  [place] [address] [place], [place], [address] Phone: [phone]   Fax:  [phone]  Name: MARKCUS TWIDDY MRN: [id number] Date of Birth: Mar 07, 1934

## 2021-02-27 ENCOUNTER — Other Ambulatory Visit: Payer: Self-pay

## 2021-02-27 ENCOUNTER — Encounter (HOSPITAL_COMMUNITY): Payer: Self-pay | Admitting: Physical Therapy

## 2021-02-27 ENCOUNTER — Ambulatory Visit (HOSPITAL_COMMUNITY): Payer: Medicare Other | Admitting: Physical Therapy

## 2021-02-27 DIAGNOSIS — M79604 Pain in right leg: Secondary | ICD-10-CM

## 2021-02-27 DIAGNOSIS — S81801A Unspecified open wound, right lower leg, initial encounter: Secondary | ICD-10-CM

## 2021-02-27 DIAGNOSIS — R262 Difficulty in walking, not elsewhere classified: Secondary | ICD-10-CM

## 2021-02-27 NOTE — Therapy (Signed)
[place], [place], [address] Phone: [phone]   Fax:  [phone]  Wound Care Therapy  Patient Details  Name: [name] MRN: [id number] Date of Birth: 1934-05-02 Referring Provider (PT): Jeanella Craze, DO   Encounter Date: 02/27/2021   PT End of Session - 02/27/21 1209    Visit Number 23    Number of Visits 29    Date for PT Re-Evaluation 03/14/21    Authorization Type UHC medicare, no auth or VL    Progress Note Due on Visit 30    PT Start Time 1130    PT Stop Time 1207    PT Time Calculation (min) 37 min           Past Medical History:  Diagnosis Date  . Arthritis   . Atrial fibrillation ([place])   . Barrett's esophagus   . CAD (coronary artery disease)    echo 09-05-2010-EF nl, mod-severe dilated Left atrium; myoview 02-08-12-EF 52%, no ischemia  . Cancer [facility])    prostate - radiation only  . Diabetes mellitus without complication ([place])    "prediabetic"-no oral meds  . GERD (gastroesophageal reflux disease)   . Gout   . H/O transesophageal echocardiography (TEE) for monitoring 09/05/10   tee guided AT pace termination, did not proceed with cardioversion due to termination of the atrial flutter through his pacemaker  . History of cardioversion 02/09/13   electrical synchronized cardioversion, on flecainide and warfarin  . History of kidney stones    per pt - no actual diagnosisi  . History of stomach ulcers    many yrs ago  . HTN (hypertension)   . Pacemaker    medtronic  . Pacemaker 09-25-13  . Prostate cancer ([place]) 09-25-13   tx. with radiation only- dx. 2013    Past Surgical History:  Procedure Laterality Date  . APPENDECTOMY    . CARDIAC CATHETERIZATION  02/18/06   EF 65%, focal napkin ring-like lesion into the prox LAD otherwise normal coronaries  . CARDIOVERSION N/A 02/09/2013   Procedure: CARDIOVERSION;  Surgeon: Sanda Klein, MD;  Location: [place] [facility];  Service: Cardiovascular;   Laterality: N/A;  . CATARACT EXTRACTION W/PHACO Left 12/23/2020   Procedure: CATARACT EXTRACTION PHACO AND INTRAOCULAR LENS PLACEMENT ([place]);  Surgeon: [name], MD;  Location[facility];  Service: Ophthalmology;  Laterality: Left;  CDE 18.55  . CATARACT EXTRACTION W/PHACO Right 01/09/2021   Procedure: CATARACT EXTRACTION PHACO AND INTRAOCULAR LENS PLACEMENT ([facility]);  Surgeon: [name], MD;  Location: [facility];  Service: Ophthalmology;  Laterality: Right;  CDE: 28.65  . PACEMAKER GENERATOR CHANGE N/A 07/27/2014   Procedure: PACEMAKER GENERATOR CHANGE;  Surgeon: Sanda Klein, MD;  Location: [place] CATH LAB;  Service: Cardiovascular;  Laterality: N/A;  . PACEMAKER INSERTION     medtronic adapta- Dr. Sallyanne Kuster follows- LOV 9'14  . TONSILLECTOMY    . TOTAL KNEE ARTHROPLASTY Right 10/05/2013   Procedure: RIGHT TOTAL KNEE ARTHROPLASTY;  Surgeon: Gearlean Alf, MD;  [facility];  Service: Orthopedics;  Laterality: Right;    There were no vitals filed for this visit.      [facility] PT Assessment - 02/27/21 0001      Assessment   Medical Diagnosis wounds on right leg    Referring Provider (PT) Pratick Manuella Ghazi, DO                   Wound Therapy - 02/27/21 0001    Subjective Got butler on  Friday. States he took profore off the left leg, washed it, lotioned and then put his compression sock on with ease.    Patient and Family Stated Goals wounds to heal    Prior Treatments neosporin, gauze, antibitics    Pain Scale 0-10    Pain Score 0-No pain    Evaluation and Treatment Procedures Explained to Patient/Family Yes    Evaluation and Treatment Procedures agreed to    Wound Properties Date First Assessed: 01/03/21 Time First Assessed: 1430 Wound Type: Diabetic ulcer Location: Pretibial Location Orientation: Distal;Right Wound Description (Comments): right lower wound Present on Admission: Yes   Dressing Type Gauze (Comment);Compression wrap;Impregnated gauze (bismuth);Alginate    Dressing  Changed Changed    Dressing Status Old drainage    Dressing Change Frequency PRN    Site / Wound Assessment Pink;Red;Granulation tissue;Bleeding    % Wound base Red or Granulating 95%    % Wound base Yellow/Fibrinous Exudate 5%    Peri-wound Assessment Erythema (blanchable)    Wound Length (cm) 4.1 cm   was 4.8   Wound Width (cm) 2.8 cm   was 3.2   Wound Depth (cm) 0.3 cm   was 1   Wound Volume (cm^3) 3.44 cm^3    Wound Surface Area (cm^2) 11.48 cm^2    Margins Attached edges (approximated)    Drainage Amount Minimal    Drainage Description Serosanguineous    Treatment Cleansed;Debridement (Selective)    Wound Properties Date First Assessed: 01/03/21 Wound Type: Diabetic ulcer Location: Tibial Location Orientation: Distal;Posterior;Right Wound Description (Comments): right posterior wound Present on Admission: Yes   Dressing Type Compression wrap;Impregnated gauze (bismuth);Alginate    Dressing Changed Changed    Dressing Status Old drainage    Dressing Change Frequency PRN    Site / Wound Assessment Bleeding;Granulation tissue;Red    % Wound base Red or Granulating 100%    % Wound base Yellow/Fibrinous Exudate 0%    Peri-wound Assessment Erythema (blanchable)    Wound Length (cm) 2 cm   was 3.2   Wound Width (cm) 2.2 cm   was 3.2   Wound Depth (cm) 0 cm    Wound Volume (cm^3) 0 cm^3    Wound Surface Area (cm^2) 4.4 cm^2    Margins Attached edges (approximated)    Drainage Amount Minimal    Drainage Description Serosanguineous    Treatment Cleansed;Debridement (Selective)    Selective Debridement - Location wound beds    Selective Debridement - Tools Used Scalpel;Forceps    Selective Debridement - Tissue Removed wound beds, devitalized tissue and dead skin    Wound Therapy - Clinical Statement Wounds continues to approximate. One small spot of slight adherent slough on lateral wound but able to remove a bit today and continued to crosshatch this area. Conitnued with xeroform,  alginate adn then foam and profore lite. Will continue with current POC as tolerated.    Wound Therapy - Functional Problem List difficulty with dressing    Factors Delaying/Impairing Wound Healing Vascular compromise;Infection - systemic/local;Diabetes Mellitus   recent infection, heart disease   Wound Therapy - Frequency 2X / week    Wound Therapy - Current Recommendations PT    Wound Plan continue with debridement and appropriate dressing change.  Take photos and measure on MONDAYS.  Continue with profore until receives butler changing weekly on THURSDAYS.    Dressing  lateral OZ:[id number],VQQVZDG alginate, 1/2" foam,  profore lite    Dressing medial posterior  LE: xerform,  alginate, gauze, 1/2 inch foam,  profore lite                     PT Short Term Goals - 02/20/21 1320      PT SHORT TERM GOAL #1   Title Wound on left leg will be completely healed to reduce risk of infection.    Time 4    Period Weeks    Status Achieved    Target Date 01/31/21      PT SHORT TERM GOAL #2   Title Anterior lower wound on right leg will be 100% granulated to deomstrate improved wound healing.    Time 4    Period Weeks    Status On-going    Target Date 01/31/21      PT SHORT TERM GOAL #3   Title Posterior  wound on right leg will be 100% granulated to demonstrate improved wound healing.    Time 4    Period Weeks    Status Achieved    Target Date 01/31/21      PT SHORT TERM GOAL #4   Title Patient will report importance of wearing daily compression garments to reduce swelling and risk of forming blisters    Time 4    Period Weeks    Status Achieved    Target Date 02/28/21             PT Long Term Goals - 02/20/21 1321      PT LONG TERM GOAL #1   Time 8    Period Weeks    Status New      PT LONG TERM GOAL #2   Title Anterior lower wound on right leg will be completely healed to reduce risk of infection.    Time 8    Period Weeks    Status On-going      PT LONG TERM  GOAL #3   Title Posterior wound on right leg will be completely healed to reduce risk of infection.    Time 8    Period Weeks    Status On-going      PT LONG TERM GOAL #4   Title Patietn will report <3/10 pain in legs due to wounds to reduce pain with walking    Baseline continued pain with debridement    Time 8    Period Weeks    Status On-going                 Plan - 02/27/21 1209    Clinical Impression Statement see above    Personal Factors and Comorbidities Comorbidity 1;Comorbidity 2;Comorbidity 3+    Comorbidities pacemaker, afib, on anticoagulents, vaicose veins.    Examination-Activity Limitations Locomotion Level;Hygiene/Grooming;Stand    Examination-Participation Restrictions Community Activity    Stability/Clinical Decision Making Stable/Uncomplicated    PT Frequency 2x / week    PT Duration 4 weeks    PT Treatment/Interventions ADLs/Self Care Home Management;Manual techniques;Other (comment)   wound care, modalities   PT Next Visit Plan continue with debridement and dressing changes - measure wounds    PT Home Exercise Plan ankle pumps    Consulted and Agree with Plan of Care Patient           Patient will benefit from skilled therapeutic intervention in order to improve the following deficits and impairments:  Other (comment),Pain,Increased edema,Decreased skin integrity,Decreased mobility,Decreased activity tolerance (wounds)  Visit Diagnosis: Leg wound, right, initial encounter  Pain in right leg  Difficulty in walking, not elsewhere classified  Problem List Patient Active Problem List   Diagnosis Date Noted  . Cellulitis and abscess of right lower extremity 12/24/2020  . Diabetes mellitus without complication ([place])   . SSS (sick sinus syndrome) ([place]) 07/27/2014  . Pacemaker syndrome 07/27/2014  . Tachy-brady syndrome ([place]) 07/27/2014  . Difficulty in walking(719.7) 11/03/2013  . Stiffness of right knee 11/03/2013  . Postoperative  anemia due to acute blood loss 10/06/2013  . OA (osteoarthritis) of knee 10/05/2013  . Preoperative cardiovascular examination 07/20/2013  . Chronic diastolic heart failure ([place]) 04/29/2013  . Long QT interval 04/21/2013  . Volume overload 04/21/2013  . Long term current use of anticoagulant therapy 01/09/2013  . HTN (hypertension) 05/20/2011  . Pleural effusion 05/20/2011  . Lactic acid acidosis 05/18/2011  . Pacemaker 05/18/2011  . Anemia 05/18/2011  . Acute renal failure (ARF) ([place]) 05/18/2011  . Pneumonia 05/17/2011  . Fever 05/17/2011  . Atrial fibrillation ([place]) 05/17/2011  . Hyperglycemia 05/17/2011  . Bradycardia 05/17/2011  . Gout 05/17/2011  . Arthritis 05/17/2011  . Thrombocytopenia ([place]) 05/17/2011   12:14 PM, 02/27/21 Jerene Pitch, DPT Physical Therapy with [facility]  [phone] office  [place] [address] [place], [place], [address] Phone: [phone]   Fax:  [phone]  Name: [name] MRN: [id number] Date of Birth: [birth year]

## 2021-03-01 ENCOUNTER — Ambulatory Visit (HOSPITAL_COMMUNITY): Payer: Medicare Other | Admitting: Physical Therapy

## 2021-03-02 ENCOUNTER — Ambulatory Visit (HOSPITAL_COMMUNITY): Payer: Self-pay | Admitting: Physical Therapy

## 2021-03-03 ENCOUNTER — Ambulatory Visit (HOSPITAL_COMMUNITY): Payer: Medicare Other | Admitting: Physical Therapy

## 2021-03-03 ENCOUNTER — Other Ambulatory Visit: Payer: Self-pay

## 2021-03-03 ENCOUNTER — Encounter (HOSPITAL_COMMUNITY): Payer: Self-pay | Admitting: Physical Therapy

## 2021-03-03 DIAGNOSIS — S81801A Unspecified open wound, right lower leg, initial encounter: Secondary | ICD-10-CM | POA: Diagnosis not present

## 2021-03-03 DIAGNOSIS — M79604 Pain in right leg: Secondary | ICD-10-CM

## 2021-03-03 NOTE — Therapy (Signed)
[place], [place], [address] Phone: [phone]   Fax:  [phone]  Wound Care Therapy  Patient Details  Name: [name] MRN: [id number] Date of Birth: September 29, 1934 Referring Provider (PT): Jeanella Craze, DO   Encounter Date: 03/03/2021   PT End of Session - 03/03/21 1556    Visit Number 24    Number of Visits 29    Date for PT Re-Evaluation 03/14/21    Authorization Type UHC medicare, no auth or VL    Progress Note Due on Visit 30    PT Start Time 1400    PT Stop Time 1435    PT Time Calculation (min) 35 min    Activity Tolerance Patient limited by pain    Behavior During Therapy [facility] for tasks assessed/performed           Past Medical History:  Diagnosis Date  . Arthritis   . Atrial fibrillation ([place])   . Barrett's esophagus   . CAD (coronary artery disease)    echo 09-05-2010-EF nl, mod-severe dilated Left atrium; myoview 02-08-12-EF 52%, no ischemia  . Cancer [facility])    prostate - radiation only  . Diabetes mellitus without complication ([place])    "prediabetic"-no oral meds  . GERD (gastroesophageal reflux disease)   . Gout   . H/O transesophageal echocardiography (TEE) for monitoring 09/05/10   tee guided AT pace termination, did not proceed with cardioversion due to termination of the atrial flutter through his pacemaker  . History of cardioversion 02/09/13   electrical synchronized cardioversion, on flecainide and warfarin  . History of kidney stones    per pt - no actual diagnosisi  . History of stomach ulcers    many yrs ago  . HTN (hypertension)   . Pacemaker    medtronic  . Pacemaker 09-25-13  . Prostate cancer ([place]) 09-25-13   tx. with radiation only- dx. 2013    Past Surgical History:  Procedure Laterality Date  . APPENDECTOMY    . CARDIAC CATHETERIZATION  02/18/06   EF 65%, focal napkin ring-like lesion into the prox LAD otherwise normal coronaries  . CARDIOVERSION N/A 02/09/2013    Procedure: CARDIOVERSION;  Surgeon: Sanda Klein, MD;  Location: [place] [facility]OS[facility];  Service: Cardiovascular;  Laterality: N/A;  . CATARACT EXTRACTION W/PHACO Left 12/23/2020   Procedure: CATARACT EXTRACTION PHACO AND INTRAOCULAR LENS PLACEMENT ([place]);  Surgeon: [name], MD;  Location[facility];  Service: Ophthalmology;  Laterality: Left;  CDE 18.55  . CATARACT EXTRACTION W/PHACO Right 01/09/2021   Procedure: CATARACT EXTRACTION PHACO AND INTRAOCULAR LENS PLACEMENT ([facility]);  Surgeon: [name], MD;  Location[facility];  Service: Ophthalmology;  Laterality: Right;  CDE: 28.65  . PACEMAKER GENERATOR CHANGE N/A 07/27/2014   Procedure: PACEMAKER GENERATOR CHANGE;  Surgeon: Sanda Klein, MD;  Location: [place] CATH LAB;  Service: Cardiovascular;  Laterality: N/A;  . PACEMAKER INSERTION     medtronic adapta- Dr. Sallyanne Kuster follows- LOV 9'14  . TONSILLECTOMY    . TOTAL KNEE ARTHROPLASTY Right 10/05/2013   Procedure: RIGHT TOTAL KNEE ARTHROPLASTY;  Surgeon: Gearlean Alf, MD;  [facility];  Service: Orthopedics;  Laterality: Right;    There were no vitals filed for this visit.               Wound Therapy - 03/03/21 0001    Subjective Pt states that he has no pain or issues    Patient and Family Stated Goals wounds to heal  Prior Treatments neosporin, gauze, antibitics    Pain Scale 0-10    Pain Score 0-No pain    Evaluation and Treatment Procedures Explained to Patient/Family Yes    Evaluation and Treatment Procedures agreed to    Wound Properties Date First Assessed: 01/03/21 Time First Assessed: 1430 Wound Type: Diabetic ulcer Location: Pretibial Location Orientation: Distal;Right Wound Description (Comments): right lower wound Present on Admission: Yes   Dressing Type Compression wrap    Dressing Changed Changed    Dressing Status Old drainage    Dressing Change Frequency PRN    Site / Wound Assessment Red;Yellow    % Wound base Red or Granulating 95%   after debridement;  slightly hypergranulating   % Wound base Yellow/Fibrinous Exudate 5%    Peri-wound Assessment Intact    Margins Attached edges (approximated)    Drainage Amount Moderate    Drainage Description Other (Comment)    Treatment Debridement (Selective);Cleansed;Other (Comment)    Wound Properties Date First Assessed: 01/03/21 Wound Type: Diabetic ulcer Location: Tibial Location Orientation: Distal;Posterior;Right Wound Description (Comments): right posterior wound Present on Admission: Yes   Dressing Type Compression wrap    Dressing Changed Changed    Dressing Status Old drainage    Dressing Change Frequency PRN    Site / Wound Assessment Granulation tissue    % Wound base Red or Granulating 100%   without debridement   Margins Attached edges (approximated)    Drainage Amount Scant    Drainage Description Serous    Treatment Cleansed    Selective Debridement - Location anterior wound    Selective Debridement - Tools Used Forceps    Selective Debridement - Tissue Removed slough    Wound Therapy - Clinical Statement upon removal of profore anterior wound appears to have more drainage.  Foam was used last treatment and a small blister is noted on the edge of the foam.  Therapist discarded the foam that was directly on top of the wounds and cut news foam for entire LE to disperse pressure more evenly and to stop hypergranualtion tissue.  Anticipate posterior wound being healed next week.    Wound Therapy - Functional Problem List difficulty with dressing    Factors Delaying/Impairing Wound Healing Vascular compromise;Infection - systemic/local;Diabetes Mellitus   recent infection, heart disease   Wound Therapy - Frequency 2X / week    Wound Therapy - Current Recommendations PT    Wound Plan continue with debridement and appropriate dressing change.  Take photos and measure on MONDAYS.                     PT Short Term Goals - 02/20/21 1320      PT SHORT TERM GOAL #1   Title Wound  on left leg will be completely healed to reduce risk of infection.    Time 4    Period Weeks    Status Achieved    Target Date 01/31/21      PT SHORT TERM GOAL #2   Title Anterior lower wound on right leg will be 100% granulated to deomstrate improved wound healing.    Time 4    Period Weeks    Status On-going    Target Date 01/31/21      PT SHORT TERM GOAL #3   Title Posterior  wound on right leg will be 100% granulated to demonstrate improved wound healing.    Time 4    Period Weeks    Status Achieved  Target Date 01/31/21      PT SHORT TERM GOAL #4   Title Patient will report importance of wearing daily compression garments to reduce swelling and risk of forming blisters    Time 4    Period Weeks    Status Achieved    Target Date 02/28/21             PT Long Term Goals - 02/20/21 1321      PT LONG TERM GOAL #1   Time 8    Period Weeks    Status New      PT LONG TERM GOAL #2   Title Anterior lower wound on right leg will be completely healed to reduce risk of infection.    Time 8    Period Weeks    Status On-going      PT LONG TERM GOAL #3   Title Posterior wound on right leg will be completely healed to reduce risk of infection.    Time 8    Period Weeks    Status On-going      PT LONG TERM GOAL #4   Title Patietn will report <3/10 pain in legs due to wounds to reduce pain with walking    Baseline continued pain with debridement    Time 8    Period Weeks    Status On-going                 Plan - 03/03/21 1557    Clinical Impression Statement see above    Personal Factors and Comorbidities Comorbidity 1;Comorbidity 2;Comorbidity 3+    Comorbidities pacemaker, afib, on anticoagulents, vaicose veins.    Examination-Activity Limitations Locomotion Level;Hygiene/Grooming;Stand    Examination-Participation Restrictions Community Activity    Stability/Clinical Decision Making Stable/Uncomplicated    PT Frequency 2x / week    PT Duration 4 weeks     PT Treatment/Interventions ADLs/Self Care Home Management;Manual techniques;Other (comment)   wound care, modalities   PT Next Visit Plan continue with debridement and dressing changes - measure wounds    PT Home Exercise Plan ankle pumps    Consulted and Agree with Plan of Care Patient           Patient will benefit from skilled therapeutic intervention in order to improve the following deficits and impairments:  Other (comment),Pain,Increased edema,Decreased skin integrity,Decreased mobility,Decreased activity tolerance (wounds)  Visit Diagnosis: Leg wound, right, initial encounter  Pain in right leg     Problem List Patient Active Problem List   Diagnosis Date Noted  . Cellulitis and abscess of right lower extremity 12/24/2020  . Diabetes mellitus without complication ([place])   . SSS (sick sinus syndrome) ([place]) 07/27/2014  . Pacemaker syndrome 07/27/2014  . Tachy-brady syndrome ([place]) 07/27/2014  . Difficulty in walking(719.7) 11/03/2013  . Stiffness of right knee 11/03/2013  . Postoperative anemia due to acute blood loss 10/06/2013  . OA (osteoarthritis) of knee 10/05/2013  . Preoperative cardiovascular examination 07/20/2013  . Chronic diastolic heart failure ([place]) 04/29/2013  . Long QT interval 04/21/2013  . Volume overload 04/21/2013  . Long term current use of anticoagulant therapy 01/09/2013  . HTN (hypertension) 05/20/2011  . Pleural effusion 05/20/2011  . Lactic acid acidosis 05/18/2011  . Pacemaker 05/18/2011  . Anemia 05/18/2011  . Acute renal failure (ARF) ([place]) 05/18/2011  . Pneumonia 05/17/2011  . Fever 05/17/2011  . Atrial fibrillation ([place]) 05/17/2011  . Hyperglycemia 05/17/2011  . Bradycardia 05/17/2011  . Gout 05/17/2011  . Arthritis 05/17/2011  .  Thrombocytopenia ([place]) 05/17/2011    Rayetta Humphrey, PT CLT [phone] 03/03/2021, 3:58 PM  [place] [address] [place], [place],  [address] Phone: [phone]   Fax:  [phone]  Name: CHESKEL SILVERIO MRN: [id number] Date of Birth: May 23, 1934

## 2021-03-06 ENCOUNTER — Other Ambulatory Visit: Payer: Self-pay

## 2021-03-06 ENCOUNTER — Ambulatory Visit (HOSPITAL_COMMUNITY): Payer: Medicare Other | Attending: Internal Medicine | Admitting: Physical Therapy

## 2021-03-06 DIAGNOSIS — S81802A Unspecified open wound, left lower leg, initial encounter: Secondary | ICD-10-CM | POA: Diagnosis present

## 2021-03-06 DIAGNOSIS — S81801A Unspecified open wound, right lower leg, initial encounter: Secondary | ICD-10-CM | POA: Diagnosis present

## 2021-03-06 DIAGNOSIS — R262 Difficulty in walking, not elsewhere classified: Secondary | ICD-10-CM | POA: Diagnosis present

## 2021-03-06 DIAGNOSIS — M79604 Pain in right leg: Secondary | ICD-10-CM | POA: Diagnosis present

## 2021-03-06 NOTE — Therapy (Signed)
[place], [place], [address] Phone: [phone]   Fax:  [phone]  Wound Care Therapy  Patient Details  Name: [name] MRN: [id number] Date of Birth: 1934/03/06 Referring Provider (PT): Jeanella Craze, DO   Encounter Date: 03/06/2021   PT End of Session - 03/06/21 1522    Visit Number 25    Number of Visits 29    Date for PT Re-Evaluation 03/14/21    Authorization Type UHC medicare, no auth or VL    Progress Note Due on Visit 30    PT Start Time 1130    PT Stop Time 1215    PT Time Calculation (min) 45 min    Activity Tolerance Patient limited by pain    Behavior During Therapy [facility] for tasks assessed/performed           Past Medical History:  Diagnosis Date  . Arthritis   . Atrial fibrillation ([place])   . Barrett's esophagus   . CAD (coronary artery disease)    echo 09-05-2010-EF nl, mod-severe dilated Left atrium; myoview 02-08-12-EF 52%, no ischemia  . Cancer [facility])    prostate - radiation only  . Diabetes mellitus without complication ([place])    "prediabetic"-no oral meds  . GERD (gastroesophageal reflux disease)   . Gout   . H/O transesophageal echocardiography (TEE) for monitoring 09/05/10   tee guided AT pace termination, did not proceed with cardioversion due to termination of the atrial flutter through his pacemaker  . History of cardioversion 02/09/13   electrical synchronized cardioversion, on flecainide and warfarin  . History of kidney stones    per pt - no actual diagnosisi  . History of stomach ulcers    many yrs ago  . HTN (hypertension)   . Pacemaker    medtronic  . Pacemaker 09-25-13  . Prostate cancer ([place]) 09-25-13   tx. with radiation only- dx. 2013    Past Surgical History:  Procedure Laterality Date  . APPENDECTOMY    . CARDIAC CATHETERIZATION  02/18/06   EF 65%, focal napkin ring-like lesion into the prox LAD otherwise normal coronaries  . CARDIOVERSION N/A 02/09/2013    Procedure: CARDIOVERSION;  Surgeon: Sanda Klein, MD;  Location: [facility];  Service: Cardiovascular;  Laterality: N/A;  . CATARACT EXTRACTION W/PHACO Left 12/23/2020   Procedure: CATARACT EXTRACTION PHACO AND INTRAOCULAR LENS PLACEMENT ([place]);  Surgeon: [name], MD;  Location: [facility];  Service: Ophthalmology;  Laterality: Left;  CDE 18.55  . CATARACT EXTRACTION W/PHACO Right 01/09/2021   Procedure: CATARACT EXTRACTION PHACO AND INTRAOCULAR LENS PLACEMENT ([facility]);  Surgeon: [name], MD;  Location: [facility];  Service: Ophthalmology;  Laterality: Right;  CDE: 28.65  . PACEMAKER GENERATOR CHANGE N/A 07/27/2014   Procedure: PACEMAKER GENERATOR CHANGE;  Surgeon: Sanda Klein, MD;  Location: [facility];  Service: Cardiovascular;  Laterality: N/A;  . PACEMAKER INSERTION     medtronic adapta- Dr. Sallyanne Kuster follows- LOV 9'14  . TONSILLECTOMY    . TOTAL KNEE ARTHROPLASTY Right 10/05/2013   Procedure: RIGHT TOTAL KNEE ARTHROPLASTY;  Surgeon: Gearlean Alf, MD;  [facility];  Service: Orthopedics;  Laterality: Right;    There were no vitals filed for this visit.               Wound Therapy - 03/06/21 1508    Subjective Pt states that he has no pain or issues    Patient and Family Stated Goals wounds to heal  Prior Treatments neosporin, gauze, antibitics    Pain Scale 0-10    Pain Score 0-No pain    Evaluation and Treatment Procedures Explained to Patient/Family Yes    Evaluation and Treatment Procedures agreed to    Wound Properties Date First Assessed: 01/03/21 Time First Assessed: 1430 Wound Type: Diabetic ulcer Location: Pretibial Location Orientation: Distal;Right Wound Description (Comments): right lower wound Present on Admission: Yes   Wound Image View All Images View Images    Dressing Type Compression wrap;Silver dressings    Dressing Changed Changed    Dressing Status Old drainage    Dressing Change Frequency PRN    Site / Wound Assessment Red    % Wound  base Red or Granulating 100%    Peri-wound Assessment Intact    Wound Length (cm) 4.5 cm    Wound Width (cm) 2.3 cm    Wound Depth (cm) 0 cm   0.3 cm hypergranulation   Wound Volume (cm^3) 0 cm^3    Wound Surface Area (cm^2) 10.35 cm^2    Margins Attached edges (approximated)    Drainage Amount Minimal    Drainage Description Serosanguineous    Treatment Cleansed;Debridement (Selective)    Wound Properties Date First Assessed: 01/03/21 Wound Type: Diabetic ulcer Location: Tibial Location Orientation: Distal;Posterior;Right Wound Description (Comments): right posterior wound Present on Admission: Yes   Dressing Type Compression wrap;Silver hydrofiber    Dressing Changed Changed    Dressing Status Old drainage    Dressing Change Frequency PRN    Site / Wound Assessment Granulation tissue    % Wound base Red or Granulating 100%    Wound Length (cm) 2.5 cm    Wound Width (cm) 2 cm    Wound Depth (cm) 0 cm   0.3cm hypergranulation   Wound Volume (cm^3) 0 cm^3    Wound Surface Area (cm^2) 5 cm^2    Margins Attached edges (approximated)    Drainage Amount Scant    Drainage Description Serosanguineous    Treatment Cleansed;Debridement (Selective)    Selective Debridement - Location anterior wound    Selective Debridement - Tools Used Forceps    Selective Debridement - Tissue Removed slough    Wound Therapy - Clinical Statement Pt returns with dressing intact Rt LE and new dressing applied to distal Lt LE as he has several small abrasions from doffing his compression stocking.  3 very small areas cleansed and dressed with xeroform.  Rt LE remeasured and  overall reducing but continues to have 0.3cm hypergranulation to both wounds.  Cleansed well and debrided edges to promote approximation.  Continued with silver hydrofiber and " foam.    Wound Therapy - Functional Problem List difficulty with dressing    Factors Delaying/Impairing Wound Healing Vascular compromise;Infection -  systemic/local;Diabetes Mellitus   recent infection, heart disease   Wound Therapy - Frequency 2X / week    Wound Therapy - Current Recommendations PT    Wound Plan continue with debridement and appropriate dressing change.  Take photos and measure on MONDAYS.                     PT Short Term Goals - 02/20/21 1320      PT SHORT TERM GOAL #1   Title Wound on left leg will be completely healed to reduce risk of infection.    Time 4    Period Weeks    Status Achieved    Target Date 01/31/21      PT SHORT TERM  GOAL #2   Title Anterior lower wound on right leg will be 100% granulated to deomstrate improved wound healing.    Time 4    Period Weeks    Status On-going    Target Date 01/31/21      PT SHORT TERM GOAL #3   Title Posterior  wound on right leg will be 100% granulated to demonstrate improved wound healing.    Time 4    Period Weeks    Status Achieved    Target Date 01/31/21      PT SHORT TERM GOAL #4   Title Patient will report importance of wearing daily compression garments to reduce swelling and risk of forming blisters    Time 4    Period Weeks    Status Achieved    Target Date 02/28/21             PT Long Term Goals - 02/20/21 1321      PT LONG TERM GOAL #1   Time 8    Period Weeks    Status New      PT LONG TERM GOAL #2   Title Anterior lower wound on right leg will be completely healed to reduce risk of infection.    Time 8    Period Weeks    Status On-going      PT LONG TERM GOAL #3   Title Posterior wound on right leg will be completely healed to reduce risk of infection.    Time 8    Period Weeks    Status On-going      PT LONG TERM GOAL #4   Title Patietn will report <3/10 pain in legs due to wounds to reduce pain with walking    Baseline continued pain with debridement    Time 8    Period Weeks    Status On-going                  Patient will benefit from skilled therapeutic intervention in order to improve the  following deficits and impairments:     Visit Diagnosis: Leg wound, right, initial encounter  Pain in right leg  Difficulty in walking, not elsewhere classified  Open leg wound, left, initial encounter     Problem List Patient Active Problem List   Diagnosis Date Noted  . Cellulitis and abscess of right lower extremity 12/24/2020  . Diabetes mellitus without complication ([place])   . SSS (sick sinus syndrome) ([place]) 07/27/2014  . Pacemaker syndrome 07/27/2014  . Tachy-brady syndrome ([place]) 07/27/2014  . Difficulty in walking(719.7) 11/03/2013  . Stiffness of right knee 11/03/2013  . Postoperative anemia due to acute blood loss 10/06/2013  . OA (osteoarthritis) of knee 10/05/2013  . Preoperative cardiovascular examination 07/20/2013  . Chronic diastolic heart failure ([place]) 04/29/2013  . Long QT interval 04/21/2013  . Volume overload 04/21/2013  . Long term current use of anticoagulant therapy 01/09/2013  . HTN (hypertension) 05/20/2011  . Pleural effusion 05/20/2011  . Lactic acid acidosis 05/18/2011  . Pacemaker 05/18/2011  . Anemia 05/18/2011  . Acute renal failure (ARF) ([place]) 05/18/2011  . Pneumonia 05/17/2011  . Fever 05/17/2011  . Atrial fibrillation ([place]) 05/17/2011  . Hyperglycemia 05/17/2011  . Bradycardia 05/17/2011  . Gout 05/17/2011  . Arthritis 05/17/2011  . Thrombocytopenia ([place]) 05/17/2011   Teena Irani, PTA/CLT [id number]  Teena Irani 03/06/2021, 3:25 PM  [place] [address] [place], [place], [address] Phone: [phone]  Fax:  [phone]  Name: ADARIUS TIGGES MRN: [id number] Date of Birth: 09/12/1934

## 2021-03-08 ENCOUNTER — Ambulatory Visit (INDEPENDENT_AMBULATORY_CARE_PROVIDER_SITE_OTHER): Payer: Medicare Other | Admitting: *Deleted

## 2021-03-08 ENCOUNTER — Ambulatory Visit (HOSPITAL_COMMUNITY): Payer: Medicare Other

## 2021-03-08 DIAGNOSIS — Z7901 Long term (current) use of anticoagulants: Secondary | ICD-10-CM

## 2021-03-08 DIAGNOSIS — I4891 Unspecified atrial fibrillation: Secondary | ICD-10-CM

## 2021-03-08 LAB — POCT INR: INR: 2 (ref 2.0–3.0)

## 2021-03-08 NOTE — Patient Instructions (Signed)
Increase warfarin to 1 tablet daily except 1/2 tablet on Tuesdays and Fridays  Repeat INR in 4 weeks.

## 2021-03-09 ENCOUNTER — Encounter (HOSPITAL_COMMUNITY): Payer: Self-pay | Admitting: Physical Therapy

## 2021-03-09 ENCOUNTER — Ambulatory Visit (HOSPITAL_COMMUNITY): Payer: Medicare Other | Admitting: Physical Therapy

## 2021-03-09 ENCOUNTER — Other Ambulatory Visit: Payer: Self-pay

## 2021-03-09 DIAGNOSIS — S81801A Unspecified open wound, right lower leg, initial encounter: Secondary | ICD-10-CM | POA: Diagnosis not present

## 2021-03-09 DIAGNOSIS — M79604 Pain in right leg: Secondary | ICD-10-CM

## 2021-03-09 DIAGNOSIS — R262 Difficulty in walking, not elsewhere classified: Secondary | ICD-10-CM

## 2021-03-09 DIAGNOSIS — S81802A Unspecified open wound, left lower leg, initial encounter: Secondary | ICD-10-CM

## 2021-03-09 NOTE — Therapy (Signed)
[place], [place], [address] Phone: [phone]   Fax:  [phone]  Wound Care Therapy  Patient Details  Name: [name] MRN: [id number] Date of Birth: 1933/11/14 Referring Provider (PT): Jeanella Craze, DO   Encounter Date: 03/09/2021   PT End of Session - 03/09/21 1301    Visit Number 26    Number of Visits 29    Date for PT Re-Evaluation 03/14/21    Authorization Type UHC medicare, no auth or VL    Progress Note Due on Visit 30    PT Start Time 1050    PT Stop Time 1150    PT Time Calculation (min) 60 min    Activity Tolerance Patient limited by pain    Behavior During Therapy [facility] for tasks assessed/performed           Past Medical History:  Diagnosis Date  . Arthritis   . Atrial fibrillation ([place])   . Barrett's esophagus   . CAD (coronary artery disease)    echo 09-05-2010-EF nl, mod-severe dilated Left atrium; myoview 02-08-12-EF 52%, no ischemia  . Cancer [facility])    prostate - radiation only  . Diabetes mellitus without complication ([place])    "prediabetic"-no oral meds  . GERD (gastroesophageal reflux disease)   . Gout   . H/O transesophageal echocardiography (TEE) for monitoring 09/05/10   tee guided AT pace termination, did not proceed with cardioversion due to termination of the atrial flutter through his pacemaker  . History of cardioversion 02/09/13   electrical synchronized cardioversion, on flecainide and warfarin  . History of kidney stones    per pt - no actual diagnosisi  . History of stomach ulcers    many yrs ago  . HTN (hypertension)   . Pacemaker    medtronic  . Pacemaker 09-25-13  . Prostate cancer ([place]) 09-25-13   tx. with radiation only- dx. 2013    Past Surgical History:  Procedure Laterality Date  . APPENDECTOMY    . CARDIAC CATHETERIZATION  02/18/06   EF 65%, focal napkin ring-like lesion into the prox LAD otherwise normal coronaries  . CARDIOVERSION N/A 02/09/2013    Procedure: CARDIOVERSION;  Surgeon: Sanda Klein, MD;  Location: [facility];  Service: Cardiovascular;  Laterality: N/A;  . CATARACT EXTRACTION W/PHACO Left 12/23/2020   Procedure: CATARACT EXTRACTION PHACO AND INTRAOCULAR LENS PLACEMENT ([place]);  Surgeon: [name], MD;  [facility];  Service: Ophthalmology;  Laterality: Left;  CDE 18.55  . CATARACT EXTRACTION W/PHACO Right 01/09/2021   Procedure: CATARACT EXTRACTION PHACO AND INTRAOCULAR LENS PLACEMENT ([facility]);  Surgeon: [name], MD;  [facility];  Service: Ophthalmology;  Laterality: Right;  CDE: 28.65  . PACEMAKER GENERATOR CHANGE N/A 07/27/2014   Procedure: PACEMAKER GENERATOR CHANGE;  Surgeon: Sanda Klein, MD;  Location: [facility];  Service: Cardiovascular;  Laterality: N/A;  . PACEMAKER INSERTION     medtronic adapta- Dr. Sallyanne Kuster follows- LOV 9'14  . TONSILLECTOMY    . TOTAL KNEE ARTHROPLASTY Right 10/05/2013   Procedure: RIGHT TOTAL KNEE ARTHROPLASTY;  Surgeon: Gearlean Alf, MD;  [facility];  Service: Orthopedics;  Laterality: Right;    There were no vitals filed for this visit.      [facility] PT Assessment - 03/09/21 0001      Assessment   Medical Diagnosis wounds on right leg    Referring Provider (PT) Pratick Manuella Ghazi, DO  Wound Therapy - 03/09/21 0001    Subjective Reports no pain. has kept bandages in place    Patient and Family Stated Goals wounds to heal    Prior Treatments neosporin, gauze, antibitics    Evaluation and Treatment Procedures Explained to Patient/Family Yes    Evaluation and Treatment Procedures agreed to    Wound Properties Date First Assessed: 01/03/21 Time First Assessed: 1430 Wound Type: Diabetic ulcer Location: Pretibial Location Orientation: Distal;Right Wound Description (Comments): right lower wound Present on Admission: Yes   Dressing Type Compression wrap;Silver hydrofiber    Dressing Changed Changed    Dressing Status Old drainage     Dressing Change Frequency PRN    Site / Wound Assessment Red;Bleeding;Yellow;Painful;Granulation tissue    % Wound base Yellow/Fibrinous Exudate 90%    % Wound base Black/Eschar 10%    Peri-wound Assessment Intact    Margins Epibole (rolled edges)   from 7-9 o'clock   Drainage Amount Moderate    Drainage Description Green;Serosanguineous    Treatment Cleansed;Debridement (Selective)    Wound Properties Date First Assessed: 01/03/21 Wound Type: Diabetic ulcer Location: Tibial Location Orientation: Distal;Posterior;Right Wound Description (Comments): right posterior wound Present on Admission: Yes   Dressing Type Compression wrap;Silver hydrofiber    Dressing Changed Changed    Dressing Status Old drainage    Dressing Change Frequency PRN    Site / Wound Assessment Granulation tissue;Red    % Wound base Red or Granulating 100%    Margins Attached edges (approximated)    Drainage Amount Minimal    Drainage Description Serosanguineous    Treatment Cleansed;Debridement (Selective)    Selective Debridement - Location anterior wound    Selective Debridement - Tools Used Forceps    Selective Debridement - Tissue Removed slough    Wound Therapy - Clinical Statement Patient with dressings in place. Green drainage noted from right lateral wound. Dry dead skin surrounding both wound beds on right leg. Performed soft tissue mobilization to periwounds on right leg and removed dead periwound tissue. Minimal hyper granulation noted with either leg. Lateral wound on right leg with one spot with continued slough and epiboled edges (from 7-9 o'clock). Attempted to open edges but very painful for patient, scratched surface as best as possible. Continued with silver hydrofiber on anterior lateral wound and then put xeroform and alginate along posterior wound. Left leg with 5 raw spots/blisters after dead skin removed with washing of leg. Swelling noted above original dressing, changed to profore lite to manage  swelling and applied xeroform. Will continue with current POC as tolerated.    Wound Therapy - Functional Problem List difficulty with dressing    Factors Delaying/Impairing Wound Healing Vascular compromise;Infection - systemic/local;Diabetes Mellitus   recent infection, heart disease   Wound Therapy - Frequency 2X / week    Wound Therapy - Current Recommendations PT    Wound Plan continue with debridement and appropriate dressing change.  Take photos and measure on MONDAYS.    Dressing  lateral UR:KYHCWC hydrofiber, profore lite    Dressing posterior  LE: xerform,  alginate, gauze,  profore lite    Dressing left leg- xeroform to blisters then profore lite                     PT Short Term Goals - 02/20/21 1320      PT SHORT TERM GOAL #1   Title Wound on left leg will be completely healed to reduce risk of infection.    Time  4    Period Weeks    Status Achieved    Target Date 01/31/21      PT SHORT TERM GOAL #2   Title Anterior lower wound on right leg will be 100% granulated to deomstrate improved wound healing.    Time 4    Period Weeks    Status On-going    Target Date 01/31/21      PT SHORT TERM GOAL #3   Title Posterior  wound on right leg will be 100% granulated to demonstrate improved wound healing.    Time 4    Period Weeks    Status Achieved    Target Date 01/31/21      PT SHORT TERM GOAL #4   Title Patient will report importance of wearing daily compression garments to reduce swelling and risk of forming blisters    Time 4    Period Weeks    Status Achieved    Target Date 02/28/21             PT Long Term Goals - 02/20/21 1321      PT LONG TERM GOAL #1   Time 8    Period Weeks    Status New      PT LONG TERM GOAL #2   Title Anterior lower wound on right leg will be completely healed to reduce risk of infection.    Time 8    Period Weeks    Status On-going      PT LONG TERM GOAL #3   Title Posterior wound on right leg will be  completely healed to reduce risk of infection.    Time 8    Period Weeks    Status On-going      PT LONG TERM GOAL #4   Title Patietn will report <3/10 pain in legs due to wounds to reduce pain with walking    Baseline continued pain with debridement    Time 8    Period Weeks    Status On-going                 Plan - 03/09/21 1302    Clinical Impression Statement see above    Personal Factors and Comorbidities Comorbidity 1;Comorbidity 2;Comorbidity 3+    Comorbidities pacemaker, afib, on anticoagulents, vaicose veins.    Examination-Activity Limitations Locomotion Level;Hygiene/Grooming;Stand    Examination-Participation Restrictions Community Activity    Stability/Clinical Decision Making Stable/Uncomplicated    PT Frequency 2x / week    PT Duration 4 weeks    PT Treatment/Interventions ADLs/Self Care Home Management;Manual techniques;Other (comment)   wound care, modalities   PT Next Visit Plan continue with debridement and dressing changes - measure wounds    PT Home Exercise Plan ankle pumps    Consulted and Agree with Plan of Care Patient           Patient will benefit from skilled therapeutic intervention in order to improve the following deficits and impairments:  Other (comment),Pain,Increased edema,Decreased skin integrity,Decreased mobility,Decreased activity tolerance (wounds)  Visit Diagnosis: Leg wound, right, initial encounter  Pain in right leg  Difficulty in walking, not elsewhere classified  Open leg wound, left, initial encounter     Problem List Patient Active Problem List   Diagnosis Date Noted  . Cellulitis and abscess of right lower extremity 12/24/2020  . Diabetes mellitus without complication ([place])   . SSS (sick sinus syndrome) ([place]) 07/27/2014  . Pacemaker syndrome 07/27/2014  . Tachy-brady syndrome ([place]) 07/27/2014  . Difficulty  in walking(719.7) 11/03/2013  . Stiffness of right knee 11/03/2013  . Postoperative anemia due to  acute blood loss 10/06/2013  . OA (osteoarthritis) of knee 10/05/2013  . Preoperative cardiovascular examination 07/20/2013  . Chronic diastolic heart failure ([place]) 04/29/2013  . Long QT interval 04/21/2013  . Volume overload 04/21/2013  . Long term current use of anticoagulant therapy 01/09/2013  . HTN (hypertension) 05/20/2011  . Pleural effusion 05/20/2011  . Lactic acid acidosis 05/18/2011  . Pacemaker 05/18/2011  . Anemia 05/18/2011  . Acute renal failure (ARF) ([place]) 05/18/2011  . Pneumonia 05/17/2011  . Fever 05/17/2011  . Atrial fibrillation ([place]) 05/17/2011  . Hyperglycemia 05/17/2011  . Bradycardia 05/17/2011  . Gout 05/17/2011  . Arthritis 05/17/2011  . Thrombocytopenia ([place]) 05/17/2011    1:12 PM, 03/09/21 Jerene Pitch, DPT Physical Therapy with [facility]  [phone] office  [place] [address] [place], [place], [address] Phone: [phone]   Fax:  [phone]  Name: [name] MRN: [id number] Date of Birth: [birth year]

## 2021-03-10 ENCOUNTER — Ambulatory Visit (HOSPITAL_COMMUNITY): Payer: Medicare Other | Admitting: Physical Therapy

## 2021-03-13 ENCOUNTER — Encounter (HOSPITAL_COMMUNITY): Payer: Self-pay | Admitting: Physical Therapy

## 2021-03-13 ENCOUNTER — Other Ambulatory Visit: Payer: Self-pay

## 2021-03-13 ENCOUNTER — Ambulatory Visit (HOSPITAL_COMMUNITY): Payer: Medicare Other | Admitting: Physical Therapy

## 2021-03-13 DIAGNOSIS — S81801A Unspecified open wound, right lower leg, initial encounter: Secondary | ICD-10-CM

## 2021-03-13 DIAGNOSIS — M79604 Pain in right leg: Secondary | ICD-10-CM

## 2021-03-13 DIAGNOSIS — R262 Difficulty in walking, not elsewhere classified: Secondary | ICD-10-CM

## 2021-03-13 NOTE — Therapy (Signed)
[place], [place], [address] Phone: [phone]   Fax:  [phone]  Wound Care Therapy  Patient Details  Name: [name] MRN: [id number] Date of Birth: 11-16-1933 Referring Provider (PT): Jeanella Craze, DO   Encounter Date: 03/13/2021   PT End of Session - 03/13/21 1238    Visit Number 27    Number of Visits 29    Date for PT Re-Evaluation 03/14/21    Authorization Type UHC medicare, no auth or VL    Progress Note Due on Visit 30    PT Start Time 1140    PT Stop Time 1210    PT Time Calculation (min) 30 min    Activity Tolerance Patient limited by pain    Behavior During Therapy [facility] for tasks assessed/performed           Past Medical History:  Diagnosis Date  . Arthritis   . Atrial fibrillation ([place])   . Barrett's esophagus   . CAD (coronary artery disease)    echo 09-05-2010-EF nl, mod-severe dilated Left atrium; myoview 02-08-12-EF 52%, no ischemia  . Cancer [facility])    prostate - radiation only  . Diabetes mellitus without complication ([place])    "prediabetic"-no oral meds  . GERD (gastroesophageal reflux disease)   . Gout   . H/O transesophageal echocardiography (TEE) for monitoring 09/05/10   tee guided AT pace termination, did not proceed with cardioversion due to termination of the atrial flutter through his pacemaker  . History of cardioversion 02/09/13   electrical synchronized cardioversion, on flecainide and warfarin  . History of kidney stones    per pt - no actual diagnosisi  . History of stomach ulcers    many yrs ago  . HTN (hypertension)   . Pacemaker    medtronic  . Pacemaker 09-25-13  . Prostate cancer ([place]) 09-25-13   tx. with radiation only- dx. 2013    Past Surgical History:  Procedure Laterality Date  . APPENDECTOMY    . CARDIAC CATHETERIZATION  02/18/06   EF 65%, focal napkin ring-like lesion into the prox LAD otherwise normal coronaries  . CARDIOVERSION N/A 02/09/2013    Procedure: CARDIOVERSION;  Surgeon: Sanda Klein, MD;  Location: [facility];  Service: Cardiovascular;  Laterality: N/A;  . CATARACT EXTRACTION W/PHACO Left 12/23/2020   Procedure: CATARACT EXTRACTION PHACO AND INTRAOCULAR LENS PLACEMENT ([place]);  Surgeon: [name], MD;  [facility];  Service: Ophthalmology;  Laterality: Left;  CDE 18.55  . CATARACT EXTRACTION W/PHACO Right 01/09/2021   Procedure: CATARACT EXTRACTION PHACO AND INTRAOCULAR LENS PLACEMENT (IOC);  Surgeon: [name], MD;  Location: [facility];  Service: Ophthalmology;  Laterality: Right;  CDE: 28.65  . PACEMAKER GENERATOR CHANGE N/A 07/27/2014   Procedure: PACEMAKER GENERATOR CHANGE;  Surgeon: Sanda Klein, MD;  Location: [facility] CATH LAB;  Service: Cardiovascular;  Laterality: N/A;  . PACEMAKER INSERTION     medtronic adapta- Dr. Sallyanne Kuster follows- LOV 9'14  . TONSILLECTOMY    . TOTAL KNEE ARTHROPLASTY Right 10/05/2013   Procedure: RIGHT TOTAL KNEE ARTHROPLASTY;  Surgeon: Gearlean Alf, MD;  Location[facility];  Service: Orthopedics;  Laterality: Right;    There were no vitals filed for this visit.               Wound Therapy - 03/13/21 0001    Subjective Pt states he has no real pain with his leg at this time.    Patient and Family Stated Goals wounds  to heal    Prior Treatments neosporin, gauze, antibitics    Pain Scale 0-10    Pain Score 0-No pain    Evaluation and Treatment Procedures Explained to Patient/Family Yes    Evaluation and Treatment Procedures agreed to    Wound Properties Date First Assessed: 01/03/21 Time First Assessed: 1430 Wound Type: Diabetic ulcer Location: Pretibial Location Orientation: Distal;Right Wound Description (Comments): right lower wound Present on Admission: Yes   Wound Image View All Images View Images    Dressing Type Compression wrap;Santyl;Pressure dressing    Dressing Changed Changed    Dressing Status Old drainage    Dressing Change Frequency PRN    Site / Wound  Assessment Red;Friable    % Wound base Red or Granulating 90%    % Wound base Yellow/Fibrinous Exudate 10%    Peri-wound Assessment Intact    Wound Length (cm) 3.9 cm   area above that is red but it is intact   Wound Width (cm) 2.1 cm    Wound Surface Area (cm^2) 8.19 cm^2    Margins Epibole (rolled edges)    Drainage Amount Minimal    Drainage Description Serosanguineous    Treatment Cleansed;Debridement (Selective)    Wound Properties Date First Assessed: 01/03/21 Wound Type: Diabetic ulcer Location: Tibial Location Orientation: Distal;Posterior;Right Wound Description (Comments): right posterior wound Present on Admission: Yes   Dressing Type Compression wrap    Dressing Changed Changed    Dressing Status Old drainage    Dressing Change Frequency PRN    Site / Wound Assessment Granulation tissue    % Wound base Red or Granulating 100%    Treatment Cleansed    Selective Debridement - Location attempted anterior wound but very tender    Selective Debridement - Tools Used Forceps    Selective Debridement - Tissue Removed biofilm    Wound Therapy - Clinical Statement Wound on posterior aspect is basically healed no treatment today.  Anterior wound continues to be painful with debridement there is no more slough but there is a biofilm that therapist attempted to remove.  Noted hypergranulation at anterior wound site therefore therapist placec 1/4" foam on the wound when dressing.  Therapist was going to use medipore tape to compresss but pt stated that he could not have any tape of any kind on his skin.  Pt out of santyl therapist does not feel he needs to renew will use either medihoney or xeroform depending on how wound is looking next session.  If wound is looking good may want to decrease to once a week to allow more granualtion to occure prior to exposing wound.    Wound Therapy - Functional Problem List difficulty with dressing    Factors Delaying/Impairing Wound Healing Vascular  compromise;Infection - systemic/local;Diabetes Mellitus   recent infection, heart disease   Wound Therapy - Frequency 2X / week    Wound Therapy - Current Recommendations PT    Wound Plan continue with debridement and appropriate dressing change.  Take photos and measure on MONDAYS.    Dressing  lateral santyl, 2x2, 1/4 " foam and proform lite    Dressing none                     PT Short Term Goals - 02/20/21 1320      PT SHORT TERM GOAL #1   Title Wound on left leg will be completely healed to reduce risk of infection.    Time 4    Period  Weeks    Status Achieved    Target Date 01/31/21      PT SHORT TERM GOAL #2   Title Anterior lower wound on right leg will be 100% granulated to deomstrate improved wound healing.    Time 4    Period Weeks    Status On-going    Target Date 01/31/21      PT SHORT TERM GOAL #3   Title Posterior  wound on right leg will be 100% granulated to demonstrate improved wound healing.    Time 4    Period Weeks    Status Achieved    Target Date 01/31/21      PT SHORT TERM GOAL #4   Title Patient will report importance of wearing daily compression garments to reduce swelling and risk of forming blisters    Time 4    Period Weeks    Status Achieved    Target Date 02/28/21             PT Long Term Goals - 02/20/21 1321      PT LONG TERM GOAL #1   Time 8    Period Weeks    Status New      PT LONG TERM GOAL #2   Title Anterior lower wound on right leg will be completely healed to reduce risk of infection.    Time 8    Period Weeks    Status On-going      PT LONG TERM GOAL #3   Title Posterior wound on right leg will be completely healed to reduce risk of infection.    Time 8    Period Weeks    Status On-going      PT LONG TERM GOAL #4   Title Patietn will report <3/10 pain in legs due to wounds to reduce pain with walking    Baseline continued pain with debridement    Time 8    Period Weeks    Status On-going                  Plan - 03/13/21 1239    Clinical Impression Statement see above    Personal Factors and Comorbidities Comorbidity 1;Comorbidity 2;Comorbidity 3+    Comorbidities pacemaker, afib, on anticoagulents, vaicose veins.    Examination-Activity Limitations Locomotion Level;Hygiene/Grooming;Stand    Examination-Participation Restrictions Community Activity    Stability/Clinical Decision Making Stable/Uncomplicated    PT Frequency 2x / week    PT Duration 4 weeks    PT Treatment/Interventions ADLs/Self Care Home Management;Manual techniques;Other (comment)   wound care, modalities   PT Next Visit Plan continue with debridement and dressing changes - measure wounds    PT Home Exercise Plan ankle pumps    Consulted and Agree with Plan of Care Patient           Patient will benefit from skilled therapeutic intervention in order to improve the following deficits and impairments:  Other (comment),Pain,Increased edema,Decreased skin integrity,Decreased mobility,Decreased activity tolerance (wounds)  Visit Diagnosis: Leg wound, right, initial encounter  Pain in right leg  Difficulty in walking, not elsewhere classified     Problem List Patient Active Problem List   Diagnosis Date Noted  . Cellulitis and abscess of right lower extremity 12/24/2020  . Diabetes mellitus without complication ([place])   . SSS (sick sinus syndrome) ([place]) 07/27/2014  . Pacemaker syndrome 07/27/2014  . Tachy-brady syndrome ([place]) 07/27/2014  . Difficulty in walking(719.7) 11/03/2013  . Stiffness of right knee 11/03/2013  .  Postoperative anemia due to acute blood loss 10/06/2013  . OA (osteoarthritis) of knee 10/05/2013  . Preoperative cardiovascular examination 07/20/2013  . Chronic diastolic heart failure ([place]) 04/29/2013  . Long QT interval 04/21/2013  . Volume overload 04/21/2013  . Long term current use of anticoagulant therapy 01/09/2013  . HTN (hypertension) 05/20/2011  . Pleural effusion  05/20/2011  . Lactic acid acidosis 05/18/2011  . Pacemaker 05/18/2011  . Anemia 05/18/2011  . Acute renal failure (ARF) ([place]) 05/18/2011  . Pneumonia 05/17/2011  . Fever 05/17/2011  . Atrial fibrillation ([place]) 05/17/2011  . Hyperglycemia 05/17/2011  . Bradycardia 05/17/2011  . Gout 05/17/2011  . Arthritis 05/17/2011  . Thrombocytopenia [facility]) 05/17/2011    Rayetta Humphrey, PT CLT [phone] 03/13/2021, 12:41 PM  [place] [address] [place], [place], [address] Phone: [phone]   Fax:  [phone]  Name: JAHLIL ZILLER MRN: [id number] Date of Birth: [birth year]

## 2021-03-15 ENCOUNTER — Ambulatory Visit (HOSPITAL_COMMUNITY): Payer: Medicare Other | Admitting: Physical Therapy

## 2021-03-16 ENCOUNTER — Encounter (HOSPITAL_COMMUNITY): Payer: Self-pay | Admitting: Physical Therapy

## 2021-03-16 ENCOUNTER — Ambulatory Visit (HOSPITAL_COMMUNITY): Payer: Medicare Other | Admitting: Physical Therapy

## 2021-03-16 ENCOUNTER — Other Ambulatory Visit: Payer: Self-pay

## 2021-03-16 DIAGNOSIS — S81801A Unspecified open wound, right lower leg, initial encounter: Secondary | ICD-10-CM

## 2021-03-16 DIAGNOSIS — M79604 Pain in right leg: Secondary | ICD-10-CM

## 2021-03-16 DIAGNOSIS — S81802A Unspecified open wound, left lower leg, initial encounter: Secondary | ICD-10-CM

## 2021-03-16 DIAGNOSIS — R262 Difficulty in walking, not elsewhere classified: Secondary | ICD-10-CM

## 2021-03-16 NOTE — Therapy (Signed)
[place], [place], [address] Phone: [phone]   Fax:  [phone]  Wound Care Therapy, Progress Note and RECERT  Patient Details  Name: [name] MRN: [id number] Date of Birth: 19-Oct-1934 Referring Provider (PT): Jeanella Craze, DO Progress Note Reporting Periodto 02/20/21 to 03/16/21  See note below for Objective Data and Assessment of Progress/Goals.        Encounter Date: 03/16/2021   PT End of Session - 03/16/21 1045    Visit Number 28    Number of Visits 40    Date for PT Re-Evaluation 04/13/21    Authorization Type UHC medicare, no auth or VL    Progress Note Due on Visit 66    PT Start Time 1001    PT Stop Time 1040    PT Time Calculation (min) 39 min    Activity Tolerance Patient limited by pain    Behavior During Therapy WFL for tasks assessed/performed           Past Medical History:  Diagnosis Date  . Arthritis   . Atrial fibrillation ([place])   . Barrett's esophagus   . CAD (coronary artery disease)    echo 09-05-2010-EF nl, mod-severe dilated Left atrium; myoview 02-08-12-EF 52%, no ischemia  . Cancer [facility])    prostate - radiation only  . Diabetes mellitus without complication ([place])    "prediabetic"-no oral meds  . GERD (gastroesophageal reflux disease)   . Gout   . H/O transesophageal echocardiography (TEE) for monitoring 09/05/10   tee guided AT pace termination, did not proceed with cardioversion due to termination of the atrial flutter through his pacemaker  . History of cardioversion 02/09/13   electrical synchronized cardioversion, on flecainide and warfarin  . History of kidney stones    per pt - no actual diagnosisi  . History of stomach ulcers    many yrs ago  . HTN (hypertension)   . Pacemaker    medtronic  . Pacemaker 09-25-13  . Prostate cancer ([place]) 09-25-13   tx. with radiation only- dx. 2013    Past Surgical History:  Procedure Laterality Date  .  APPENDECTOMY    . CARDIAC CATHETERIZATION  02/18/06   EF 65%, focal napkin ring-like lesion into the prox LAD otherwise normal coronaries  . CARDIOVERSION N/A 02/09/2013   Procedure: CARDIOVERSION;  Surgeon: Sanda Klein, MD;  Location: [facility];  Service: Cardiovascular;  Laterality: N/A;  . CATARACT EXTRACTION W/PHACO Left 12/23/2020   Procedure: CATARACT EXTRACTION PHACO AND INTRAOCULAR LENS PLACEMENT ([place]);  Surgeon: [name], MD;  Location[facility];  Service: Ophthalmology;  Laterality: Left;  CDE 18.55  . CATARACT EXTRACTION W/PHACO Right 01/09/2021   Procedure: CATARACT EXTRACTION PHACO AND INTRAOCULAR LENS PLACEMENT (I[facility]);  Surgeon: [name], MD;  [facility];  Service: Ophthalmology;  Laterality: Right;  CDE: 28.65  . PACEMAKER GENERATOR CHANGE N/A 07/27/2014   Procedure: PACEMAKER GENERATOR CHANGE;  Surgeon: Sanda Klein, MD;  Location: [facility] CATH LAB;  Service: Cardiovascular;  Laterality: N/A;  . PACEMAKER INSERTION     medtronic adapta- Dr. Sallyanne Kuster follows- LOV 9'14  . TONSILLECTOMY    . TOTAL KNEE ARTHROPLASTY Right 10/05/2013   Procedure: RIGHT TOTAL KNEE ARTHROPLASTY;  Surgeon: Gearlean Alf, MD;  [facility];  Service: Orthopedics;  Laterality: Right;    There were no vitals filed for this visit.      [facility] PT Assessment - 03/16/21 0001      Assessment  Medical Diagnosis wounds on right leg    Referring Provider (PT) Pratick Manuella Ghazi, DO                   Wound Therapy - 03/16/21 0001    Subjective States it is feeling pretty good.    Patient and Family Stated Goals wounds to heal    Prior Treatments neosporin, gauze, antibitics    Evaluation and Treatment Procedures Explained to Patient/Family Yes    Evaluation and Treatment Procedures agreed to    Wound Properties Date First Assessed: 01/03/21 Wound Type: Diabetic ulcer Location: Tibial Location Orientation: Distal;Posterior;Right Wound Description (Comments): right posterior wound  Present on Admission: Yes   Dressing Type Compression wrap    Dressing Changed Changed    Dressing Status Old drainage    Dressing Change Frequency PRN    Site / Wound Assessment Granulation tissue    % Wound base Red or Granulating 100%    Peri-wound Assessment Erythema (blanchable)    Wound Length (cm) 0.9 cm    Wound Width (cm) 1.1 cm    Wound Depth (cm) 0 cm    Wound Volume (cm^3) 0 cm^3    Wound Surface Area (cm^2) 0.99 cm^2    Margins Attached edges (approximated)    Drainage Amount Minimal    Drainage Description Serosanguineous    Treatment Cleansed    Wound Properties Date First Assessed: 01/03/21 Time First Assessed: 1430 Wound Type: Diabetic ulcer Location: Pretibial Location Orientation: Distal;Right Wound Description (Comments): right lower wound Present on Admission: Yes   Dressing Type Compression wrap;Impregnated gauze (bismuth)    Dressing Changed Changed    Dressing Status Old drainage    Dressing Change Frequency PRN    Site / Wound Assessment Red;Friable;Bleeding    % Wound base Red or Granulating 95%    % Wound base Yellow/Fibrinous Exudate 5%    Peri-wound Assessment Intact;Erythema (blanchable)    Wound Length (cm) 3.4 cm    Wound Width (cm) 2.2 cm    Wound Depth (cm) 0 cm    Wound Volume (cm^3) 0 cm^3    Wound Surface Area (cm^2) 7.48 cm^2    Margins Epibole (rolled edges)    Drainage Amount Minimal    Drainage Description Serosanguineous    Treatment Debridement (Selective);Cleansed    Selective Debridement - Location anteiror lateral wound on right leg    Selective Debridement - Tools Used Forceps    Selective Debridement - Tissue Removed biofilm    Wound Therapy - Clinical Statement Patient progressing well with both wounds on right leg approximating. Hyper-granulation tissue noted in both wounds so  inch foam was applied. Performed manual massage surrounding posterior wound secondary to induration noted that may be limiting approximation. Left leg  profore changed on this date, continued with xeroform and profore on blisters. Only 4 small blisters noted compared to previous 3. Extending POC to continue to promote optimal wound healing.    Wound Therapy - Functional Problem List difficulty with dressing    Factors Delaying/Impairing Wound Healing Vascular compromise;Infection - systemic/local;Diabetes Mellitus   recent infection, heart disease   Wound Therapy - Frequency 2X / week    Wound Therapy - Current Recommendations PT    Wound Plan continue with debridement and appropriate dressing change.  Take photos and measure on MONDAYS.    Dressing  xeroform, 2x2, 1/4 " foam  on BOTH anterior and posterior wounds, and proform lite    Dressing left leg xerofrom and profroe lite  PT Short Term Goals - 02/20/21 1320      PT SHORT TERM GOAL #1   Title Wound on left leg will be completely healed to reduce risk of infection.    Time 4    Period Weeks    Status Achieved    Target Date 01/31/21      PT SHORT TERM GOAL #2   Title Anterior lower wound on right leg will be 100% granulated to deomstrate improved wound healing.    Time 4    Period Weeks    Status On-going    Target Date 01/31/21      PT SHORT TERM GOAL #3   Title Posterior  wound on right leg will be 100% granulated to demonstrate improved wound healing.    Time 4    Period Weeks    Status Achieved    Target Date 01/31/21      PT SHORT TERM GOAL #4   Title Patient will report importance of wearing daily compression garments to reduce swelling and risk of forming blisters    Time 4    Period Weeks    Status Achieved    Target Date 02/28/21             PT Long Term Goals - 02/20/21 1321      PT LONG TERM GOAL #1   Time 8    Period Weeks    Status New      PT LONG TERM GOAL #2   Title Anterior lower wound on right leg will be completely healed to reduce risk of infection.    Time 8    Period Weeks    Status On-going      PT  LONG TERM GOAL #3   Title Posterior wound on right leg will be completely healed to reduce risk of infection.    Time 8    Period Weeks    Status On-going      PT LONG TERM GOAL #4   Title Patietn will report <3/10 pain in legs due to wounds to reduce pain with walking    Baseline continued pain with debridement    Time 8    Period Weeks    Status On-going                 Plan - 03/16/21 1046    Clinical Impression Statement see above    Personal Factors and Comorbidities Comorbidity 1;Comorbidity 2;Comorbidity 3+    Comorbidities pacemaker, afib, on anticoagulents, vaicose veins.    Examination-Activity Limitations Locomotion Level;Hygiene/Grooming;Stand    Examination-Participation Restrictions Community Activity    Stability/Clinical Decision Making Stable/Uncomplicated    PT Frequency 2x / week    PT Duration 4 weeks    PT Treatment/Interventions ADLs/Self Care Home Management;Manual techniques;Other (comment)   wound care, modalities   PT Next Visit Plan continue with debridement and dressing changes - measure wounds    PT Home Exercise Plan ankle pumps    Consulted and Agree with Plan of Care Patient           Patient will benefit from skilled therapeutic intervention in order to improve the following deficits and impairments:  Other (comment),Pain,Increased edema,Decreased skin integrity,Decreased mobility,Decreased activity tolerance (wounds)  Visit Diagnosis: Leg wound, right, initial encounter  Pain in right leg  Difficulty in walking, not elsewhere classified  Open leg wound, left, initial encounter     Problem List Patient Active Problem List   Diagnosis Date Noted  . Cellulitis  and abscess of right lower extremity 12/24/2020  . Diabetes mellitus without complication ([place])   . SSS (sick sinus syndrome) ([place]) 07/27/2014  . Pacemaker syndrome 07/27/2014  . Tachy-brady syndrome ([place]) 07/27/2014  . Difficulty in walking(719.7) 11/03/2013  .  Stiffness of right knee 11/03/2013  . Postoperative anemia due to acute blood loss 10/06/2013  . OA (osteoarthritis) of knee 10/05/2013  . Preoperative cardiovascular examination 07/20/2013  . Chronic diastolic heart failure ([place]) 04/29/2013  . Long QT interval 04/21/2013  . Volume overload 04/21/2013  . Long term current use of anticoagulant therapy 01/09/2013  . HTN (hypertension) 05/20/2011  . Pleural effusion 05/20/2011  . Lactic acid acidosis 05/18/2011  . Pacemaker 05/18/2011  . Anemia 05/18/2011  . Acute renal failure (ARF) ([place]) 05/18/2011  . Pneumonia 05/17/2011  . Fever 05/17/2011  . Atrial fibrillation ([place]) 05/17/2011  . Hyperglycemia 05/17/2011  . Bradycardia 05/17/2011  . Gout 05/17/2011  . Arthritis 05/17/2011  . Thrombocytopenia ([place]) 05/17/2011   10:53 AM, 03/16/21 Jerene Pitch, DPT Physical Therapy with [facility]  [phone] office  [place] [address] [place], [place], [address] Phone: [phone]   Fax:  [phone]  Name: [name] MRN: [id number] Date of Birth: 11/21/1933

## 2021-03-17 ENCOUNTER — Ambulatory Visit (HOSPITAL_COMMUNITY): Payer: Medicare Other | Admitting: Physical Therapy

## 2021-03-20 ENCOUNTER — Ambulatory Visit (HOSPITAL_COMMUNITY): Payer: Medicare Other | Admitting: Physical Therapy

## 2021-03-20 ENCOUNTER — Encounter (HOSPITAL_COMMUNITY): Payer: Self-pay | Admitting: Physical Therapy

## 2021-03-20 ENCOUNTER — Other Ambulatory Visit: Payer: Self-pay

## 2021-03-20 DIAGNOSIS — S81802A Unspecified open wound, left lower leg, initial encounter: Secondary | ICD-10-CM

## 2021-03-20 DIAGNOSIS — M79604 Pain in right leg: Secondary | ICD-10-CM

## 2021-03-20 DIAGNOSIS — S81801A Unspecified open wound, right lower leg, initial encounter: Secondary | ICD-10-CM

## 2021-03-20 DIAGNOSIS — R262 Difficulty in walking, not elsewhere classified: Secondary | ICD-10-CM

## 2021-03-20 NOTE — Therapy (Signed)
[place], [place], [address] Phone: [phone]   Fax:  [phone]  Wound Care Therapy  Patient Details  Name: [name] MRN: [id number] Date of Birth: 05-12-1934 Referring Provider (PT): Jeanella Craze, DO   Encounter Date: 03/20/2021   PT End of Session - 03/20/21 1130    Visit Number 29    Number of Visits 40    Date for PT Re-Evaluation 04/13/21    Authorization Type UHC medicare, no auth or VL    Progress Note Due on Visit 53    PT Start Time 1132    PT Stop Time 1210    PT Time Calculation (min) 38 min    Activity Tolerance Patient limited by pain    Behavior During Therapy [facility] for tasks assessed/performed           Past Medical History:  Diagnosis Date  . Arthritis   . Atrial fibrillation ([place])   . Barrett's esophagus   . CAD (coronary artery disease)    echo 09-05-2010-EF nl, mod-severe dilated Left atrium; myoview 02-08-12-EF 52%, no ischemia  . Cancer [facility])    prostate - radiation only  . Diabetes mellitus without complication ([place])    "prediabetic"-no oral meds  . GERD (gastroesophageal reflux disease)   . Gout   . H/O transesophageal echocardiography (TEE) for monitoring 09/05/10   tee guided AT pace termination, did not proceed with cardioversion due to termination of the atrial flutter through his pacemaker  . History of cardioversion 02/09/13   electrical synchronized cardioversion, on flecainide and warfarin  . History of kidney stones    per pt - no actual diagnosisi  . History of stomach ulcers    many yrs ago  . HTN (hypertension)   . Pacemaker    medtronic  . Pacemaker 09-25-13  . Prostate cancer ([place]) 09-25-13   tx. with radiation only- dx. 2013    Past Surgical History:  Procedure Laterality Date  . APPENDECTOMY    . CARDIAC CATHETERIZATION  02/18/06   EF 65%, focal napkin ring-like lesion into the prox LAD otherwise normal coronaries  . CARDIOVERSION N/A 02/09/2013    Procedure: CARDIOVERSION;  Surgeon: Sanda Klein, MD;  Location: [place] [facility];  Service: Cardiovascular;  Laterality: N/A;  . CATARACT EXTRACTION W/PHACO Left 12/23/2020   Procedure: CATARACT EXTRACTION PHACO AND INTRAOCULAR LENS PLACEMENT ([place]);  Surgeon: [name], MD;  [facility];  Service: Ophthalmology;  Laterality: Left;  CDE 18.55  . CATARACT EXTRACTION W/PHACO Right 01/09/2021   Procedure: CATARACT EXTRACTION PHACO AND INTRAOCULAR LENS PLACEMENT ([facility]);  Surgeon: [name], MD;  Location: [facility];  Service: Ophthalmology;  Laterality: Right;  CDE: 28.65  . PACEMAKER GENERATOR CHANGE N/A 07/27/2014   Procedure: PACEMAKER GENERATOR CHANGE;  Surgeon: Sanda Klein, MD;  Location: [place] [facility];  Service: Cardiovascular;  Laterality: N/A;  . PACEMAKER INSERTION     medtronic adapta- Dr. Sallyanne Kuster follows- LOV 9'14  . TONSILLECTOMY    . TOTAL KNEE ARTHROPLASTY Right 10/05/2013   Procedure: RIGHT TOTAL KNEE ARTHROPLASTY;  Surgeon: Gearlean Alf, MD;  Location[facility];  Service: Orthopedics;  Laterality: Right;    There were no vitals filed for this visit.      [facility] PT Assessment - 03/20/21 0001      Assessment   Medical Diagnosis wounds on right leg    Referring Provider (PT) Pratick Manuella Ghazi, DO  Wound Therapy - 03/20/21 0001    Subjective States he has been driving his car a little more instead of his truck and has been bending his knee a bit more which may have caused the bandage to slide down a bit    Patient and Family Stated Goals wounds to heal    Prior Treatments neosporin, gauze, antibitics    Evaluation and Treatment Procedures Explained to Patient/Family Yes    Evaluation and Treatment Procedures agreed to    Wound Properties Date First Assessed: 01/03/21 Time First Assessed: 1430 Wound Type: Diabetic ulcer Location: Pretibial Location Orientation: Distal;Right Wound Description (Comments): right lower wound Present on Admission: Yes    Dressing Type Compression wrap;Impregnated gauze (bismuth)    Dressing Changed Changed    Dressing Status Old drainage    Dressing Change Frequency PRN    Site / Wound Assessment Bleeding;Granulation tissue;Painful;Red    % Wound base Red or Granulating 95%    % Wound base Yellow/Fibrinous Exudate 5%    Peri-wound Assessment Intact;Erythema (blanchable)   dry skin   Margins Unattached edges (unapproximated)    Drainage Amount Minimal    Drainage Description Serosanguineous    Treatment Cleansed;Debridement (Selective)    Wound Properties Date First Assessed: 01/03/21 Wound Type: Diabetic ulcer Location: Tibial Location Orientation: Distal;Posterior;Right Wound Description (Comments): right posterior wound Present on Admission: Yes   Dressing Type Compression wrap;Impregnated gauze (bismuth)    Dressing Changed Changed    Dressing Status Old drainage    Dressing Change Frequency PRN    Site / Wound Assessment Granulation tissue    % Wound base Red or Granulating 100%    Margins Attached edges (approximated)    Drainage Amount Minimal    Drainage Description Serosanguineous    Treatment Cleansed    Selective Debridement - Location anteiror lateral wound on right leg    Selective Debridement - Tools Used Forceps;Scalpel    Selective Debridement - Tissue Removed biofilm    Wound Therapy - Clinical Statement Wound continues to approximate. Will transition to 1x/week over the next week. Slough removed from anterior wound. Continued with xeroform and profore lite and 1/4 inch foam. With transition to 1x/week will measure and take photos each session and change dressings on both legs.    Wound Therapy - Functional Problem List difficulty with dressing    Factors Delaying/Impairing Wound Healing Vascular compromise;Infection - systemic/local;Diabetes Mellitus   recent infection, heart disease   Wound Therapy - Frequency 2X / week    Wound Therapy - Current Recommendations PT    Wound Plan  continue with debridement and appropriate dressing change.  Take photos and measure each session as patient is 1x/week . CHANGE both leg dressings.    Dressing  xeroform, 2x2, 1/4 " foam  on BOTH anterior and posterior wounds, and proform lite    Dressing left leg xeroform and profore lite                     PT Short Term Goals - 02/20/21 1320      PT SHORT TERM GOAL #1   Title Wound on left leg will be completely healed to reduce risk of infection.    Time 4    Period Weeks    Status Achieved    Target Date 01/31/21      PT SHORT TERM GOAL #2   Title Anterior lower wound on right leg will be 100% granulated to deomstrate improved wound healing.  Time 4    Period Weeks    Status On-going    Target Date 01/31/21      PT SHORT TERM GOAL #3   Title Posterior  wound on right leg will be 100% granulated to demonstrate improved wound healing.    Time 4    Period Weeks    Status Achieved    Target Date 01/31/21      PT SHORT TERM GOAL #4   Title Patient will report importance of wearing daily compression garments to reduce swelling and risk of forming blisters    Time 4    Period Weeks    Status Achieved    Target Date 02/28/21             PT Long Term Goals - 02/20/21 1321      PT LONG TERM GOAL #1   Time 8    Period Weeks    Status New      PT LONG TERM GOAL #2   Title Anterior lower wound on right leg will be completely healed to reduce risk of infection.    Time 8    Period Weeks    Status On-going      PT LONG TERM GOAL #3   Title Posterior wound on right leg will be completely healed to reduce risk of infection.    Time 8    Period Weeks    Status On-going      PT LONG TERM GOAL #4   Title Patietn will report <3/10 pain in legs due to wounds to reduce pain with walking    Baseline continued pain with debridement    Time 8    Period Weeks    Status On-going                 Plan - 03/20/21 1131    Clinical Impression Statement  see above    Personal Factors and Comorbidities Comorbidity 1;Comorbidity 2;Comorbidity 3+    Comorbidities pacemaker, afib, on anticoagulents, vaicose veins.    Examination-Activity Limitations Locomotion Level;Hygiene/Grooming;Stand    Examination-Participation Restrictions Community Activity    Stability/Clinical Decision Making Stable/Uncomplicated    PT Frequency 2x / week    PT Duration 4 weeks    PT Treatment/Interventions ADLs/Self Care Home Management;Manual techniques;Other (comment)   wound care, modalities   PT Next Visit Plan continue with debridement and dressing changes - measure wounds    PT Home Exercise Plan ankle pumps    Consulted and Agree with Plan of Care Patient           Patient will benefit from skilled therapeutic intervention in order to improve the following deficits and impairments:  Other (comment),Pain,Increased edema,Decreased skin integrity,Decreased mobility,Decreased activity tolerance (wounds)  Visit Diagnosis: Leg wound, right, initial encounter  Pain in right leg  Difficulty in walking, not elsewhere classified  Open leg wound, left, initial encounter     Problem List Patient Active Problem List   Diagnosis Date Noted  . Cellulitis and abscess of right lower extremity 12/24/2020  . Diabetes mellitus without complication ([place])   . SSS (sick sinus syndrome) ([place]) 07/27/2014  . Pacemaker syndrome 07/27/2014  . Tachy-brady syndrome ([place]) 07/27/2014  . Difficulty in walking(719.7) 11/03/2013  . Stiffness of right knee 11/03/2013  . Postoperative anemia due to acute blood loss 10/06/2013  . OA (osteoarthritis) of knee 10/05/2013  . Preoperative cardiovascular examination 07/20/2013  . Chronic diastolic heart failure ([place]) 04/29/2013  . Long QT interval 04/21/2013  .  Volume overload 04/21/2013  . Long term current use of anticoagulant therapy 01/09/2013  . HTN (hypertension) 05/20/2011  . Pleural effusion 05/20/2011  . Lactic acid  acidosis 05/18/2011  . Pacemaker 05/18/2011  . Anemia 05/18/2011  . Acute renal failure (ARF) ([place]) 05/18/2011  . Pneumonia 05/17/2011  . Fever 05/17/2011  . Atrial fibrillation ([place]) 05/17/2011  . Hyperglycemia 05/17/2011  . Bradycardia 05/17/2011  . Gout 05/17/2011  . Arthritis 05/17/2011  . Thrombocytopenia ([place]) 05/17/2011   1:02 PM, 03/20/21 [name], DPT Physical Therapy with [facility]  [phone] office  [place] [address] [place], [place], [address] Phone: [phone]   Fax:  [phone]  Name: [name] MRN: [id number] Date of Birth: 02-19-1934

## 2021-03-22 ENCOUNTER — Ambulatory Visit (HOSPITAL_COMMUNITY): Payer: Medicare Other | Admitting: Physical Therapy

## 2021-03-23 ENCOUNTER — Ambulatory Visit (HOSPITAL_COMMUNITY): Payer: Medicare Other | Admitting: Physical Therapy

## 2021-03-23 ENCOUNTER — Encounter (HOSPITAL_COMMUNITY): Payer: Self-pay | Admitting: Physical Therapy

## 2021-03-23 ENCOUNTER — Other Ambulatory Visit: Payer: Self-pay

## 2021-03-23 DIAGNOSIS — S81802A Unspecified open wound, left lower leg, initial encounter: Secondary | ICD-10-CM

## 2021-03-23 DIAGNOSIS — M79604 Pain in right leg: Secondary | ICD-10-CM

## 2021-03-23 DIAGNOSIS — R262 Difficulty in walking, not elsewhere classified: Secondary | ICD-10-CM

## 2021-03-23 DIAGNOSIS — S81801A Unspecified open wound, right lower leg, initial encounter: Secondary | ICD-10-CM | POA: Diagnosis not present

## 2021-03-23 NOTE — Therapy (Signed)
[place], [place], [address] Phone: [phone]   Fax:  [phone]  Wound Care Therapy  Patient Details  Name: [name] MRN: [id number] Date of Birth: 02-14-1934 Referring Provider (PT): Jeanella Craze, DO   Encounter Date: 03/23/2021   PT End of Session - 03/23/21 1041    Visit Number 30    Number of Visits 40    Date for PT Re-Evaluation 04/13/21    Authorization Type UHC medicare, no auth or VL    Progress Note Due on Visit 38    PT Start Time 1000    PT Stop Time 1035    PT Time Calculation (min) 35 min    Activity Tolerance Patient limited by pain    Behavior During Therapy [facility] for tasks assessed/performed           Past Medical History:  Diagnosis Date  . Arthritis   . Atrial fibrillation ([place])   . Barrett's esophagus   . CAD (coronary artery disease)    echo 09-05-2010-EF nl, mod-severe dilated Left atrium; myoview 02-08-12-EF 52%, no ischemia  . Cancer [facility])    prostate - radiation only  . Diabetes mellitus without complication ([place])    "prediabetic"-no oral meds  . GERD (gastroesophageal reflux disease)   . Gout   . H/O transesophageal echocardiography (TEE) for monitoring 09/05/10   tee guided AT pace termination, did not proceed with cardioversion due to termination of the atrial flutter through his pacemaker  . History of cardioversion 02/09/13   electrical synchronized cardioversion, on flecainide and warfarin  . History of kidney stones    per pt - no actual diagnosisi  . History of stomach ulcers    many yrs ago  . HTN (hypertension)   . Pacemaker    medtronic  . Pacemaker 09-25-13  . Prostate cancer ([place]) 09-25-13   tx. with radiation only- dx. 2013    Past Surgical History:  Procedure Laterality Date  . APPENDECTOMY    . CARDIAC CATHETERIZATION  02/18/06   EF 65%, focal napkin ring-like lesion into the prox LAD otherwise normal coronaries  . CARDIOVERSION N/A 02/09/2013    Procedure: CARDIOVERSION;  Surgeon: Sanda Klein, MD;  Location: [place] [facility];  Service: Cardiovascular;  Laterality: N/A;  . CATARACT EXTRACTION W/PHACO Left 12/23/2020   Procedure: CATARACT EXTRACTION PHACO AND INTRAOCULAR LENS PLACEMENT ([place]);  Surgeon: [name], MD;  [facility];  Service: Ophthalmology;  Laterality: Left;  CDE 18.55  . CATARACT EXTRACTION W/PHACO Right 01/09/2021   Procedure: CATARACT EXTRACTION PHACO AND INTRAOCULAR LENS PLACEMENT ([facility]);  Surgeon: [name], MD;  [facility];  Service: Ophthalmology;  Laterality: Right;  CDE: 28.65  . PACEMAKER GENERATOR CHANGE N/A 07/27/2014   Procedure: PACEMAKER GENERATOR CHANGE;  Surgeon: Sanda Klein, MD;  Location: [place] CATH LAB;  Service: Cardiovascular;  Laterality: N/A;  . PACEMAKER INSERTION     medtronic adapta- Dr. Sallyanne Kuster follows- LOV 9'14  . TONSILLECTOMY    . TOTAL KNEE ARTHROPLASTY Right 10/05/2013   Procedure: RIGHT TOTAL KNEE ARTHROPLASTY;  Surgeon: Gearlean Alf, MD;  [facility];  Service: Orthopedics;  Laterality: Right;    There were no vitals filed for this visit.      [facility] PT Assessment - 03/23/21 0001      Assessment   Medical Diagnosis wounds on right leg    Referring Provider (PT) Pratick Manuella Ghazi, DO  Wound Therapy - 03/23/21 0001    Subjective Reports no pain, bandages in place. States he ordered zippered compression garments that will hopefully be here soon    Patient and Family Stated Goals wounds to heal    Prior Treatments neosporin, gauze, antibitics    Evaluation and Treatment Procedures Explained to Patient/Family Yes    Evaluation and Treatment Procedures agreed to    Wound Properties Date First Assessed: 01/03/21 Time First Assessed: 1430 Wound Type: Diabetic ulcer Location: Pretibial Location Orientation: Distal;Right Wound Description (Comments): right lower wound Present on Admission: Yes   Dressing Type Compression wrap;Impregnated  gauze (bismuth)    Dressing Changed Changed    Dressing Status Old drainage    Dressing Change Frequency PRN    Site / Wound Assessment Bleeding;Granulation tissue    % Wound base Red or Granulating 100%    % Wound base Yellow/Fibrinous Exudate 0%    Peri-wound Assessment Intact;Erythema (blanchable)    Wound Length (cm) 2.9 cm   was 3.4   Wound Width (cm) 1.9 cm   was 2.2   Wound Depth (cm) 0 cm    Wound Volume (cm^3) 0 cm^3    Wound Surface Area (cm^2) 5.51 cm^2    Margins Attached edges (approximated)    Drainage Amount Minimal    Drainage Description Serosanguineous    Treatment Cleansed;Debridement (Selective)    Wound Properties Date First Assessed: 01/03/21 Wound Type: Diabetic ulcer Location: Tibial Location Orientation: Distal;Posterior;Right Wound Description (Comments): right posterior wound Present on Admission: Yes   Dressing Type Compression wrap;Impregnated gauze (bismuth)    Dressing Changed Changed    Dressing Status Old drainage    Dressing Change Frequency PRN    % Wound base Red or Granulating 100%    Peri-wound Assessment Edema;Erythema (blanchable);Intact    Wound Length (cm) 0.4 cm   was .9   Wound Width (cm) 0.6 cm   was 1.1   Wound Surface Area (cm^2) 0.24 cm^2    Margins Attached edges (approximated)    Drainage Amount Minimal    Drainage Description Serosanguineous    Treatment Cleansed    Selective Debridement - Location anterior lateral wound on right leg    Selective Debridement - Tools Used Scalpel    Selective Debridement - Tissue Removed biofilm, slough    Wound Therapy - Clinical Statement Wounds on right leg continue to approximate and improve in granulation tissue. Noted that there is one small blister on the anterior right shin. All blisters on left leg gone. Continued to wrap left leg with profore lite until zippered compression garments arrive. Contineud with xeroform and 1/4 innch foam on wounds on right leg followed by profore lite.  Decreasing to 1x/week secondary to improved granulation and size of wounds. Will continue with current POC.    Wound Therapy - Functional Problem List difficulty with dressing    Factors Delaying/Impairing Wound Healing Vascular compromise;Infection - systemic/local;Diabetes Mellitus   recent infection, heart disease   Wound Therapy - Frequency Other (comment)   1x/week   Wound Therapy - Current Recommendations PT    Wound Plan continue with debridement and appropriate dressing change.  Take photos and measure each session as patient is 1x/week . CHANGE both leg dressings.    Dressing  xeroform, 2x2, 1/4 " foam  on BOTH anterior and posterior wounds, and proform lite    Dressing left leg profore lite  PT Short Term Goals - 02/20/21 1320      PT SHORT TERM GOAL #1   Title Wound on left leg will be completely healed to reduce risk of infection.    Time 4    Period Weeks    Status Achieved    Target Date 01/31/21      PT SHORT TERM GOAL #2   Title Anterior lower wound on right leg will be 100% granulated to deomstrate improved wound healing.    Time 4    Period Weeks    Status On-going    Target Date 01/31/21      PT SHORT TERM GOAL #3   Title Posterior  wound on right leg will be 100% granulated to demonstrate improved wound healing.    Time 4    Period Weeks    Status Achieved    Target Date 01/31/21      PT SHORT TERM GOAL #4   Title Patient will report importance of wearing daily compression garments to reduce swelling and risk of forming blisters    Time 4    Period Weeks    Status Achieved    Target Date 02/28/21             PT Long Term Goals - 02/20/21 1321      PT LONG TERM GOAL #1   Time 8    Period Weeks    Status New      PT LONG TERM GOAL #2   Title Anterior lower wound on right leg will be completely healed to reduce risk of infection.    Time 8    Period Weeks    Status On-going      PT LONG TERM GOAL #3   Title  Posterior wound on right leg will be completely healed to reduce risk of infection.    Time 8    Period Weeks    Status On-going      PT LONG TERM GOAL #4   Title Patietn will report <3/10 pain in legs due to wounds to reduce pain with walking    Baseline continued pain with debridement    Time 8    Period Weeks    Status On-going                 Plan - 03/23/21 1041    Clinical Impression Statement see above    Personal Factors and Comorbidities Comorbidity 1;Comorbidity 2;Comorbidity 3+    Comorbidities pacemaker, afib, on anticoagulents, vaicose veins.    Examination-Activity Limitations Locomotion Level;Hygiene/Grooming;Stand    Examination-Participation Restrictions Community Activity    Stability/Clinical Decision Making Stable/Uncomplicated    PT Frequency 2x / week    PT Duration 4 weeks    PT Treatment/Interventions ADLs/Self Care Home Management;Manual techniques;Other (comment)   wound care, modalities   PT Next Visit Plan continue with debridement and dressing changes - measure wounds    PT Home Exercise Plan ankle pumps    Consulted and Agree with Plan of Care Patient           Patient will benefit from skilled therapeutic intervention in order to improve the following deficits and impairments:  Other (comment),Pain,Increased edema,Decreased skin integrity,Decreased mobility,Decreased activity tolerance (wounds)  Visit Diagnosis: Leg wound, right, initial encounter  Pain in right leg  Difficulty in walking, not elsewhere classified  Open leg wound, left, initial encounter     Problem List Patient Active Problem List   Diagnosis Date Noted  . Cellulitis  and abscess of right lower extremity 12/24/2020  . Diabetes mellitus without complication ([place])   . SSS (sick sinus syndrome) ([place]) 07/27/2014  . Pacemaker syndrome 07/27/2014  . Tachy-brady syndrome ([place]) 07/27/2014  . Difficulty in walking(719.7) 11/03/2013  . Stiffness of right knee  11/03/2013  . Postoperative anemia due to acute blood loss 10/06/2013  . OA (osteoarthritis) of knee 10/05/2013  . Preoperative cardiovascular examination 07/20/2013  . Chronic diastolic heart failure ([place]) 04/29/2013  . Long QT interval 04/21/2013  . Volume overload 04/21/2013  . Long term current use of anticoagulant therapy 01/09/2013  . HTN (hypertension) 05/20/2011  . Pleural effusion 05/20/2011  . Lactic acid acidosis 05/18/2011  . Pacemaker 05/18/2011  . Anemia 05/18/2011  . Acute renal failure (ARF) ([place]) 05/18/2011  . Pneumonia 05/17/2011  . Fever 05/17/2011  . Atrial fibrillation ([place]) 05/17/2011  . Hyperglycemia 05/17/2011  . Bradycardia 05/17/2011  . Gout 05/17/2011  . Arthritis 05/17/2011  . Thrombocytopenia ([place]) 05/17/2011   10:42 AM, 03/23/21 Jerene Pitch, DPT Physical Therapy with [facility]  [phone] office  [place] [address] [place], [place], [address] Phone: [phone]   Fax:  [phone]  Name: JAUAN WOHL MRN: [id number] Date of Birth: Jul 24, 1934

## 2021-03-24 ENCOUNTER — Ambulatory Visit (HOSPITAL_COMMUNITY): Payer: Medicare Other

## 2021-03-29 ENCOUNTER — Other Ambulatory Visit: Payer: Self-pay

## 2021-03-29 ENCOUNTER — Encounter (HOSPITAL_COMMUNITY): Payer: Self-pay

## 2021-03-29 ENCOUNTER — Ambulatory Visit (HOSPITAL_COMMUNITY): Payer: Medicare Other

## 2021-03-29 ENCOUNTER — Telehealth: Payer: Self-pay | Admitting: Cardiovascular Disease

## 2021-03-29 DIAGNOSIS — M79604 Pain in right leg: Secondary | ICD-10-CM

## 2021-03-29 DIAGNOSIS — S81801A Unspecified open wound, right lower leg, initial encounter: Secondary | ICD-10-CM

## 2021-03-29 DIAGNOSIS — R262 Difficulty in walking, not elsewhere classified: Secondary | ICD-10-CM

## 2021-03-29 NOTE — Telephone Encounter (Signed)
   [facility] Pre-operative Risk Assessment    Patient Name: [name]  DOB: Mar 25, 1934  MRN: [id number]   HEARTCARE STAFF: - Please ensure there is not already an duplicate clearance open for this procedure. - Under Visit Info/Reason for Call, type in Other and utilize the format Clearance MM/DD/YY or Clearance TBD. Do not use dashes or single digits. - If request is for dental extraction, please clarify the # of teeth to be extracted.  Request for surgical clearance:  1. What type of surgery is being performed? Extraction of tooth 19  2. When is this surgery scheduled? TBD   3. What type of clearance is required (medical clearance vs. Pharmacy clearance to hold med vs. Both)? Pharmacy   4. Are there any medications that need to be held prior to surgery and how long? Warfarin, TBD by Cardiology  5. Practice name and name of physician performing surgery? Dr. Aniceto Boss  6. What is the office phone number? [phone]   7.   What is the office fax number? No working fax at this time. email clearances to [email or web address]  8.   Anesthesia type (None, local, MAC, general) ? local   Johnna Acosta 03/29/2021, 4:05 PM  _________________________________________________________________   (provider comments below)

## 2021-03-29 NOTE — Telephone Encounter (Signed)
   Patient Name:  [name]  DOB:  [birth year]  MRN:  [id number]   Primary Cardiologist: None  Chart reviewed as part of pre-operative protocol coverage.   Simple dental extractions are considered low risk procedures per guidelines and generally do not require any specific cardiac clearance. It is also generally accepted that for simple extractions and dental cleanings, there is no need to interrupt blood thinner therapy.  Recommendations  Proceed with simple/single dental extraction.  Continue warfarin (Coumadin) without interruption.   SBE prophylaxis is not required for the patient from a cardiac standpoint.  Please call with questions. Richardson Dopp, PA-C 03/29/2021, 5:20 PM

## 2021-03-29 NOTE — Therapy (Signed)
[place], [place], [address] Phone: [phone]   Fax:  [phone]  Wound Care Therapy  Patient Details  Name: [name] MRN: [id number] Date of Birth: [birth year] Referring Provider (PT): Jeanella Craze, DO   Encounter Date: 03/29/2021   PT End of Session - 03/29/21 1606    Visit Number 31    Number of Visits 40    Date for PT Re-Evaluation 04/13/21    Authorization Type UHC medicare, no auth or VL    Progress Note Due on Visit 38    PT Start Time 1405    PT Stop Time 1450    PT Time Calculation (min) 45 min    Activity Tolerance No increased pain;Patient tolerated treatment well    Behavior During Therapy [facility] for tasks assessed/performed           Past Medical History:  Diagnosis Date  . Arthritis   . Atrial fibrillation ([place])   . Barrett's esophagus   . CAD (coronary artery disease)    echo 09-05-2010-EF nl, mod-severe dilated Left atrium; myoview 02-08-12-EF 52%, no ischemia  . Cancer [facility])    prostate - radiation only  . Diabetes mellitus without complication ([place])    "prediabetic"-no oral meds  . GERD (gastroesophageal reflux disease)   . Gout   . H/O transesophageal echocardiography (TEE) for monitoring 09/05/10   tee guided AT pace termination, did not proceed with cardioversion due to termination of the atrial flutter through his pacemaker  . History of cardioversion 02/09/13   electrical synchronized cardioversion, on flecainide and warfarin  . History of kidney stones    per pt - no actual diagnosisi  . History of stomach ulcers    many yrs ago  . HTN (hypertension)   . Pacemaker    medtronic  . Pacemaker 09-25-13  . Prostate cancer ([place]) 09-25-13   tx. with radiation only- dx. 2013    Past Surgical History:  Procedure Laterality Date  . APPENDECTOMY    . CARDIAC CATHETERIZATION  02/18/06   EF 65%, focal napkin ring-like lesion into the prox LAD otherwise normal coronaries  .  CARDIOVERSION N/A 02/09/2013   Procedure: CARDIOVERSION;  Surgeon: Sanda Klein, MD;  Location: [place] [facility];  Service: Cardiovascular;  Laterality: N/A;  . CATARACT EXTRACTION W/PHACO Left 12/23/2020   Procedure: CATARACT EXTRACTION PHACO AND INTRAOCULAR LENS PLACEMENT ([place]);  Surgeon: [name], MD;  [facility];  Service: Ophthalmology;  Laterality: Left;  CDE 18.55  . CATARACT EXTRACTION W/PHACO Right 01/09/2021   Procedure: CATARACT EXTRACTION PHACO AND INTRAOCULAR LENS PLACEMENT (IOC);  Surgeon: [name], MD;  Location: [facility];  Service: Ophthalmology;  Laterality: Right;  CDE: 28.65  . PACEMAKER GENERATOR CHANGE N/A 07/27/2014   Procedure: PACEMAKER GENERATOR CHANGE;  Surgeon: Sanda Klein, MD;  Location: [place] CATH [facility];  Service: Cardiovascular;  Laterality: N/A;  . PACEMAKER INSERTION     medtronic adapta- Dr. Sallyanne Kuster follows- LOV 9'14  . TONSILLECTOMY    . TOTAL KNEE ARTHROPLASTY Right 10/05/2013   Procedure: RIGHT TOTAL KNEE ARTHROPLASTY;  Surgeon: Gearlean Alf, MD;  Location[facility];  Service: Orthopedics;  Laterality: Right;    There were no vitals filed for this visit.    Subjective Assessment - 03/29/21 1600    Subjective Pt stated leg is feeling good today, no reports of pain.  Dressings intact.  Expecting compression garments to arrive later today.  Wound Therapy - 03/29/21 0001    Subjective Pt stated leg is feeling good today, no reports of pain.  Dressings intact.  Expecting compression garments to arrive later today.    Patient and Family Stated Goals wounds to heal    Prior Treatments neosporin, gauze, antibitics    Pain Scale 0-10    Pain Score 0-No pain    Evaluation and Treatment Procedures Explained to Patient/Family Yes    Evaluation and Treatment Procedures agreed to    Wound Properties Date First Assessed: 01/03/21 Time First Assessed: 1430 Wound Type: Diabetic ulcer Location: Pretibial Location Orientation:  Distal;Right Wound Description (Comments): right lower wound Present on Admission: Yes   Wound Image View All Images View Images    Dressing Type Compression wrap    Dressing Changed Changed    Dressing Status Old drainage    Dressing Change Frequency PRN    Site / Wound Assessment Bleeding;Granulation tissue    % Wound base Red or Granulating 100%    % Wound base Yellow/Fibrinous Exudate 0%    Peri-wound Assessment Intact;Erythema (blanchable)    Wound Length (cm) 2.9 cm    Wound Width (cm) 1.9 cm    Wound Surface Area (cm^2) 5.51 cm^2    Margins Attached edges (approximated)    Drainage Amount Minimal    Drainage Description Serosanguineous    Treatment Cleansed;Debridement (Selective)    Wound Properties Date First Assessed: 01/03/21 Wound Type: Diabetic ulcer Location: Tibial Location Orientation: Distal;Posterior;Right Wound Description (Comments): right posterior wound Present on Admission: Yes   Dressing Type Compression wrap    Dressing Changed Changed    Dressing Status Old drainage    Dressing Change Frequency PRN    % Wound base Red or Granulating 100%    Peri-wound Assessment Edema;Erythema (blanchable)    Margins Attached edges (approximated)    Drainage Amount Minimal    Drainage Description Serosanguineous    Treatment Cleansed    Selective Debridement - Location anterior lateral wound on right leg    Selective Debridement - Tools Used Scalpel    Selective Debridement - Tissue Removed biofilm, slough    Wound Therapy - Clinical Statement Rt LE wounds are progressing well wiht increased approximation in wound bed.  Selective debridment for removal of yellow sloug and biofilm in wound bed.  LE are dry, moisturized skin well prior application of dressings.  No reports of pain.  Pt stated he is expecting compression garments to arrive later today, encouraged to bring wiht him next session.    Wound Therapy - Functional Problem List difficulty with dressing    Factors  Delaying/Impairing Wound Healing Vascular compromise;Infection - systemic/local;Diabetes Mellitus   recent infection and heart disease   Wound Therapy - Frequency --   1x/week   Wound Therapy - Current Recommendations PT    Wound Plan continue with debridement and appropriate dressing change.  Take photos and measure each session as patient is 1x/week . CHANGE both leg dressings.    Dressing  xeroform, 2x2, 1/4 " foam  on BOTH anterior and posterior wounds, and proform lite    Dressing Lt LE: lotion and profore lite                     PT Short Term Goals - 02/20/21 1320      PT SHORT TERM GOAL #1   Title Wound on left leg will be completely healed to reduce risk of infection.    Time 4  Period Weeks    Status Achieved    Target Date 01/31/21      PT SHORT TERM GOAL #2   Title Anterior lower wound on right leg will be 100% granulated to deomstrate improved wound healing.    Time 4    Period Weeks    Status On-going    Target Date 01/31/21      PT SHORT TERM GOAL #3   Title Posterior  wound on right leg will be 100% granulated to demonstrate improved wound healing.    Time 4    Period Weeks    Status Achieved    Target Date 01/31/21      PT SHORT TERM GOAL #4   Title Patient will report importance of wearing daily compression garments to reduce swelling and risk of forming blisters    Time 4    Period Weeks    Status Achieved    Target Date 02/28/21             PT Long Term Goals - 02/20/21 1321      PT LONG TERM GOAL #1   Time 8    Period Weeks    Status New      PT LONG TERM GOAL #2   Title Anterior lower wound on right leg will be completely healed to reduce risk of infection.    Time 8    Period Weeks    Status On-going      PT LONG TERM GOAL #3   Title Posterior wound on right leg will be completely healed to reduce risk of infection.    Time 8    Period Weeks    Status On-going      PT LONG TERM GOAL #4   Title Patietn will report  <3/10 pain in legs due to wounds to reduce pain with walking    Baseline continued pain with debridement    Time 8    Period Weeks    Status On-going                  Patient will benefit from skilled therapeutic intervention in order to improve the following deficits and impairments:     Visit Diagnosis: Leg wound, right, initial encounter  Pain in right leg  Difficulty in walking, not elsewhere classified     Problem List Patient Active Problem List   Diagnosis Date Noted  . Cellulitis and abscess of right lower extremity 12/24/2020  . Diabetes mellitus without complication ([place])   . SSS (sick sinus syndrome) ([place]) 07/27/2014  . Pacemaker syndrome 07/27/2014  . Tachy-brady syndrome ([place]) 07/27/2014  . Difficulty in walking(719.7) 11/03/2013  . Stiffness of right knee 11/03/2013  . Postoperative anemia due to acute blood loss 10/06/2013  . OA (osteoarthritis) of knee 10/05/2013  . Preoperative cardiovascular examination 07/20/2013  . Chronic diastolic heart failure ([place]) 04/29/2013  . Long QT interval 04/21/2013  . Volume overload 04/21/2013  . Long term current use of anticoagulant therapy 01/09/2013  . HTN (hypertension) 05/20/2011  . Pleural effusion 05/20/2011  . Lactic acid acidosis 05/18/2011  . Pacemaker 05/18/2011  . Anemia 05/18/2011  . Acute renal failure (ARF) ([place]) 05/18/2011  . Pneumonia 05/17/2011  . Fever 05/17/2011  . Atrial fibrillation ([place]) 05/17/2011  . Hyperglycemia 05/17/2011  . Bradycardia 05/17/2011  . Gout 05/17/2011  . Arthritis 05/17/2011  . Thrombocytopenia ([place]) 05/17/2011   Ihor Austin, LPTA/CLT; CBIS [phone]   Aldona Lento 03/29/2021, 4:07 PM  [place]  [facility] [address] [place], [place], [address] Phone: [phone]   Fax:  [phone]  Name: [name] MRN: [id number] Date of Birth: [birth year]

## 2021-03-30 NOTE — Telephone Encounter (Signed)
I will email clearance over to email given.

## 2021-04-05 ENCOUNTER — Ambulatory Visit (HOSPITAL_COMMUNITY): Payer: Medicare Other | Attending: Internal Medicine | Admitting: Physical Therapy

## 2021-04-05 ENCOUNTER — Ambulatory Visit: Payer: Medicare Other | Admitting: *Deleted

## 2021-04-05 ENCOUNTER — Other Ambulatory Visit: Payer: Self-pay

## 2021-04-05 DIAGNOSIS — I4891 Unspecified atrial fibrillation: Secondary | ICD-10-CM

## 2021-04-05 DIAGNOSIS — S81801A Unspecified open wound, right lower leg, initial encounter: Secondary | ICD-10-CM

## 2021-04-05 DIAGNOSIS — Z7901 Long term (current) use of anticoagulants: Secondary | ICD-10-CM

## 2021-04-05 DIAGNOSIS — S81802A Unspecified open wound, left lower leg, initial encounter: Secondary | ICD-10-CM | POA: Diagnosis present

## 2021-04-05 DIAGNOSIS — R262 Difficulty in walking, not elsewhere classified: Secondary | ICD-10-CM | POA: Diagnosis present

## 2021-04-05 DIAGNOSIS — M79604 Pain in right leg: Secondary | ICD-10-CM | POA: Diagnosis present

## 2021-04-05 LAB — POCT INR: INR: 3.3 — AB (ref 2.0–3.0)

## 2021-04-05 NOTE — Patient Instructions (Signed)
Decrease warfarin to 1 tablet daily except 1/2 tablet on Mondays, Wednesdays and Fridays  Repeat INR in 4 weeks.

## 2021-04-05 NOTE — Therapy (Signed)
[place], [place], [address] Phone: [phone]   Fax:  [phone]  Wound Care Therapy  Patient Details  Name: [name] MRN: [id number] Date of Birth: [birth year] Referring Provider (PT): Jeanella Craze, DO   Encounter Date: 04/05/2021   PT End of Session - 04/05/21 1027    Visit Number 32    Number of Visits 40    Date for PT Re-Evaluation 04/13/21    Authorization Type UHC medicare, no auth or VL    Progress Note Due on Visit 38    PT Start Time 0915    PT Stop Time 1000    PT Time Calculation (min) 45 min    Activity Tolerance No increased pain;Patient tolerated treatment well    Behavior During Therapy [facility] for tasks assessed/performed           Past Medical History:  Diagnosis Date  . Arthritis   . Atrial fibrillation ([place])   . Barrett's esophagus   . CAD (coronary artery disease)    echo 09-05-2010-EF nl, mod-severe dilated Left atrium; myoview 02-08-12-EF 52%, no ischemia  . Cancer [facility])    prostate - radiation only  . Diabetes mellitus without complication ([place])    "prediabetic"-no oral meds  . GERD (gastroesophageal reflux disease)   . Gout   . H/O transesophageal echocardiography (TEE) for monitoring 09/05/10   tee guided AT pace termination, did not proceed with cardioversion due to termination of the atrial flutter through his pacemaker  . History of cardioversion 02/09/13   electrical synchronized cardioversion, on flecainide and warfarin  . History of kidney stones    per pt - no actual diagnosisi  . History of stomach ulcers    many yrs ago  . HTN (hypertension)   . Pacemaker    medtronic  . Pacemaker 09-25-13  . Prostate cancer ([place]) 09-25-13   tx. with radiation only- dx. 2013    Past Surgical History:  Procedure Laterality Date  . APPENDECTOMY    . CARDIAC CATHETERIZATION  02/18/06   EF 65%, focal napkin ring-like lesion into the prox LAD otherwise normal coronaries  .  CARDIOVERSION N/A 02/09/2013   Procedure: CARDIOVERSION;  Surgeon: Sanda Klein, MD;  Location: [place];  Service: Cardiovascular;  Laterality: N/A;  . CATARACT EXTRACTION W/PHACO Left 12/23/2020   Procedure: CATARACT EXTRACTION PHACO AND INTRAOCULAR LENS PLACEMENT ([place]);  Surgeon: [name], MD;  [facility];  Service: Ophthalmology;  Laterality: Left;  CDE 18.55  . CATARACT EXTRACTION W/PHACO Right 01/09/2021   Procedure: CATARACT EXTRACTION PHACO AND INTRAOCULAR LENS PLACEMENT ([facility]);  Surgeon: [name], MD;  [facility];  Service: Ophthalmology;  Laterality: Right;  CDE: 28.65  . PACEMAKER GENERATOR CHANGE N/A 07/27/2014   Procedure: PACEMAKER GENERATOR CHANGE;  Surgeon: Sanda Klein, MD;  Location: [place] CATH LAB;  Service: Cardiovascular;  Laterality: N/A;  . PACEMAKER INSERTION     medtronic adapta- Dr. Sallyanne Kuster follows- LOV 9'14  . TONSILLECTOMY    . TOTAL KNEE ARTHROPLASTY Right 10/05/2013   Procedure: RIGHT TOTAL KNEE ARTHROPLASTY;  Surgeon: Gearlean Alf, MD;  [facility];  Service: Orthopedics;  Laterality: Right;    There were no vitals filed for this visit.               Wound Therapy - 04/05/21 1023    Subjective pt reports he is feeling good, no pain or issues.    Patient and Family Stated Goals wounds to  heal    Prior Treatments neosporin, gauze, antibitics    Pain Scale 0-10    Pain Score 0-No pain    Evaluation and Treatment Procedures Explained to Patient/Family Yes    Evaluation and Treatment Procedures agreed to    Wound Properties Date First Assessed: 01/03/21 Time First Assessed: 1430 Wound Type: Diabetic ulcer Location: Pretibial Location Orientation: Distal;Right Wound Description (Comments): right lower wound Present on Admission: Yes   Dressing Type Compression wrap    Dressing Changed Changed    Dressing Status Old drainage    Dressing Change Frequency PRN    Site / Wound Assessment Granulation tissue    % Wound base  Red or Granulating 100%    % Wound base Yellow/Fibrinous Exudate 0%    Peri-wound Assessment Intact    Wound Length (cm) 2.8 cm    Wound Width (cm) 1.6 cm    Wound Depth (cm) 0 cm    Wound Volume (cm^3) 0 cm^3    Wound Surface Area (cm^2) 4.48 cm^2    Margins Attached edges (approximated)    Drainage Amount Minimal    Drainage Description Serosanguineous    Treatment Cleansed;Debridement (Selective)    Selective Debridement - Location anterior lateral wound on right leg    Selective Debridement - Tools Used Scalpel    Selective Debridement - Tissue Removed biofilm, slough    Wound Therapy - Clinical Statement Pt comes today with zippered stocking, however appears to be safe due to inside lining which protects skin from device.  Had additional therapist inspect too (CRussell) which was in agreement.  Pt able to don/doff stocking independently on Lt LE without issues.  Wound on Rt lateral LE continues to approximate and heal nicely.  Able to debride away slough that was remaining around lateral border and central wound.  Cleansed and moistured Rt LE well prior to redressing with profore lite.  Reviewed wear/wash of garments and pt able to verbalize appropriately.    Wound Therapy - Functional Problem List difficulty with dressing    Factors Delaying/Impairing Wound Healing Vascular compromise;Infection - systemic/local;Diabetes Mellitus   recent infection and heart disease   Wound Therapy - Frequency --   1x/week   Wound Therapy - Current Recommendations PT    Wound Plan continue with debridement and appropriate dressing change.  Take photos and measure each session as patient is 1x/week .    Dressing  xeroform, 2x2, 1/4 " foam  on BOTH anterior and posterior wounds, and proform lite                   PT Education - 04/05/21 1027    Education Details donning/doffing stockings, care for washing/wear time    Person(s) Educated Patient    Methods Explanation;Demonstration     Comprehension Verbalized understanding;Returned demonstration            PT Short Term Goals - 02/20/21 1320      PT SHORT TERM GOAL #1   Title Wound on left leg will be completely healed to reduce risk of infection.    Time 4    Period Weeks    Status Achieved    Target Date 01/31/21      PT SHORT TERM GOAL #2   Title Anterior lower wound on right leg will be 100% granulated to deomstrate improved wound healing.    Time 4    Period Weeks    Status On-going    Target Date 01/31/21  PT SHORT TERM GOAL #3   Title Posterior  wound on right leg will be 100% granulated to demonstrate improved wound healing.    Time 4    Period Weeks    Status Achieved    Target Date 01/31/21      PT SHORT TERM GOAL #4   Title Patient will report importance of wearing daily compression garments to reduce swelling and risk of forming blisters    Time 4    Period Weeks    Status Achieved    Target Date 02/28/21             PT Long Term Goals - 02/20/21 1321      PT LONG TERM GOAL #1   Time 8    Period Weeks    Status New      PT LONG TERM GOAL #2   Title Anterior lower wound on right leg will be completely healed to reduce risk of infection.    Time 8    Period Weeks    Status On-going      PT LONG TERM GOAL #3   Title Posterior wound on right leg will be completely healed to reduce risk of infection.    Time 8    Period Weeks    Status On-going      PT LONG TERM GOAL #4   Title Patietn will report <3/10 pain in legs due to wounds to reduce pain with walking    Baseline continued pain with debridement    Time 8    Period Weeks    Status On-going                  Patient will benefit from skilled therapeutic intervention in order to improve the following deficits and impairments:     Visit Diagnosis: Leg wound, right, initial encounter  Pain in right leg  Open leg wound, left, initial encounter     Problem List Patient Active Problem List    Diagnosis Date Noted  . Cellulitis and abscess of right lower extremity 12/24/2020  . Diabetes mellitus without complication ([place])   . SSS (sick sinus syndrome) ([place]) 07/27/2014  . Pacemaker syndrome 07/27/2014  . Tachy-brady syndrome ([place]) 07/27/2014  . Difficulty in walking(719.7) 11/03/2013  . Stiffness of right knee 11/03/2013  . Postoperative anemia due to acute blood loss 10/06/2013  . OA (osteoarthritis) of knee 10/05/2013  . Preoperative cardiovascular examination 07/20/2013  . Chronic diastolic heart failure ([place]) 04/29/2013  . Long QT interval 04/21/2013  . Volume overload 04/21/2013  . Long term current use of anticoagulant therapy 01/09/2013  . HTN (hypertension) 05/20/2011  . Pleural effusion 05/20/2011  . Lactic acid acidosis 05/18/2011  . Pacemaker 05/18/2011  . Anemia 05/18/2011  . Acute renal failure (ARF) ([place]) 05/18/2011  . Pneumonia 05/17/2011  . Fever 05/17/2011  . Atrial fibrillation ([place]) 05/17/2011  . Hyperglycemia 05/17/2011  . Bradycardia 05/17/2011  . Gout 05/17/2011  . Arthritis 05/17/2011  . Thrombocytopenia ([place]) 05/17/2011    Teena Irani, PTA/CLT [phone]  Teena Irani 04/05/2021, 10:29 AM  [place] [address] [place], [place], [address] Phone: [phone]   Fax:  [phone]  Name: [name] MRN: [id number] Date of Birth: 1934/03/02

## 2021-04-12 ENCOUNTER — Ambulatory Visit (HOSPITAL_COMMUNITY): Payer: Medicare Other | Admitting: Physical Therapy

## 2021-04-12 ENCOUNTER — Other Ambulatory Visit: Payer: Self-pay

## 2021-04-12 ENCOUNTER — Encounter (HOSPITAL_COMMUNITY): Payer: Self-pay | Admitting: Physical Therapy

## 2021-04-12 DIAGNOSIS — S81802A Unspecified open wound, left lower leg, initial encounter: Secondary | ICD-10-CM

## 2021-04-12 DIAGNOSIS — S81801A Unspecified open wound, right lower leg, initial encounter: Secondary | ICD-10-CM | POA: Diagnosis not present

## 2021-04-12 DIAGNOSIS — M79604 Pain in right leg: Secondary | ICD-10-CM

## 2021-04-12 DIAGNOSIS — R262 Difficulty in walking, not elsewhere classified: Secondary | ICD-10-CM

## 2021-04-12 NOTE — Therapy (Signed)
[place], [place], [address] Phone: [phone]   Fax:  [phone]  Wound Care Therapy  Patient Details  Name: [name] MRN: [id number] Date of Birth: 1934/02/09 Referring Provider (PT): Jeanella Craze, DO   Encounter Date: 04/12/2021   PT End of Session - 04/12/21 1036    Visit Number 33    Number of Visits 40    Date for PT Re-Evaluation 05/10/21    Authorization Type UHC medicare, no auth or VL    Progress Note Due on Visit 38    PT Start Time 1000    PT Stop Time 1030    PT Time Calculation (min) 30 min    Activity Tolerance No increased pain;Patient tolerated treatment well    Behavior During Therapy [facility] for tasks assessed/performed           Past Medical History:  Diagnosis Date  . Arthritis   . Atrial fibrillation ([place])   . Barrett's esophagus   . CAD (coronary artery disease)    echo 09-05-2010-EF nl, mod-severe dilated Left atrium; myoview 02-08-12-EF 52%, no ischemia  . Cancer [facility])    prostate - radiation only  . Diabetes mellitus without complication ([place])    "prediabetic"-no oral meds  . GERD (gastroesophageal reflux disease)   . Gout   . H/O transesophageal echocardiography (TEE) for monitoring 09/05/10   tee guided AT pace termination, did not proceed with cardioversion due to termination of the atrial flutter through his pacemaker  . History of cardioversion 02/09/13   electrical synchronized cardioversion, on flecainide and warfarin  . History of kidney stones    per pt - no actual diagnosisi  . History of stomach ulcers    many yrs ago  . HTN (hypertension)   . Pacemaker    medtronic  . Pacemaker 09-25-13  . Prostate cancer ([place]) 09-25-13   tx. with radiation only- dx. 2013    Past Surgical History:  Procedure Laterality Date  . APPENDECTOMY    . CARDIAC CATHETERIZATION  02/18/06   EF 65%, focal napkin ring-like lesion into the prox LAD otherwise normal coronaries  .  CARDIOVERSION N/A 02/09/2013   Procedure: CARDIOVERSION;  Surgeon: Sanda Klein, MD;  Location: [place] ENDOSCOPY;  Service: Cardiovascular;  Laterality: N/A;  . CATARACT EXTRACTION W/PHACO Left 12/23/2020   Procedure: CATARACT EXTRACTION PHACO AND INTRAOCULAR LENS PLACEMENT ([place]);  Surgeon: [name], MD;  [facility];  Service: Ophthalmology;  Laterality: Left;  CDE 18.55  . CATARACT EXTRACTION W/PHACO Right 01/09/2021   Procedure: CATARACT EXTRACTION PHACO AND INTRAOCULAR LENS PLACEMENT ([facility]);  Surgeon: [name], MD;  Location: [facility];  Service: Ophthalmology;  Laterality: Right;  CDE: 28.65  . PACEMAKER GENERATOR CHANGE N/A 07/27/2014   Procedure: PACEMAKER GENERATOR CHANGE;  Surgeon: Sanda Klein, MD;  Location: [place] CATH LAB;  Service: Cardiovascular;  Laterality: N/A;  . PACEMAKER INSERTION     medtronic adapta- Dr. Sallyanne Kuster follows- LOV 9'14  . TONSILLECTOMY    . TOTAL KNEE ARTHROPLASTY Right 10/05/2013   Procedure: RIGHT TOTAL KNEE ARTHROPLASTY;  Surgeon: Gearlean Alf, MD;  [facility];  Service: Orthopedics;  Laterality: Right;    There were no vitals filed for this visit.               Wound Therapy - 04/12/21 0001    Subjective Reports no pain or difficulties, states he placed gauze under zipper of compression garements and that seemed to help  Patient and Family Stated Goals wounds to heal    Prior Treatments neosporin, gauze, antibitics    Pain Scale 0-10    Pain Score 0-No pain    Evaluation and Treatment Procedures Explained to Patient/Family Yes    Evaluation and Treatment Procedures agreed to    Wound Properties Date First Assessed: 01/03/21 Time First Assessed: 1430 Wound Type: Diabetic ulcer Location: Pretibial Location Orientation: Distal;Right Wound Description (Comments): right lower wound Present on Admission: Yes   Wound Image View All Images View Images    Dressing Type Compression wrap;Impregnated gauze (bismuth)    Dressing Changed  Changed    Dressing Status Old drainage    Dressing Change Frequency PRN    Site / Wound Assessment Granulation tissue    % Wound base Red or Granulating 100%    % Wound base Yellow/Fibrinous Exudate 0%    Peri-wound Assessment Intact    Margins Attached edges (approximated)    Drainage Amount Minimal    Drainage Description Serosanguineous    Treatment Cleansed;Debridement (Selective)    Selective Debridement - Location anterior lateral wound on right leg    Selective Debridement - Tools Used Scalpel    Selective Debridement - Tissue Removed biofilm, slough    Wound Therapy - Clinical Statement Wound conitnues to improve in granulation and size. No pain noted during session. Continued with xerofrom and 1/4 inch foam as well as profore lite. Will cotninue with current POC as tolerated. Extending POC additional 4 weeks at 1x/week to continue to work on promoting optimal wound healing.    Wound Therapy - Functional Problem List difficulty with dressing    Factors Delaying/Impairing Wound Healing Vascular compromise;Infection - systemic/local;Diabetes Mellitus   recent infection and heart disease   Wound Therapy - Frequency --   1x/week   Wound Therapy - Current Recommendations PT    Wound Plan continue with debridement and appropriate dressing change.  Take photos and measure each session as patient is 1x/week .    Dressing  xeroform, 2x2, 1/4 " foam  on right anterior wounds, and proform lite                     PT Short Term Goals - 02/20/21 1320      PT SHORT TERM GOAL #1   Title Wound on left leg will be completely healed to reduce risk of infection.    Time 4    Period Weeks    Status Achieved    Target Date 01/31/21      PT SHORT TERM GOAL #2   Title Anterior lower wound on right leg will be 100% granulated to deomstrate improved wound healing.    Time 4    Period Weeks    Status On-going    Target Date 01/31/21      PT SHORT TERM GOAL #3   Title Posterior   wound on right leg will be 100% granulated to demonstrate improved wound healing.    Time 4    Period Weeks    Status Achieved    Target Date 01/31/21      PT SHORT TERM GOAL #4   Title Patient will report importance of wearing daily compression garments to reduce swelling and risk of forming blisters    Time 4    Period Weeks    Status Achieved    Target Date 02/28/21             PT Long Term Goals - 02/20/21  [place] #1   Time 8    Period Weeks    Status New      PT LONG TERM GOAL #2   Title Anterior lower wound on right leg will be completely healed to reduce risk of infection.    Time 8    Period Weeks    Status On-going      PT LONG TERM GOAL #3   Title Posterior wound on right leg will be completely healed to reduce risk of infection.    Time 8    Period Weeks    Status On-going      PT LONG TERM GOAL #4   Title Patietn will report <3/10 pain in legs due to wounds to reduce pain with walking    Baseline continued pain with debridement    Time 8    Period Weeks    Status On-going                 Plan - 04/12/21 1037    Clinical Impression Statement see above    Personal Factors and Comorbidities Comorbidity 1;Comorbidity 2;Comorbidity 3+    Comorbidities pacemaker, afib, on anticoagulents, vaicose veins.    Examination-Activity Limitations Locomotion Level;Hygiene/Grooming;Stand    Examination-Participation Restrictions Community Activity    Stability/Clinical Decision Making Stable/Uncomplicated    PT Frequency 1x / week    PT Duration 4 weeks    PT Treatment/Interventions ADLs/Self Care Home Management;Manual techniques;Other (comment)   wound care, modalities   PT Next Visit Plan continue with debridement and dressing changes - measure wounds    PT Home Exercise Plan ankle pumps    Consulted and Agree with Plan of Care Patient           Patient will benefit from skilled therapeutic intervention in order to improve the  following deficits and impairments:  Other (comment),Pain,Increased edema,Decreased skin integrity,Decreased mobility,Decreased activity tolerance (wounds)  Visit Diagnosis: Leg wound, right, initial encounter  Pain in right leg  Open leg wound, left, initial encounter  Difficulty in walking, not elsewhere classified     Problem List Patient Active Problem List   Diagnosis Date Noted  . Cellulitis and abscess of right lower extremity 12/24/2020  . Diabetes mellitus without complication ([place])   . SSS (sick sinus syndrome) ([place]) 07/27/2014  . Pacemaker syndrome 07/27/2014  . Tachy-brady syndrome ([place]) 07/27/2014  . Difficulty in walking(719.7) 11/03/2013  . Stiffness of right knee 11/03/2013  . Postoperative anemia due to acute blood loss 10/06/2013  . OA (osteoarthritis) of knee 10/05/2013  . Preoperative cardiovascular examination 07/20/2013  . Chronic diastolic heart failure ([place]) 04/29/2013  . Long QT interval 04/21/2013  . Volume overload 04/21/2013  . Long term current use of anticoagulant therapy 01/09/2013  . HTN (hypertension) 05/20/2011  . Pleural effusion 05/20/2011  . Lactic acid acidosis 05/18/2011  . Pacemaker 05/18/2011  . Anemia 05/18/2011  . Acute renal failure (ARF) ([place]) 05/18/2011  . Pneumonia 05/17/2011  . Fever 05/17/2011  . Atrial fibrillation ([place]) 05/17/2011  . Hyperglycemia 05/17/2011  . Bradycardia 05/17/2011  . Gout 05/17/2011  . Arthritis 05/17/2011  . Thrombocytopenia ([place]) 05/17/2011   10:41 AM, 04/12/21 Jerene Pitch, DPT Physical Therapy with [facility]  [phone] office  [place] [address] [place], [place], [address] Phone: [phone]   Fax:  [phone]  Name: [name] MRN: [id number] Date of Birth: 25-Aug-1934

## 2021-04-19 ENCOUNTER — Ambulatory Visit (HOSPITAL_COMMUNITY): Payer: Medicare Other | Admitting: Physical Therapy

## 2021-04-19 ENCOUNTER — Other Ambulatory Visit: Payer: Self-pay

## 2021-04-19 ENCOUNTER — Encounter (HOSPITAL_COMMUNITY): Payer: Self-pay | Admitting: Physical Therapy

## 2021-04-19 DIAGNOSIS — S81801A Unspecified open wound, right lower leg, initial encounter: Secondary | ICD-10-CM | POA: Diagnosis not present

## 2021-04-19 NOTE — Therapy (Signed)
[place], [place], [address] Phone: [phone]   Fax:  [phone]  Wound Care Therapy  Patient Details  Name: [name] MRN: [id number] Date of Birth: 1934/01/01 Referring Provider (PT): Jeanella Craze, DO   Encounter Date: 04/19/2021  PHYSICAL THERAPY DISCHARGE SUMMARY  Visits from Start of Care: 33  Current functional level related to goals / functional outcomes: Wound is healed    Remaining deficits: None    Education / Equipment: Importance for compression    Patient agrees to discharge. Patient goals were met. Patient is being discharged due to meeting the stated rehab goals.   PT End of Session - 04/19/21 1208     Visit Number 33    Number of Visits 33    Date for PT Re-Evaluation 05/10/21    Authorization Type UHC medicare, no auth or VL    Progress Note Due on Visit 33    PT Start Time 1130    PT Stop Time 1200    PT Time Calculation (min) 30 min    Activity Tolerance No increased pain;Patient tolerated treatment well    Behavior During Therapy [facility] for tasks assessed/performed             Past Medical History:  Diagnosis Date   Arthritis    Atrial fibrillation ([place])    Barrett's esophagus    CAD (coronary artery disease)    echo 09-05-2010-EF nl, mod-severe dilated Left atrium; myoview 02-08-12-EF 52%, no ischemia   Cancer ([facility])    prostate - radiation only   Diabetes mellitus without complication ([place])    "prediabetic"-no oral meds   GERD (gastroesophageal reflux disease)    Gout    H/O transesophageal echocardiography (TEE) for monitoring 09/05/10   tee guided AT pace termination, did not proceed with cardioversion due to termination of the atrial flutter through his pacemaker   History of cardioversion 02/09/13   electrical synchronized cardioversion, on flecainide and warfarin   History of kidney stones    per pt - no actual diagnosisi   History of stomach ulcers    many  yrs ago   HTN (hypertension)    Pacemaker    medtronic   Pacemaker 09-25-13   Prostate cancer ([place]) 09-25-13   tx. with radiation only- dx. 2013    Past Surgical History:  Procedure Laterality Date   APPENDECTOMY     CARDIAC CATHETERIZATION  02/18/06   EF 65%, focal napkin ring-like lesion into the prox LAD otherwise normal coronaries   CARDIOVERSION N/A 02/09/2013   Procedure: CARDIOVERSION;  Surgeon: Sanda Klein, MD;  Location: [place];  Service: Cardiovascular;  Laterality: N/A;   CATARACT EXTRACTION W/PHACO Left 12/23/2020   Procedure: CATARACT EXTRACTION PHACO AND INTRAOCULAR LENS PLACEMENT ([place]);  Surgeon: [name], MD;  [facility];  Service: Ophthalmology;  Laterality: Left;  CDE 18.55   CATARACT EXTRACTION W/PHACO Right 01/09/2021   Procedure: CATARACT EXTRACTION PHACO AND INTRAOCULAR LENS PLACEMENT ([facility]);  Surgeon: [name], MD;  [facility];  Service: Ophthalmology;  Laterality: Right;  CDE: 28.65   PACEMAKER GENERATOR CHANGE N/A 07/27/2014   Procedure: PACEMAKER GENERATOR CHANGE;  Surgeon: Sanda Klein, MD;  Location: [place] CATH LAB;  Service: Cardiovascular;  Laterality: N/A;   PACEMAKER INSERTION     medtronic adapta- Dr. Sallyanne Kuster follows- LOV 9'14   TONSILLECTOMY     TOTAL KNEE ARTHROPLASTY Right 10/05/2013   Procedure: RIGHT TOTAL KNEE ARTHROPLASTY;  Surgeon: Gearlean Alf,  MD;  [facility];  Service: Orthopedics;  Laterality: Right;    There were no vitals filed for this visit.               Wound Therapy - 04/19/21 0001     Subjective Reports no pain or difficulties, states he placed gauze under zipper of compression garements and that seemed to help    Patient and Family Stated Goals wounds to heal    Prior Treatments neosporin, gauze, antibitics    Pain Scale 0-10    Pain Score 0-No pain    Evaluation and Treatment Procedures Explained to Patient/Family Yes    Evaluation and Treatment Procedures agreed to    Wound  Properties Date First Assessed: 01/03/21 Time First Assessed: 1430 Wound Type: Diabetic ulcer Location: Pretibial Location Orientation: Distal;Right Wound Description (Comments): right lower wound Present on Admission: Yes Final Assessment Date: 04/19/21 Final Assessment Time: 1135   Drainage Amount Scant    Drainage Description Sanguineous    Treatment Cleansed   manual   Wound Properties Date First Assessed: 01/03/21 Wound Type: Diabetic ulcer Location: Tibial Location Orientation: Distal;Posterior;Right Wound Description (Comments): right posterior wound Present on Admission: Yes   Wound Image Images linked: 1    Selective Debridement - Location --    Selective Debridement - Tools Used --    Selective Debridement - Tissue Removed --    Wound Therapy - Clinical Statement Wound is healed except for a small pinpoint area that is slightly bleeding.  The area where the wound was skin is fabrile.  Therapist instructed pt to place 4x4 over area prior to donning compression garment for the first few days to protect skin from sheering forces when pulling the garment up.  Therapist completed manual decongestive technique on LE to reduce small areas of bottlenecking with good results.    Wound Therapy - Functional Problem List difficulty with dressing    Factors Delaying/Impairing Wound Healing Vascular compromise;Infection - systemic/local;Diabetes Mellitus   recent infection and heart disease   Wound Therapy - Frequency --    Wound Therapy - Current Recommendations PT    Wound Plan Discontinue to self care.    Dressing  4x4 adheared with coban to protect skin.                Manual completed to reduce bottle necking in  LE.       PT Education - 04/19/21 1208     Education Details The importance of donning compression garment as soon as pt wakes up and keep garment on all day.    Person(s) Educated Patient    Methods Explanation    Comprehension Verbalized understanding               PT Short Term Goals - 04/19/21 1209       PT SHORT TERM GOAL #1   Title Wound on left leg will be completely healed to reduce risk of infection.    Time 4    Period Weeks    Status Achieved    Target Date 01/31/21      PT SHORT TERM GOAL #2   Title Anterior lower wound on right leg will be 100% granulated to deomstrate improved wound healing.    Time 4    Period Weeks    Status Achieved    Target Date 01/31/21      PT SHORT TERM GOAL #3   Title Posterior  wound on right leg will be 100% granulated  to demonstrate improved wound healing.    Time 4    Period Weeks    Status Achieved    Target Date 01/31/21      PT SHORT TERM GOAL #4   Title Patient will report importance of wearing daily compression garments to reduce swelling and risk of forming blisters    Time 4    Period Weeks    Status Achieved    Target Date 02/28/21               PT Long Term Goals - 04/19/21 1210       PT LONG TERM GOAL #1   Time 8    Period Weeks      PT LONG TERM GOAL #2   Title Anterior lower wound on right leg will be completely healed to reduce risk of infection.    Time 8    Period Weeks    Status Achieved      PT LONG TERM GOAL #3   Title Posterior wound on right leg will be completely healed to reduce risk of infection.    Time 8    Period Weeks    Status Achieved      PT LONG TERM GOAL #4   Title Patietn will report <3/10 pain in legs due to wounds to reduce pain with walking    Baseline continued pain with debridement    Time 8    Period Weeks    Status Achieved                   Plan - 04/19/21 1209     Clinical Impression Statement see above    Personal Factors and Comorbidities Comorbidity 1;Comorbidity 2;Comorbidity 3+    Comorbidities pacemaker, afib, on anticoagulents, vaicose veins.    Examination-Activity Limitations Locomotion Level;Hygiene/Grooming;Stand    Examination-Participation Restrictions Community Activity    Stability/Clinical  Decision Making Stable/Uncomplicated    PT Frequency 1x / week    PT Duration 4 weeks    PT Treatment/Interventions ADLs/Self Care Home Management;Manual techniques;Other (comment)   wound care, modalities   PT Next Visit Plan continue with debridement and dressing changes - measure wounds    PT Home Exercise Plan ankle pumps    Consulted and Agree with Plan of Care Patient             Patient will benefit from skilled therapeutic intervention in order to improve the following deficits and impairments:  Other (comment), Pain, Increased edema, Decreased skin integrity, Decreased mobility, Decreased activity tolerance (wounds)  Visit Diagnosis: Leg wound, right, initial encounter     Problem List Patient Active Problem List   Diagnosis Date Noted   Cellulitis and abscess of right lower extremity 12/24/2020   Diabetes mellitus without complication ([facility])    SSS (sick sinus syndrome) ([place]) 07/27/2014   Pacemaker syndrome 07/27/2014   Tachy-brady syndrome ([place]) 07/27/2014   Difficulty in walking(719.7) 11/03/2013   Stiffness of right knee 11/03/2013   Postoperative anemia due to acute blood loss 10/06/2013   OA (osteoarthritis) of knee 10/05/2013   Preoperative cardiovascular examination 07/20/2013   Chronic diastolic heart failure ([place]) 04/29/2013   Long QT interval 04/21/2013   Volume overload 04/21/2013   Long term current use of anticoagulant therapy 01/09/2013   HTN (hypertension) 05/20/2011   Pleural effusion 05/20/2011   Lactic acid acidosis 05/18/2011   Pacemaker 05/18/2011   Anemia 05/18/2011   Acute renal failure (ARF) ([place]) 05/18/2011   Pneumonia 05/17/2011  Fever 05/17/2011   Atrial fibrillation ([place]) 05/17/2011   Hyperglycemia 05/17/2011   Bradycardia 05/17/2011   Gout 05/17/2011   Arthritis 05/17/2011   Thrombocytopenia ([place]) 05/17/2011    Rayetta Humphrey, PT CLT [phone]  04/19/2021, 12:12 PM  [place] [address] [place], [place], [address] Phone: [phone]   Fax:  [phone]  Name: [name] MRN: [id number] Date of Birth: October 08, 1934

## 2021-04-26 ENCOUNTER — Ambulatory Visit (HOSPITAL_COMMUNITY): Payer: Self-pay | Admitting: Physical Therapy

## 2021-05-02 ENCOUNTER — Ambulatory Visit (HOSPITAL_COMMUNITY): Payer: Medicare Other | Admitting: Physical Therapy

## 2021-05-03 ENCOUNTER — Ambulatory Visit (INDEPENDENT_AMBULATORY_CARE_PROVIDER_SITE_OTHER): Payer: Medicare Other | Admitting: *Deleted

## 2021-05-03 DIAGNOSIS — Z7901 Long term (current) use of anticoagulants: Secondary | ICD-10-CM | POA: Diagnosis not present

## 2021-05-03 DIAGNOSIS — I4891 Unspecified atrial fibrillation: Secondary | ICD-10-CM

## 2021-05-03 LAB — POCT INR: INR: 3.6 — AB (ref 2.0–3.0)

## 2021-05-03 NOTE — Patient Instructions (Signed)
Hold warfarin tonight then resume 1 tablet daily except 1/2 tablet on Mondays, Wednesdays and Fridays Increase greens/salads Took sinus meds x 2 wks  Repeat INR in 3 weeks.

## 2021-05-09 ENCOUNTER — Ambulatory Visit (HOSPITAL_COMMUNITY): Payer: Medicare Other | Admitting: Physical Therapy

## 2021-05-10 ENCOUNTER — Ambulatory Visit (INDEPENDENT_AMBULATORY_CARE_PROVIDER_SITE_OTHER): Payer: Medicare Other

## 2021-05-10 DIAGNOSIS — I495 Sick sinus syndrome: Secondary | ICD-10-CM

## 2021-05-11 LAB — CUP PACEART REMOTE DEVICE CHECK
Battery Impedance: 458 Ohm
Battery Remaining Longevity: 109 mo
Battery Voltage: 2.78 V
Brady Statistic RV Percent Paced: 97 %
Date Time Interrogation Session: 20220707094958
Implantable Lead Implant Date: 20070417
Implantable Lead Implant Date: 20070417
Implantable Lead Location: 753859
Implantable Lead Location: 753860
Implantable Lead Model: 5092
Implantable Lead Model: 5594
Implantable Pulse Generator Implant Date: 20150922
Lead Channel Impedance Value: 67 Ohm
Lead Channel Impedance Value: 673 Ohm
Lead Channel Pacing Threshold Amplitude: 1 V
Lead Channel Pacing Threshold Pulse Width: 0.4 ms
Lead Channel Setting Pacing Amplitude: 2 V
Lead Channel Setting Pacing Pulse Width: 0.4 ms
Lead Channel Setting Sensing Sensitivity: 5.6 mV

## 2021-05-24 ENCOUNTER — Ambulatory Visit: Payer: Medicare Other | Admitting: *Deleted

## 2021-05-24 ENCOUNTER — Other Ambulatory Visit: Payer: Self-pay | Admitting: Cardiovascular Disease

## 2021-05-24 DIAGNOSIS — I4891 Unspecified atrial fibrillation: Secondary | ICD-10-CM | POA: Diagnosis not present

## 2021-05-24 DIAGNOSIS — Z7901 Long term (current) use of anticoagulants: Secondary | ICD-10-CM

## 2021-05-24 LAB — POCT INR: INR: 2.9 (ref 2.0–3.0)

## 2021-05-24 NOTE — Patient Instructions (Signed)
Continue warfarin 1 tablet daily except 1/2 tablet on Mondays, Wednesdays and Fridays Increase greens/salads  Repeat INR in 4 weeks.

## 2021-05-31 NOTE — Progress Notes (Signed)
Remote pacemaker transmission.   

## 2021-06-20 ENCOUNTER — Ambulatory Visit: Payer: Medicare Other | Admitting: *Deleted

## 2021-06-20 DIAGNOSIS — Z7901 Long term (current) use of anticoagulants: Secondary | ICD-10-CM | POA: Diagnosis not present

## 2021-06-20 DIAGNOSIS — I4891 Unspecified atrial fibrillation: Secondary | ICD-10-CM | POA: Diagnosis not present

## 2021-06-20 LAB — POCT INR: INR: 3 (ref 2.0–3.0)

## 2021-06-20 NOTE — Patient Instructions (Signed)
Continue warfarin 1 tablet daily except 1/2 tablet on Mondays, Wednesdays and Fridays Increase greens/salads  Repeat INR in 4 weeks.

## 2021-07-03 ENCOUNTER — Ambulatory Visit (INDEPENDENT_AMBULATORY_CARE_PROVIDER_SITE_OTHER): Payer: Medicare Other | Admitting: Cardiovascular Disease

## 2021-07-03 ENCOUNTER — Other Ambulatory Visit: Payer: Self-pay

## 2021-07-03 ENCOUNTER — Encounter: Payer: Self-pay | Admitting: Cardiovascular Disease

## 2021-07-03 VITALS — BP 144/74 | HR 77 | Ht 66.5 in | Wt 175.6 lb

## 2021-07-03 DIAGNOSIS — I5032 Chronic diastolic (congestive) heart failure: Secondary | ICD-10-CM

## 2021-07-03 DIAGNOSIS — Z950 Presence of cardiac pacemaker: Secondary | ICD-10-CM | POA: Diagnosis not present

## 2021-07-03 DIAGNOSIS — I10 Essential (primary) hypertension: Secondary | ICD-10-CM | POA: Diagnosis not present

## 2021-07-03 DIAGNOSIS — Z7901 Long term (current) use of anticoagulants: Secondary | ICD-10-CM

## 2021-07-03 DIAGNOSIS — I4821 Permanent atrial fibrillation: Secondary | ICD-10-CM | POA: Diagnosis not present

## 2021-07-03 DIAGNOSIS — R9431 Abnormal electrocardiogram [ECG] [EKG]: Secondary | ICD-10-CM

## 2021-07-03 NOTE — Patient Instructions (Signed)
Medication Instructions:  No Changes In Medications at this time.  *If you need a refill on your cardiac medications before your next appointment, please call your pharmacy*  Follow-Up: At [facility], you and your health needs are our priority.  As part of our continuing mission to provide you with exceptional heart care, we have created designated Provider Care Teams.  These Care Teams include your primary Cardiologist (physician) and Advanced Practice Providers (APPs -  Physician Assistants and Nurse Practitioners) who all work together to provide you with the care you need, when you need it.  We recommend signing up for the patient portal called "MyChart".  Sign up information is provided on this After Visit Summary.  MyChart is used to connect with patients for Virtual Visits (Telemedicine).  Patients are able to view lab/test results, encounter notes, upcoming appointments, etc.  Non-urgent messages can be sent to your provider as well.   To learn more about what you can do with MyChart, go to [email or web address].    Your next appointment:   1 year(s) PACER DAY   The format for your next appointment:   In Person  Provider:   Sanda Klein, MD

## 2021-07-03 NOTE — Progress Notes (Signed)
Patient ID: [name], male   DOB: 1934/04/17, 87 y.o.   MRN: [id number]    Cardiology Office Note    Date:  07/03/2021   ID:  [name], DOB 1934-03-28, MRN [id number]  PCP:  Celene Squibb, MD  Cardiologist:   Sanda Klein, MD   No chief complaint on file.   History of Present Illness:  [name] is a 87 y.o. male with longstanding persistent atrial fibrillation (history of pacemaker implanted for tachycardia-bradycardia syndrome) who returns for pacemaker check.   His wife [name] passed away on 2020-05-15.  They were married for 60 years.  He still living in their old home (where he has lived for over 54 years), by himself.  His children help a lot.  Severe edema of the lower extremities and nonhealing superficial wounds, resolved after several visits to the wound center.  He is wearing compression stockings and keeping his legs elevated and currently has no edema at all.  He takes a very low-dose of furosemide 20 mg daily.  If he forgets to take it for a day his weight will pick up by about 2 pounds but he will not develop swelling unless he stops it for more than a day.  He denies orthopnea, PND or exertional dyspnea.  He does not have claudication or focal neurological complaints.  He has not had any falls or serious bleeding problems.  He is on chronic warfarin anticoagulation and is compliant with monitoring.  His dual-chamber device is now programmed VVIR for what is felt to be permanent atrial fibrillation.  He has slow ventricular rates and about 97% ventricular pacing, but does have a reliable underlying rhythm at about 40-45 bpm.  He has not had any episodes of high ventricular rate.  Estimated generator longevity is about 9 years (Medtronic Adapta dual-chamber implanted 2015).  He has a history of recurrent persistent atrial fibrillation, hypertension, GERD, diabetes, dual chamber Medtronic pacemaker for SSS. On 07/27/2014 the patient underwent generator change  out. EF has usually been 50-55%, but dropped to 40-45% when he had broad QRS/flecainide toxicity. He did not have ischemia by his most recent nuclear stress test, EF normalized off flecainide. In 2007, cardiac catheterization showed a 40% proximal LAD stenosis, no other significant lesions. He is on chronic warfarin anticoagulation and has not had bleeding complications or stroke. In the past has had successful cardioversion as well as pace-termination of atrial flutter via his pacemaker.  Past Medical History:  Diagnosis Date   Arthritis    Atrial fibrillation ([place])    Barrett's esophagus    CAD (coronary artery disease)    echo 09-05-2010-EF nl, mod-severe dilated Left atrium; myoview 02-08-12-EF 52%, no ischemia   Cancer (HCC)    prostate - radiation only   Diabetes mellitus without complication ([place])    "prediabetic"-no oral meds   GERD (gastroesophageal reflux disease)    Gout    H/O transesophageal echocardiography (TEE) for monitoring 09/05/10   tee guided AT pace termination, did not proceed with cardioversion due to termination of the atrial flutter through his pacemaker   History of cardioversion 02/09/13   electrical synchronized cardioversion, on flecainide and warfarin   History of kidney stones    per pt - no actual diagnosisi   History of stomach ulcers    many yrs ago   HTN (hypertension)    Pacemaker    medtronic   Pacemaker 09-25-13   Prostate cancer ([place]) 09-25-13   tx.  with radiation only- dx. 2013    Past Surgical History:  Procedure Laterality Date   APPENDECTOMY     CARDIAC CATHETERIZATION  02/18/06   EF 65%, focal napkin ring-like lesion into the prox LAD otherwise normal coronaries   CARDIOVERSION N/A 02/09/2013   Procedure: CARDIOVERSION;  Surgeon: Sanda Klein, MD;  Location: [facility];  Service: Cardiovascular;  Laterality: N/A;   CATARACT EXTRACTION W/PHACO Left 12/23/2020   Procedure: CATARACT EXTRACTION PHACO AND INTRAOCULAR LENS PLACEMENT ([place]);   Surgeon: [name], MD;  Location: [facility];  Service: Ophthalmology;  Laterality: Left;  CDE 18.55   CATARACT EXTRACTION W/PHACO Right 01/09/2021   Procedure: CATARACT EXTRACTION PHACO AND INTRAOCULAR LENS PLACEMENT ([facility]);  Surgeon: [name], MD;  Location: [facility];  Service: Ophthalmology;  Laterality: Right;  CDE: 28.65   PACEMAKER GENERATOR CHANGE N/A 07/27/2014   Procedure: PACEMAKER GENERATOR CHANGE;  Surgeon: Sanda Klein, MD;  Location: [facility];  Service: Cardiovascular;  Laterality: N/A;   PACEMAKER INSERTION     medtronic adapta- Dr. Sallyanne Kuster follows- LOV 9'14   TONSILLECTOMY     TOTAL KNEE ARTHROPLASTY Right 10/05/2013   Procedure: RIGHT TOTAL KNEE ARTHROPLASTY;  Surgeon: Gearlean Alf, MD;  Location: [facility];  Service: Orthopedics;  Laterality: Right;    Outpatient Medications Prior to Visit  Medication Sig Dispense Refill   acetaminophen (TYLENOL) 650 MG CR tablet Take 650 mg by mouth every 8 (eight) hours as needed for pain.      Alpha-Lipoic Acid 600 MG CAPS Take 600 mg by mouth daily.     furosemide (LASIX) 20 MG tablet Take 1 tablet (20 mg total) by mouth daily.     loratadine (CLARITIN) 10 MG tablet Take 10 mg by mouth at bedtime.     metFORMIN (GLUCOPHAGE) 500 MG tablet Take by mouth 2 (two) times daily with a meal.     metoprolol succinate (TOPROL-XL) 50 MG 24 hr tablet Take 1 tablet (50 mg total) by mouth daily. Take with or immediately following a meal. 90 tablet 3   Potassium 99 MG TABS Take 1 tablet by mouth daily as needed (cramping).     warfarin (COUMADIN) 2.5 MG tablet Take 1/2 to 1 tablet by mouth daily or as directed by Coumadin clinic 90 tablet 0   calcium-vitamin D (OSCAL WITH D) 500-200 MG-UNIT tablet Take 1 tablet by mouth. (Patient not taking: Reported on 07/03/2021)     Misc Natural Products (TART CHERRY ADVANCED PO) Take 1,200 mg by mouth daily.  (Patient not taking: Reported on 07/03/2021)     Omega-3 Fatty Acids (OMEGA 3 PO) Take 1 capsule by  mouth daily.  (Patient not taking: Reported on 07/03/2021)     SANTYL ointment Apply topically daily.     No facility-administered medications prior to visit.     Allergies:   Shellfish-derived products and Tape   Social History   Socioeconomic History   Marital status: Married    Spouse name: Not on file   Number of children: Not on file   Years of education: Not on file   Highest education level: Not on file  Occupational History   Not on file  Tobacco Use   Smoking status: Former    Types: Cigarettes    Quit date: 07/22/1970    Years since quitting: 50.9   Smokeless tobacco: Never   Tobacco comments:    Quit in the 70s  Substance and Sexual Activity   Alcohol use: No   Drug use:  No   Sexual activity: Not Currently  Other Topics Concern   Not on file  Social History Narrative   Not on file   Social Determinants of Health   Financial Resource Strain: Not on file  Food Insecurity: Not on file  Transportation Needs: Not on file  Physical Activity: Not on file  Stress: Not on file  Social Connections: Not on file     Family History:  The patient's family history includes Diabetes in his brother and mother.   ROS:   Please see the history of present illness.    ROS All other systems are reviewed and are negative.  PHYSICAL EXAM:   VS:  Ht 5' 6.5" (1.689 m)   Wt 175 lb 9.6 oz (79.7 kg)   BMI 27.92 kg/m      General: Alert, oriented x3, no distress, overweight.  Healthy left subclavian pacemaker site Head: no evidence of trauma, PERRL, EOMI, no exophtalmos or lid lag, no myxedema, no xanthelasma; normal ears, Oconnor and oropharynx Neck: normal jugular venous pulsations and no hepatojugular reflux; brisk carotid pulses without delay and no carotid bruits Chest: clear to auscultation, no signs of consolidation by percussion or palpation, normal fremitus, symmetrical and full respiratory excursions Cardiovascular: normal position and quality of the apical impulse,  regular rhythm, normal first and paradoxically split second heart sounds, 1/6 aortic ejection murmur heard at both right and left upper sternal border, musical quality, early peaking; no diastolic murmurs, rubs or gallops Abdomen: no tenderness or distention, no masses by palpation, no abnormal pulsatility or arterial bruits, normal bowel sounds, no hepatosplenomegaly Extremities: no clubbing, cyanosis or edema; 2+ radial, ulnar and brachial pulses bilaterally; 2+ right femoral, posterior tibial and dorsalis pedis pulses; 2+ left femoral, posterior tibial and dorsalis pedis pulses; no subclavian or femoral bruits Neurological: grossly nonfocal Psych: Normal mood and affect    Wt Readings from Last 3 Encounters:  07/03/21 175 lb 9.6 oz (79.7 kg)  01/09/21 182 lb 15.7 oz (83 kg)  12/24/20 185 lb (83.9 kg)      Studies/Labs Reviewed:   EKG: Ordered today, it shows atrial fibrillation with 100% ventricular pacing except for captured beat which is still wide-complex.  Paced QRS is 188 ms and the QTC is 543 ms  Recent Labs: Lipid Panel  No results found for: CHOL, TRIG, HDL, CHOLHDL, VLDL, LDLCALC, LDLDIRECT, LABVLDL  04/26/2021 Cholesterol 152, HDL 45, LDL 92, triglycerides 79 Hemoglobin A1c 6.5% Hemoglobin 13.3, creatinine 1.35, potassium 3.8, ALT 12, TSH 2.5  ASSESSMENT:    1. Permanent atrial fibrillation ([place])   2. Pacemaker   3. Long QT interval   4. Essential hypertension   5. Long term current use of anticoagulant therapy   6. Chronic diastolic heart failure (HCC)      PLAN:  In order of problems listed above:  AFib: Permanent arrhythmia.  He tolerates high frequency ventricular pacing without worsening heart failure.  CHADSVasc score is high, at least 4 (age 3, CHF, HTN), maybe 6 if one counts CAD and borderline diabetes.  Continue anticoagulation. PPM: Normally functioning device.  Dual-chamber device programmed VVIR.  Continue remote downloads every 3 months. Long QT:  Partly related to ventricular paced rhythm but predates this; avoid agents that can prolong the QT interval further. HTN: Fair control.  At home systolic blood pressure usually less than 140. Warfarin: Discussed switching to a DOAC today, he will think about it. CHF: Appears clinically euvolemic on a tiny dose of loop diuretic.  Medication Adjustments/Labs and Tests Ordered: Current medicines are reviewed at length with the patient today.  Concerns regarding medicines are outlined above.  Medication changes, Labs and Tests ordered today are listed in the Patient Instructions below. There are no Patient Instructions on file for this visit.      Signed, Sanda Klein, MD  07/03/2021 8:19 AM    [facility] [address], [place], [place]  [address] Phone: [phone]; Fax: [phone]

## 2021-07-07 ENCOUNTER — Telehealth: Payer: Self-pay | Admitting: Cardiovascular Disease

## 2021-07-07 MED ORDER — APIXABAN 5 MG PO TABS: 5.0000 mg | ORAL_TABLET | Freq: Two times a day (BID) | ORAL | 5 refills | Status: AC

## 2021-07-07 NOTE — Telephone Encounter (Signed)
Eliquis is preferred DOAC given pt's age due to lowest risk of bleeding compared to other DOACs. Pt qualifies for Eliquis '5mg'$  BID based on age 87, weight 80kg, and renal function with SCr 1.35 on 04/26/21. Confirmed copay is $30 a month at his pharmacy. He has not taken warfarin today, he has been advised to stop warfarin and start Eliquis '5mg'$  BID with first dose tomorrow night. Reviewed other differences between the meds as pt will no longer need to monitor intake of Vitamin K, will need to take Eliquis twice daily, and will not need to hold anticoag as long for any upcoming procedures. Canceled upcoming INR check. He was appreciative for the assistance.

## 2021-07-07 NOTE — Telephone Encounter (Signed)
Pharmd pool review needed

## 2021-07-07 NOTE — Telephone Encounter (Signed)
Pt was in to see Dr. Loletha Grayer on Monday 07/03/21,pt  states he spoke to Dr. Loletha Grayer about changing his Elaina Pattee. Pt was advised to take some time to think about it and call back if he wanted to change. Please advise pt further

## 2021-07-07 NOTE — Telephone Encounter (Signed)
Returned call to patient, seen on 07/03/21. Changing from warfarin to DOAC was discussed. He would like to proceed w/this change. Advised will send message to MD and pharmacy team for assistance with this transition and he will be called to discuss

## 2021-08-09 ENCOUNTER — Ambulatory Visit (INDEPENDENT_AMBULATORY_CARE_PROVIDER_SITE_OTHER): Payer: Medicare Other

## 2021-08-09 DIAGNOSIS — I495 Sick sinus syndrome: Secondary | ICD-10-CM

## 2021-08-10 LAB — CUP PACEART REMOTE DEVICE CHECK
Battery Impedance: 458 Ohm
Battery Remaining Longevity: 109 mo
Battery Voltage: 2.77 V
Brady Statistic RV Percent Paced: 94 %
Date Time Interrogation Session: 20221005143439
Implantable Lead Implant Date: 20070417
Implantable Lead Implant Date: 20070417
Implantable Lead Location: 753859
Implantable Lead Location: 753860
Implantable Lead Model: 5092
Implantable Lead Model: 5594
Implantable Pulse Generator Implant Date: 20150922
Lead Channel Impedance Value: 626 Ohm
Lead Channel Impedance Value: 67 Ohm
Lead Channel Pacing Threshold Amplitude: 1 V
Lead Channel Pacing Threshold Pulse Width: 0.4 ms
Lead Channel Setting Pacing Amplitude: 2 V
Lead Channel Setting Pacing Pulse Width: 0.4 ms
Lead Channel Setting Sensing Sensitivity: 5.6 mV

## 2021-08-16 ENCOUNTER — Other Ambulatory Visit: Payer: Self-pay | Admitting: Cardiovascular Disease

## 2021-08-17 NOTE — Progress Notes (Signed)
Remote pacemaker transmission. ,  

## 2021-08-21 ENCOUNTER — Other Ambulatory Visit: Payer: Self-pay | Admitting: Cardiovascular Disease

## 2021-08-21 DIAGNOSIS — I4891 Unspecified atrial fibrillation: Secondary | ICD-10-CM

## 2021-11-08 ENCOUNTER — Ambulatory Visit (INDEPENDENT_AMBULATORY_CARE_PROVIDER_SITE_OTHER): Payer: Medicare Other

## 2021-11-08 DIAGNOSIS — I495 Sick sinus syndrome: Secondary | ICD-10-CM

## 2021-11-08 LAB — CUP PACEART REMOTE DEVICE CHECK
Battery Impedance: 560 Ohm
Battery Remaining Longevity: 100 mo
Battery Voltage: 2.78 V
Brady Statistic RV Percent Paced: 94 %
Date Time Interrogation Session: 20230104131420
Implantable Lead Implant Date: 20070417
Implantable Lead Implant Date: 20070417
Implantable Lead Location: 753859
Implantable Lead Location: 753860
Implantable Lead Model: 5092
Implantable Lead Model: 5594
Implantable Pulse Generator Implant Date: 20150922
Lead Channel Impedance Value: 620 Ohm
Lead Channel Impedance Value: 67 Ohm
Lead Channel Pacing Threshold Amplitude: 0.875 V
Lead Channel Pacing Threshold Pulse Width: 0.4 ms
Lead Channel Setting Pacing Amplitude: 2 V
Lead Channel Setting Pacing Pulse Width: 0.4 ms
Lead Channel Setting Sensing Sensitivity: 5.6 mV

## 2021-11-20 NOTE — Progress Notes (Signed)
Remote pacemaker transmission.   

## 2022-02-03 DEATH — deceased

## 2022-11-07 ENCOUNTER — Ambulatory Visit: Payer: Self-pay
